# Patient Record
Sex: Male | Born: 1945 | Race: White | Hispanic: No | Marital: Married | Smoking: Never smoker
Health system: Southern US, Community
[De-identification: ages and names within clinical notes are randomized; demographics above are authoritative.]

## PROBLEM LIST (undated history)

## (undated) DIAGNOSIS — H409 Unspecified glaucoma: Secondary | ICD-10-CM

## (undated) DIAGNOSIS — E785 Hyperlipidemia, unspecified: Secondary | ICD-10-CM

## (undated) DIAGNOSIS — C801 Malignant (primary) neoplasm, unspecified: Secondary | ICD-10-CM

## (undated) DIAGNOSIS — E119 Type 2 diabetes mellitus without complications: Secondary | ICD-10-CM

## (undated) DIAGNOSIS — E789 Disorder of lipoprotein metabolism, unspecified: Secondary | ICD-10-CM

## (undated) HISTORY — DX: Hyperlipidemia, unspecified: E78.5

## (undated) HISTORY — DX: Disorder of lipoprotein metabolism, unspecified: E78.9

## (undated) HISTORY — DX: Unspecified glaucoma: H40.9

---

## 2008-05-09 ENCOUNTER — Emergency Department (HOSPITAL_COMMUNITY): Admission: EM | Admit: 2008-05-09 | Discharge: 2008-05-09 | Payer: Self-pay | Admitting: Emergency Medicine

## 2009-11-01 ENCOUNTER — Encounter: Admission: RE | Admit: 2009-11-01 | Discharge: 2009-11-01 | Payer: Self-pay | Admitting: Internal Medicine

## 2010-02-17 ENCOUNTER — Encounter: Payer: Self-pay | Admitting: Orthopedic Surgery

## 2010-05-08 LAB — URINALYSIS, ROUTINE W REFLEX MICROSCOPIC
Bilirubin Urine: NEGATIVE
Ketones, ur: NEGATIVE mg/dL
Specific Gravity, Urine: 1.025 (ref 1.005–1.030)
Urobilinogen, UA: 0.2 mg/dL (ref 0.0–1.0)

## 2010-05-08 LAB — POCT I-STAT, CHEM 8
BUN: 22 mg/dL (ref 6–23)
Hemoglobin: 15 g/dL (ref 13.0–17.0)
Potassium: 4.3 mEq/L (ref 3.5–5.1)
Sodium: 135 mEq/L (ref 135–145)
TCO2: 26 mmol/L (ref 0–100)

## 2010-05-08 LAB — URINE MICROSCOPIC-ADD ON

## 2011-08-08 ENCOUNTER — Telehealth: Payer: Self-pay

## 2011-08-08 NOTE — Telephone Encounter (Signed)
[facility] STATES THEY HAVE BEEN TRYING TO GET A REFILL ON PT'S CIALIS, PT WAS TRANSFERRING FROM [facility] TO THEM AND THEY WILL TAKE A VERBAL PLEASE CALL [phone]    [facility] AT [phone]

## 2011-08-08 NOTE — Telephone Encounter (Signed)
Ok to fill Cialis 10 mg #12. Needs office visit - due for physical

## 2011-08-08 NOTE — Telephone Encounter (Signed)
Chart is at nurses station for review 

## 2011-08-09 MED ORDER — TADALAFIL 10 MG PO TABS: 10.0000 mg | ORAL_TABLET | Freq: Every day | ORAL | Status: DC | PRN

## 2011-08-09 NOTE — Telephone Encounter (Signed)
RX sent in

## 2011-11-09 ENCOUNTER — Other Ambulatory Visit: Payer: Self-pay | Admitting: Internal Medicine

## 2011-11-10 NOTE — Telephone Encounter (Signed)
Patients chart is at the nurses station in the pa pool pile.  [facility] [id number]

## 2011-12-29 ENCOUNTER — Encounter: Payer: Self-pay | Admitting: Internal Medicine

## 2012-01-26 ENCOUNTER — Encounter: Payer: Self-pay | Admitting: Internal Medicine

## 2012-02-27 ENCOUNTER — Telehealth: Payer: Self-pay

## 2012-02-27 NOTE — Telephone Encounter (Signed)
Pts wife Jeanella Flattery is calling to request pt's liptitor 20 mg. [facility] [phone]

## 2012-02-28 NOTE — Telephone Encounter (Signed)
The patient hasn't been seen here in more than 1 year, so he needs an office visit.

## 2012-02-28 NOTE — Telephone Encounter (Signed)
Left message to return call 

## 2012-03-01 MED ORDER — ATORVASTATIN CALCIUM 40 MG PO TABS: 40.0000 mg | ORAL_TABLET | Freq: Every day | ORAL | Status: DC

## 2012-03-01 NOTE — Telephone Encounter (Signed)
Patients appt is scheduled for March. Was RS from Dec per Dr Perrin Maltese this is sent in for him. Wife advised.

## 2012-03-29 ENCOUNTER — Encounter: Payer: Self-pay | Admitting: Internal Medicine

## 2012-03-29 ENCOUNTER — Ambulatory Visit (INDEPENDENT_AMBULATORY_CARE_PROVIDER_SITE_OTHER): Payer: Medicare Other | Admitting: Internal Medicine

## 2012-03-29 ENCOUNTER — Ambulatory Visit: Payer: Medicare Other

## 2012-03-29 VITALS — BP 122/81 | HR 70 | Temp 97.6°F | Resp 16 | Ht 67.0 in | Wt 197.0 lb

## 2012-03-29 DIAGNOSIS — R748 Abnormal levels of other serum enzymes: Secondary | ICD-10-CM

## 2012-03-29 DIAGNOSIS — E785 Hyperlipidemia, unspecified: Secondary | ICD-10-CM

## 2012-03-29 DIAGNOSIS — E789 Disorder of lipoprotein metabolism, unspecified: Secondary | ICD-10-CM | POA: Insufficient documentation

## 2012-03-29 DIAGNOSIS — H409 Unspecified glaucoma: Secondary | ICD-10-CM

## 2012-03-29 DIAGNOSIS — R079 Chest pain, unspecified: Secondary | ICD-10-CM

## 2012-03-29 DIAGNOSIS — Z0000 Encounter for general adult medical examination without abnormal findings: Secondary | ICD-10-CM

## 2012-03-29 HISTORY — DX: Disorder of lipoprotein metabolism, unspecified: E78.9

## 2012-03-29 LAB — CBC
MCV: 87.8 fL (ref 78.0–100.0)
Platelets: 254 10*3/uL (ref 150–400)
RDW: 13.3 % (ref 11.5–15.5)
WBC: 6.2 10*3/uL (ref 4.0–10.5)

## 2012-03-29 LAB — COMPREHENSIVE METABOLIC PANEL
ALT: 67 U/L — ABNORMAL HIGH (ref 0–53)
CO2: 26 mEq/L (ref 19–32)
Calcium: 9.8 mg/dL (ref 8.4–10.5)
Chloride: 100 mEq/L (ref 96–112)
Potassium: 4.6 mEq/L (ref 3.5–5.3)
Sodium: 136 mEq/L (ref 135–145)
Total Protein: 6.9 g/dL (ref 6.0–8.3)

## 2012-03-29 LAB — POCT URINALYSIS DIPSTICK
Leukocytes, UA: NEGATIVE
Protein, UA: NEGATIVE
Urobilinogen, UA: 0.2

## 2012-03-29 LAB — LIPID PANEL
LDL Cholesterol: 120 mg/dL — ABNORMAL HIGH (ref 0–99)
Triglycerides: 180 mg/dL — ABNORMAL HIGH (ref <150)

## 2012-03-29 LAB — POCT UA - MICROSCOPIC ONLY
WBC, Ur, HPF, POC: NEGATIVE
Yeast, UA: NEGATIVE

## 2012-03-29 MED ORDER — TADALAFIL 10 MG PO TABS: 10.0000 mg | ORAL_TABLET | Freq: Every day | ORAL | Status: DC | PRN

## 2012-03-29 MED ORDER — ATORVASTATIN CALCIUM 40 MG PO TABS: 40.0000 mg | ORAL_TABLET | Freq: Every day | ORAL | Status: DC

## 2012-03-29 NOTE — Progress Notes (Signed)
  Subjective:    Patient ID: Nathan Mora, male    DOB: [birth year], 67 y.o.   MRN: [id number]  HPI CPE Fhx of MI and migraines Has hyperlipidemia on lipitor   Review of Systems  Constitutional: Negative.   HENT: Negative.   Eyes: Positive for visual disturbance.  Respiratory: Positive for chest tightness.   Cardiovascular: Negative.   Gastrointestinal: Negative.   Genitourinary: Negative.   Musculoskeletal: Negative.   Neurological: Negative.   Hematological: Negative.   Psychiatric/Behavioral: Negative.        Objective:   Physical Exam  Constitutional: He is oriented to person, place, and time. He appears well-developed and well-nourished.  HENT:  Right Ear: External ear normal.  Left Ear: External ear normal.  Nose: Nose normal.  Mouth/Throat: Oropharynx is clear and moist.  Eyes: Conjunctivae and EOM are normal. Pupils are equal, round, and reactive to light. No scleral icterus.  Neck: Normal range of motion. Neck supple. No tracheal deviation present. No thyromegaly present.  Cardiovascular: Normal rate, regular rhythm and normal heart sounds.   No murmur heard. Pulmonary/Chest: Effort normal and breath sounds normal.  Abdominal: Bowel sounds are normal. He exhibits no mass. There is no tenderness.  Genitourinary: Rectum normal, prostate normal and penis normal.  Musculoskeletal: Normal range of motion.  Lymphadenopathy:    He has no cervical adenopathy.  Neurological: He is alert and oriented to person, place, and time. No cranial nerve deficit. He exhibits normal muscle tone. Coordination normal.  Skin: Skin is warm and dry.  Psychiatric: He has a normal mood and affect. His behavior is normal. Judgment and thought content normal.   ekg nl CXR [facility] reading (PRIMARY) by  Dr Perrin Maltese.  Results for orders placed in visit on 03/29/12  POCT URINALYSIS DIPSTICK      Result Value Range   Color, UA yellow     Clarity, UA clear     Glucose, UA neg     Bilirubin, UA  neg     Ketones, UA neg     Spec Grav, UA 1.010     Blood, UA neg     pH, UA 6.0     Protein, UA neg     Urobilinogen, UA 0.2     Nitrite, UA neg     Leukocytes, UA Negative    POCT UA - MICROSCOPIC ONLY      Result Value Range   WBC, Ur, HPF, POC neg     RBC, urine, microscopic neg     Bacteria, U Microscopic neg     Mucus, UA neg     Epithelial cells, urine per micros 0-2     Crystals, Ur, HPF, POC neg     Casts, Ur, LPF, POC neg     Yeast, UA neg            Assessment & Plan:  Chest pressure at night/RF to [facility] cardiology for CAD eval

## 2012-03-29 NOTE — Patient Instructions (Addendum)
DASH Diet The DASH diet stands for "Dietary Approaches to Stop Hypertension." It is a healthy eating plan that has been shown to reduce high blood pressure (hypertension) in as little as 14 days, while also possibly providing other significant health benefits. These other health benefits include reducing the risk of breast cancer after menopause and reducing the risk of type 2 diabetes, heart disease, colon cancer, and stroke. Health benefits also include weight loss and slowing kidney failure in patients with chronic kidney disease.  DIET GUIDELINES  Limit salt (sodium). Your diet should contain less than 1500 mg of sodium daily.  Limit refined or processed carbohydrates. Your diet should include mostly whole grains. Desserts and added sugars should be used sparingly.  Include small amounts of heart-healthy fats. These types of fats include nuts, oils, and tub margarine. Limit saturated and trans fats. These fats have been shown to be harmful in the body. CHOOSING FOODS  The following food groups are based on a 2000 calorie diet. See your Registered Dietitian for individual calorie needs. Grains and Grain Products (6 to 8 servings daily)  Eat More Often: Whole-wheat bread, brown rice, whole-grain or wheat pasta, quinoa, popcorn without added fat or salt (air popped).  Eat Less Often: White bread, white pasta, white rice, cornbread. Vegetables (4 to 5 servings daily)  Eat More Often: Fresh, frozen, and canned vegetables. Vegetables may be raw, steamed, roasted, or grilled with a minimal amount of fat.  Eat Less Often/Avoid: Creamed or fried vegetables. Vegetables in a cheese sauce. Fruit (4 to 5 servings daily)  Eat More Often: All fresh, canned (in natural juice), or frozen fruits. Dried fruits without added sugar. One hundred percent fruit juice ( cup [237 mL] daily).  Eat Less Often: Dried fruits with added sugar. Canned fruit in light or heavy syrup. [employer], Fish, and Poultry (2  servings or less daily. One serving is 3 to 4 oz [85-114 g]).  Eat More Often: Ninety percent or leaner ground beef, tenderloin, sirloin. Round cuts of beef, chicken breast, [place] breast. All fish. Grill, bake, or broil your meat. Nothing should be fried.  Eat Less Often/Avoid: Fatty cuts of meat, [place], or chicken leg, thigh, or wing. Fried cuts of meat or fish. Dairy (2 to 3 servings)  Eat More Often: Low-fat or fat-free milk, low-fat plain or light yogurt, reduced-fat or part-skim cheese.  Eat Less Often/Avoid: Milk (whole, 2%).Whole milk yogurt. Full-fat cheeses. Nuts, Seeds, and Legumes (4 to 5 servings per week)  Eat More Often: All without added salt.  Eat Less Often/Avoid: Salted nuts and seeds, canned beans with added salt. Fats and Sweets (limited)  Eat More Often: Vegetable oils, tub margarines without trans fats, sugar-free gelatin. Mayonnaise and salad dressings.  Eat Less Often/Avoid: Coconut oils, palm oils, butter, stick margarine, cream, half and half, cookies, candy, pie. FOR MORE INFORMATION The Dash Diet Eating Plan: www.dashdiet.org Document Released: 01/02/2011 Document Revised: 04/07/2011 Document Reviewed: 01/02/2011 [facility] Patient Information 2013 [place], [place]. Coronary Artery Disease, Risk Factors Research has shown that the risk of developing coronary artery disease (CAD) and having a heart attack increases with each factor you have. RISK FACTORS YOU CANNOT CHANGE  Your age. Your risk goes up as you get older. Most heart attacks happen to people over the age of 52.  Gender. Men have a greater risk of heart attack than women, and they have attacks earlier in life. However, women are more likely to die from a heart attack.  Heredity. Children  of parents with heart disease are more likely to develop it themselves.  Race. African Americans and other ethnic groups have a higher risk, possibly because of high blood pressure, a tendency toward obesity,  and diabetes.  Your family. Most people with a strong family history of heart disease have one or more other risk factors. RISK FACTORS YOU CAN CHANGE  Exposure to tobacco smoke. Even secondhand smoke greatly increases the risk for heart disease.  High blood cholesterol may be lowered with changes in diet, activity, and medicines.  High blood pressure makes the heart work harder. This causes the heart muscles to become thick and, eventually, weaker. It also increases your risk of stroke, heart attack, and kidney or heart failure.  Physical inactivity is a risk factor for CAD. Regular physical activity helps prevent heart and blood vessel disease. Exercise helps control blood cholesterol, diabetes, obesity, and it may help lower blood pressure in some people.  Excess body fat, especially belly fat, increases the risk of heart disease and stroke even if there are no other risk factors. Excess weight increases the heart's workload and raises blood pressure and blood cholesterol.  Diabetes seriously increases your risk of developing CAD. If you have diabetes, you should work with your caregiver to manage it and control other risk factors. OTHER RISK FACTORS FOR CAD  How you respond to stress.  Drinking too much alcohol may raise blood pressure, cause heart failure, and lead to stroke.  Total cholesterol greater than 200 milligrams.  HDL (good) cholesterol less than 40 milligrams. HDL helps keep cholesterol from building up in the walls of the arteries. PREVENTING CAD  Maintain a healthy weight.  Exercise or do physical activity.  Eat a heart-healthy diet low in fat and salt and high in fiber.  Control your blood pressure to keep it below 120 over 80.  Keep your cholesterol at a level that lowers your risk.  Manage diabetes if you have it.  Stop smoking.  Learn how to manage stress. HEART SMART SUBSTITUTIONS  Instead of whole or 2% milk and cream, use skim milk.  Instead of  fried foods, eat baked, steamed, boiled, broiled, or microwaved foods.  Instead of lard, butter, palm and coconut oils, cook with unsaturated vegetable oils, such as corn, olive, canola, safflower, sesame, soybean, sunflower, or peanut.  Instead of fatty cuts of meat, eat lean cuts of meat or cut off the fatty parts.  Instead of 1 whole egg in recipes, use 2 egg whites.  Instead of sauces, butter, and salt, season vegetables with herbs and spices.  Instead of regular hard and processed cheeses, eat low-fat, low-sodium cheeses.  Instead of salted potato chips, choose low-fat, unsalted tortilla and potato chips and unsalted pretzels and popcorn.  Instead of sour cream and mayonnaise, use plain low-fat yogurt, low-fat cottage cheese, or low-fat or "light" sour cream. FOR MORE INFORMATION  National Heart Lung and Blood Institute: https://nielsen.com/ American Heart Association: PopSteam.is Document Released: 04/05/2003 Document Revised: 04/07/2011 Document Reviewed: 03/31/2007 [facility] Patient Information 2013 [place], [place].

## 2012-03-30 LAB — HEPATITIS C ANTIBODY: HCV Ab: NEGATIVE

## 2012-04-11 ENCOUNTER — Encounter: Payer: Self-pay | Admitting: *Deleted

## 2012-04-22 ENCOUNTER — Encounter: Payer: Self-pay | Admitting: Cardiovascular Disease

## 2012-04-22 ENCOUNTER — Encounter: Payer: Self-pay | Admitting: Internal Medicine

## 2012-04-22 ENCOUNTER — Encounter: Payer: Self-pay | Admitting: *Deleted

## 2012-04-22 DIAGNOSIS — H409 Unspecified glaucoma: Secondary | ICD-10-CM | POA: Insufficient documentation

## 2012-04-22 DIAGNOSIS — E785 Hyperlipidemia, unspecified: Secondary | ICD-10-CM | POA: Insufficient documentation

## 2012-04-23 ENCOUNTER — Encounter: Payer: Self-pay | Admitting: Cardiovascular Disease

## 2012-04-23 ENCOUNTER — Ambulatory Visit (INDEPENDENT_AMBULATORY_CARE_PROVIDER_SITE_OTHER)
Admission: RE | Admit: 2012-04-23 | Discharge: 2012-04-23 | Disposition: A | Discharge status: Home / Self Care | Payer: Self-pay | Source: Ambulatory Visit | Attending: Cardiovascular Disease | Admitting: Cardiovascular Disease

## 2012-04-23 ENCOUNTER — Ambulatory Visit (INDEPENDENT_AMBULATORY_CARE_PROVIDER_SITE_OTHER): Payer: Self-pay | Admitting: Cardiovascular Disease

## 2012-04-23 VITALS — BP 131/72 | HR 66 | Wt 197.0 lb

## 2012-04-23 DIAGNOSIS — Z8249 Family history of ischemic heart disease and other diseases of the circulatory system: Secondary | ICD-10-CM

## 2012-04-23 NOTE — Progress Notes (Signed)
Patient ID: Nathan Mora, male   DOB: 1945/04/05, 67 y.o.   MRN: [id number] 67 yo with elevated lipids and family history of CAD.  Referred by Dr Perrin Maltese. Last 6 months some dyspnea at night when he lays down. No exertional dyspnea with exercise. No chest pain. No history of CAD.  Last LDL 121 3/14 on 40 mg of lipitor.  Has not had a stress test.  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    ROS: Denies fever, malais, weight loss, blurry vision, decreased visual acuity, cough, sputum, SOB, hemoptysis, pleuritic pain, palpitaitons, heartburn, abdominal pain, melena, lower extremity edema, claudication, or rash.  All other systems reviewed and negative   General: Affect appropriate Healthy:  appears stated age HEENT: normal Neck supple with no adenopathy JVP normal no bruits no thyromegaly Lungs clear with no wheezing and good diaphragmatic motion Heart:  S1/S2 no murmur,rub, gallop or click PMI normal Abdomen: benighn, BS positve, no tenderness, no AAA no bruit.  No HSM or HJR Distal pulses intact with no bruits No edema Neuro non-focal Skin warm and dry No muscular weakness  Medications Current Outpatient Prescriptions  Medication Sig Dispense Refill  . atorvastatin (LIPITOR) 40 MG tablet Take 1 tablet (40 mg total) by mouth daily. Needs office visit  90 tablet  3  . brimonidine-timolol (COMBIGAN) 0.2-0.5 % ophthalmic solution Place 1 drop into both eyes every 12 (twelve) hours.      Marland Kitchen latanoprost (XALATAN) 0.005 % ophthalmic solution 1 drop as directed.      . tadalafil (CIALIS) 10 MG tablet Take 1 tablet (10 mg total) by mouth daily as needed for erectile dysfunction.  20 tablet  3   No current facility-administered medications for this visit.    Allergies Review of patient's allergies indicates no known allergies.  Family History: Family History  Problem Relation Age  of Onset  . Cancer Father     lung  . Heart attack Brother   . Hypertension Brother   . Hypertension Brother     Social History: History   Social History  . Marital Status: Married    Spouse Name: N/A    Number of Children: N/A  . Years of Education: N/A   Occupational History  . Not on file.   Social History Main Topics  . Smoking status: Never Smoker   . Smokeless tobacco: Not on file  . Alcohol Use: Yes  . Drug Use: No  . Sexually Active: Yes   Other Topics Concern  . Not on file   Social History Narrative  . No narrative on file    Electrocardiogram:  04/22/12  NSR rate 62 normal   Assessment and Plan

## 2012-04-23 NOTE — Assessment & Plan Note (Signed)
Calcium score today If 0 no change in Rx.  If positive will change cholesterol meds and do ETT

## 2012-04-23 NOTE — Assessment & Plan Note (Signed)
Should change to crestor or add zetia to lipitor if calcium score not zero.  Target LDL less than 100 if CAD present and would not use 80 mg of lipitor due to side effect profile

## 2012-04-23 NOTE — Patient Instructions (Signed)
Your physician recommends that you schedule a follow-up appointment in: PENDING CALCIUM SCORE Your physician recommends that you continue on your current medications as directed. Please refer to the Current Medication list given to you today.  CALCIUM SCORE  TODAY

## 2012-11-29 DIAGNOSIS — H401130 Primary open-angle glaucoma, bilateral, stage unspecified: Secondary | ICD-10-CM | POA: Insufficient documentation

## 2012-11-29 DIAGNOSIS — H02839 Dermatochalasis of unspecified eye, unspecified eyelid: Secondary | ICD-10-CM | POA: Insufficient documentation

## 2013-03-24 ENCOUNTER — Other Ambulatory Visit: Payer: Self-pay | Admitting: Internal Medicine

## 2013-09-28 ENCOUNTER — Telehealth: Payer: Self-pay | Admitting: *Deleted

## 2013-09-28 NOTE — Telephone Encounter (Signed)
Phoned patient to schedule Annual Medicare Wellness Exam [facility]) and since patient was already previously advised that Dr. Elder Cyphers is no longer seeing SCHEDULED patients, he is requesting that Dr. Elder Cyphers provide a provider recommendation-who within [facility], that is taking new patients, would Dr. Elder Cyphers recommend.  Advised patient I would send request to Dr. Elder Cyphers & get back in contact with him to schedule [facility] as soon as recommendation received.  Please advise.

## 2013-10-04 NOTE — Telephone Encounter (Signed)
Have him call me , he is a personal friend.

## 2013-10-04 NOTE — Telephone Encounter (Signed)
Good afternoon.  I hope you had a relaxing Labor day weekend...  I just wanted to touch base and follow up on previous message regarding Nathan Mora request for a PCP recommendation from you.  Thank you & I look forward to hearing from you.

## 2013-10-05 NOTE — Telephone Encounter (Signed)
Gave pt cell phone number. Pt states he will call as soon as possible.

## 2013-10-06 NOTE — Telephone Encounter (Signed)
Dr. Elder Cyphers- please advise if pt was to schedule an appt with another provider or if he will be coming into the clinic during your office hours.  Hinton Dyer- Absolutely!

## 2013-10-06 NOTE — Telephone Encounter (Signed)
Nathan Mora, if you don't mind, please let me know if patient comes in to be seen and has his wellness exam so I can close his PAF (healthcare gaps).  Thanks!

## 2013-10-11 ENCOUNTER — Ambulatory Visit (INDEPENDENT_AMBULATORY_CARE_PROVIDER_SITE_OTHER): Payer: Medicare HMO | Admitting: Internal Medicine

## 2013-10-11 VITALS — BP 110/68 | HR 56 | Temp 97.9°F | Resp 16 | Ht 67.0 in | Wt 183.2 lb

## 2013-10-11 DIAGNOSIS — Z23 Encounter for immunization: Secondary | ICD-10-CM

## 2013-10-11 DIAGNOSIS — Z7189 Other specified counseling: Secondary | ICD-10-CM

## 2013-10-11 DIAGNOSIS — H409 Unspecified glaucoma: Secondary | ICD-10-CM

## 2013-10-11 DIAGNOSIS — Z0000 Encounter for general adult medical examination without abnormal findings: Secondary | ICD-10-CM

## 2013-10-11 DIAGNOSIS — E785 Hyperlipidemia, unspecified: Secondary | ICD-10-CM

## 2013-10-11 DIAGNOSIS — N529 Male erectile dysfunction, unspecified: Secondary | ICD-10-CM

## 2013-10-11 LAB — COMPLETE METABOLIC PANEL WITH GFR
ALBUMIN: 4.6 g/dL (ref 3.5–5.2)
ALT: 34 U/L (ref 0–53)
AST: 24 U/L (ref 0–37)
Alkaline Phosphatase: 54 U/L (ref 39–117)
BUN: 18 mg/dL (ref 6–23)
CALCIUM: 9.2 mg/dL (ref 8.4–10.5)
CHLORIDE: 104 meq/L (ref 96–112)
CO2: 29 meq/L (ref 19–32)
CREATININE: 0.81 mg/dL (ref 0.50–1.35)
GFR, Est African American: >89 mL/min
GFR, Est Non African American: >89 mL/min
Glucose, Bld: 100 mg/dL — ABNORMAL HIGH (ref 70–99)
POTASSIUM: 4.6 meq/L (ref 3.5–5.3)
SODIUM: 139 meq/L (ref 135–145)
TOTAL PROTEIN: 6.5 g/dL (ref 6.0–8.3)
Total Bilirubin: 1.2 mg/dL (ref 0.2–1.2)

## 2013-10-11 LAB — LIPID PANEL
CHOL/HDL RATIO: 3.1 ratio
CHOLESTEROL: 175 mg/dL (ref 0–200)
HDL: 57 mg/dL (ref >39)
LDL CALC: 106 mg/dL — AB (ref 0–99)
Triglycerides: 61 mg/dL (ref <150)
VLDL: 12 mg/dL (ref 0–40)

## 2013-10-11 LAB — POCT CBC
GRANULOCYTE PERCENT: 59.2 % (ref 37–80)
HCT, POC: 46 % (ref 43.5–53.7)
Hemoglobin: 15.2 g/dL (ref 14.1–18.1)
LYMPH, POC: 2.4 (ref 0.6–3.4)
MCH, POC: 31.2 pg (ref 27–31.2)
MCHC: 33.1 g/dL (ref 31.8–35.4)
MCV: 94.1 fL (ref 80–97)
MID (cbc): 0.2 (ref 0–0.9)
MPV: 8.1 fL (ref 0–99.8)
POC GRANULOCYTE: 3.8 (ref 2–6.9)
POC LYMPH %: 37.6 % (ref 10–50)
POC MID %: 3.2 % (ref 0–12)
Platelet Count, POC: 207 10*3/uL (ref 142–424)
RBC: 4.89 M/uL (ref 4.69–6.13)
RDW, POC: 13.4 %
WBC: 6.4 10*3/uL (ref 4.6–10.2)

## 2013-10-11 LAB — POCT URINALYSIS DIPSTICK
Bilirubin, UA: NEGATIVE
Blood, UA: NEGATIVE
Glucose, UA: NEGATIVE
Ketones, UA: NEGATIVE
LEUKOCYTES UA: NEGATIVE
NITRITE UA: NEGATIVE
PH UA: 6
PROTEIN UA: NEGATIVE
Spec Grav, UA: 1.025
UROBILINOGEN UA: 1

## 2013-10-11 LAB — POCT UA - MICROSCOPIC ONLY
Bacteria, U Microscopic: NEGATIVE
CASTS, UR, LPF, POC: NEGATIVE
Crystals, Ur, HPF, POC: NEGATIVE
RBC, urine, microscopic: NEGATIVE
WBC, UR, HPF, POC: NEGATIVE
YEAST UA: NEGATIVE

## 2013-10-11 LAB — TSH: TSH: 0.997 u[IU]/mL (ref 0.350–4.500)

## 2013-10-11 MED ORDER — ATORVASTATIN CALCIUM 40 MG PO TABS: 40.0000 mg | ORAL_TABLET | Freq: Every day | ORAL | Status: DC

## 2013-10-11 MED ORDER — TADALAFIL 10 MG PO TABS: 10.0000 mg | ORAL_TABLET | Freq: Every day | ORAL | Status: DC | PRN

## 2013-10-11 NOTE — Progress Notes (Signed)
   Subjective:    Patient ID: Nathan Mora, male    DOB: [birth year], 68 y.o.   MRN: [id number]  HPI  68 year old caucasian male presents for complete physical exam today with Dr. Lou Miner. Pt states he does not need surgery for glaucoma/ eyes per specialist he has seen. Last colonoscopy approximately 10 years ago and pt is refusing to have another.  Lipids are controlled with lipitor. Has lost 20lbs with diet and exercise.     Review of Systems  HENT: Negative.   Eyes: Positive for visual disturbance.  Respiratory: Negative.   Cardiovascular: Negative.   Gastrointestinal: Negative.   Endocrine: Negative.   Genitourinary: Negative.   Musculoskeletal: Negative.   Skin: Negative.   Allergic/Immunologic: Negative.   Neurological: Negative.   Hematological: Negative.   Psychiatric/Behavioral: Negative.        Objective:   Physical Exam  Constitutional: He is oriented to person, place, and time. He appears well-developed and well-nourished. No distress.  HENT:  Head: Normocephalic and atraumatic.  Right Ear: External ear normal.  Left Ear: External ear normal.  Nose: Nose normal.  Mouth/Throat: Oropharynx is clear and moist.  Eyes: Conjunctivae and EOM are normal. Pupils are equal, round, and reactive to light. Right eye exhibits no discharge. No scleral icterus.  Neck: Normal range of motion. Neck supple. No tracheal deviation present. No thyromegaly present.  Cardiovascular: Normal rate, regular rhythm, normal heart sounds and intact distal pulses.  Exam reveals no gallop.   No murmur heard. Pulmonary/Chest: Effort normal and breath sounds normal.  Abdominal: Soft. Bowel sounds are normal. He exhibits no mass. There is no tenderness.  Genitourinary: Rectum normal, prostate normal and penis normal. No penile tenderness.  Musculoskeletal: Normal range of motion. He exhibits no edema and no tenderness.  Neurological: He is alert and oriented to person, place, and time. He has  normal reflexes. No cranial nerve deficit. He exhibits normal muscle tone. Coordination normal.  Skin: No rash noted.  Psychiatric: He has a normal mood and affect. His behavior is normal. Judgment and thought content normal.   EKG normal       Assessment & Plan:  Flu vaccine and Prevnar vaccine RF lipitor

## 2013-10-11 NOTE — Patient Instructions (Addendum)
Calorie Counting for Weight Loss Calories are energy you get from the things you eat and drink. Your body uses this energy to keep you going throughout the day. The number of calories you eat affects your weight. When you eat more calories than your body needs, your body stores the extra calories as fat. When you eat fewer calories than your body needs, your body burns fat to get the energy it needs. Calorie counting means keeping track of how many calories you eat and drink each day. If you make sure to eat fewer calories than your body needs, you should lose weight. In order for calorie counting to work, you will need to eat the number of calories that are right for you in a day to lose a healthy amount of weight per week. A healthy amount of weight to lose per week is usually 1-2 lb (0.5-0.9 kg). A dietitian can determine how many calories you need in a day and give you suggestions on how to reach your calorie goal.  WHAT IS MY MY PLAN? My goal is to have __________ calories per day.  If I have this many calories per day, I should lose around __________ pounds per week. WHAT DO I NEED TO KNOW ABOUT CALORIE COUNTING? In order to meet your daily calorie goal, you will need to:  Find out how many calories are in each food you would like to eat. Try to do this before you eat.  Decide how much of the food you can eat.  Write down what you ate and how many calories it had. Doing this is called keeping a food log. WHERE DO I FIND CALORIE INFORMATION? The number of calories in a food can be found on a Nutrition Facts label. Note that all the information on a label is based on a specific serving of the food. If a food does not have a Nutrition Facts label, try to look up the calories online or ask your dietitian for help. HOW DO I DECIDE HOW MUCH TO EAT? To decide how much of the food you can eat, you will need to consider both the number of calories in one serving and the size of one serving. This  information can be found on the Nutrition Facts label. If a food does not have a Nutrition Facts label, look up the information online or ask your dietitian for help. Remember that calories are listed per serving. If you choose to have more than one serving of a food, you will have to multiply the calories per serving by the amount of servings you plan to eat. For example, the label on a package of bread might say that a serving size is 1 slice and that there are 90 calories in a serving. If you eat 1 slice, you will have eaten 90 calories. If you eat 2 slices, you will have eaten 180 calories. HOW DO I KEEP A FOOD LOG? After each meal, record the following information in your food log:  What you ate.  How much of it you ate.  How many calories it had.  Then, add up your calories. Keep your food log near you, such as in a small notebook in your pocket. Another option is to use a mobile app or website. Some programs will calculate calories for you and show you how many calories you have left each time you add an item to the log. WHAT ARE SOME CALORIE COUNTING TIPS?  Use your calories on foods  and drinks that will fill you up and not leave you hungry. Some examples of this include foods like nuts and nut butters, vegetables, lean proteins, and high-fiber foods (more than 5 g fiber per serving).  Eat nutritious foods and avoid empty calories. Empty calories are calories you get from foods or beverages that do not have many nutrients, such as candy and soda. It is better to have a nutritious high-calorie food (such as an avocado) than a food with few nutrients (such as a bag of chips).  Know how many calories are in the foods you eat most often. This way, you do not have to look up how many calories they have each time you eat them.  Look out for foods that may seem like low-calorie foods but are really high-calorie foods, such as baked goods, soda, and fat-free candy.  Pay attention to calories  in drinks. Drinks such as sodas, specialty coffee drinks, alcohol, and juices have a lot of calories yet do not fill you up. Choose low-calorie drinks like water and diet drinks.  Focus your calorie counting efforts on higher calorie items. Logging the calories in a garden salad that contains only vegetables is less important than calculating the calories in a milk shake.  Find a way of tracking calories that works for you. Get creative. Most people who are successful find ways to keep track of how much they eat in a day, even if they do not count every calorie. WHAT ARE SOME PORTION CONTROL TIPS?  Know how many calories are in a serving. This will help you know how many servings of a certain food you can have.  Use a measuring cup to measure serving sizes. This is helpful when you start out. With time, you will be able to estimate serving sizes for some foods.  Take some time to put servings of different foods on your favorite plates, bowls, and cups so you know what a serving looks like.  Try not to eat straight from a bag or box. Doing this can lead to overeating. Put the amount you would like to eat in a cup or on a plate to make sure you are eating the right portion.  Use smaller plates, glasses, and bowls to prevent overeating. This is a quick and easy way to practice portion control. If your plate is smaller, less food can fit on it.  Try not to multitask while eating, such as watching TV or using your computer. If it is time to eat, sit down at a table and enjoy your food. Doing this will help you to start recognizing when you are full. It will also make you more aware of what and how much you are eating. HOW CAN I CALORIE COUNT WHEN EATING OUT?  Ask for smaller portion sizes or child-sized portions.  Consider sharing an entree and sides instead of getting your own entree.  If you get your own entree, eat only half. Ask for a box at the beginning of your meal and put the rest of your  entree in it so you are not tempted to eat it.  Look for the calories on the menu. If calories are listed, choose the lower calorie options.  Choose dishes that include vegetables, fruits, whole grains, low-fat dairy products, and lean protein. Focusing on smart food choices from each of the 5 food groups can help you stay on track at restaurants.  Choose items that are boiled, broiled, grilled, or steamed.  Choose  water, milk, unsweetened iced tea, or other drinks without added sugars. If you want an alcoholic beverage, choose a lower calorie option. For example, a regular margarita can have up to 700 calories and a glass of wine has around 150.  Stay away from items that are buttered, battered, fried, or served with cream sauce. Items labeled "crispy" are usually fried, unless stated otherwise.  Ask for dressings, sauces, and syrups on the side. These are usually very high in calories, so do not eat much of them.  Watch out for salads. Many people think salads are a healthy option, but this is often not the case. Many salads come with bacon, fried chicken, lots of cheese, fried chips, and dressing. All of these items have a lot of calories. If you want a salad, choose a garden salad and ask for grilled meats or steak. Ask for the dressing on the side, or ask for olive oil and vinegar or lemon to use as dressing.  Estimate how many servings of a food you are given. For example, a serving of cooked rice is  cup or about the size of half a tennis ball or one cupcake wrapper. Knowing serving sizes will help you be aware of how much food you are eating at restaurants. The list below tells you how big or small some common portion sizes are based on everyday objects.  1 oz--4 stacked dice.  3 oz--1 deck of cards.  1 tsp--1 dice.  1 Tbsp-- a Ping-Pong ball.  2 Tbsp--1 Ping-Pong ball.   cup--1 tennis ball or 1 cupcake wrapper.  1 cup--1 baseball. Document Released: 01/13/2005 Document  Revised: 05/30/2013 Document Reviewed: 11/18/2012 [facility] Patient Information 2015 [place], [place]. This information is not intended to replace advice given to you by your health care provider. Make sure you discuss any questions you have with your health care provider. Immunization Schedule, Adult  Influenza vaccine.  All adults should be immunized every year.  All adults, including pregnant women and people with hives-only allergy to eggs can receive the inactivated influenza (IIV) vaccine.  Adults aged 18-49 years can receive the recombinant influenza (RIV) vaccine. The RIV vaccine does not contain any egg protein.  Adults aged 8 years or older can receive the standard-dose IIV or the high-dose IIV.  Tetanus, diphtheria, and acellular pertussis (Td, Tdap) vaccine.  Pregnant women should receive 1 dose of Tdap vaccine during each pregnancy. The dose should be obtained regardless of the length of time since the last dose. Immunization is preferred during the 27th to 36th week of gestation.  An adult who has not previously received Tdap or who does not know his or her vaccine status should receive 1 dose of Tdap. This initial dose should be followed by tetanus and diphtheria toxoids (Td) booster doses every 10 years.  Adults with an unknown or incomplete history of completing a 3-dose immunization series with Td-containing vaccines should begin or complete a primary immunization series including a Tdap dose.  Adults should receive a Td booster every 10 years.  Varicella vaccine.  An adult without evidence of immunity to varicella should receive 2 doses or a second dose if he or she has previously received 1 dose.  Pregnant females who do not have evidence of immunity should receive the first dose after pregnancy. This first dose should be obtained before leaving the health care facility. The second dose should be obtained 4-8 weeks after the first dose.  Human papillomavirus (HPV)  vaccine.  Females aged  13-26 years who have not received the vaccine previously should obtain the 3-dose series.  The vaccine is not recommended for use in pregnant females. However, pregnancy testing is not needed before receiving a dose. If a male is found to be pregnant after receiving a dose, no treatment is needed. In that case, the remaining doses should be delayed until after the pregnancy.  Males aged 25-21 years who have not received the vaccine previously should receive the 3-dose series. Males aged 22-26 years may be immunized.  Immunization is recommended through the age of 74 years for any male who has sex with males and did not get any or all doses earlier.  Immunization is recommended for any person with an immunocompromised condition through the age of 43 years if he or she did not get any or all doses earlier.  During the 3-dose series, the second dose should be obtained 4-8 weeks after the first dose. The third dose should be obtained 24 weeks after the first dose and 16 weeks after the second dose.  Zoster vaccine.  One dose is recommended for adults aged 15 years or older unless certain conditions are present.  Measles, mumps, and rubella (MMR) vaccine.  Adults born before [birth year] generally are considered immune to measles and mumps.  Adults born in [birth year] or later should have 1 or more doses of MMR vaccine unless there is a contraindication to the vaccine or there is laboratory evidence of immunity to each of the three diseases.  A routine second dose of MMR vaccine should be obtained at least 28 days after the first dose for students attending postsecondary schools, health care workers, or international travelers.  People who received inactivated measles vaccine or an unknown type of measles vaccine during 1963-1967 should receive 2 doses of MMR vaccine.  People who received inactivated mumps vaccine or an unknown type of mumps vaccine before 1979 and are at high risk  for mumps infection should consider immunization with 2 doses of MMR vaccine.  For females of childbearing age, rubella immunity should be determined. If there is no evidence of immunity, females who are not pregnant should be vaccinated. If there is no evidence of immunity, females who are pregnant should delay immunization until after pregnancy.  Unvaccinated health care workers born before [birth year] who lack laboratory evidence of measles, mumps, or rubella immunity or laboratory confirmation of disease should consider measles and mumps immunization with 2 doses of MMR vaccine or rubella immunization with 1 dose of MMR vaccine.  Pneumococcal 13-valent conjugate (PCV13) vaccine.  When indicated, a person who is uncertain of his or her immunization history and has no record of immunization should receive the PCV13 vaccine.  An adult aged 3 years or older who has certain medical conditions and has not been previously immunized should receive 1 dose of PCV13 vaccine. This PCV13 should be followed with a dose of pneumococcal polysaccharide (PPSV23) vaccine. The PPSV23 vaccine dose should be obtained at least 8 weeks after the dose of PCV13 vaccine.  An adult aged 55 years or older who has certain medical conditions and previously received 1 or more doses of PPSV23 vaccine should receive 1 dose of PCV13. The PCV13 vaccine dose should be obtained 1 or more years after the last PPSV23 vaccine dose.  Pneumococcal polysaccharide (PPSV23) vaccine.  When PCV13 is also indicated, PCV13 should be obtained first.  All adults aged 60 years and older should be immunized.  An adult younger than age 26 years who  has certain medical conditions should be immunized.  Any person who resides in a nursing home or long-term care facility should be immunized.  An adult smoker should be immunized.  People with an immunocompromised condition and certain other conditions should receive both PCV13 and PPSV23  vaccines.  People with human immunodeficiency virus (HIV) infection should be immunized as soon as possible after diagnosis.  Immunization during chemotherapy or radiation therapy should be avoided.  Routine use of PPSV23 vaccine is not recommended for American Indians, [place] Natives, or people younger than 65 years unless there are medical conditions that require PPSV23 vaccine.  When indicated, people who have unknown immunization and have no record of immunization should receive PPSV23 vaccine.  One-time revaccination 5 years after the first dose of PPSV23 is recommended for people aged 19-64 years who have chronic kidney failure, nephrotic syndrome, asplenia, or immunocompromised conditions.  People who received 1-2 doses of PPSV23 before age 87 years should receive another dose of PPSV23 vaccine at age 34 years or later if at least 5 years have passed since the previous dose.  Doses of PPSV23 are not needed for people immunized with PPSV23 at or after age 67 years.  Meningococcal vaccine.  Adults with asplenia or persistent complement component deficiencies should receive 2 doses of quadrivalent meningococcal conjugate (MenACWY-D) vaccine. The doses should be obtained at least 2 months apart.  Microbiologists working with certain meningococcal bacteria, [employer] recruits, people at risk during an outbreak, and people who travel to or live in countries with a high rate of meningitis should be immunized.  A first-year college student up through age 35 years who is living in a residence hall should receive a dose if he or she did not receive a dose on or after his or her 16th birthday.  Adults who have certain high-risk conditions should receive one or more doses of vaccine.  Hepatitis A vaccine.  Adults who wish to be protected from this disease, have certain high-risk conditions, work with hepatitis A-infected animals, work in hepatitis A research labs, or travel to or work in  countries with a high rate of hepatitis A should be immunized.  Adults who were previously unvaccinated and who anticipate close contact with an international adoptee during the first 60 days after arrival in the [place] from a country with a high rate of hepatitis A should be immunized.  Hepatitis B vaccine.  Adults who wish to be protected from this disease, have certain high-risk conditions, may be exposed to blood or other infectious body fluids, are household contacts or sex partners of hepatitis B positive people, are clients or workers in certain care facilities, or travel to or work in countries with a high rate of hepatitis B should be immunized.  Haemophilus influenzae type b (Hib) vaccine.  A previously unvaccinated person with asplenia or sickle cell disease or having a scheduled splenectomy should receive 1 dose of Hib vaccine.  Regardless of previous immunization, a recipient of a hematopoietic stem cell transplant should receive a 3-dose series 6-12 months after his or her successful transplant.  Hib vaccine is not recommended for adults with HIV infection. Document Released: 04/05/2003 Document Revised: 05/10/2012 Document Reviewed: 03/02/2012 [facility] Patient Information 2015 [place], [place]. This information is not intended to replace advice given to you by your health care provider. Make sure you discuss any questions you have with your health care provider.

## 2013-10-12 LAB — PSA, MEDICARE: PSA: 1.99 ng/mL (ref <=4.00)

## 2013-10-12 NOTE — Telephone Encounter (Signed)
I see that Mr. 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 Elder Cyphers to please close encounter (I realize it was just yesterday, by end of week will be great).  Thank you! Hinton Dyer

## 2013-10-17 NOTE — Telephone Encounter (Signed)
Phoned Mr. Fan regarding FOBT cards & he is planning to bring them by the office either tomorrow or by the end of this week.

## 2013-10-19 LAB — IFOBT (OCCULT BLOOD): IMMUNOLOGICAL FECAL OCCULT BLOOD TEST: NEGATIVE

## 2013-10-24 NOTE — Telephone Encounter (Signed)
FOBT cards received at the walk in clinic, processed & results in EMR labs tab.

## 2015-05-31 ENCOUNTER — Ambulatory Visit (INDEPENDENT_AMBULATORY_CARE_PROVIDER_SITE_OTHER): Payer: Medicare HMO

## 2015-05-31 ENCOUNTER — Encounter: Payer: Self-pay | Admitting: Podiatry

## 2015-05-31 ENCOUNTER — Ambulatory Visit (INDEPENDENT_AMBULATORY_CARE_PROVIDER_SITE_OTHER): Payer: Medicare HMO | Admitting: Podiatry

## 2015-05-31 VITALS — BP 135/80 | HR 52 | Resp 16

## 2015-05-31 DIAGNOSIS — B351 Tinea unguium: Secondary | ICD-10-CM | POA: Diagnosis not present

## 2015-05-31 DIAGNOSIS — M21619 Bunion of unspecified foot: Secondary | ICD-10-CM | POA: Diagnosis not present

## 2015-05-31 MED ORDER — TERBINAFINE HCL 250 MG PO TABS: ORAL_TABLET | ORAL | Status: DC

## 2015-05-31 NOTE — Progress Notes (Signed)
   Subjective:    Patient ID: Nathan Mora, male    DOB: September 09, 1945, 70 y.o.   MRN: [id number]  HPI    Review of Systems  All other systems reviewed and are negative.      Objective:   Physical Exam        Assessment & Plan:

## 2015-05-31 NOTE — Progress Notes (Signed)
Subjective:     Patient ID: Nathan Mora, male   DOB: Apr 24, 1945, 70 y.o.   MRN: [id number]  HPI patient states I have a bunion deformity right that's not painful and a lot of nails that are thick and have yellow discoloration on both feet. I was wondering about fixing the bunion   Review of Systems  All other systems reviewed and are negative.      Objective:   Physical Exam  Constitutional: He is oriented to person, place, and time.  Cardiovascular: Intact distal pulses.   Musculoskeletal: Normal range of motion.  Neurological: He is oriented to person, place, and time.  Skin: Skin is warm and dry.  Nursing note and vitals reviewed.  neurovascular status was found to be intact muscle strength adequate range of motion within normal limits with patient noted to have a hyperostosis medial aspect first metatarsal head right that is red when pressed but is not painful with keratotic tissue formation and nail disease bilateral with thickness of the underlying nailbeds 1 through 5 of both feet. 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 I do not recommend correction currently as the pain is not that severe but eventually we'll need to be taken care of. I then went ahead and discussed treatment for the nails and he would like treatment and I'm just going to place him on a pulse Lamisil therapy of 7 pills per months for 4 months and formula 3. He will reappoint as needed for further treatment  X-ray report indicates structural bunion deformity right with elevation of the intermetatarsal angle

## 2015-06-27 DIAGNOSIS — H401123 Primary open-angle glaucoma, left eye, severe stage: Secondary | ICD-10-CM | POA: Diagnosis not present

## 2015-06-27 DIAGNOSIS — H2513 Age-related nuclear cataract, bilateral: Secondary | ICD-10-CM | POA: Diagnosis not present

## 2015-06-27 DIAGNOSIS — H401111 Primary open-angle glaucoma, right eye, mild stage: Secondary | ICD-10-CM | POA: Diagnosis not present

## 2015-07-23 DIAGNOSIS — H401123 Primary open-angle glaucoma, left eye, severe stage: Secondary | ICD-10-CM | POA: Diagnosis not present

## 2015-07-23 DIAGNOSIS — H2513 Age-related nuclear cataract, bilateral: Secondary | ICD-10-CM | POA: Diagnosis not present

## 2015-07-23 DIAGNOSIS — H401111 Primary open-angle glaucoma, right eye, mild stage: Secondary | ICD-10-CM | POA: Diagnosis not present

## 2015-09-27 ENCOUNTER — Ambulatory Visit (INDEPENDENT_AMBULATORY_CARE_PROVIDER_SITE_OTHER): Payer: Medicare HMO | Admitting: Family Medicine

## 2015-09-27 ENCOUNTER — Encounter: Payer: Self-pay | Admitting: Family Medicine

## 2015-09-27 VITALS — BP 116/74 | HR 57 | Temp 98.2°F | Resp 18 | Ht 67.0 in | Wt 190.6 lb

## 2015-09-27 DIAGNOSIS — Z1211 Encounter for screening for malignant neoplasm of colon: Secondary | ICD-10-CM | POA: Diagnosis not present

## 2015-09-27 DIAGNOSIS — Z23 Encounter for immunization: Secondary | ICD-10-CM

## 2015-09-27 DIAGNOSIS — R69 Illness, unspecified: Secondary | ICD-10-CM | POA: Diagnosis not present

## 2015-09-27 DIAGNOSIS — E785 Hyperlipidemia, unspecified: Secondary | ICD-10-CM

## 2015-09-27 DIAGNOSIS — Z114 Encounter for screening for human immunodeficiency virus [HIV]: Secondary | ICD-10-CM

## 2015-09-27 DIAGNOSIS — Z7189 Other specified counseling: Secondary | ICD-10-CM

## 2015-09-27 DIAGNOSIS — H409 Unspecified glaucoma: Secondary | ICD-10-CM | POA: Diagnosis not present

## 2015-09-27 DIAGNOSIS — N529 Male erectile dysfunction, unspecified: Secondary | ICD-10-CM

## 2015-09-27 DIAGNOSIS — Z0000 Encounter for general adult medical examination without abnormal findings: Secondary | ICD-10-CM

## 2015-09-27 LAB — COMPLETE METABOLIC PANEL WITH GFR
ALT: 49 U/L — ABNORMAL HIGH (ref 9–46)
AST: 27 U/L (ref 10–35)
Albumin: 4.9 g/dL (ref 3.6–5.1)
Alkaline Phosphatase: 56 U/L (ref 40–115)
BUN: 18 mg/dL (ref 7–25)
CHLORIDE: 100 mmol/L (ref 98–110)
CO2: 26 mmol/L (ref 20–31)
Calcium: 9.5 mg/dL (ref 8.6–10.3)
Creat: 0.8 mg/dL (ref 0.70–1.25)
Glucose, Bld: 94 mg/dL (ref 65–99)
Potassium: 4.4 mmol/L (ref 3.5–5.3)
Sodium: 137 mmol/L (ref 135–146)
Total Bilirubin: 0.9 mg/dL (ref 0.2–1.2)
Total Protein: 7.4 g/dL (ref 6.1–8.1)

## 2015-09-27 LAB — LIPID PANEL
Cholesterol: 227 mg/dL — ABNORMAL HIGH (ref 125–200)
HDL: 57 mg/dL (ref >=40)
LDL Cholesterol: 111 mg/dL (ref <130)
TRIGLYCERIDES: 294 mg/dL — AB (ref <150)
Total CHOL/HDL Ratio: 4 Ratio (ref <=5.0)
VLDL: 59 mg/dL — AB (ref <30)

## 2015-09-27 LAB — TESTOSTERONE: Testosterone: 418 ng/dL (ref 250–827)

## 2015-09-27 MED ORDER — ATORVASTATIN CALCIUM 40 MG PO TABS: 20.0000 mg | ORAL_TABLET | Freq: Every day | ORAL | 1 refills | Status: DC

## 2015-09-27 NOTE — Progress Notes (Signed)
By signing my name below, I, Mesha Guinyard, attest that this documentation has been prepared under the direction and in the presence of [name].  Electronically Signed: Verlee Monte, Medical Scribe. 09/27/15. 10:29 AM.  Subjective:    Patient ID: Nathan Mora, male    DOB: 1945-08-17, 69 y.o.   MRN: [id number]  HPI Chief Complaint  Patient presents with  . Annual Exam    CPE , declines flu shot     HPI Comments: Drilon Ussery is a 70 y.o. male with a PMHx of HLD  who presents to the [facility] for his complete annual physical. Previous pt of Dr. Elder Cyphers and his last physical was in 2015. Pt had a cup of coffee this morning. Otherwise fasting.   HLD: Takes Lipitor 20 mg QD. Pt last took it last night. Lab Results  Component Value Date   CHOL 175 10/11/2013   HDL 57 10/11/2013   LDLCALC 106 (H) 10/11/2013   TRIG 61 10/11/2013   CHOLHDL 3.1 10/11/2013   Lab Results  Component Value Date   ALT 34 10/11/2013   AST 24 10/11/2013   ALKPHOS 54 10/11/2013   BILITOT 1.2 10/11/2013   ED: Has been prescribed Cialis 10 mg in the past, but no longer takes it. Pt's wife is concerned about his testosterone level due to the pt having painful intercourse on the shaft of his penis. Pt would have sores and abrasions on his penis after intercourse due to scarring from his wife's previous surgery, but that has been revised and less sore. 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 Pt denies difficulty obtaining an erection.   Finger Joint Pain: Onset this year. Pt takes tylenol arthritis; 2 in the morning, and 2 at night for relief to his symptoms.  Cancer Screening: Prostate CA: Pt deferred a digital prostate exam. Lab Results  Component Value Date   PSA 1.99 10/11/2013   PSA 2.21 03/29/2012  Colon CA: Pt last had one at 70 y/o or 70y/o and had nl results. Pt would ike to go to  Dr. Edwina Barth for gastroenterology in [place]. Skin CA: Pt had some skin cancer taken off of his arms, and face. Pt is followed by Dr. [place]ique.  Immunizations: Pt is due for pnemo vac as previous dose was prior to age 59. Pt has deferred the flu vaccine. Immunization History  Administered Date(s) Administered  . Influenza,inj,Quad PF,36+ Mos 10/11/2013  . Pneumococcal Conjugate-13 10/11/2013  . Pneumococcal Polysaccharide-23 12/12/2006  . Td 01/11/2008  . Zoster 02/11/2008   Vision: Pt takes [place] and latanoprost for glaucoma. Pt is followed by an ophthalmologist.  Visual Acuity Screening   Right eye Left eye Both eyes  Without correction: 20/70 20/70 20/50   With correction:     Comments: Patient has a contact in his left eye   Dentist: Pt is followed by his brother, who is a Pharmacist, community.    Exercise: Pt plays a lot of golf, and walks for excercise. Pt plans on lifting weights for exercise.  Depression: Depression screen [facility] 2/9 09/27/2015  Decreased Interest 0  Down, Depressed, Hopeless 0  PHQ - 2 Score 0   HIV/Hep C Screening: Pt hasn't has his HIV screening, but would like to get screened today. Pt's Hep C antibody was neg March 14th  Fall Screening: No falls in the past year  Advance Directives: Pt has a  living will, and plans on bringing it in to the office.  Patient Active Problem List   Diagnosis Date Noted  . Family history of early CAD 04/23/2012  . Hyperlipidemia   . Glaucoma   . Lipid disorder 03/29/2012   Past Medical History:  Diagnosis Date  . Glaucoma    left eye  . Hyperlipidemia   . Lipid disorder 03/29/2012   No past surgical history on file. No Known Allergies Prior to Admission medications   Medication Sig Start Date End Date Taking? Authorizing Provider  atorvastatin (LIPITOR) 40 MG tablet Take 1 tablet (40 mg total) by mouth daily. PATIENT NEEDS OFFICE VISIT FOR ADDITIONAL REFILLS 10/11/13  Yes Orma Flaming, MD  brimonidine-timolol (COMBIGAN)  0.2-0.5 % ophthalmic solution Place 1 drop into both eyes every 12 (twelve) hours.   Yes Historical Provider, MD  latanoprost (XALATAN) 0.005 % ophthalmic solution 1 drop as directed.   Yes Historical Provider, MD  tadalafil (CIALIS) 10 MG tablet Take 1 tablet (10 mg total) by mouth daily as needed for erectile dysfunction. 10/11/13 06/01/14  Orma Flaming, MD  terbinafine (LAMISIL) 250 MG tablet Take one tablet once daily x 7 days, then repeat every month x 4 months Patient not taking: Reported on 09/27/2015 05/31/15   Wallene Huh, DPM   Social History   Social History  . Marital status: Married    Spouse name: N/A  . Number of children: N/A  . Years of education: N/A   Occupational History  . Not on file.   Social History Main Topics  . Smoking status: Never Smoker  . Smokeless tobacco: Not on file  . Alcohol use Yes  . Drug use: No  . Sexual activity: Yes   Other Topics Concern  . Not on file   Social History Narrative  . No narrative on file   Review of Systems  13 point ROS positive for eye redness, and joint pain. Objective:  Physical Exam  Constitutional: He is oriented to person, place, and time. He appears well-developed and well-nourished.  HENT:  Head: Normocephalic and atraumatic.  Right Ear: External ear normal.  Left Ear: External ear normal.  Mouth/Throat: Oropharynx is clear and moist.  Eyes: Conjunctivae and EOM are normal. Pupils are equal, round, and reactive to light.  Neck: Normal range of motion. Neck supple. No thyromegaly present.  Cardiovascular: Normal rate, regular rhythm, normal heart sounds and intact distal pulses.   Pulmonary/Chest: Effort normal and breath sounds normal. No respiratory distress. He has no wheezes.  Abdominal: Soft. He exhibits no distension. There is no tenderness. Hernia confirmed negative in the right inguinal area and confirmed negative in the left inguinal area.  Genitourinary:  Genitourinary Comments: dre deferred.     Musculoskeletal: Normal range of motion. He exhibits no edema or tenderness.  Prominent DIPs primary on the 1st and 5th fingers bilaterally, FROM, Full strength  Lymphadenopathy:    He has no cervical adenopathy.  Neurological: He is alert and oriented to person, place, and time. He has normal reflexes.  Skin: Skin is warm and dry.  Psychiatric: He has a normal mood and affect. His behavior is normal.  Vitals reviewed.  BP 116/74   Pulse (!) 57   Temp 98.2 F (36.8 C) (Oral)   Resp 18   Ht 5\' 7"  (1.702 m)   Wt 190 lb 9.6 oz (86.5 kg)   SpO2 97%   BMI 29.85 kg/m   Assessment & Plan:   Marcello Moores  Selfe is a 70 y.o. male Special screening for malignant neoplasms, colon - Plan: Ambulatory referral to Gastroenterology  - colon cancer screening. Referral placed to GI.  Annual physical exam -  - -anticipatory guidance as below in AVS, screening labs above. Health maintenance items as above in HPI discussed/recommended as applicable.   Hyperlipidemia - Plan: COMPLETE METABOLIC PANEL WITH GFR, Lipid panel, atorvastatin (LIPITOR) 40 MG tablet  - Continue Lipitor same dose as he is tolerating current dose of 20 mg, and labs pending  Glaucoma -   - Continue eyedrops and routine follow-up with ophthalmology  Erectile dysfunction, unspecified erectile dysfunction type - Plan: Testosterone  - Doubt testosterone issue, but agreed to check level with plan on repeat testing between 18 10 in the morning if this initial test is low.  -I suspect some psychogenic component with previous irritation from scar tissue. Discussed trying a condom or anesthetic gel/cream to lessen discomfort. If symptoms persist, return to discuss other treatments  Screening for HIV (human immunodeficiency virus) - Plan: HIV antibody  Needs flu shot - Plan: Flu Vaccine QUAD 36+ mos IM given.   Need for prophylactic vaccination against Streptococcus pneumoniae (pneumococcus) - Plan: Pneumococcal polysaccharide vaccine  23-valent greater than or equal to 2yo subcutaneous/IM   -pneumovax given  Meds ordered this encounter  Medications  . atorvastatin (LIPITOR) 40 MG tablet    Sig: Take 0.5 tablets (20 mg total) by mouth daily at 6 PM.    Dispense:  90 tablet    Refill:  1   Patient Instructions       IF you received an x-ray today, you will receive an invoice from [facility]. Please contact [facility] at [phone] with questions or concerns regarding your invoice.   IF you received labwork today, you will receive an invoice from [facility]. Please contact [facility] at [phone] with questions or concerns regarding your invoice.   Our billing staff will not be able to assist you with questions regarding bills from these companies.  You will be contacted with the lab results as soon as they are available. The fastest way to get your results is to activate your My Chart account. Instructions are located on the last page of this paperwork. If you have not heard from [place] regarding the results in 2 weeks, please contact this office.     Continue Tylenol as needed for finger pains. If this gets worse, or new areas of swelling, return to discuss this further as well as possible blood tests for other types of arthritis.  I will check a testosterone level, but if that is low, would need to be repeated at least once in the morning. Try using a condom with intercourse to see if this lessens discomfort, but let me know if this does not help. Let me know if you would like to try Viagra, Levitra, or Cialis.  No change in dose of cholesterol medicine for now.  We will refer you for colonoscopy.  Flu vaccine and last pneumonia vaccine were given today.  Keeping you healthy  Get these tests  Blood pressure- Have your blood pressure checked once a year by your healthcare provider.  Normal blood pressure is 120/80  Weight- Have your body mass index (BMI)  calculated to screen for obesity.  BMI is a measure of body fat based on height and weight. You can also calculate your own BMI at [email or web address].  Cholesterol- Have your cholesterol checked every year.  Diabetes- Have  your blood sugar checked regularly if you have high blood pressure, high cholesterol, have a family history of diabetes or if you are overweight.  Screening for Colon Cancer- Colonoscopy starting at age 38.  Screening may begin sooner depending on your family history and other health conditions. Follow up colonoscopy as directed by your Gastroenterologist.  Screening for Prostate Cancer- Both blood work (PSA) and a rectal exam help screen for Prostate Cancer.  Screening begins at age 2 with African-American men and at age 48 with Caucasian men.  Screening may begin sooner depending on your family history.  Take these medicines  Aspirin- One aspirin daily can help prevent Heart disease and Stroke.  Flu shot- Every fall.  Tetanus- Every 10 years.  Zostavax- Once after the age of 74 to prevent Shingles.  Pneumonia shot- Once after the age of 24; if you are younger than 49, ask your healthcare provider if you need a Pneumonia shot.  Take these steps  Don't smoke- If you do smoke, talk to your doctor about quitting.  For tips on how to quit, go to www.smokefree.gov or call 1-800-QUIT-NOW.  Be physically active- Exercise 5 days a week for at least 30 minutes.  If you are not already physically active start slow and gradually work up to 30 minutes of moderate physical activity.  Examples of moderate activity include walking briskly, mowing the yard, dancing, swimming, bicycling, etc.  Eat a healthy diet- Eat a variety of healthy food such as fruits, vegetables, low fat milk, low fat cheese, yogurt, lean meant, poultry, fish, beans, tofu, etc. For more information go to www.thenutritionsource.org  Drink alcohol in moderation- Limit alcohol intake to less than two  drinks a day. Never drink and drive.  Dentist- Brush and floss twice daily; visit your dentist twice a year.  Depression- Your emotional health is as important as your physical health. If you're feeling down, or losing interest in things you would normally enjoy please talk to your healthcare provider.  Eye exam- Visit your eye doctor every year.  Safe sex- If you may be exposed to a sexually transmitted infection, use a condom.  Seat belts- Seat belts can save your life; always wear one.  Smoke/Carbon Monoxide detectors- These detectors need to be installed on the appropriate level of your home.  Replace batteries at least once a year.  Skin cancer- When out in the sun, cover up and use sunscreen 15 SPF or higher.  Violence- If anyone is threatening you, please tell your healthcare provider.  Living Will/ Health care power of attorney- Speak with your healthcare provider and family.   I personally performed the services described in this documentation, which was scribed in my presence. The recorded information has been reviewed and considered, and addended by me as needed.   Signed,   [name], MD [facility].  09/28/15 9:52 AM

## 2015-09-27 NOTE — Patient Instructions (Addendum)
IF you received an x-ray today, you will receive an invoice from [facility]. Please contact [facility] at [phone] with questions or concerns regarding your invoice.   IF you received labwork today, you will receive an invoice from [employer]. Please contact [facility] at [phone] with questions or concerns regarding your invoice.   Our billing staff will not be able to assist you with questions regarding bills from these companies.  You will be contacted with the lab results as soon as they are available. The fastest way to get your results is to activate your My Chart account. Instructions are located on the last page of this paperwork. If you have not heard from [place] regarding the results in 2 weeks, please contact this office.     Continue Tylenol as needed for finger pains. If this gets worse, or new areas of swelling, return to discuss this further as well as possible blood tests for other types of arthritis.  I will check a testosterone level, but if that is low, would need to be repeated at least once in the morning. Try using a condom with intercourse to see if this lessens discomfort, but let me know if this does not help. Let me know if you would like to try Viagra, Levitra, or Cialis.  No change in dose of cholesterol medicine for now.  We will refer you for colonoscopy.  Flu vaccine and last pneumonia vaccine were given today.  Keeping you healthy  Get these tests  Blood pressure- Have your blood pressure checked once a year by your healthcare provider.  Normal blood pressure is 120/80  Weight- Have your body mass index (BMI) calculated to screen for obesity.  BMI is a measure of body fat based on height and weight. You can also calculate your own BMI at [employer]Banking[email or web address].  Cholesterol- Have your cholesterol checked every year.  Diabetes- Have your blood sugar checked regularly if you have high blood  pressure, high cholesterol, have a family history of diabetes or if you are overweight.  Screening for Colon Cancer- Colonoscopy starting at age 26.  Screening may begin sooner depending on your family history and other health conditions. Follow up colonoscopy as directed by your Gastroenterologist.  Screening for Prostate Cancer- Both blood work (PSA) and a rectal exam help screen for Prostate Cancer.  Screening begins at age 31 with African-American men and at age 42 with Caucasian men.  Screening may begin sooner depending on your family history.  Take these medicines  Aspirin- One aspirin daily can help prevent Heart disease and Stroke.  Flu shot- Every fall.  Tetanus- Every 10 years.  Zostavax- Once after the age of 44 to prevent Shingles.  Pneumonia shot- Once after the age of 46; if you are younger than 49, ask your healthcare provider if you need a Pneumonia shot.  Take these steps  Don't smoke- If you do smoke, talk to your doctor about quitting.  For tips on how to quit, go to www.smokefree.gov or call 1-800-QUIT-NOW.  Be physically active- Exercise 5 days a week for at least 30 minutes.  If you are not already physically active start slow and gradually work up to 30 minutes of moderate physical activity.  Examples of moderate activity include walking briskly, mowing the yard, dancing, swimming, bicycling, etc.  Eat a healthy diet- Eat a variety of healthy food such as fruits, vegetables, low fat milk, low fat cheese, yogurt, lean meant, poultry, fish, beans, tofu,  etc. For more information go to www.thenutritionsource.org  Drink alcohol in moderation- Limit alcohol intake to less than two drinks a day. Never drink and drive.  Dentist- Brush and floss twice daily; visit your dentist twice a year.  Depression- Your emotional health is as important as your physical health. If you're feeling down, or losing interest in things you would normally enjoy please talk to your  healthcare provider.  Eye exam- Visit your eye doctor every year.  Safe sex- If you may be exposed to a sexually transmitted infection, use a condom.  Seat belts- Seat belts can save your life; always wear one.  Smoke/Carbon Monoxide detectors- These detectors need to be installed on the appropriate level of your home.  Replace batteries at least once a year.  Skin cancer- When out in the sun, cover up and use sunscreen 15 SPF or higher.  Violence- If anyone is threatening you, please tell your healthcare provider.  Living Will/ Health care power of attorney- Speak with your healthcare provider and family.

## 2015-09-28 ENCOUNTER — Encounter: Payer: Self-pay | Admitting: Family Medicine

## 2015-09-28 LAB — HIV ANTIBODY (ROUTINE TESTING W REFLEX): HIV: NONREACTIVE

## 2015-12-05 DIAGNOSIS — Z85828 Personal history of other malignant neoplasm of skin: Secondary | ICD-10-CM | POA: Diagnosis not present

## 2015-12-05 DIAGNOSIS — D1801 Hemangioma of skin and subcutaneous tissue: Secondary | ICD-10-CM | POA: Diagnosis not present

## 2015-12-05 DIAGNOSIS — D225 Melanocytic nevi of trunk: Secondary | ICD-10-CM | POA: Diagnosis not present

## 2015-12-05 DIAGNOSIS — L821 Other seborrheic keratosis: Secondary | ICD-10-CM | POA: Diagnosis not present

## 2016-01-01 DIAGNOSIS — K5730 Diverticulosis of large intestine without perforation or abscess without bleeding: Secondary | ICD-10-CM | POA: Diagnosis not present

## 2016-01-01 DIAGNOSIS — Z1211 Encounter for screening for malignant neoplasm of colon: Secondary | ICD-10-CM | POA: Diagnosis not present

## 2016-01-02 ENCOUNTER — Encounter: Payer: Self-pay | Admitting: Family Medicine

## 2016-03-31 DIAGNOSIS — H401123 Primary open-angle glaucoma, left eye, severe stage: Secondary | ICD-10-CM | POA: Diagnosis not present

## 2016-03-31 DIAGNOSIS — H2513 Age-related nuclear cataract, bilateral: Secondary | ICD-10-CM | POA: Diagnosis not present

## 2016-03-31 DIAGNOSIS — H401111 Primary open-angle glaucoma, right eye, mild stage: Secondary | ICD-10-CM | POA: Diagnosis not present

## 2016-04-01 ENCOUNTER — Encounter: Payer: Self-pay | Admitting: Family Medicine

## 2016-04-01 DIAGNOSIS — N529 Male erectile dysfunction, unspecified: Secondary | ICD-10-CM

## 2016-04-01 DIAGNOSIS — Z7189 Other specified counseling: Secondary | ICD-10-CM

## 2016-04-01 DIAGNOSIS — Z0000 Encounter for general adult medical examination without abnormal findings: Secondary | ICD-10-CM

## 2016-04-01 DIAGNOSIS — H409 Unspecified glaucoma: Secondary | ICD-10-CM

## 2016-04-02 MED ORDER — TADALAFIL 10 MG PO TABS: 10.0000 mg | ORAL_TABLET | Freq: Every day | ORAL | 0 refills | Status: DC | PRN

## 2016-04-02 NOTE — Telephone Encounter (Signed)
Patient notified via My Chart. Needs follow-up visit 05/2016 with Dr. [name].  Meds ordered this encounter  Medications  . tadalafil (CIALIS) 10 MG tablet    Sig: Take 1 tablet (10 mg total) by mouth daily as needed for erectile dysfunction.    Dispense:  20 tablet    Refill:  0    Order Specific Question:   Supervising Provider    Answer:   Brigitte Pulse, EVA N [[id number]]

## 2016-04-03 ENCOUNTER — Telehealth: Payer: Self-pay

## 2016-04-03 NOTE — Telephone Encounter (Signed)
CIALIS NOT COVERED  NEED PA  [id number]  [id number] ID#

## 2016-04-04 ENCOUNTER — Encounter: Payer: Self-pay | Admitting: Family Medicine

## 2016-04-04 MED ORDER — SILDENAFIL CITRATE 20 MG PO TABS: ORAL_TABLET | ORAL | 0 refills | Status: DC

## 2016-04-04 NOTE — Telephone Encounter (Signed)
Sildenafil Rx provided. Follow up as planned, let me know if there are questions.

## 2016-04-04 NOTE — Telephone Encounter (Signed)
This medication is not approved for ED.  It is available at [facility] for $2 per pill.

## 2016-04-04 NOTE — Telephone Encounter (Signed)
I called patient and made him aware of the denial and the cash price at [facility].  He states that he has a coupon for generic viagra for 90 cents.  He would like this sent into [facility] today.

## 2016-04-07 ENCOUNTER — Other Ambulatory Visit: Payer: Self-pay | Admitting: Family Medicine

## 2016-05-25 ENCOUNTER — Encounter: Payer: Self-pay | Admitting: Family Medicine

## 2016-06-05 DIAGNOSIS — M170 Bilateral primary osteoarthritis of knee: Secondary | ICD-10-CM | POA: Diagnosis not present

## 2016-06-05 DIAGNOSIS — M1712 Unilateral primary osteoarthritis, left knee: Secondary | ICD-10-CM | POA: Diagnosis not present

## 2016-06-05 DIAGNOSIS — Z789 Other specified health status: Secondary | ICD-10-CM | POA: Diagnosis not present

## 2016-06-05 DIAGNOSIS — M25562 Pain in left knee: Secondary | ICD-10-CM | POA: Diagnosis not present

## 2016-06-26 ENCOUNTER — Ambulatory Visit: Payer: Self-pay | Admitting: Family Medicine

## 2016-06-30 DIAGNOSIS — H524 Presbyopia: Secondary | ICD-10-CM | POA: Diagnosis not present

## 2016-06-30 DIAGNOSIS — H5213 Myopia, bilateral: Secondary | ICD-10-CM | POA: Diagnosis not present

## 2016-06-30 DIAGNOSIS — H52223 Regular astigmatism, bilateral: Secondary | ICD-10-CM | POA: Diagnosis not present

## 2016-07-29 DIAGNOSIS — S0502XA Injury of conjunctiva and corneal abrasion without foreign body, left eye, initial encounter: Secondary | ICD-10-CM | POA: Diagnosis not present

## 2016-07-29 DIAGNOSIS — H16142 Punctate keratitis, left eye: Secondary | ICD-10-CM | POA: Diagnosis not present

## 2016-07-31 DIAGNOSIS — H16142 Punctate keratitis, left eye: Secondary | ICD-10-CM | POA: Diagnosis not present

## 2016-07-31 DIAGNOSIS — S0502XS Injury of conjunctiva and corneal abrasion without foreign body, left eye, sequela: Secondary | ICD-10-CM | POA: Diagnosis not present

## 2016-07-31 DIAGNOSIS — H401123 Primary open-angle glaucoma, left eye, severe stage: Secondary | ICD-10-CM | POA: Diagnosis not present

## 2016-09-11 ENCOUNTER — Telehealth: Payer: Self-pay

## 2016-09-11 NOTE — Telephone Encounter (Signed)
Called pt to schedule Medicare Annual Wellness Visit with nurse health advisor. -nr  

## 2016-09-26 ENCOUNTER — Other Ambulatory Visit: Payer: Self-pay | Admitting: Family Medicine

## 2016-09-26 DIAGNOSIS — H409 Unspecified glaucoma: Secondary | ICD-10-CM

## 2016-09-26 DIAGNOSIS — E785 Hyperlipidemia, unspecified: Secondary | ICD-10-CM

## 2016-09-26 DIAGNOSIS — Z0000 Encounter for general adult medical examination without abnormal findings: Secondary | ICD-10-CM

## 2016-09-26 DIAGNOSIS — Z7189 Other specified counseling: Secondary | ICD-10-CM

## 2016-11-13 ENCOUNTER — Ambulatory Visit (INDEPENDENT_AMBULATORY_CARE_PROVIDER_SITE_OTHER): Payer: Medicare HMO

## 2016-11-13 VITALS — BP 122/80 | HR 67 | Ht 68.0 in | Wt 198.4 lb

## 2016-11-13 DIAGNOSIS — E785 Hyperlipidemia, unspecified: Secondary | ICD-10-CM | POA: Diagnosis not present

## 2016-11-13 DIAGNOSIS — Z0000 Encounter for general adult medical examination without abnormal findings: Secondary | ICD-10-CM

## 2016-11-13 NOTE — Patient Instructions (Addendum)
[name] , Thank you for taking time to come for your Medicare Wellness Visit. I appreciate your ongoing commitment to your health goals. Please review the following plan we discussed and let me know if I can assist you in the future.   Screening recommendations/referrals: Colonoscopy: up to date, next due 12/31/2025 Recommended yearly ophthalmology/optometry visit for glaucoma screening and checkup Recommended yearly dental visit for hygiene and checkup  Vaccinations: Influenza vaccine: declined, You will have this done at your pharmacy  Pneumococcal vaccine: up to date Tdap vaccine: up to date, next due 01/10/2018 Shingles vaccine: up to date    Advanced directives: Please bring a copy of your POA (Power of [place]) and/or Living Will to your next appointment.   Conditions/risks identified: Try to lose 30 lbs in the near future.   Next appointment: schedule follow up visit with PCP, 1 year for AWV  Preventive Care 15 Years and Older, Male Preventive care refers to lifestyle choices and visits with your health care provider that can promote health and wellness. What does preventive care include?  A yearly physical exam. This is also called an annual well check.  Dental exams once or twice a year.  Routine eye exams. Ask your health care provider how often you should have your eyes checked.  Personal lifestyle choices, including:  Daily care of your teeth and gums.  Regular physical activity.  Eating a healthy diet.  Avoiding tobacco and drug use.  Limiting alcohol use.  Practicing safe sex.  Taking low doses of aspirin every day.  Taking vitamin and mineral supplements as recommended by your health care provider. What happens during an annual well check? The services and screenings done by your health care provider during your annual well check will depend on your age, overall health, lifestyle risk factors, and family history of disease. Counseling  Your health  care provider may ask you questions about your:  Alcohol use.  Tobacco use.  Drug use.  Emotional well-being.  Home and relationship well-being.  Sexual activity.  Eating habits.  History of falls.  Memory and ability to understand (cognition).  Work and work Statistician. Screening  You may have the following tests or measurements:  Height, weight, and BMI.  Blood pressure.  Lipid and cholesterol levels. These may be checked every 5 years, or more frequently if you are over 26 years old.  Skin check.  Lung cancer screening. You may have this screening every year starting at age 36 if you have a 30-pack-year history of smoking and currently smoke or have quit within the past 15 years.  Fecal occult blood test (FOBT) of the stool. You may have this test every year starting at age 56.  Flexible sigmoidoscopy or colonoscopy. You may have a sigmoidoscopy every 5 years or a colonoscopy every 10 years starting at age 21.  Prostate cancer screening. Recommendations will vary depending on your family history and other risks.  Hepatitis C blood test.  Hepatitis B blood test.  Sexually transmitted disease (STD) testing.  Diabetes screening. This is done by checking your blood sugar (glucose) after you have not eaten for a while (fasting). You may have this done every 1-3 years.  Abdominal aortic aneurysm (AAA) screening. You may need this if you are a current or former smoker.  Osteoporosis. You may be screened starting at age 5 if you are at high risk. Talk with your health care provider about your test results, treatment options, and if necessary, the need for  more tests. Vaccines  Your health care provider may recommend certain vaccines, such as:  Influenza vaccine. This is recommended every year.  Tetanus, diphtheria, and acellular pertussis (Tdap, Td) vaccine. You may need a Td booster every 10 years.  Zoster vaccine. You may need this after age  77.  Pneumococcal 13-valent conjugate (PCV13) vaccine. One dose is recommended after age 60.  Pneumococcal polysaccharide (PPSV23) vaccine. One dose is recommended after age 45. Talk to your health care provider about which screenings and vaccines you need and how often you need them. This information is not intended to replace advice given to you by your health care provider. Make sure you discuss any questions you have with your health care provider. Document Released: 02/09/2015 Document Revised: 10/03/2015 Document Reviewed: 11/14/2014 Elsevier Interactive Patient Education  2017 [employer] Prevention in the Home Falls can cause injuries. They can happen to people of all ages. There are many things you can do to make your home safe and to help prevent falls. What can I do on the outside of my home?  Regularly fix the edges of walkways and driveways and fix any cracks.  Remove anything that might make you trip as you walk through a door, such as a raised step or threshold.  Trim any bushes or trees on the path to your home.  Use bright outdoor lighting.  Clear any walking paths of anything that might make someone trip, such as rocks or tools.  Regularly check to see if handrails are loose or broken. Make sure that both sides of any steps have handrails.  Any raised decks and porches should have guardrails on the edges.  Have any leaves, snow, or ice cleared regularly.  Use sand or salt on walking paths during winter.  Clean up any spills in your garage right away. This includes oil or grease spills. What can I do in the bathroom?  Use night lights.  Install grab bars by the toilet and in the tub and shower. Do not use towel bars as grab bars.  Use non-skid mats or decals in the tub or shower.  If you need to sit down in the shower, use a plastic, non-slip stool.  Keep the floor dry. Clean up any water that spills on the floor as soon as it happens.  Remove  soap buildup in the tub or shower regularly.  Attach bath mats securely with double-sided non-slip rug tape.  Do not have throw rugs and other things on the floor that can make you trip. What can I do in the bedroom?  Use night lights.  Make sure that you have a light by your bed that is easy to reach.  Do not use any sheets or blankets that are too big for your bed. They should not hang down onto the floor.  Have a firm chair that has side arms. You can use this for support while you get dressed.  Do not have throw rugs and other things on the floor that can make you trip. What can I do in the kitchen?  Clean up any spills right away.  Avoid walking on wet floors.  Keep items that you use a lot in easy-to-reach places.  If you need to reach something above you, use a strong step stool that has a grab bar.  Keep electrical cords out of the way.  Do not use floor polish or wax that makes floors slippery. If you must use wax, use non-skid  floor wax.  Do not have throw rugs and other things on the floor that can make you trip. What can I do with my stairs?  Do not leave any items on the stairs.  Make sure that there are handrails on both sides of the stairs and use them. Fix handrails that are broken or loose. Make sure that handrails are as long as the stairways.  Check any carpeting to make sure that it is firmly attached to the stairs. Fix any carpet that is loose or worn.  Avoid having throw rugs at the top or bottom of the stairs. If you do have throw rugs, attach them to the floor with carpet tape.  Make sure that you have a light switch at the top of the stairs and the bottom of the stairs. If you do not have them, ask someone to add them for you. What else can I do to help prevent falls?  Wear shoes that:  Do not have high heels.  Have rubber bottoms.  Are comfortable and fit you well.  Are closed at the toe. Do not wear sandals.  If you use a  stepladder:  Make sure that it is fully opened. Do not climb a closed stepladder.  Make sure that both sides of the stepladder are locked into place.  Ask someone to hold it for you, if possible.  Clearly mark and make sure that you can see:  Any grab bars or handrails.  First and last steps.  Where the edge of each step is.  Use tools that help you move around (mobility aids) if they are needed. These include:  Canes.  Walkers.  Scooters.  Crutches.  Turn on the lights when you go into a dark area. Replace any light bulbs as soon as they burn out.  Set up your furniture so you have a clear path. Avoid moving your furniture around.  If any of your floors are uneven, fix them.  If there are any pets around you, be aware of where they are.  Review your medicines with your doctor. Some medicines can make you feel dizzy. This can increase your chance of falling. Ask your doctor what other things that you can do to help prevent falls. This information is not intended to replace advice given to you by your health care provider. Make sure you discuss any questions you have with your health care provider. Document Released: 11/09/2008 Document Revised: 06/21/2015 Document Reviewed: 02/17/2014 Elsevier Interactive Patient Education  2017 [employer].

## 2016-11-13 NOTE — Progress Notes (Signed)
Subjective:   Nathan Mora is a 71 y.o. male who presents for Medicare Annual/Subsequent preventive examination.  Review of Systems:  N/A Cardiac Risk Factors include: advanced age (>25men, >83 women);dyslipidemia;male gender;obesity (BMI >30kg/m2)     Objective:    Vitals: BP 122/80   Pulse 67   Ht 5\' 8"  (1.727 m)   Wt 198 lb 6 oz (90 kg)   SpO2 97%   BMI 30.16 kg/m   Body mass index is 30.16 kg/m.  Tobacco History  Smoking Status  . Never Smoker  Smokeless Tobacco  . Never Used     Counseling given: Not Answered   Past Medical History:  Diagnosis Date  . Glaucoma    left eye  . Hyperlipidemia   . Lipid disorder 03/29/2012   History reviewed. No pertinent surgical history. Family History  Problem Relation Age of Onset  . Cancer Father        lung  . Heart attack Brother   . Hypertension Brother   . Hypertension Brother    History  Sexual Activity  . Sexual activity: Yes    Outpatient Encounter Prescriptions as of 11/13/2016  Medication Sig  . atorvastatin (LIPITOR) 40 MG tablet Take 0.5 tablets (20 mg total) by mouth daily at 6 PM.  . brimonidine-timolol (COMBIGAN) 0.2-0.5 % ophthalmic solution Place 1 drop into both eyes every 12 (twelve) hours.  Marland Kitchen latanoprost (XALATAN) 0.005 % ophthalmic solution 1 drop as directed.  . sildenafil (REVATIO) 20 MG tablet 1 to 3 tabs up to QD prior to onset of sexual activity.   No facility-administered encounter medications on file as of 11/13/2016.     Activities of Daily Living In your present state of health, do you have any difficulty performing the following activities: 11/13/2016  Hearing? N  Vision? Y  Comment Patient has glaucoma in left eye, worse at night   Difficulty concentrating or making decisions? N  Walking or climbing stairs? N  Dressing or bathing? N  Doing errands, shopping? N  Preparing Food and eating ? N  Using the Toilet? N  In the past six months, have you accidently leaked urine? N    Do you have problems with loss of bowel control? N  Managing your Medications? N  Managing your Finances? N  Housekeeping or managing your Housekeeping? N  Some recent data might be hidden    Patient Care Team: [name], MD as PCP - General (Family Medicine)   Assessment:     Exercise Activities and Dietary recommendations Current Exercise Habits: The patient does not participate in regular exercise at present (Plays golf at times, walks on his job ), Exercise limited by: None identified  Goals    . Weight (lb) < 168 lb (76.2 kg)          Patient states he would like to try to lose 30 lbs in the near future.       Fall Risk Fall Risk  11/13/2016 09/27/2015  Falls in the past year? No No   Depression Screen PHQ 2/9 Scores 11/13/2016 09/27/2015  PHQ - 2 Score 0 0    Cognitive Function     6CIT Screen 11/13/2016  What Year? 0 points  What month? 0 points  What time? 0 points  Count back from 20 0 points  Months in reverse 0 points  Repeat phrase 0 points  Total Score 0    Immunization History  Administered Date(s) Administered  . Influenza,inj,Quad PF,6+ Mos 10/11/2013,  09/27/2015  . Pneumococcal Conjugate-13 10/11/2013  . Pneumococcal Polysaccharide-23 12/12/2006, 09/27/2015  . Td 01/11/2008  . Zoster 02/11/2008   Screening Tests Health Maintenance  Topic Date Due  . INFLUENZA VACCINE  01/13/2017 (Originally 08/27/2016)  . TETANUS/TDAP  01/10/2018  . COLONOSCOPY  12/31/2025  . Hepatitis C Screening  Completed  . PNA vac Low Risk Adult  Completed      Plan:   I have personally reviewed and noted the following in the patient's chart:   . Medical and social history . Use of alcohol, tobacco or illicit drugs  . Current medications and supplements . Functional ability and status . Nutritional status . Physical activity . Advanced directives . List of other physicians . Hospitalizations, surgeries, and ER visits in previous 12  months . Vitals . Screenings to include cognitive, depression, and falls . Referrals and appointments  In addition, I have reviewed and discussed with patient certain preventive protocols, quality metrics, and best practice recommendations. A written personalized care plan for preventive services as well as general preventive health recommendations were provided to patient.  Blood work ordered. Patient declined flu vaccine here. He will receive this at his pharmacy.    Andrez Grime, LPN  [id number]

## 2016-11-14 LAB — CBC WITH DIFFERENTIAL/PLATELET
BASOS ABS: 0 10*3/uL (ref 0.0–0.2)
Basos: 0 %
EOS (ABSOLUTE): 0 10*3/uL (ref 0.0–0.4)
Eos: 0 %
HEMATOCRIT: 44.1 % (ref 37.5–51.0)
HEMOGLOBIN: 14.7 g/dL (ref 13.0–17.7)
IMMATURE GRANULOCYTES: 1 %
Immature Grans (Abs): 0.1 10*3/uL (ref 0.0–0.1)
LYMPHS: 49 %
Lymphocytes Absolute: 3.8 10*3/uL — ABNORMAL HIGH (ref 0.7–3.1)
MCH: 31 pg (ref 26.6–33.0)
MCHC: 33.3 g/dL (ref 31.5–35.7)
MCV: 93 fL (ref 79–97)
MONOCYTES: 10 %
Monocytes Absolute: 0.8 10*3/uL (ref 0.1–0.9)
NEUTROS PCT: 40 %
Neutrophils Absolute: 3.2 10*3/uL (ref 1.4–7.0)
Platelets: 158 10*3/uL (ref 150–379)
RBC: 4.74 x10E6/uL (ref 4.14–5.80)
RDW: 13.4 % (ref 12.3–15.4)
WBC: 7.9 10*3/uL (ref 3.4–10.8)

## 2016-11-14 LAB — COMPREHENSIVE METABOLIC PANEL
ALK PHOS: 60 IU/L (ref 39–117)
ALT: 49 IU/L — ABNORMAL HIGH (ref 0–44)
AST: 22 IU/L (ref 0–40)
Albumin/Globulin Ratio: 2.2 (ref 1.2–2.2)
Albumin: 4.7 g/dL (ref 3.5–4.8)
BILIRUBIN TOTAL: 0.5 mg/dL (ref 0.0–1.2)
BUN / CREAT RATIO: 19 (ref 10–24)
BUN: 18 mg/dL (ref 8–27)
CHLORIDE: 101 mmol/L (ref 96–106)
CO2: 25 mmol/L (ref 20–29)
Calcium: 9.4 mg/dL (ref 8.6–10.2)
Creatinine, Ser: 0.95 mg/dL (ref 0.76–1.27)
GFR calc Af Amer: 93 mL/min/{1.73_m2} (ref >59)
GFR calc non Af Amer: 81 mL/min/{1.73_m2} (ref >59)
GLOBULIN, TOTAL: 2.1 g/dL (ref 1.5–4.5)
GLUCOSE: 95 mg/dL (ref 65–99)
Potassium: 4.6 mmol/L (ref 3.5–5.2)
Sodium: 138 mmol/L (ref 134–144)
Total Protein: 6.8 g/dL (ref 6.0–8.5)

## 2016-11-14 LAB — LIPID PANEL
Chol/HDL Ratio: 4.1 ratio (ref 0.0–5.0)
Cholesterol, Total: 195 mg/dL (ref 100–199)
HDL: 48 mg/dL (ref >39)
LDL CALC: 107 mg/dL — AB (ref 0–99)
Triglycerides: 199 mg/dL — ABNORMAL HIGH (ref 0–149)
VLDL CHOLESTEROL CAL: 40 mg/dL (ref 5–40)

## 2016-11-19 DIAGNOSIS — R69 Illness, unspecified: Secondary | ICD-10-CM | POA: Diagnosis not present

## 2016-11-24 DIAGNOSIS — R69 Illness, unspecified: Secondary | ICD-10-CM | POA: Diagnosis not present

## 2016-12-01 ENCOUNTER — Other Ambulatory Visit: Payer: Self-pay | Admitting: Family Medicine

## 2016-12-01 DIAGNOSIS — Z7189 Other specified counseling: Secondary | ICD-10-CM

## 2016-12-01 DIAGNOSIS — E785 Hyperlipidemia, unspecified: Secondary | ICD-10-CM

## 2016-12-01 DIAGNOSIS — Z0000 Encounter for general adult medical examination without abnormal findings: Secondary | ICD-10-CM

## 2016-12-01 DIAGNOSIS — H409 Unspecified glaucoma: Secondary | ICD-10-CM

## 2016-12-02 ENCOUNTER — Other Ambulatory Visit: Payer: Self-pay

## 2016-12-02 ENCOUNTER — Telehealth: Payer: Self-pay | Admitting: Family Medicine

## 2016-12-02 DIAGNOSIS — Z0000 Encounter for general adult medical examination without abnormal findings: Secondary | ICD-10-CM

## 2016-12-02 DIAGNOSIS — Z7189 Other specified counseling: Secondary | ICD-10-CM

## 2016-12-02 DIAGNOSIS — E785 Hyperlipidemia, unspecified: Secondary | ICD-10-CM

## 2016-12-02 DIAGNOSIS — H409 Unspecified glaucoma: Secondary | ICD-10-CM

## 2016-12-02 MED ORDER — ATORVASTATIN CALCIUM 40 MG PO TABS: 20.0000 mg | ORAL_TABLET | Freq: Every day | ORAL | 1 refills | Status: DC

## 2016-12-02 NOTE — Telephone Encounter (Signed)
Copied from [place] [id number]. Topic: Quick Communication - See Telephone Encounter >> Dec 02, 2016  1:10 PM Corie Chiquito, [place] wrote: CRM for notification. See Telephone encounter for: Patient needs a refill on his Lipitor   12/02/16.

## 2017-01-18 ENCOUNTER — Encounter: Payer: Self-pay | Admitting: Family Medicine

## 2017-01-19 DIAGNOSIS — R69 Illness, unspecified: Secondary | ICD-10-CM | POA: Diagnosis not present

## 2017-02-18 DIAGNOSIS — L570 Actinic keratosis: Secondary | ICD-10-CM | POA: Diagnosis not present

## 2017-02-18 DIAGNOSIS — D225 Melanocytic nevi of trunk: Secondary | ICD-10-CM | POA: Diagnosis not present

## 2017-02-18 DIAGNOSIS — D1801 Hemangioma of skin and subcutaneous tissue: Secondary | ICD-10-CM | POA: Diagnosis not present

## 2017-02-18 DIAGNOSIS — L853 Xerosis cutis: Secondary | ICD-10-CM | POA: Diagnosis not present

## 2017-02-18 DIAGNOSIS — L821 Other seborrheic keratosis: Secondary | ICD-10-CM | POA: Diagnosis not present

## 2017-02-18 DIAGNOSIS — Z85828 Personal history of other malignant neoplasm of skin: Secondary | ICD-10-CM | POA: Diagnosis not present

## 2017-02-18 DIAGNOSIS — L300 Nummular dermatitis: Secondary | ICD-10-CM | POA: Diagnosis not present

## 2017-02-18 DIAGNOSIS — L918 Other hypertrophic disorders of the skin: Secondary | ICD-10-CM | POA: Diagnosis not present

## 2017-04-28 ENCOUNTER — Encounter: Payer: Self-pay | Admitting: Family Medicine

## 2017-04-28 ENCOUNTER — Ambulatory Visit (INDEPENDENT_AMBULATORY_CARE_PROVIDER_SITE_OTHER): Payer: Medicare HMO | Admitting: Family Medicine

## 2017-04-28 VITALS — BP 142/88 | HR 62 | Temp 98.4°F | Resp 17 | Ht 68.0 in | Wt 188.0 lb

## 2017-04-28 DIAGNOSIS — R21 Rash and other nonspecific skin eruption: Secondary | ICD-10-CM | POA: Diagnosis not present

## 2017-04-28 DIAGNOSIS — R0789 Other chest pain: Secondary | ICD-10-CM | POA: Diagnosis not present

## 2017-04-28 MED ORDER — TRAMADOL HCL 50 MG PO TABS: 50.0000 mg | ORAL_TABLET | Freq: Three times a day (TID) | ORAL | 0 refills | Status: DC | PRN

## 2017-04-28 NOTE — Patient Instructions (Addendum)
Your rash and pain appear to be due to shingles. Unfortunately Valtrex may not provide any benefit at this time. Keep area clean with soap and water, cover if needed. Tramadol if needed for pain. Would delay repeat shingles vaccine for at least 6 months as you likely will have some protection form recent flair. If rash spreads, or worsening as we discussed, please return for recheck. Follow up with me in next 1 month for cholesterol (fasting labs that day). Thanks for coming in today.   Return to the clinic or go to the nearest emergency room if any of your symptoms worsen or new symptoms occur.   Shingles Shingles, which is also known as herpes zoster, is an infection that causes a painful skin rash and fluid-filled blisters. Shingles is not related to genital herpes, which is a sexually transmitted infection. Shingles only develops in people who:  Have had chickenpox.  Have received the chickenpox vaccine. (This is rare.)  What are the causes? Shingles is caused by varicella-zoster virus (VZV). This is the same virus that causes chickenpox. After exposure to VZV, the virus stays in the body in an inactive (dormant) state. Shingles develops if the virus reactivates. This can happen many years after the initial exposure to VZV. It is not known what causes this virus to reactivate. What increases the risk? People who have had chickenpox or received the chickenpox vaccine are at risk for shingles. Infection is more common in people who:  Are older than age 23.  Have a weakened defense (immune) system, such as those with HIV, AIDS, or cancer.  Are taking medicines that weaken the immune system, such as transplant medicines.  Are under great stress.  What are the signs or symptoms? Early symptoms of this condition include itching, tingling, and pain in an area on your skin. Pain may be described as burning, stabbing, or throbbing. A few days or weeks after symptoms start, a painful red rash  appears, usually on one side of the body in a bandlike or beltlike pattern. The rash eventually turns into fluid-filled blisters that break open, scab over, and dry up in about 2-3 weeks. At any time during the infection, you may also develop:  A fever.  Chills.  A headache.  An upset stomach.  How is this diagnosed? This condition is diagnosed with a skin exam. Sometimes, skin or fluid samples are taken from the blisters before a diagnosis is made. These samples are examined under a microscope or sent to a lab for testing. How is this treated? There is no specific cure for this condition. Your health care provider will probably prescribe medicines to help you manage pain, recover more quickly, and avoid long-term problems. Medicines may include:  Antiviral drugs.  Anti-inflammatory drugs.  Pain medicines.  If the area involved is on your face, you may be referred to a specialist, such as an eye doctor (ophthalmologist) or an ear, nose, and throat (ENT) doctor to help you avoid eye problems, chronic pain, or disability. Follow these instructions at home: Medicines  Take medicines only as directed by your health care provider.  Apply an anti-itch or numbing cream to the affected area as directed by your health care provider. Blister and Rash Care  Take a cool bath or apply cool compresses to the area of the rash or blisters as directed by your health care provider. This may help with pain and itching.  Keep your rash covered with a loose bandage (dressing). Wear loose-fitting clothing  to help ease the pain of material rubbing against the rash.  Keep your rash and blisters clean with mild soap and cool water or as directed by your health care provider.  Check your rash every day for signs of infection. These include redness, swelling, and pain that lasts or increases.  Do not pick your blisters.  Do not scratch your rash. General instructions  Rest as directed by your health  care provider.  Keep all follow-up visits as directed by your health care provider. This is important.  Until your blisters scab over, your infection can cause chickenpox in people who have never had it or been vaccinated against it. To prevent this from happening, avoid contact with other people, especially: ? Babies. ? Pregnant women. ? Children who have eczema. ? Elderly people who have transplants. ? People who have chronic illnesses, such as leukemia or AIDS. Contact a health care provider if:  Your pain is not relieved with prescribed medicines.  Your pain does not get better after the rash heals.  Your rash looks infected. Signs of infection include redness, swelling, and pain that lasts or increases. Get help right away if:  The rash is on your face or nose.  You have facial pain, pain around your eye area, or loss of feeling on one side of your face.  You have ear pain or you have ringing in your ear.  You have loss of taste.  Your condition gets worse. This information is not intended to replace advice given to you by your health care provider. Make sure you discuss any questions you have with your health care provider. Document Released: 01/13/2005 Document Revised: 09/09/2015 Document Reviewed: 11/24/2013 Elsevier Interactive Patient Education  2018 [employer].    IF you received an x-ray today, you will receive an invoice from [facility]. Please contact [facility] at [phone] with questions or concerns regarding your invoice.   IF you received labwork today, you will receive an invoice from [facility]. Please contact [facility] at [phone] with questions or concerns regarding your invoice.   Our billing staff will not be able to assist you with questions regarding bills from these companies.  You will be contacted with the lab results as soon as they are available. The fastest way to get your results is to activate your My Chart account.  Instructions are located on the last page of this paperwork. If you have not heard from [place] regarding the results in 2 weeks, please contact this office.

## 2017-04-28 NOTE — Progress Notes (Signed)
Subjective:  By signing my name below, I, Moises Blood, attest that this documentation has been prepared under the direction and in the presence of [name], MD. Electronically Signed: Moises Blood, [place]. 04/28/2017 , 2:28 PM .  Patient was seen in Room 2 .   Patient ID: Nathan Mora, male    DOB: 1945-05-03, 72 y.o.   MRN: [id number] Chief Complaint  Patient presents with  . possible bug bites around chest area   HPI Nathan Mora is a 72 y.o. male Here with a rash. I last saw patient in 2017. His last meal was oatmeal at around 11:00AM.   Patient states it started with a discomfort in his right armpit roughly 8-10 days ago. When he was getting dressed today, he noticed a few bumps over his right breast. He denies having any raw feeling. He doesn't recall getting bit by anything. He notes having soreness feeling worse and has caused him to wake up at night. He did some research online and thought it was due to seasonal allergies towards pollen, so he took a benadryl, which improved the soreness for about an hour. He denies any itchiness over the area. He denies any other rash. He received shingles vaccine, Zostervax, in 2010. He mentions having shingles twice, many years ago.   He had an annual wellness visit with Dewitt Hoes in Oct 2018 with blood work done. He is still taking his Lipitor for hyperlipidemia.   Patient Active Problem List   Diagnosis Date Noted  . Family history of early CAD 04/23/2012  . Hyperlipidemia   . Glaucoma   . Lipid disorder 03/29/2012   Past Medical History:  Diagnosis Date  . Glaucoma    left eye  . Hyperlipidemia   . Lipid disorder 03/29/2012   No past surgical history on file. No Known Allergies Prior to Admission medications   Medication Sig Start Date End Date Taking? Authorizing Provider  atorvastatin (LIPITOR) 40 MG tablet Take 0.5 tablets (20 mg total) daily at 6 PM by mouth. 12/02/16   [name], MD  brimonidine-timolol  (COMBIGAN) 0.2-0.5 % ophthalmic solution Place 1 drop into both eyes every 12 (twelve) hours.    [provider]  latanoprost (XALATAN) 0.005 % ophthalmic solution 1 drop as directed.    [provider]  sildenafil (REVATIO) 20 MG tablet 1 to 3 tabs up to QD prior to onset of sexual activity. 04/04/16   [name], MD   Social History   Socioeconomic History  . Marital status: Married    Spouse name: Not on file  . Number of children: Not on file  . Years of education: Not on file  . Highest education level: Not on file  Occupational History  . Not on file  Social Needs  . Financial resource strain: Not on file  . Food insecurity:    Worry: Not on file    Inability: Not on file  . Transportation needs:    Medical: Not on file    Non-medical: Not on file  Tobacco Use  . Smoking status: Never Smoker  . Smokeless tobacco: Never Used  Substance and Sexual Activity  . Alcohol use: Yes    Alcohol/week: 2.4 oz    Types: 4 Cans of beer per week  . Drug use: No  . Sexual activity: Yes  Lifestyle  . Physical activity:    Days per week: Not on file    Minutes per session: Not on file  . Stress:  Not on file  Relationships  . Social connections:    Talks on phone: Not on file    Gets together: Not on file    Attends religious service: Not on file    Active member of club or organization: Not on file    Attends meetings of clubs or organizations: Not on file    Relationship status: Not on file  . Intimate partner violence:    Fear of current or ex partner: Not on file    Emotionally abused: Not on file    Physically abused: Not on file    Forced sexual activity: Not on file  Other Topics Concern  . Not on file  Social History Narrative  . Not on file   Review of Systems  Constitutional: Negative for fatigue, fever and unexpected weight change.  Eyes: Negative for visual disturbance.  Respiratory: Negative for cough, chest tightness and shortness of  breath.   Cardiovascular: Negative for chest pain, palpitations and leg swelling.  Gastrointestinal: Negative for abdominal pain and blood in stool.  Musculoskeletal: Positive for myalgias (right axilla).  Skin: Positive for rash.  Neurological: Negative for dizziness, light-headedness and headaches.       Objective:   Physical Exam  Constitutional: He is oriented to person, place, and time. He appears well-developed and well-nourished. No distress.  HENT:  Head: Normocephalic and atraumatic.  Eyes: Pupils are equal, round, and reactive to light. EOM are normal.  Neck: Neck supple.  Cardiovascular: Normal rate.  Pulmonary/Chest: Effort normal. No respiratory distress.  Musculoskeletal: Normal range of motion.  Neurological: He is alert and oriented to person, place, and time.  Skin: Skin is warm and dry.  Tenderness along right anterior chest wall just below the axilla, seems to be a linear erythematous patch with possible faint vesicles, there are two patches with overlying erythema, no rash over back or middle of chest of same dermatone  Psychiatric: He has a normal mood and affect. His behavior is normal.  Nursing note and vitals reviewed.   Vitals:   04/28/17 1413  BP: (!) 142/88  Pulse: 62  Resp: 17  Temp: 98.4 F (36.9 C)  TempSrc: Oral  SpO2: 98%  Weight: 188 lb (85.3 kg)  Height: 5\' 8"  (1.727 m)       Assessment & Plan:    Nathan Mora is a 72 y.o. male Rash and nonspecific skin eruption - Plan: traMADol (ULTRAM) 50 MG tablet  Chest wall pain - Plan: traMADol (ULTRAM) 50 MG tablet  Rash and symptoms appear consistent with herpes zoster.  Unfortunately due to timing of symptoms, unlikely benefit of Valtrex at this time.   - Symptomatic care discussed with tramadol as needed for pain, RTC precautions if any spread or worsening of rash as well as if persistent pain in that area.  -Would delay repeat shingles vaccination for at least 6 months, but likely has some  protection from this flare for next 2 to 3 years.  Meds ordered this encounter  Medications  . traMADol (ULTRAM) 50 MG tablet    Sig: Take 1 tablet (50 mg total) by mouth every 8 (eight) hours as needed.    Dispense:  20 tablet    Refill:  0   Patient Instructions    Your rash and pain appear to be due to shingles. Unfortunately Valtrex may not provide any benefit at this time. Keep area clean with soap and water, cover if needed. Tramadol if needed for pain. Would delay repeat  shingles vaccine for at least 6 months as you likely will have some protection form recent flair. If rash spreads, or worsening as we discussed, please return for recheck. Follow up with me in next 1 month for cholesterol (fasting labs that day). Thanks for coming in today.   Return to the clinic or go to the nearest emergency room if any of your symptoms worsen or new symptoms occur.   Shingles Shingles, which is also known as herpes zoster, is an infection that causes a painful skin rash and fluid-filled blisters. Shingles is not related to genital herpes, which is a sexually transmitted infection. Shingles only develops in people who:  Have had chickenpox.  Have received the chickenpox vaccine. (This is rare.)  What are the causes? Shingles is caused by varicella-zoster virus (VZV). This is the same virus that causes chickenpox. After exposure to VZV, the virus stays in the body in an inactive (dormant) state. Shingles develops if the virus reactivates. This can happen many years after the initial exposure to VZV. It is not known what causes this virus to reactivate. What increases the risk? People who have had chickenpox or received the chickenpox vaccine are at risk for shingles. Infection is more common in people who:  Are older than age 37.  Have a weakened defense (immune) system, such as those with HIV, AIDS, or cancer.  Are taking medicines that weaken the immune system, such as transplant  medicines.  Are under great stress.  What are the signs or symptoms? Early symptoms of this condition include itching, tingling, and pain in an area on your skin. Pain may be described as burning, stabbing, or throbbing. A few days or weeks after symptoms start, a painful red rash appears, usually on one side of the body in a bandlike or beltlike pattern. The rash eventually turns into fluid-filled blisters that break open, scab over, and dry up in about 2-3 weeks. At any time during the infection, you may also develop:  A fever.  Chills.  A headache.  An upset stomach.  How is this diagnosed? This condition is diagnosed with a skin exam. Sometimes, skin or fluid samples are taken from the blisters before a diagnosis is made. These samples are examined under a microscope or sent to a lab for testing. How is this treated? There is no specific cure for this condition. Your health care provider will probably prescribe medicines to help you manage pain, recover more quickly, and avoid long-term problems. Medicines may include:  Antiviral drugs.  Anti-inflammatory drugs.  Pain medicines.  If the area involved is on your face, you may be referred to a specialist, such as an eye doctor (ophthalmologist) or an ear, nose, and throat (ENT) doctor to help you avoid eye problems, chronic pain, or disability. Follow these instructions at home: Medicines  Take medicines only as directed by your health care provider.  Apply an anti-itch or numbing cream to the affected area as directed by your health care provider. Blister and Rash Care  Take a cool bath or apply cool compresses to the area of the rash or blisters as directed by your health care provider. This may help with pain and itching.  Keep your rash covered with a loose bandage (dressing). Wear loose-fitting clothing to help ease the pain of material rubbing against the rash.  Keep your rash and blisters clean with mild soap and  cool water or as directed by your health care provider.  Check your rash every  day for signs of infection. These include redness, swelling, and pain that lasts or increases.  Do not pick your blisters.  Do not scratch your rash. General instructions  Rest as directed by your health care provider.  Keep all follow-up visits as directed by your health care provider. This is important.  Until your blisters scab over, your infection can cause chickenpox in people who have never had it or been vaccinated against it. To prevent this from happening, avoid contact with other people, especially: ? Babies. ? Pregnant women. ? Children who have eczema. ? Elderly people who have transplants. ? People who have chronic illnesses, such as leukemia or AIDS. Contact a health care provider if:  Your pain is not relieved with prescribed medicines.  Your pain does not get better after the rash heals.  Your rash looks infected. Signs of infection include redness, swelling, and pain that lasts or increases. Get help right away if:  The rash is on your face or nose.  You have facial pain, pain around your eye area, or loss of feeling on one side of your face.  You have ear pain or you have ringing in your ear.  You have loss of taste.  Your condition gets worse. This information is not intended to replace advice given to you by your health care provider. Make sure you discuss any questions you have with your health care provider. Document Released: 01/13/2005 Document Revised: 09/09/2015 Document Reviewed: 11/24/2013 Elsevier Interactive Patient Education  2018 [employer].    IF you received an x-ray today, you will receive an invoice from [facility]. Please contact [facility] at [phone] with questions or concerns regarding your invoice.   IF you received labwork today, you will receive an invoice from [place]. Please contact [facility] at [phone] with questions  or concerns regarding your invoice.   Our billing staff will not be able to assist you with questions regarding bills from these companies.  You will be contacted with the lab results as soon as they are available. The fastest way to get your results is to activate your My Chart account. Instructions are located on the last page of this paperwork. If you have not heard from [place] regarding the results in 2 weeks, please contact this office.       I personally performed the services described in this documentation, which was scribed in my presence. The recorded information has been reviewed and considered for accuracy and completeness, addended by me as needed, and agree with information above.  Signed,   [name], MD [facility] [place].  05/01/17 1:16 PM

## 2017-05-01 ENCOUNTER — Encounter: Payer: Self-pay | Admitting: Family Medicine

## 2017-05-28 DIAGNOSIS — H401111 Primary open-angle glaucoma, right eye, mild stage: Secondary | ICD-10-CM | POA: Diagnosis not present

## 2017-05-28 DIAGNOSIS — H2513 Age-related nuclear cataract, bilateral: Secondary | ICD-10-CM | POA: Diagnosis not present

## 2017-05-28 DIAGNOSIS — H401123 Primary open-angle glaucoma, left eye, severe stage: Secondary | ICD-10-CM | POA: Diagnosis not present

## 2017-06-01 ENCOUNTER — Encounter: Payer: Self-pay | Admitting: Family Medicine

## 2017-06-01 ENCOUNTER — Ambulatory Visit (INDEPENDENT_AMBULATORY_CARE_PROVIDER_SITE_OTHER): Payer: Medicare HMO | Admitting: Family Medicine

## 2017-06-01 ENCOUNTER — Other Ambulatory Visit: Payer: Self-pay

## 2017-06-01 VITALS — BP 122/70 | HR 60 | Temp 98.2°F | Ht 67.52 in | Wt 189.6 lb

## 2017-06-01 DIAGNOSIS — N529 Male erectile dysfunction, unspecified: Secondary | ICD-10-CM | POA: Diagnosis not present

## 2017-06-01 DIAGNOSIS — E785 Hyperlipidemia, unspecified: Secondary | ICD-10-CM | POA: Diagnosis not present

## 2017-06-01 DIAGNOSIS — H409 Unspecified glaucoma: Secondary | ICD-10-CM | POA: Diagnosis not present

## 2017-06-01 LAB — COMPREHENSIVE METABOLIC PANEL
ALBUMIN: 4.6 g/dL (ref 3.5–4.8)
ALK PHOS: 50 IU/L (ref 39–117)
ALT: 30 IU/L (ref 0–44)
AST: 26 IU/L (ref 0–40)
Albumin/Globulin Ratio: 2.7 — ABNORMAL HIGH (ref 1.2–2.2)
BUN/Creatinine Ratio: 19 (ref 10–24)
BUN: 19 mg/dL (ref 8–27)
Bilirubin Total: 0.4 mg/dL (ref 0.0–1.2)
CO2: 22 mmol/L (ref 20–29)
CREATININE: 0.98 mg/dL (ref 0.76–1.27)
Calcium: 9.2 mg/dL (ref 8.6–10.2)
Chloride: 104 mmol/L (ref 96–106)
GFR calc Af Amer: 90 mL/min/{1.73_m2} (ref >59)
GFR calc non Af Amer: 78 mL/min/{1.73_m2} (ref >59)
GLUCOSE: 102 mg/dL — AB (ref 65–99)
Globulin, Total: 1.7 g/dL (ref 1.5–4.5)
Potassium: 5 mmol/L (ref 3.5–5.2)
Sodium: 141 mmol/L (ref 134–144)
Total Protein: 6.3 g/dL (ref 6.0–8.5)

## 2017-06-01 LAB — LIPID PANEL
CHOLESTEROL TOTAL: 168 mg/dL (ref 100–199)
Chol/HDL Ratio: 3.3 ratio (ref 0.0–5.0)
HDL: 51 mg/dL (ref >39)
LDL CALC: 95 mg/dL (ref 0–99)
TRIGLYCERIDES: 109 mg/dL (ref 0–149)
VLDL CHOLESTEROL CAL: 22 mg/dL (ref 5–40)

## 2017-06-01 MED ORDER — SILDENAFIL CITRATE 20 MG PO TABS: ORAL_TABLET | ORAL | 0 refills | Status: DC

## 2017-06-01 MED ORDER — ATORVASTATIN CALCIUM 40 MG PO TABS: 20.0000 mg | ORAL_TABLET | Freq: Every day | ORAL | 1 refills | Status: DC

## 2017-06-01 NOTE — Progress Notes (Signed)
Subjective:  By signing my name below, I, Nathan Mora, attest that this documentation has been prepared under the direction and in the presence of Nathan Ray, MD. Electronically Signed: [name], [place]. 06/01/2017 , 9:17 AM .  Patient was seen in Room 10 .   Patient ID: Nathan Mora, male    DOB: 05-25-1945, 72 y.o.   MRN: [id number] Chief Complaint  Patient presents with  . Chonic condition    1 month follow up    HPI Nathan Mora is a 72 y.o. male Here for follow up for hyperlipidemia. He is fasting this morning.   Hyperlipidemia He takes Lipitor 20mg  QD. He denies any new muscle aches or joint pain. He denies chest pain, headaches or dizziness. He has family history of early CAD, but no personal history of heart disease.   Lab Results  Component Value Date   CHOL 195 11/13/2016   HDL 48 11/13/2016   LDLCALC 107 (H) 11/13/2016   TRIG 199 (H) 11/13/2016   CHOLHDL 4.1 11/13/2016    ED Patient takes sildenafil, though not very often. He states, initially taking 2 tablets at a time with some flushing. He switched down to 1 tablet, and doesn't have any further complications. He denies blue tint vision, hearing problems or chest pain during activity.   Shingles He was seen a month ago (04/28/17) for rash. He states his shingles cleared up and pain resolved after 10 days from last visit.   Patient Active Problem List   Diagnosis Date Noted  . Family history of early CAD 04/23/2012  . Hyperlipidemia   . Glaucoma   . Lipid disorder 03/29/2012   Past Medical History:  Diagnosis Date  . Glaucoma    left eye  . Hyperlipidemia   . Lipid disorder 03/29/2012   No past surgical history on file. No Known Allergies Prior to Admission medications   Medication Sig Start Date End Date Taking? Authorizing Provider  atorvastatin (LIPITOR) 40 MG tablet Take 0.5 tablets (20 mg total) daily at 6 PM by mouth. 12/02/16   [name], MD  brimonidine-timolol (COMBIGAN) 0.2-0.5  % ophthalmic solution Place 1 drop into both eyes every 12 (twelve) hours.    [provider]  latanoprost (XALATAN) 0.005 % ophthalmic solution 1 drop as directed.    [provider]  sildenafil (REVATIO) 20 MG tablet 1 to 3 tabs up to QD prior to onset of sexual activity. 04/04/16   [name], MD  traMADol (ULTRAM) 50 MG tablet Take 1 tablet (50 mg total) by mouth every 8 (eight) hours as needed. 04/28/17   [name], MD   Social History   Socioeconomic History  . Marital status: Married    Spouse name: Not on file  . Number of children: Not on file  . Years of education: Not on file  . Highest education level: Not on file  Occupational History  . Not on file  Social Needs  . Financial resource strain: Not on file  . Food insecurity:    Worry: Not on file    Inability: Not on file  . Transportation needs:    Medical: Not on file    Non-medical: Not on file  Tobacco Use  . Smoking status: Never Smoker  . Smokeless tobacco: Never Used  Substance and Sexual Activity  . Alcohol use: Yes    Alcohol/week: 2.4 oz    Types: 4 Cans of beer per week  . Drug use: No  .  Sexual activity: Yes  Lifestyle  . Physical activity:    Days per week: Not on file    Minutes per session: Not on file  . Stress: Not on file  Relationships  . Social connections:    Talks on phone: Not on file    Gets together: Not on file    Attends religious service: Not on file    Active member of club or organization: Not on file    Attends meetings of clubs or organizations: Not on file    Relationship status: Not on file  . Intimate partner violence:    Fear of current or ex partner: Not on file    Emotionally abused: Not on file    Physically abused: Not on file    Forced sexual activity: Not on file  Other Topics Concern  . Not on file  Social History Narrative  . Not on file   Review of Systems  Constitutional: Negative for fatigue and unexpected weight change.    Eyes: Negative for visual disturbance.  Respiratory: Negative for cough, chest tightness and shortness of breath.   Cardiovascular: Negative for chest pain, palpitations and leg swelling.  Gastrointestinal: Negative for abdominal pain and Mora in stool.  Musculoskeletal: Negative for arthralgias and myalgias.  Skin: Negative for rash.  Neurological: Negative for dizziness, light-headedness and headaches.       Objective:   Physical Exam  Constitutional: He is oriented to person, place, and time. He appears well-developed and well-nourished.  HENT:  Head: Normocephalic and atraumatic.  Eyes: Pupils are equal, round, and reactive to light. EOM are normal.  Neck: No JVD present. Carotid bruit is not present.  Cardiovascular: Normal rate, regular rhythm and normal heart sounds.  No murmur heard. Pulmonary/Chest: Effort normal and breath sounds normal. He has no rales.  Musculoskeletal: He exhibits no edema.  Neurological: He is alert and oriented to person, place, and time.  Skin: Skin is warm and dry.  Psychiatric: He has a normal mood and affect.  Vitals reviewed.   Vitals:   06/01/17 0843  BP: 122/70  Pulse: 60  Temp: 98.2 F (36.8 C)  TempSrc: Oral  SpO2: 96%  Weight: 189 lb 9.6 oz (86 kg)  Height: 5' 7.52" (1.715 m)       Assessment & Plan:   Nathan Mora is a 72 y.o. male Hyperlipidemia, unspecified hyperlipidemia type - Plan: Comprehensive metabolic panel, Lipid panel  - tolerating lipitor at 20mg  qd. Labs pending.   Glaucoma -  - on gtts, continue follow up with gastroenterology.   Erectile dysfunction, unspecified erectile dysfunction type  - sildenafil Rx given - use lowest effective dose. Side effects discussed (including but not limited to headache/flushing, blue discoloration of vision, possible vascular steal and risk of cardiac effects if underlying unknown coronary artery disease, and permanent sensorineural hearing loss). Understanding  expressed.   Meds ordered this encounter  Medications  . sildenafil (REVATIO) 20 MG tablet    Sig: 1 to 3 tabs up to QD prior to onset of sexual activity.    Dispense:  30 tablet    Refill:  0  . atorvastatin (LIPITOR) 40 MG tablet    Sig: Take 0.5 tablets (20 mg total) by mouth daily at 6 PM.    Dispense:  90 tablet    Refill:  1   Patient Instructions   Thank you for coming in today. meds refilled, please follow up in 6 months for a physical.  IF you received an x-Mora today, you will receive an invoice from [facility]. Please contact [facility] at [phone] with questions or concerns regarding your invoice.   IF you received labwork today, you will receive an invoice from [place]. Please contact [facility] at [phone] with questions or concerns regarding your invoice.   Our billing staff will not be able to assist you with questions regarding bills from these companies.  You will be contacted with the lab results as soon as they are available. The fastest way to get your results is to activate your My Chart account. Instructions are located on the last page of this paperwork. If you have not heard from [place] regarding the results in 2 weeks, please contact this office.       I personally performed the services described in this documentation, which was scribed in my presence. The recorded information has been reviewed and considered for accuracy and completeness, addended by me as needed, and agree with information above.  Signed,   Nathan Ray, MD [facility] [place].  06/01/17 9:20 AM

## 2017-06-01 NOTE — Patient Instructions (Addendum)
Thank you for coming in today. meds refilled, please follow up in 6 months for a physical.     IF you received an x-ray today, you will receive an invoice from [facility]. Please contact [facility] at [phone] with questions or concerns regarding your invoice.   IF you received labwork today, you will receive an invoice from [place]. Please contact [facility] at [phone] with questions or concerns regarding your invoice.   Our billing staff will not be able to assist you with questions regarding bills from these companies.  You will be contacted with the lab results as soon as they are available. The fastest way to get your results is to activate your My Chart account. Instructions are located on the last page of this paperwork. If you have not heard from [place] regarding the results in 2 weeks, please contact this office.

## 2017-08-20 DIAGNOSIS — H524 Presbyopia: Secondary | ICD-10-CM | POA: Diagnosis not present

## 2017-08-20 DIAGNOSIS — H52223 Regular astigmatism, bilateral: Secondary | ICD-10-CM | POA: Diagnosis not present

## 2017-08-20 DIAGNOSIS — H5213 Myopia, bilateral: Secondary | ICD-10-CM | POA: Diagnosis not present

## 2017-11-06 DIAGNOSIS — R69 Illness, unspecified: Secondary | ICD-10-CM | POA: Diagnosis not present

## 2017-12-03 ENCOUNTER — Encounter: Payer: Medicare HMO | Admitting: Family Medicine

## 2017-12-04 ENCOUNTER — Ambulatory Visit (INDEPENDENT_AMBULATORY_CARE_PROVIDER_SITE_OTHER): Payer: Medicare HMO | Admitting: Family Medicine

## 2017-12-04 ENCOUNTER — Encounter: Payer: Self-pay | Admitting: Family Medicine

## 2017-12-04 VITALS — BP 132/84 | HR 60 | Temp 98.5°F | Ht 68.0 in | Wt 191.8 lb

## 2017-12-04 DIAGNOSIS — R350 Frequency of micturition: Secondary | ICD-10-CM

## 2017-12-04 DIAGNOSIS — H409 Unspecified glaucoma: Secondary | ICD-10-CM | POA: Diagnosis not present

## 2017-12-04 DIAGNOSIS — Z125 Encounter for screening for malignant neoplasm of prostate: Secondary | ICD-10-CM

## 2017-12-04 DIAGNOSIS — R739 Hyperglycemia, unspecified: Secondary | ICD-10-CM | POA: Diagnosis not present

## 2017-12-04 DIAGNOSIS — R351 Nocturia: Secondary | ICD-10-CM | POA: Diagnosis not present

## 2017-12-04 DIAGNOSIS — E785 Hyperlipidemia, unspecified: Secondary | ICD-10-CM

## 2017-12-04 DIAGNOSIS — Z0000 Encounter for general adult medical examination without abnormal findings: Secondary | ICD-10-CM

## 2017-12-04 DIAGNOSIS — N529 Male erectile dysfunction, unspecified: Secondary | ICD-10-CM | POA: Diagnosis not present

## 2017-12-04 LAB — POCT URINALYSIS DIP (MANUAL ENTRY)
Bilirubin, UA: NEGATIVE
Blood, UA: NEGATIVE
GLUCOSE UA: NEGATIVE mg/dL
Ketones, POC UA: NEGATIVE mg/dL
Leukocytes, UA: NEGATIVE
NITRITE UA: NEGATIVE
PROTEIN UA: NEGATIVE mg/dL
Spec Grav, UA: 1.015 (ref 1.010–1.025)
UROBILINOGEN UA: 0.2 U/dL
pH, UA: 7 (ref 5.0–8.0)

## 2017-12-04 MED ORDER — ATORVASTATIN CALCIUM 40 MG PO TABS: 20.0000 mg | ORAL_TABLET | Freq: Every day | ORAL | 1 refills | Status: DC

## 2017-12-04 MED ORDER — DOXAZOSIN MESYLATE 1 MG PO TABS: 1.0000 mg | ORAL_TABLET | Freq: Every day | ORAL | 2 refills | Status: DC

## 2017-12-04 MED ORDER — SILDENAFIL CITRATE 20 MG PO TABS: ORAL_TABLET | ORAL | 0 refills | Status: DC

## 2017-12-04 NOTE — Progress Notes (Signed)
Subjective:  By signing my name below, I, Nathan Mora, attest that this documentation has been prepared under the direction and in the presence of Nathan Agreste, MD Electronically Signed: Ladene Artist, ED Scribe 12/04/2017 at 9:31 AM.   Patient ID: Nathan Mora, male    DOB: April 07, 1945, 72 y.o.   MRN: [id number]  Chief Complaint  Patient presents with  . Annual Exam    CPE   HPI [name] "Nathan Mora" is a 72 y.o. male who presents to Primary Care at [facility] for an annual exam. H/o hyperlipidemia, glaucoma, ED, insomnia.  Hyperlipidemia Lab Results  Component Value Date   CHOL 168 06/01/2017   HDL 51 06/01/2017   LDLCALC 95 06/01/2017   TRIG 109 06/01/2017   CHOLHDL 3.3 06/01/2017   Lab Results  Component Value Date   ALT 30 06/01/2017   AST 26 06/01/2017   ALKPHOS 50 06/01/2017   BILITOT 0.4 06/01/2017  Lipitor 40 mg qd. - Denies new side-effects.  Hyperglycemia Borderline glucose of 102 in May.  ED Has used sildenafil 1-2 tabs prn. Did have some facial flushing with higher doses. - Pt typically takes 1 tab at a time. Denies cyanopsia, hearing loss, cp, chest tightness or sob on exertion.  Glaucoma Xalatan and combigan.  Insomnia Pt reports difficulty staying asleep over the past 9-12 months. Wakes at least twice nightly to urinate but some nights he doesn't wake at all. Also reports 1 day last wk where he urinated 3 times within 1 hour, but he doesn't remember what he drunk or the amount of liquids he had prior. Denies difficulty falling asleep, freq urinary since that day. He has tried 1/2 tab of his wife's gabapentin 5 out of 7 days/wk.  CA Screening Colonoscopy: 01/01/16, rpt 10 yrs Prostate CA Screening: PSA 1.99 in 2015 - Pt reports nocturia and 1 day or urinary freq last wk which has resolved. He would like PSA and DRE today. Lab Results  Component Value Date   PSA 1.99 10/11/2013   PSA 2.21 03/29/2012  Skin CA Screening: h/o melanoma, followed by Dr.  Martinique annually. He does wear a hat while golfing but noticed a spot on the R side of his head which isn't covered by his hat. Plans to have this eval within the new month at his next appointment with Dr. Martinique. Reports he last had an area removed on his skin ~3 yrs ago.  Immunizations Immunization History  Administered Date(s) Administered  . Influenza,inj,Quad PF,6+ Mos 10/11/2013, 09/27/2015  . Influenza-Unspecified 11/06/2017  . Pneumococcal Conjugate-13 10/11/2013  . Pneumococcal Polysaccharide-23 12/12/2006, 09/27/2015  . Td 01/11/2008  . Zoster 02/11/2008  Shingles: pt had shingles in 04/2017; had zostavax prev. - Plans to get shingles vaccine today.  Fall Screening No falls within the past yr.  Depression Screening Depression screen [facility] 2/9 12/04/2017 06/01/2017 04/28/2017 11/13/2016 09/27/2015  Decreased Interest 0 0 0 0 0  Down, Depressed, Hopeless 0 0 0 0 0  PHQ - 2 Score 0 0 0 0 0   Functional Status Survey: Is the patient deaf or have difficulty hearing?: No Does the patient have difficulty seeing, even when wearing glasses/contacts?: Yes Does the patient have difficulty concentrating, remembering, or making decisions?: No Does the patient have difficulty walking or climbing stairs?: No Does the patient have difficulty dressing or bathing?: No Does the patient have difficulty doing errands alone such as visiting a doctor's office or shopping?: No Vision: h/o glaucoma; followed by Optho Dr. Sharen Counter  Mental Status Screening 6CIT Screen 12/04/2017 11/13/2016  What Year? 0 points 0 points  What month? 0 points 0 points  What time? 0 points 0 points  Count back from 20 2 points 0 points  Months in reverse 0 points 0 points  Repeat phrase 0 points 0 points  Total Score 2 0     Visual Acuity Screening   Right eye Left eye Both eyes  Without correction: 20/100  20/50  With correction:  20/100    Vision: H/o glaucoma. Followed by Dr. Sharen Counter Dentist: every 6 months Exercise:  golfs and walks 3-4 miles/day  Advanced Directives  Has a HCPOA. Copy requested.  Patient Active Problem List   Diagnosis Date Noted  . Family history of early CAD 04/23/2012  . Hyperlipidemia   . Glaucoma   . Lipid disorder 03/29/2012   Past Medical History:  Diagnosis Date  . Glaucoma    left eye  . Hyperlipidemia   . Lipid disorder 03/29/2012   No past surgical history on file. No Known Allergies Prior to Admission medications   Medication Sig Start Date End Date Taking? Authorizing Provider  atorvastatin (LIPITOR) 40 MG tablet Take 0.5 tablets (20 mg total) by mouth daily at 6 PM. 06/01/17   Nathan Agreste, MD  brimonidine-timolol (COMBIGAN) 0.2-0.5 % ophthalmic solution Place 1 drop into both eyes every 12 (twelve) hours.    [provider]  latanoprost (XALATAN) 0.005 % ophthalmic solution 1 drop as directed.    [provider]  sildenafil (REVATIO) 20 MG tablet 1 to 3 tabs up to QD prior to onset of sexual activity. 06/01/17   Nathan Agreste, MD   Social History   Socioeconomic History  . Marital status: Married    Spouse name: Not on file  . Number of children: Not on file  . Years of education: Not on file  . Highest education level: Not on file  Occupational History  . Not on file  Social Needs  . Financial resource strain: Not on file  . Food insecurity:    Worry: Not on file    Inability: Not on file  . Transportation needs:    Medical: Not on file    Non-medical: Not on file  Tobacco Use  . Smoking status: Never Smoker  . Smokeless tobacco: Never Used  Substance and Sexual Activity  . Alcohol use: Yes    Alcohol/week: 4.0 standard drinks    Types: 4 Cans of beer per week  . Drug use: No  . Sexual activity: Yes  Lifestyle  . Physical activity:    Days per week: Not on file    Minutes per session: Not on file  . Stress: Not on file  Relationships  . Social connections:    Talks on phone: Not on file    Gets together: Not on  file    Attends religious service: Not on file    Active member of club or organization: Not on file    Attends meetings of clubs or organizations: Not on file    Relationship status: Not on file  . Intimate partner violence:    Fear of current or ex partner: Not on file    Emotionally abused: Not on file    Physically abused: Not on file    Forced sexual activity: Not on file  Other Topics Concern  . Not on file  Social History Narrative  . Not on file   Review of Systems  HENT:  Negative for hearing loss.   Eyes: Negative for visual disturbance.  Respiratory: Negative for chest tightness and shortness of breath.   Cardiovascular: Negative for chest pain.  Genitourinary: Positive for frequency.  Psychiatric/Behavioral: Positive for sleep disturbance.      Objective:   Physical Exam  Constitutional: He is oriented to person, place, and time. He appears well-developed and well-nourished.  HENT:  Head: Normocephalic and atraumatic.  Right Ear: External ear normal.  Left Ear: External ear normal.  Mouth/Throat: Oropharynx is clear and moist.  Eyes: Pupils are equal, round, and reactive to light. Conjunctivae and EOM are normal.  Neck: Normal range of motion. Neck supple. No thyromegaly present.  Cardiovascular: Normal rate, regular rhythm, normal heart sounds and intact distal pulses.  Pulmonary/Chest: Effort normal and breath sounds normal. No respiratory distress. He has no wheezes.  Abdominal: Soft. He exhibits no distension. There is no tenderness. Hernia confirmed negative in the right inguinal area and confirmed negative in the left inguinal area.  Genitourinary: Prostate normal.  Musculoskeletal: Normal range of motion. He exhibits no edema or tenderness.  Lymphadenopathy:    He has no cervical adenopathy.  Neurological: He is alert and oriented to person, place, and time. He has normal reflexes.  Skin: Skin is warm and dry.  Psychiatric: He has a normal mood and affect.  His behavior is normal.  Vitals reviewed.   Vitals:   12/04/17 0906  BP: 132/84  Pulse: 60  SpO2: 98%  Weight: 191 lb 12.8 oz (87 kg)  Height: _0  (1.727 m)      Assessment & Plan:   Felicia Both is a 72 y.o. male Annual physical exam  - -anticipatory guidance as below in AVS, screening labs above. Health maintenance items as above in HPI discussed/recommended as applicable.   Hyperlipidemia, unspecified hyperlipidemia type - Plan: Lipid panel  -Plan: atorvastatin (LIPITOR) 40 MG tablet  - Stable, tolerating current regimen. Medications refilled. Labs pending as above.   Hyperglycemia - Plan: Comprehensive metabolic panel, Hemoglobin A1c  -Borderline previously, check A1c for diabetes/prediabetes screening  Glaucoma, unspecified glaucoma type, unspecified laterality  -Continue drops and follow-up with ophthalmology  Urinary frequency - Plan: PSA, POCT urinalysis dipstick, doxazosin (CARDURA) 1 MG tablet Nocturia - Plan: PSA, POCT urinalysis dipstick, doxazosin (CARDURA) 1 MG tablet  -Trial of Cardura for possible BPH with LUTS.  Potential side effects discussed.  RTC precautions if persistent symptoms.  Recheck in the next 2 to 3 months.  If persistent insomnia can look at other causes.  Special screening, prostate cancer - Plan: PSA, POCT urinalysis dipstick  -We discussed pros and cons of prostate cancer screening, and after this discussion, he chose to have screening done. PSA obtained, and no concerning findings on DRE.   Erectile dysfunction, unspecified erectile dysfunction type - Plan: sildenafil (REVATIO) 20 MG tablet  -Sildenafil Rx given - use lowest effective dose.  Off label use discussed.  Side effects discussed (including but not limited to headache/flushing, blue discoloration of vision, possible vascular steal and risk of cardiac effects if underlying unknown coronary artery disease, and permanent sensorineural hearing loss). Understanding expressed.   Meds  ordered this encounter  Medications  . atorvastatin (LIPITOR) 40 MG tablet    Sig: Take 0.5 tablets (20 mg total) by mouth daily at 6 PM.    Dispense:  90 tablet    Refill:  1  . sildenafil (REVATIO) 20 MG tablet    Sig: 1 to 3 tabs up to QD prior  to onset of sexual activity.    Dispense:  30 tablet    Refill:  0  . doxazosin (CARDURA) 1 MG tablet    Sig: Take 1 tablet (1 mg total) by mouth daily.    Dispense:  30 tablet    Refill:  2   Patient Instructions    No change in lipitor or revatio for now.   I will check prostate test today. You can try doxazosin at bedtime to see if that helps.  If persistent sleep issues, let me know. We can look into to other causes treatments. Recheck in next 2-3 months for this issue.   Keep follow up with dermatology as planned, as well as ophthalmology.   Thanks for coming in today.    Preventive Care 84 Years and Older, Male Preventive care refers to lifestyle choices and visits with your health care provider that can promote health and wellness. What does preventive care include?  A yearly physical exam. This is also called an annual well check.  Dental exams once or twice a year.  Routine eye exams. Ask your health care provider how often you should have your eyes checked.  Personal lifestyle choices, including: ? Daily care of your teeth and gums. ? Regular physical activity. ? Eating a healthy diet. ? Avoiding tobacco and drug use. ? Limiting alcohol use. ? Practicing safe sex. ? Taking low doses of aspirin every day. ? Taking vitamin and mineral supplements as recommended by your health care provider. What happens during an annual well check? The services and screenings done by your health care provider during your annual well check will depend on your age, overall health, lifestyle risk factors, and family history of disease. Counseling Your health care provider may ask you questions about your:  Alcohol use.  Tobacco  use.  Drug use.  Emotional well-being.  Home and relationship well-being.  Sexual activity.  Eating habits.  History of falls.  Memory and ability to understand (cognition).  Work and work Statistician.  Screening You may have the following tests or measurements:  Height, weight, and BMI.  Blood pressure.  Lipid and cholesterol levels. These may be checked every 5 years, or more frequently if you are over 59 years old.  Skin check.  Lung cancer screening. You may have this screening every year starting at age 30 if you have a 30-pack-year history of smoking and currently smoke or have quit within the past 15 years.  Fecal occult blood test (FOBT) of the stool. You may have this test every year starting at age 71.  Flexible sigmoidoscopy or colonoscopy. You may have a sigmoidoscopy every 5 years or a colonoscopy every 10 years starting at age 53.  Prostate cancer screening. Recommendations will vary depending on your family history and other risks.  Hepatitis C blood test.  Hepatitis B blood test.  Sexually transmitted disease (STD) testing.  Diabetes screening. This is done by checking your blood sugar (glucose) after you have not eaten for a while (fasting). You may have this done every 1-3 years.  Abdominal aortic aneurysm (AAA) screening. You may need this if you are a current or former smoker.  Osteoporosis. You may be screened starting at age 85 if you are at high risk.  Talk with your health care provider about your test results, treatment options, and if necessary, the need for more tests. Vaccines Your health care provider may recommend certain vaccines, such as:  Influenza vaccine. This is recommended every year.  Tetanus, diphtheria, and acellular pertussis (Tdap, Td) vaccine. You may need a Td booster every 10 years.  Varicella vaccine. You may need this if you have not been vaccinated.  Zoster vaccine. You may need this after age 44.  Measles,  mumps, and rubella (MMR) vaccine. You may need at least one dose of MMR if you were born in 1957 or later. You may also need a second dose.  Pneumococcal 13-valent conjugate (PCV13) vaccine. One dose is recommended after age 81.  Pneumococcal polysaccharide (PPSV23) vaccine. One dose is recommended after age 67.  Meningococcal vaccine. You may need this if you have certain conditions.  Hepatitis A vaccine. You may need this if you have certain conditions or if you travel or work in places where you may be exposed to hepatitis A.  Hepatitis B vaccine. You may need this if you have certain conditions or if you travel or work in places where you may be exposed to hepatitis B.  Haemophilus influenzae type b (Hib) vaccine. You may need this if you have certain risk factors.  Talk to your health care provider about which screenings and vaccines you need and how often you need them. This information is not intended to replace advice given to you by your health care provider. Make sure you discuss any questions you have with your health care provider. Document Released: 02/09/2015 Document Revised: 10/03/2015 Document Reviewed: 11/14/2014 Elsevier Interactive Patient Education  [employer].    If you have lab work done today you will be contacted with your lab results within the next 2 weeks.  If you have not heard from [place] then please contact us. The fastest way to get your results is to register for My Chart.   IF you received an x-ray today, you will receive an invoice from [facility]. Please contact [facility] at [phone] with questions or concerns regarding your invoice.   IF you received labwork today, you will receive an invoice from [place]. Please contact [facility] at [phone] with questions or concerns regarding your invoice.   Our billing staff will not be able to assist you with questions regarding bills from these companies.  You will be contacted  with the lab results as soon as they are available. The fastest way to get your results is to activate your My Chart account. Instructions are located on the last page of this paperwork. If you have not heard from [place] regarding the results in 2 weeks, please contact this office.    '   I personally performed the services described in this documentation, which was scribed in my presence. The recorded information has been reviewed and considered for accuracy and completeness, addended by me as needed, and agree with information above.  Signed,   [name], MD [facility] [place].  12/06/17 8:46 AM

## 2017-12-04 NOTE — Patient Instructions (Addendum)
No change in lipitor or revatio for now.   I will check prostate test today. You can try doxazosin at bedtime to see if that helps.  If persistent sleep issues, let me know. We can look into to other causes treatments. Recheck in next 2-3 months for this issue.   Keep follow up with dermatology as planned, as well as ophthalmology.   Thanks for coming in today.    Preventive Care 71 Years and Older, Male Preventive care refers to lifestyle choices and visits with your health care provider that can promote health and wellness. What does preventive care include?  A yearly physical exam. This is also called an annual well check.  Dental exams once or twice a year.  Routine eye exams. Ask your health care provider how often you should have your eyes checked.  Personal lifestyle choices, including: ? Daily care of your teeth and gums. ? Regular physical activity. ? Eating a healthy diet. ? Avoiding tobacco and drug use. ? Limiting alcohol use. ? Practicing safe sex. ? Taking low doses of aspirin every day. ? Taking vitamin and mineral supplements as recommended by your health care provider. What happens during an annual well check? The services and screenings done by your health care provider during your annual well check will depend on your age, overall health, lifestyle risk factors, and family history of disease. Counseling Your health care provider may ask you questions about your:  Alcohol use.  Tobacco use.  Drug use.  Emotional well-being.  Home and relationship well-being.  Sexual activity.  Eating habits.  History of falls.  Memory and ability to understand (cognition).  Work and work Statistician.  Screening You may have the following tests or measurements:  Height, weight, and BMI.  Blood pressure.  Lipid and cholesterol levels. These may be checked every 5 years, or more frequently if you are over 22 years old.  Skin check.  Lung cancer  screening. You may have this screening every year starting at age 23 if you have a 30-pack-year history of smoking and currently smoke or have quit within the past 15 years.  Fecal occult blood test (FOBT) of the stool. You may have this test every year starting at age 36.  Flexible sigmoidoscopy or colonoscopy. You may have a sigmoidoscopy every 5 years or a colonoscopy every 10 years starting at age 54.  Prostate cancer screening. Recommendations will vary depending on your family history and other risks.  Hepatitis C blood test.  Hepatitis B blood test.  Sexually transmitted disease (STD) testing.  Diabetes screening. This is done by checking your blood sugar (glucose) after you have not eaten for a while (fasting). You may have this done every 1-3 years.  Abdominal aortic aneurysm (AAA) screening. You may need this if you are a current or former smoker.  Osteoporosis. You may be screened starting at age 67 if you are at high risk.  Talk with your health care provider about your test results, treatment options, and if necessary, the need for more tests. Vaccines Your health care provider may recommend certain vaccines, such as:  Influenza vaccine. This is recommended every year.  Tetanus, diphtheria, and acellular pertussis (Tdap, Td) vaccine. You may need a Td booster every 10 years.  Varicella vaccine. You may need this if you have not been vaccinated.  Zoster vaccine. You may need this after age 71.  Measles, mumps, and rubella (MMR) vaccine. You may need at least one dose of MMR if  you were born in [birth year] or later. You may also need a second dose.  Pneumococcal 13-valent conjugate (PCV13) vaccine. One dose is recommended after age 29.  Pneumococcal polysaccharide (PPSV23) vaccine. One dose is recommended after age 77.  Meningococcal vaccine. You may need this if you have certain conditions.  Hepatitis A vaccine. You may need this if you have certain conditions or if you  travel or work in places where you may be exposed to hepatitis A.  Hepatitis B vaccine. You may need this if you have certain conditions or if you travel or work in places where you may be exposed to hepatitis B.  Haemophilus influenzae type b (Hib) vaccine. You may need this if you have certain risk factors.  Talk to your health care provider about which screenings and vaccines you need and how often you need them. This information is not intended to replace advice given to you by your health care provider. Make sure you discuss any questions you have with your health care provider. Document Released: 02/09/2015 Document Revised: 10/03/2015 Document Reviewed: 11/14/2014 Elsevier Interactive Patient Education  [employer].    If you have lab work done today you will be contacted with your lab results within the next 2 weeks.  If you have not heard from [place] then please contact us. The fastest way to get your results is to register for My Chart.   IF you received an x-ray today, you will receive an invoice from [facility]. Please contact [facility] at [phone] with questions or concerns regarding your invoice.   IF you received labwork today, you will receive an invoice from [place]. Please contact [facility] at [phone] with questions or concerns regarding your invoice.   Our billing staff will not be able to assist you with questions regarding bills from these companies.  You will be contacted with the lab results as soon as they are available. The fastest way to get your results is to activate your My Chart account. Instructions are located on the last page of this paperwork. If you have not heard from [place] regarding the results in 2 weeks, please contact this office.    '

## 2017-12-05 LAB — COMPREHENSIVE METABOLIC PANEL
ALK PHOS: 59 IU/L (ref 39–117)
ALT: 36 IU/L (ref 0–44)
AST: 24 IU/L (ref 0–40)
Albumin/Globulin Ratio: 2.8 — ABNORMAL HIGH (ref 1.2–2.2)
Albumin: 4.8 g/dL (ref 3.5–4.8)
BILIRUBIN TOTAL: 0.7 mg/dL (ref 0.0–1.2)
BUN / CREAT RATIO: 17 (ref 10–24)
BUN: 16 mg/dL (ref 8–27)
CHLORIDE: 101 mmol/L (ref 96–106)
CO2: 25 mmol/L (ref 20–29)
Calcium: 9.6 mg/dL (ref 8.6–10.2)
Creatinine, Ser: 0.94 mg/dL (ref 0.76–1.27)
GFR calc Af Amer: 94 mL/min/{1.73_m2} (ref >59)
GFR calc non Af Amer: 81 mL/min/{1.73_m2} (ref >59)
GLOBULIN, TOTAL: 1.7 g/dL (ref 1.5–4.5)
GLUCOSE: 97 mg/dL (ref 65–99)
Potassium: 4.9 mmol/L (ref 3.5–5.2)
SODIUM: 140 mmol/L (ref 134–144)
Total Protein: 6.5 g/dL (ref 6.0–8.5)

## 2017-12-05 LAB — HEMOGLOBIN A1C
ESTIMATED AVERAGE GLUCOSE: 114 mg/dL
Hgb A1c MFr Bld: 5.6 % (ref 4.8–5.6)

## 2017-12-05 LAB — LIPID PANEL
CHOLESTEROL TOTAL: 174 mg/dL (ref 100–199)
Chol/HDL Ratio: 3 ratio (ref 0.0–5.0)
HDL: 58 mg/dL (ref >39)
LDL Calculated: 95 mg/dL (ref 0–99)
TRIGLYCERIDES: 103 mg/dL (ref 0–149)
VLDL Cholesterol Cal: 21 mg/dL (ref 5–40)

## 2017-12-05 LAB — PSA: Prostate Specific Ag, Serum: 2.5 ng/mL (ref 0.0–4.0)

## 2018-02-16 DIAGNOSIS — H401111 Primary open-angle glaucoma, right eye, mild stage: Secondary | ICD-10-CM | POA: Diagnosis not present

## 2018-02-16 DIAGNOSIS — H2513 Age-related nuclear cataract, bilateral: Secondary | ICD-10-CM | POA: Diagnosis not present

## 2018-02-16 DIAGNOSIS — H401123 Primary open-angle glaucoma, left eye, severe stage: Secondary | ICD-10-CM | POA: Diagnosis not present

## 2018-02-19 ENCOUNTER — Other Ambulatory Visit: Payer: Self-pay | Admitting: Family Medicine

## 2018-02-19 DIAGNOSIS — R351 Nocturia: Secondary | ICD-10-CM

## 2018-02-19 DIAGNOSIS — R350 Frequency of micturition: Secondary | ICD-10-CM

## 2018-04-10 ENCOUNTER — Other Ambulatory Visit: Payer: Self-pay | Admitting: Family Medicine

## 2018-04-10 DIAGNOSIS — R351 Nocturia: Secondary | ICD-10-CM

## 2018-04-10 DIAGNOSIS — R350 Frequency of micturition: Secondary | ICD-10-CM

## 2018-04-12 NOTE — Telephone Encounter (Signed)
Requested Prescriptions  Refused Prescriptions Disp Refills  . doxazosin (CARDURA) 1 MG tablet [Pharmacy Med Name: DOXAZOSIN MESYLATE 1 MG TAB] 30 tablet 1    Sig: TAKE ONE TABLET BY MOUTH DAILY     Cardiovascular:  Alpha Blockers Passed - 04/10/2018 11:21 AM      Passed - Last BP in normal range    BP Readings from Last 1 Encounters:  12/04/17 132/84         Passed - Valid encounter within last 6 months    Recent Outpatient Visits          4 months ago Annual physical exam   [facility] [place], MD   10 months ago Hyperlipidemia, unspecified hyperlipidemia type   [facility] [place], [name], MD   11 months ago Rash and nonspecific skin eruption   [facility] [place], [name], MD   2 years ago Special screening for malignant neoplasms, colon   [facility] [place], [name], MD   4 years ago Annual physical exam   [facility], Benn Moulder, MD

## 2018-04-15 ENCOUNTER — Ambulatory Visit: Payer: Medicare HMO | Admitting: Family Medicine

## 2018-05-11 DIAGNOSIS — Z85828 Personal history of other malignant neoplasm of skin: Secondary | ICD-10-CM | POA: Diagnosis not present

## 2018-05-11 DIAGNOSIS — L821 Other seborrheic keratosis: Secondary | ICD-10-CM | POA: Diagnosis not present

## 2018-05-11 DIAGNOSIS — D1801 Hemangioma of skin and subcutaneous tissue: Secondary | ICD-10-CM | POA: Diagnosis not present

## 2018-05-11 DIAGNOSIS — L300 Nummular dermatitis: Secondary | ICD-10-CM | POA: Diagnosis not present

## 2018-05-11 DIAGNOSIS — L570 Actinic keratosis: Secondary | ICD-10-CM | POA: Diagnosis not present

## 2018-05-18 ENCOUNTER — Other Ambulatory Visit: Payer: Self-pay | Admitting: Family Medicine

## 2018-05-18 DIAGNOSIS — R351 Nocturia: Secondary | ICD-10-CM

## 2018-05-18 DIAGNOSIS — R350 Frequency of micturition: Secondary | ICD-10-CM

## 2018-05-20 DIAGNOSIS — R69 Illness, unspecified: Secondary | ICD-10-CM | POA: Diagnosis not present

## 2018-05-24 ENCOUNTER — Encounter: Payer: Self-pay | Admitting: Family Medicine

## 2018-05-28 ENCOUNTER — Ambulatory Visit: Payer: Medicare HMO | Admitting: Family Medicine

## 2018-08-14 ENCOUNTER — Other Ambulatory Visit: Payer: Self-pay | Admitting: Family Medicine

## 2018-08-14 DIAGNOSIS — R350 Frequency of micturition: Secondary | ICD-10-CM

## 2018-08-14 DIAGNOSIS — R351 Nocturia: Secondary | ICD-10-CM

## 2018-08-14 NOTE — Telephone Encounter (Signed)
Forwarding medication refill to PCP for review. 

## 2018-08-14 NOTE — Telephone Encounter (Signed)
Doxazosin refilled, but please schedule appointment.

## 2018-08-18 NOTE — Telephone Encounter (Signed)
Scheduled for September.

## 2018-09-09 DIAGNOSIS — H2513 Age-related nuclear cataract, bilateral: Secondary | ICD-10-CM | POA: Diagnosis not present

## 2018-09-09 DIAGNOSIS — H401123 Primary open-angle glaucoma, left eye, severe stage: Secondary | ICD-10-CM | POA: Diagnosis not present

## 2018-09-09 DIAGNOSIS — H401111 Primary open-angle glaucoma, right eye, mild stage: Secondary | ICD-10-CM | POA: Diagnosis not present

## 2018-09-13 DIAGNOSIS — R69 Illness, unspecified: Secondary | ICD-10-CM | POA: Diagnosis not present

## 2018-10-14 ENCOUNTER — Other Ambulatory Visit: Payer: Self-pay

## 2018-10-14 ENCOUNTER — Ambulatory Visit (INDEPENDENT_AMBULATORY_CARE_PROVIDER_SITE_OTHER): Payer: Medicare HMO | Admitting: Family Medicine

## 2018-10-14 ENCOUNTER — Encounter: Payer: Self-pay | Admitting: Family Medicine

## 2018-10-14 VITALS — BP 123/78 | HR 83 | Temp 98.5°F | Resp 14 | Wt 190.2 lb

## 2018-10-14 DIAGNOSIS — E785 Hyperlipidemia, unspecified: Secondary | ICD-10-CM | POA: Diagnosis not present

## 2018-10-14 DIAGNOSIS — H409 Unspecified glaucoma: Secondary | ICD-10-CM | POA: Diagnosis not present

## 2018-10-14 DIAGNOSIS — N529 Male erectile dysfunction, unspecified: Secondary | ICD-10-CM | POA: Diagnosis not present

## 2018-10-14 DIAGNOSIS — R350 Frequency of micturition: Secondary | ICD-10-CM

## 2018-10-14 DIAGNOSIS — B354 Tinea corporis: Secondary | ICD-10-CM | POA: Diagnosis not present

## 2018-10-14 DIAGNOSIS — R351 Nocturia: Secondary | ICD-10-CM

## 2018-10-14 DIAGNOSIS — R21 Rash and other nonspecific skin eruption: Secondary | ICD-10-CM | POA: Diagnosis not present

## 2018-10-14 DIAGNOSIS — R739 Hyperglycemia, unspecified: Secondary | ICD-10-CM | POA: Diagnosis not present

## 2018-10-14 MED ORDER — ATORVASTATIN CALCIUM 40 MG PO TABS: 20.0000 mg | ORAL_TABLET | Freq: Every day | ORAL | 0 refills | Status: DC

## 2018-10-14 MED ORDER — DOXAZOSIN MESYLATE 2 MG PO TABS: 1.0000 mg | ORAL_TABLET | Freq: Every day | ORAL | 1 refills | Status: DC

## 2018-10-14 NOTE — Patient Instructions (Addendum)
I would recommend discussing medication and further treatment of rash. Ok to try clotrimazole over the counter twice per day for now.   Try higher dose of doxazosin. If new side effects, let me know. recheck in next 3 months. Return to the clinic or go to the nearest emergency room if any of your symptoms worsen or new symptoms occur.  No other med changes for now.     If you have lab work done today you will be contacted with your lab results within the next 2 weeks.  If you have not heard from [place] then please contact us. The fastest way to get your results is to register for My Chart.   IF you received an x-ray today, you will receive an invoice from [facility]. Please contact [facility] at [phone] with questions or concerns regarding your invoice.   IF you received labwork today, you will receive an invoice from [place]. Please contact [facility] at [phone] with questions or concerns regarding your invoice.   Our billing staff will not be able to assist you with questions regarding bills from these companies.  You will be contacted with the lab results as soon as they are available. The fastest way to get your results is to activate your My Chart account. Instructions are located on the last page of this paperwork. If you have not heard from [place] regarding the results in 2 weeks, please contact this office.

## 2018-10-14 NOTE — Progress Notes (Signed)
Subjective:    Patient ID: Nathan Mora, male    DOB: 30-Apr-1945, 73 y.o.   MRN: [id number]  HPI . Nathan Mora is a 73 y.o. male Presents today for: Chief Complaint  Patient presents with  . Chronic Medical condition    need a refill on gabapentin  . Rash    rash on right inner lower leg- Would like to see what DR [name] thinks about it. Been going on for 1 yr. Have seen Derm but they do not seeems to know what this is   Hyperlipidemia:  Lab Results  Component Value Date   CHOL 174 12/04/2017   HDL 58 12/04/2017   LDLCALC 95 12/04/2017   TRIG 103 12/04/2017   CHOLHDL 3.0 12/04/2017   Lab Results  Component Value Date   ALT 36 12/04/2017   AST 24 12/04/2017   ALKPHOS 59 12/04/2017   BILITOT 0.7 12/04/2017  Lipitor 20 mg total dose per day, half of 40 mg tablet. No new side effects. No myalgias.   Erectile dysfunction: Sildenafil 20 mg 1-2 used at a time when discussed at November visit.  Still using 2-3 pills. Per time. No hearing or vision changes. No CP with exertion.   Rash: Right inner leg, evaluated by dermatology.  Dermatologist Dr. Martinique. Similar rash on upper leg for years, then resolved.  Current rash past 1.5 yrs, worse in summer.  Multiple treatments tried per derm. Thought to be fungal infection.   Nocturia: Discussed at November 2019 physical, possible BPH with LUTS.  Started on Cardura 1 mg with planned follow-up of 2 to 3 months.  Did report taking his wife's gabapentin in the past to help him sleep.  Previously reported difficulty with sleep with waking up to urinate, not with sleep onset.  No side effects with cardura 1mg . Still taking doxazosin nightly. Thinks may help some - less frequent nocturia.  Still taking wife's gabapentin sleep all night. If not taking  wakes up once to urinate or not at all.  Possibly decreased balance if going up stairs with multiple objects in hands.   Previous hyperglycemia, borderline A1c last year. Lab Results   Component Value Date   HGBA1C 5.6 12/04/2017   Wt Readings from Last 3 Encounters:  10/14/18 190 lb 3.2 oz (86.3 kg)  12/04/17 191 lb 12.8 oz (87 kg)  06/01/17 189 lb 9.6 oz (86 kg)     Patient Active Problem List   Diagnosis Date Noted  . Family history of early CAD 04/23/2012  . Hyperlipidemia   . Glaucoma   . Lipid disorder 03/29/2012   Past Medical History:  Diagnosis Date  . Glaucoma    left eye  . Hyperlipidemia   . Lipid disorder 03/29/2012   No past surgical history on file. No Known Allergies Prior to Admission medications   Medication Sig Start Date End Date Taking? Authorizing Provider  atorvastatin (LIPITOR) 40 MG tablet Take 0.5 tablets (20 mg total) by mouth daily at 6 PM. 12/04/17  Yes [name], MD  brimonidine-timolol (COMBIGAN) 0.2-0.5 % ophthalmic solution Place 1 drop into both eyes every 12 (twelve) hours.   Yes [provider]  doxazosin (CARDURA) 1 MG tablet TAKE ONE TABLET BY MOUTH DAILY 08/14/18  Yes [name], MD  gabapentin (NEURONTIN) 800 MG tablet Take 400 mg by mouth at bedtime. Not his persciption. Its his wife's and he takes half a tab to help him sleep going on 1 year   Yes [provider]  latanoprost (XALATAN) 0.005 % ophthalmic solution 1 drop as directed.   Yes [provider]  sildenafil (REVATIO) 20 MG tablet 1 to 3 tabs up to QD prior to onset of sexual activity. 12/04/17  Yes [name], MD   Social History   Socioeconomic History  . Marital status: Married    Spouse name: Not on file  . Number of children: Not on file  . Years of education: Not on file  . Highest education level: Not on file  Occupational History  . Not on file  Social Needs  . Financial resource strain: Not on file  . Food insecurity    Worry: Not on file    Inability: Not on file  . Transportation needs    Medical: Not on file    Non-medical: Not on file  Tobacco Use  . Smoking status: Never Smoker  .  Smokeless tobacco: Never Used  Substance and Sexual Activity  . Alcohol use: Yes    Alcohol/week: 4.0 standard drinks    Types: 4 Cans of beer per week  . Drug use: No  . Sexual activity: Yes  Lifestyle  . Physical activity    Days per week: Not on file    Minutes per session: Not on file  . Stress: Not on file  Relationships  . Social Herbalist on phone: Not on file    Gets together: Not on file    Attends religious service: Not on file    Active member of club or organization: Not on file    Attends meetings of clubs or organizations: Not on file    Relationship status: Not on file  . Intimate partner violence    Fear of current or ex partner: Not on file    Emotionally abused: Not on file    Physically abused: Not on file    Forced sexual activity: Not on file  Other Topics Concern  . Not on file  Social History Narrative  . Not on file    Review of Systems     Objective:   Physical Exam Vitals signs reviewed.  Constitutional:      Appearance: He is well-developed.  HENT:     Head: Normocephalic and atraumatic.  Eyes:     Pupils: Pupils are equal, round, and reactive to light.  Neck:     Vascular: No carotid bruit or JVD.  Cardiovascular:     Rate and Rhythm: Normal rate and regular rhythm.     Heart sounds: Normal heart sounds. No murmur.  Pulmonary:     Effort: Pulmonary effort is normal.     Breath sounds: Normal breath sounds. No rales.  Skin:    General: Skin is warm and dry.       Neurological:     Mental Status: He is alert and oriented to person, place, and time.    Vitals:   10/14/18 0923  BP: 123/78  Pulse: 83  Resp: 14  Temp: 98.5 F (36.9 C)  TempSrc: Oral  SpO2: 98%  Weight: 190 lb 3.2 oz (86.3 kg)       Assessment & Plan:   Quantrell Silber is a 73 y.o. male Tinea corporis Rash and nonspecific skin eruption  -Suspected tinea corporis on leg, has been treated with dermatology.  Unsure of what regimen has been used.   Over-the-counter clotrimazole okay for now until he has followed up with dermatology regarding next steps  Hyperglycemia -  Plan: Hemoglobin A1c  Hyperlipidemia, unspecified hyperlipidemia type - Plan: Comprehensive metabolic panel, Lipid panel atorvastatin (LIPITOR) 40 MG tablet  - Stable, tolerating current regimen. Medications refilled. Labs pending as above.   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  Tolerating 1 mg doxazosin, still some intermittent symptoms.  Increase to 2 mg with potential side effects discussed.  Discussed reasons for not using gabapentin at this time as likely urinary issue.  RTC precautions  Erectile dysfunction, unspecified erectile dysfunction type  -Tolerating sildenafil, no changes. lowest effective dose. Side effects discussed (including but not limited to headache/flushing, blue discoloration of vision, possible vascular steal and risk of cardiac effects if underlying unknown coronary artery disease, and permanent sensorineural hearing loss). Understanding expressed.    Meds ordered this encounter  Medications  . doxazosin (CARDURA) 2 MG tablet    Sig: Take 0.5 tablets (1 mg total) by mouth daily.    Dispense:  90 tablet    Refill:  1  . atorvastatin (LIPITOR) 40 MG tablet    Sig: Take 0.5 tablets (20 mg total) by mouth daily at 6 PM.    Dispense:  90 tablet    Refill:  0   Patient Instructions   I would recommend discussing medication and further treatment of rash. Ok to try clotrimazole over the counter twice per day for now.   Try higher dose of doxazosin. If new side effects, let me know. recheck in next 3 months. Return to the clinic or go to the nearest emergency room if any of your symptoms worsen or new symptoms occur.  No other med changes for now.     If you have lab work done today you will be contacted with your lab  results within the next 2 weeks.  If you have not heard from [place] then please contact us. The fastest way to get your results is to register for My Chart.   IF you received an x-ray today, you will receive an invoice from [facility]. Please contact [facility] at [phone] with questions or concerns regarding your invoice.   IF you received labwork today, you will receive an invoice from [facility]. Please contact [facility] at [phone] with questions or concerns regarding your invoice.   Our billing staff will not be able to assist you with questions regarding bills from these companies.  You will be contacted with the lab results as soon as they are available. The fastest way to get your results is to activate your My Chart account. Instructions are located on the last page of this paperwork. If you have not heard from [place] regarding the results in 2 weeks, please contact this office.       Signed,   [name], MD [facility].  10/15/18 10:28 AM

## 2018-10-15 ENCOUNTER — Encounter: Payer: Self-pay | Admitting: Family Medicine

## 2018-10-15 LAB — PSA: Prostate Specific Ag, Serum: 3.4 ng/mL (ref 0.0–4.0)

## 2018-10-15 LAB — COMPREHENSIVE METABOLIC PANEL
ALT: 45 IU/L — ABNORMAL HIGH (ref 0–44)
AST: 29 IU/L (ref 0–40)
Albumin/Globulin Ratio: 2.6 — ABNORMAL HIGH (ref 1.2–2.2)
Albumin: 4.5 g/dL (ref 3.7–4.7)
Alkaline Phosphatase: 64 IU/L (ref 39–117)
BUN/Creatinine Ratio: 21 (ref 10–24)
BUN: 16 mg/dL (ref 8–27)
Bilirubin Total: 0.9 mg/dL (ref 0.0–1.2)
CO2: 24 mmol/L (ref 20–29)
Calcium: 9.1 mg/dL (ref 8.6–10.2)
Chloride: 101 mmol/L (ref 96–106)
Creatinine, Ser: 0.78 mg/dL (ref 0.76–1.27)
GFR calc Af Amer: 104 mL/min/{1.73_m2} (ref >59)
GFR calc non Af Amer: 90 mL/min/{1.73_m2} (ref >59)
Globulin, Total: 1.7 g/dL (ref 1.5–4.5)
Glucose: 119 mg/dL — ABNORMAL HIGH (ref 65–99)
Potassium: 4.8 mmol/L (ref 3.5–5.2)
Sodium: 138 mmol/L (ref 134–144)
Total Protein: 6.2 g/dL (ref 6.0–8.5)

## 2018-10-15 LAB — LIPID PANEL
Chol/HDL Ratio: 3.4 ratio (ref 0.0–5.0)
Cholesterol, Total: 145 mg/dL (ref 100–199)
HDL: 43 mg/dL (ref >39)
LDL Chol Calc (NIH): 80 mg/dL (ref 0–99)
Triglycerides: 122 mg/dL (ref 0–149)
VLDL Cholesterol Cal: 22 mg/dL (ref 5–40)

## 2018-10-15 LAB — HEMOGLOBIN A1C
Est. average glucose Bld gHb Est-mCnc: 126 mg/dL
Hgb A1c MFr Bld: 6 % — ABNORMAL HIGH (ref 4.8–5.6)

## 2018-10-19 ENCOUNTER — Other Ambulatory Visit: Payer: Self-pay

## 2018-10-19 DIAGNOSIS — R350 Frequency of micturition: Secondary | ICD-10-CM

## 2018-10-19 DIAGNOSIS — H409 Unspecified glaucoma: Secondary | ICD-10-CM

## 2018-10-19 DIAGNOSIS — R351 Nocturia: Secondary | ICD-10-CM

## 2018-10-19 MED ORDER — DOXAZOSIN MESYLATE 2 MG PO TABS: 2.0000 mg | ORAL_TABLET | Freq: Every day | ORAL | 1 refills | Status: DC

## 2018-10-19 MED ORDER — ATORVASTATIN CALCIUM 40 MG PO TABS: 20.0000 mg | ORAL_TABLET | Freq: Every day | ORAL | 0 refills | Status: DC

## 2018-11-13 ENCOUNTER — Other Ambulatory Visit: Payer: Self-pay | Admitting: Family Medicine

## 2018-11-13 DIAGNOSIS — R351 Nocturia: Secondary | ICD-10-CM

## 2018-11-13 DIAGNOSIS — R350 Frequency of micturition: Secondary | ICD-10-CM

## 2018-11-18 ENCOUNTER — Other Ambulatory Visit: Payer: Self-pay

## 2018-11-18 DIAGNOSIS — Z20822 Contact with and (suspected) exposure to covid-19: Secondary | ICD-10-CM

## 2018-11-20 LAB — NOVEL CORONAVIRUS, NAA: SARS-CoV-2, NAA: NOT DETECTED

## 2018-12-01 DIAGNOSIS — D1801 Hemangioma of skin and subcutaneous tissue: Secondary | ICD-10-CM | POA: Diagnosis not present

## 2018-12-01 DIAGNOSIS — L821 Other seborrheic keratosis: Secondary | ICD-10-CM | POA: Diagnosis not present

## 2018-12-01 DIAGNOSIS — B354 Tinea corporis: Secondary | ICD-10-CM | POA: Diagnosis not present

## 2018-12-01 DIAGNOSIS — Z85828 Personal history of other malignant neoplasm of skin: Secondary | ICD-10-CM | POA: Diagnosis not present

## 2019-01-10 ENCOUNTER — Encounter: Payer: Self-pay | Admitting: Family Medicine

## 2019-01-10 ENCOUNTER — Ambulatory Visit (INDEPENDENT_AMBULATORY_CARE_PROVIDER_SITE_OTHER): Payer: Medicare HMO | Admitting: Family Medicine

## 2019-01-10 ENCOUNTER — Other Ambulatory Visit: Payer: Self-pay

## 2019-01-10 VITALS — BP 130/71 | HR 74 | Temp 98.0°F | Wt 193.2 lb

## 2019-01-10 DIAGNOSIS — Z0000 Encounter for general adult medical examination without abnormal findings: Secondary | ICD-10-CM

## 2019-01-10 NOTE — Progress Notes (Signed)
Subjective:  Patient ID: Nathan Mora, male    DOB: 08-Jul-1945  Age: 73 y.o. MRN: [id number]  CC:  Chief Complaint  Patient presents with  . wellness exam    HPI Nathan Mora presents for  Annual wellness visit.   Care team: XTG:GYIRSW, [name], MD Dermatology, Dr. Martinique Ophthalmology, Dr. Sharen Counter   Hyperlipidemia: Lipitor 40 mg daily. No new side effects.   Lab Results  Component Value Date   CHOL 145 10/14/2018   HDL 43 10/14/2018   LDLCALC 80 10/14/2018   TRIG 122 10/14/2018   CHOLHDL 3.4 10/14/2018   Lab Results  Component Value Date   ALT 45 (H) 10/14/2018   AST 29 10/14/2018   ALKPHOS 64 10/14/2018   BILITOT 0.9 10/14/2018   Glaucoma Takes Combigan, regular follow-up with ophthalmology  Nocturia with suspected BPH Improvement in symptoms in September on 1 mg Cardura. option of 2 mg of Cardura nightly.  Is also taking sildenafil for erectile dysfunction with discussion of medication in September.  23m cardura is doing ok. Last night made 10 nights no nocturia. Takes gabpentin most times as well - 1/2 pill about 5 nights per week. No lightheadedness.  A1c at prediabetes at that time.  Recheck level 6 months planned.  Cancer screening Colonoscopy 01/01/2016 PSA 3.4 in September.  2.01 December 2017. Plan on recheck in another 3 months.  History of melanoma, followed by dermatology, Dr. JMartiniqueannually. Rash from last visit resolved.    Immunization History  Administered Date(s) Administered  . Influenza,inj,Quad PF,6+ Mos 10/11/2013, 09/27/2015  . Influenza-Unspecified 11/06/2017  . Pneumococcal Conjugate-13 10/11/2013  . Pneumococcal Polysaccharide-23 12/12/2006, 09/27/2015  . Td 01/11/2008  . Zoster 02/11/2008  shingles: Had disease in April 2019, s/p shingrix x 2.    Fall Risk  01/10/2019 10/14/2018 10/14/2018 12/04/2017 06/01/2017  Falls in the past year? 0 0 0 0 No  Number falls in past yr: 0 0 0 - -  Injury with Fall? 0 0 - - -  Follow up Falls  evaluation completed Falls evaluation completed Falls evaluation completed - -   [facility] 01/10/2019 10/14/2018 10/14/2018 12/04/2017 06/01/2017  Decreased Interest 0 0 0 0 0  Down, Depressed, Hopeless 0 0 0 0 0  PHQ - 2 Score 0 0 0 0 0   Functional Status Survey: Is the patient deaf or have difficulty hearing?: No Does the patient have difficulty seeing, even when wearing glasses/contacts?: No Does the patient have difficulty concentrating, remembering, or making decisions?: No Does the patient have difficulty walking or climbing stairs?: No Does the patient have difficulty dressing or bathing?: No Does the patient have difficulty doing errands alone such as visiting a doctor's office or shopping?: No   Memory screen: 6CIT Screen 01/10/2019 12/04/2017 11/13/2016  What Year? 0 points 0 points 0 points  What month? 0 points 0 points 0 points  What time? 0 points 0 points 0 points  Count back from 20 0 points 2 points 0 points  Months in reverse 0 points 0 points 0 points  Repeat phrase 0 points 0 points 0 points  Total Score 0 2 0    Hearing Screening   '125Hz'  '250Hz'  '500Hz'  '1000Hz'  '2000Hz'  '3000Hz'  '4000Hz'  '6000Hz'  '8000Hz'   Right ear:           Left ear:             Visual Acuity Screening   Right eye Left eye Both eyes  Without correction:  With correction: '20/25 20/70 20/25 '     Office Visit from 01/10/2019 in [facility] at [facility]  AUDIT-C Score  8    2 drinks max. No DUI/problem drinking.   Dental: Every 6 months typically- Dr. Meyer Russel.   Exercise: Still working - walks few miles per day..  Advanced directives.  Has living will and healthcare power of attorney.  History Patient Active Problem List   Diagnosis Date Noted  . Family history of early CAD 04/23/2012  . Hyperlipidemia   . Glaucoma   . Lipid disorder 03/29/2012   Past Medical History:  Diagnosis Date  . Glaucoma    left eye  . Hyperlipidemia   . Lipid disorder 03/29/2012   No past  surgical history on file. No Known Allergies Prior to Admission medications   Medication Sig Start Date End Date Taking? Authorizing Provider  atorvastatin (LIPITOR) 40 MG tablet Take 0.5 tablets (20 mg total) by mouth daily at 6 PM. 10/19/18  Yes [name], MD  brimonidine-timolol (COMBIGAN) 0.2-0.5 % ophthalmic solution Place 1 drop into both eyes every 12 (twelve) hours.   Yes [provider]  doxazosin (CARDURA) 2 MG tablet Take 1 tablet (2 mg total) by mouth daily. 10/19/18  Yes [name], MD  latanoprost (XALATAN) 0.005 % ophthalmic solution 1 drop as directed.   Yes [provider]  sildenafil (REVATIO) 20 MG tablet 1 to 3 tabs up to QD prior to onset of sexual activity. 12/04/17  Yes [name], MD   Social History   Socioeconomic History  . Marital status: Married    Spouse name: Not on file  . Number of children: Not on file  . Years of education: Not on file  . Highest education level: Not on file  Occupational History  . Not on file  Tobacco Use  . Smoking status: Never Smoker  . Smokeless tobacco: Never Used  Substance and Sexual Activity  . Alcohol use: Yes    Alcohol/week: 4.0 standard drinks    Types: 4 Cans of beer per week  . Drug use: No  . Sexual activity: Yes  Other Topics Concern  . Not on file  Social History Narrative  . Not on file   Social Determinants of Health   Financial Resource Strain:   . Difficulty of Paying Living Expenses: Not on file  Food Insecurity:   . Worried About Charity fundraiser in the Last Year: Not on file  . Ran Out of Food in the Last Year: Not on file  Transportation Needs:   . Lack of Transportation (Medical): Not on file  . Lack of Transportation (Non-Medical): Not on file  Physical Activity:   . Days of Exercise per Week: Not on file  . Minutes of Exercise per Session: Not on file  Stress:   . Feeling of Stress : Not on file  Social Connections:   . Frequency of Communication  with Friends and Family: Not on file  . Frequency of Social Gatherings with Friends and Family: Not on file  . Attends Religious Services: Not on file  . Active Member of Clubs or Organizations: Not on file  . Attends Archivist Meetings: Not on file  . Marital Status: Not on file  Intimate Partner Violence:   . Fear of Current or Ex-Partner: Not on file  . Emotionally Abused: Not on file  . Physically Abused: Not on file  . Sexually Abused: Not on file  Review of Systems   Objective:   Vitals:   01/10/19 0921  BP: 130/71  Pulse: 74  Temp: 98 F (36.7 C)  TempSrc: Temporal  SpO2: 97%  Weight: 193 lb 3.2 oz (87.6 kg)     Physical Exam Constitutional:      General: He is not in acute distress.    Appearance: He is well-developed.  HENT:     Head: Normocephalic and atraumatic.  Cardiovascular:     Rate and Rhythm: Normal rate.  Pulmonary:     Effort: Pulmonary effort is normal.  Neurological:     Mental Status: He is alert and oriented to person, place, and time.        Assessment & Plan:  Nathan Mora is a 73 y.o. male . Medicare annual wellness visit, subsequent - anticipatory guidance as below in AVS, screening labs if needed. Health maintenance items as above in HPI discussed/recommended as applicable.  - no concerning responses on depression, fall, or functional status screening. Any positive responses noted as above. Advanced directives discussed as in CHL.  -3 mont follow up for repeat PSA, A1C and other med review.    No orders of the defined types were placed in this encounter.  Patient Instructions       If you have lab work done today you will be contacted with your lab results within the next 2 weeks.  If you have not heard from [place] then please contact us. The fastest way to get your results is to register for My Chart.   IF you received an x-ray today, you will receive an invoice from [facility]. Please contact  [facility] at [phone] with questions or concerns regarding your invoice.   IF you received labwork today, you will receive an invoice from [place]. Please contact [facility] at [phone] with questions or concerns regarding your invoice.   Our billing staff will not be able to assist you with questions regarding bills from these companies.  You will be contacted with the lab results as soon as they are available. The fastest way to get your results is to activate your My Chart account. Instructions are located on the last page of this paperwork. If you have not heard from [place] regarding the results in 2 weeks, please contact this office.     Preventive Care 49 Years and Older, Male Preventive care refers to lifestyle choices and visits with your health care provider that can promote health and wellness. This includes:  A yearly physical exam. This is also called an annual well check.  Regular dental and eye exams.  Immunizations.  Screening for certain conditions.  Healthy lifestyle choices, such as diet and exercise. What can I expect for my preventive care visit? Physical exam Your health care provider will check:  Height and weight. These may be used to calculate body mass index (BMI), which is a measurement that tells if you are at a healthy weight.  Heart rate and blood pressure.  Your skin for abnormal spots. Counseling Your health care provider may ask you questions about:  Alcohol, tobacco, and drug use.  Emotional well-being.  Home and relationship well-being.  Sexual activity.  Eating habits.  History of falls.  Memory and ability to understand (cognition).  Work and work Statistician. What immunizations do I need?  Influenza (flu) vaccine  This is recommended every year. Tetanus, diphtheria, and pertussis (Tdap) vaccine  You may need a Td booster every 10 years. Varicella (chickenpox) vaccine  You may need this vaccine if you have  not already been vaccinated. Zoster (shingles) vaccine  You may need this after age 13. Pneumococcal conjugate (PCV13) vaccine  One dose is recommended after age 38. Pneumococcal polysaccharide (PPSV23) vaccine  One dose is recommended after age 36. Measles, mumps, and rubella (MMR) vaccine  You may need at least one dose of MMR if you were born in 1957 or later. You may also need a second dose. Meningococcal conjugate (MenACWY) vaccine  You may need this if you have certain conditions. Hepatitis A vaccine  You may need this if you have certain conditions or if you travel or work in places where you may be exposed to hepatitis A. Hepatitis B vaccine  You may need this if you have certain conditions or if you travel or work in places where you may be exposed to hepatitis B. Haemophilus influenzae type b (Hib) vaccine  You may need this if you have certain conditions. You may receive vaccines as individual doses or as more than one vaccine together in one shot (combination vaccines). Talk with your health care provider about the risks and benefits of combination vaccines. What tests do I need? Blood tests  Lipid and cholesterol levels. These may be checked every 5 years, or more frequently depending on your overall health.  Hepatitis C test.  Hepatitis B test. Screening  Lung cancer screening. You may have this screening every year starting at age 29 if you have a 30-pack-year history of smoking and currently smoke or have quit within the past 15 years.  Colorectal cancer screening. All adults should have this screening starting at age 26 and continuing until age 53. Your health care provider may recommend screening at age 22 if you are at increased risk. You will have tests every 1-10 years, depending on your results and the type of screening test.  Prostate cancer screening. Recommendations will vary depending on your family history and other risks.  Diabetes screening. This  is done by checking your blood sugar (glucose) after you have not eaten for a while (fasting). You may have this done every 1-3 years.  Abdominal aortic aneurysm (AAA) screening. You may need this if you are a current or former smoker.  Sexually transmitted disease (STD) testing. Follow these instructions at home: Eating and drinking  Eat a diet that includes fresh fruits and vegetables, whole grains, lean protein, and low-fat dairy products. Limit your intake of foods with high amounts of sugar, saturated fats, and salt.  Take vitamin and mineral supplements as recommended by your health care provider.  Do not drink alcohol if your health care provider tells you not to drink.  If you drink alcohol: ? Limit how much you have to 0-2 drinks a day. ? Be aware of how much alcohol is in your drink. In the [place] one drink equals one 12 oz bottle of beer (355 mL), one 5 oz glass of wine (148 mL), or one 1 oz glass of hard liquor (44 mL). Lifestyle  Take daily care of your teeth and gums.  Stay active. Exercise for at least 30 minutes on 5 or more days each week.  Do not use any products that contain nicotine or tobacco, such as cigarettes, e-cigarettes, and chewing tobacco. If you need help quitting, ask your health care provider.  If you are sexually active, practice safe sex. Use a condom or other form of protection to prevent STIs (sexually transmitted infections).  Talk with your health  care provider about taking a low-dose aspirin or statin. What's next?  Visit your health care provider once a year for a well check visit.  Ask your health care provider how often you should have your eyes and teeth checked.  Stay up to date on all vaccines. This information is not intended to replace advice given to you by your health care provider. Make sure you discuss any questions you have with your health care provider. Document Released: 02/09/2015 Document Revised: 01/07/2018 Document  Reviewed: 01/07/2018 Elsevier Patient Education  2020 [employer].      Signed, [name], MD [facility]

## 2019-01-10 NOTE — Patient Instructions (Addendum)
   If you have lab work done today you will be contacted with your lab results within the next 2 weeks.  If you have not heard from us then please contact us. The fastest way to get your results is to register for My Chart.   IF you received an x-ray today, you will receive an invoice from [facility]. Please contact [facility] at [phone] with questions or concerns regarding your invoice.   IF you received labwork today, you will receive an invoice from [facility]. Please contact [facility] at [phone] with questions or concerns regarding your invoice.   Our billing staff will not be able to assist you with questions regarding bills from these companies.  You will be contacted with the lab results as soon as they are available. The fastest way to get your results is to activate your My Chart account. Instructions are located on the last page of this paperwork. If you have not heard from us regarding the results in 2 weeks, please contact this office.     Preventive Care 72 Years and Older, Male Preventive care refers to lifestyle choices and visits with your health care provider that can promote health and wellness. This includes:  A yearly physical exam. This is also called an annual well check.  Regular dental and eye exams.  Immunizations.  Screening for certain conditions.  Healthy lifestyle choices, such as diet and exercise. What can I expect for my preventive care visit? Physical exam Your health care provider will check:  Height and weight. These may be used to calculate body mass index (BMI), which is a measurement that tells if you are at a healthy weight.  Heart rate and blood pressure.  Your skin for abnormal spots. Counseling Your health care provider may ask you questions about:  Alcohol, tobacco, and drug use.  Emotional well-being.  Home and relationship well-being.  Sexual activity.  Eating habits.  History of  falls.  Memory and ability to understand (cognition).  Work and work environment. What immunizations do I need?  Influenza (flu) vaccine  This is recommended every year. Tetanus, diphtheria, and pertussis (Tdap) vaccine  You may need a Td booster every 10 years. Varicella (chickenpox) vaccine  You may need this vaccine if you have not already been vaccinated. Zoster (shingles) vaccine  You may need this after age 60. Pneumococcal conjugate (PCV13) vaccine  One dose is recommended after age 65. Pneumococcal polysaccharide (PPSV23) vaccine  One dose is recommended after age 65. Measles, mumps, and rubella (MMR) vaccine  You may need at least one dose of MMR if you were born in 1957 or later. You may also need a second dose. Meningococcal conjugate (MenACWY) vaccine  You may need this if you have certain conditions. Hepatitis A vaccine  You may need this if you have certain conditions or if you travel or work in places where you may be exposed to hepatitis A. Hepatitis B vaccine  You may need this if you have certain conditions or if you travel or work in places where you may be exposed to hepatitis B. Haemophilus influenzae type b (Hib) vaccine  You may need this if you have certain conditions. You may receive vaccines as individual doses or as more than one vaccine together in one shot (combination vaccines). Talk with your health care provider about the risks and benefits of combination vaccines. What tests do I need? Blood tests  Lipid and cholesterol levels. These may be checked every 5   years, or more frequently depending on your overall health.  Hepatitis C test.  Hepatitis B test. Screening  Lung cancer screening. You may have this screening every year starting at age 55 if you have a 30-pack-year history of smoking and currently smoke or have quit within the past 15 years.  Colorectal cancer screening. All adults should have this screening starting at age 50  and continuing until age 75. Your health care provider may recommend screening at age 45 if you are at increased risk. You will have tests every 1-10 years, depending on your results and the type of screening test.  Prostate cancer screening. Recommendations will vary depending on your family history and other risks.  Diabetes screening. This is done by checking your blood sugar (glucose) after you have not eaten for a while (fasting). You may have this done every 1-3 years.  Abdominal aortic aneurysm (AAA) screening. You may need this if you are a current or former smoker.  Sexually transmitted disease (STD) testing. Follow these instructions at home: Eating and drinking  Eat a diet that includes fresh fruits and vegetables, whole grains, lean protein, and low-fat dairy products. Limit your intake of foods with high amounts of sugar, saturated fats, and salt.  Take vitamin and mineral supplements as recommended by your health care provider.  Do not drink alcohol if your health care provider tells you not to drink.  If you drink alcohol: ? Limit how much you have to 0-2 drinks a day. ? Be aware of how much alcohol is in your drink. In the [place] one drink equals one 12 oz bottle of beer (355 mL), one 5 oz glass of wine (148 mL), or one 1 oz glass of hard liquor (44 mL). Lifestyle  Take daily care of your teeth and gums.  Stay active. Exercise for at least 30 minutes on 5 or more days each week.  Do not use any products that contain nicotine or tobacco, such as cigarettes, e-cigarettes, and chewing tobacco. If you need help quitting, ask your health care provider.  If you are sexually active, practice safe sex. Use a condom or other form of protection to prevent STIs (sexually transmitted infections).  Talk with your health care provider about taking a low-dose aspirin or statin. What's next?  Visit your health care provider once a year for a well check visit.  Ask your health care  provider how often you should have your eyes and teeth checked.  Stay up to date on all vaccines. This information is not intended to replace advice given to you by your health care provider. Make sure you discuss any questions you have with your health care provider. Document Released: 02/09/2015 Document Revised: 01/07/2018 Document Reviewed: 01/07/2018 Elsevier Patient Education  2020 [employer]vier Inc.  

## 2019-03-31 DIAGNOSIS — K529 Noninfective gastroenteritis and colitis, unspecified: Secondary | ICD-10-CM | POA: Insufficient documentation

## 2019-04-08 ENCOUNTER — Encounter: Payer: Self-pay | Admitting: Family Medicine

## 2019-04-08 ENCOUNTER — Other Ambulatory Visit: Payer: Self-pay

## 2019-04-08 ENCOUNTER — Ambulatory Visit (INDEPENDENT_AMBULATORY_CARE_PROVIDER_SITE_OTHER): Payer: Medicare HMO | Admitting: Family Medicine

## 2019-04-08 VITALS — BP 120/70 | HR 53 | Temp 97.6°F | Ht 68.0 in | Wt 186.8 lb

## 2019-04-08 DIAGNOSIS — N529 Male erectile dysfunction, unspecified: Secondary | ICD-10-CM

## 2019-04-08 DIAGNOSIS — E785 Hyperlipidemia, unspecified: Secondary | ICD-10-CM

## 2019-04-08 DIAGNOSIS — R7303 Prediabetes: Secondary | ICD-10-CM | POA: Diagnosis not present

## 2019-04-08 DIAGNOSIS — R350 Frequency of micturition: Secondary | ICD-10-CM

## 2019-04-08 DIAGNOSIS — R739 Hyperglycemia, unspecified: Secondary | ICD-10-CM | POA: Diagnosis not present

## 2019-04-08 DIAGNOSIS — R351 Nocturia: Secondary | ICD-10-CM

## 2019-04-08 MED ORDER — SILDENAFIL CITRATE 20 MG PO TABS: ORAL_TABLET | ORAL | 3 refills | Status: DC

## 2019-04-08 MED ORDER — ATORVASTATIN CALCIUM 40 MG PO TABS: 20.0000 mg | ORAL_TABLET | Freq: Every day | ORAL | 2 refills | Status: DC

## 2019-04-08 MED ORDER — DOXAZOSIN MESYLATE 2 MG PO TABS: 2.0000 mg | ORAL_TABLET | Freq: Every day | ORAL | 2 refills | Status: DC

## 2019-04-08 NOTE — Progress Notes (Signed)
Subjective:  Patient ID: Nathan Mora, male    DOB: May 02, 1945  Age: 74 y.o. MRN: [id number]  CC:  Chief Complaint  Patient presents with  . Medical Management of Chronic Issues    HPI Nathan Mora presents for   Hyperlipidemia: Lipitor 40 mg daily. No new myalgias/side effects.  Coffee with sugar this am only.   Lab Results  Component Value Date   CHOL 128 04/08/2019   HDL 43 04/08/2019   LDLCALC 68 04/08/2019   TRIG 88 04/08/2019   CHOLHDL 3.0 04/08/2019   Lab Results  Component Value Date   ALT 54 (H) 04/08/2019   AST 30 04/08/2019   ALKPHOS 83 04/08/2019   BILITOT 0.7 04/08/2019   Nocturia, BPH. Takes Cardura 2 mg daily, sildenafil for erectile dysfunction. Helps nocturia. Can go some nights without nocturia, never more than once. Not having daytime urinary issues.   Has used sildenafil on occasion, 40mg   Denies headache/flushing, blue discoloration of vision, hearing changes, or any CP with exertion. Working well - satisfied with results.  Prediabetes: Has lost some weight since December. 7 pounds.  Some exercise, adjusted diet - portion control. Walking 7 days per week. Up to 4 miles.  Had some diarrhea with overuse of stool softener - resolved with stopping med.   Lab Results  Component Value Date   HGBA1C 6.9 (H) 04/08/2019    Wt Readings from Last 3 Encounters:  04/08/19 186 lb 12.8 oz (84.7 kg)  01/10/19 193 lb 3.2 oz (87.6 kg)  10/14/18 190 lb 3.2 oz (86.3 kg)     History Patient Active Problem List   Diagnosis Date Noted  . Family history of early CAD 04/23/2012  . Hyperlipidemia   . Glaucoma   . Lipid disorder 03/29/2012   Past Medical History:  Diagnosis Date  . Glaucoma    left eye  . Hyperlipidemia   . Lipid disorder 03/29/2012   No past surgical history on file. No Known Allergies Prior to Admission medications   Medication Sig Start Date End Date Taking? Authorizing Provider  atorvastatin (LIPITOR) 40 MG tablet Take 0.5 tablets  (20 mg total) by mouth daily at 6 PM. 10/19/18  Yes [name], MD  brimonidine-timolol (COMBIGAN) 0.2-0.5 % ophthalmic solution Place 1 drop into both eyes every 12 (twelve) hours.   Yes [provider]  doxazosin (CARDURA) 2 MG tablet Take 1 tablet (2 mg total) by mouth daily. 10/19/18  Yes [name], MD  latanoprost (XALATAN) 0.005 % ophthalmic solution 1 drop as directed.   Yes [provider]  sildenafil (REVATIO) 20 MG tablet 1 to 3 tabs up to QD prior to onset of sexual activity. 12/04/17  Yes [name], MD   Social History   Socioeconomic History  . Marital status: Married    Spouse name: Not on file  . Number of children: Not on file  . Years of education: Not on file  . Highest education level: Not on file  Occupational History  . Not on file  Tobacco Use  . Smoking status: Never Smoker  . Smokeless tobacco: Never Used  Substance and Sexual Activity  . Alcohol use: Yes    Alcohol/week: 4.0 standard drinks    Types: 4 Cans of beer per week  . Drug use: No  . Sexual activity: Yes  Other Topics Concern  . Not on file  Social History Narrative  . Not on file   Social Determinants of Health  Financial Resource Strain:   . Difficulty of Paying Living Expenses:   Food Insecurity:   . Worried About Charity fundraiser in the Last Year:   . Arboriculturist in the Last Year:   Transportation Needs:   . Film/video editor (Medical):   Marland Kitchen Lack of Transportation (Non-Medical):   Physical Activity:   . Days of Exercise per Week:   . Minutes of Exercise per Session:   Stress:   . Feeling of Stress :   Social Connections:   . Frequency of Communication with Friends and Family:   . Frequency of Social Gatherings with Friends and Family:   . Attends Religious Services:   . Active Member of Clubs or Organizations:   . Attends Archivist Meetings:   Marland Kitchen Marital Status:   Intimate Partner Violence:   . Fear of Current or  Ex-Partner:   . Emotionally Abused:   Marland Kitchen Physically Abused:   . Sexually Abused:     Review of Systems  Constitutional: Negative for fatigue and unexpected weight change.  Eyes: Negative for visual disturbance.  Respiratory: Negative for cough, chest tightness and shortness of breath.   Cardiovascular: Negative for chest pain, palpitations and leg swelling.  Gastrointestinal: Negative for abdominal pain and blood in stool.  Neurological: Negative for dizziness, light-headedness and headaches.     Objective:   Vitals:   04/08/19 0949 04/08/19 0956  BP: (!) 143/73 120/70  Pulse: (!) 53   Temp: 97.6 F (36.4 C)   TempSrc: Temporal   SpO2: 97%   Weight: 186 lb 12.8 oz (84.7 kg)   Height: 5\' 8"  (1.727 m)      Physical Exam Vitals reviewed.  Constitutional:      Appearance: He is well-developed.  HENT:     Head: Normocephalic and atraumatic.  Eyes:     Pupils: Pupils are equal, round, and reactive to light.  Neck:     Vascular: No carotid bruit or JVD.  Cardiovascular:     Rate and Rhythm: Normal rate and regular rhythm.     Heart sounds: Normal heart sounds. No murmur.  Pulmonary:     Effort: Pulmonary effort is normal.     Breath sounds: Normal breath sounds. No rales.  Skin:    General: Skin is warm and dry.  Neurological:     Mental Status: He is alert and oriented to person, place, and time.     Assessment & Plan:  Nathan Mora is a 74 y.o. male . Hyperlipidemia, unspecified hyperlipidemia type - Plan: atorvastatin (LIPITOR) 40 MG tablet, Comprehensive metabolic panel, Lipid panel  -  Stable, tolerating current regimen. Medications refilled. Labs pending as above.   Urinary frequency - Plan: doxazosin (CARDURA) 2 MG tablet Nocturia - Plan: doxazosin (CARDURA) 2 MG tablet  -Improved/controlled with Cardura  Erectile dysfunction, unspecified erectile dysfunction type - Plan: sildenafil (REVATIO) 20 MG tablet  - stable with intermittent use -sildenafil rx  given  - use lowest effective dose. Side effects discussed (including but not limited to headache/flushing, blue discoloration of vision, possible vascular steal and risk of cardiac effects if underlying unknown coronary artery disease, and permanent sensorineural hearing loss). Understanding expressed.  Prediabetes - Plan: Hemoglobin A1c Hyperglycemia - Plan: Hemoglobin A1c  -Commended on weight loss.  Recheck A1c   diarrhea likely due to stool softeners.  Improved with decrease use.  RTC precautions.   Meds ordered this encounter  Medications  . atorvastatin (LIPITOR) 40 MG tablet  Sig: Take 0.5 tablets (20 mg total) by mouth daily at 6 PM.    Dispense:  45 tablet    Refill:  2  . doxazosin (CARDURA) 2 MG tablet    Sig: Take 1 tablet (2 mg total) by mouth daily.    Dispense:  90 tablet    Refill:  2  . sildenafil (REVATIO) 20 MG tablet    Sig: 1 to 3 tabs up to QD prior to onset of sexual activity.    Dispense:  30 tablet    Refill:  3   Patient Instructions    No change in  medications at this time.  If diarrhea returns, especially off the stool softeners, please follow up. therwise recheck in 45months.   Thanks for coming in today and stay safe.   If you have lab work done today you will be contacted with your lab results within the next 2 weeks.  If you have not heard from [place] then please contact us. The fastest way to get your results is to register for My Chart.   IF you received an x-ray today, you will receive an invoice from [facility]. Please contact [facility] at [phone] with questions or concerns regarding your invoice.   IF you received labwork today, you will receive an invoice from [facility]. Please contact [facility] at [phone] with questions or concerns regarding your invoice.   Our billing staff will not be able to assist you with questions regarding bills from these companies.  You will be contacted with the lab results as soon as  they are available. The fastest way to get your results is to activate your My Chart account. Instructions are located on the last page of this paperwork. If you have not heard from [place] regarding the results in 2 weeks, please contact this office.          Signed, [name], MD [facility]

## 2019-04-08 NOTE — Patient Instructions (Addendum)
  No change in  medications at this time.  If diarrhea returns, especially off the stool softeners, please follow up. therwise recheck in 84months.   Thanks for coming in today and stay safe.   If you have lab work done today you will be contacted with your lab results within the next 2 weeks.  If you have not heard from [place] then please contact us. The fastest way to get your results is to register for My Chart.   IF you received an x-ray today, you will receive an invoice from [facility]. Please contact [facility] at [phone] with questions or concerns regarding your invoice.   IF you received labwork today, you will receive an invoice from [place]. Please contact [facility] at [phone] with questions or concerns regarding your invoice.   Our billing staff will not be able to assist you with questions regarding bills from these companies.  You will be contacted with the lab results as soon as they are available. The fastest way to get your results is to activate your My Chart account. Instructions are located on the last page of this paperwork. If you have not heard from [place] regarding the results in 2 weeks, please contact this office.

## 2019-04-09 ENCOUNTER — Encounter: Payer: Self-pay | Admitting: Family Medicine

## 2019-04-09 LAB — COMPREHENSIVE METABOLIC PANEL
ALT: 54 IU/L — ABNORMAL HIGH (ref 0–44)
AST: 30 IU/L (ref 0–40)
Albumin/Globulin Ratio: 2.6 — ABNORMAL HIGH (ref 1.2–2.2)
Albumin: 4.7 g/dL (ref 3.7–4.7)
Alkaline Phosphatase: 83 IU/L (ref 39–117)
BUN/Creatinine Ratio: 14 (ref 10–24)
BUN: 11 mg/dL (ref 8–27)
Bilirubin Total: 0.7 mg/dL (ref 0.0–1.2)
CO2: 23 mmol/L (ref 20–29)
Calcium: 9.5 mg/dL (ref 8.6–10.2)
Chloride: 101 mmol/L (ref 96–106)
Creatinine, Ser: 0.79 mg/dL (ref 0.76–1.27)
GFR calc Af Amer: 104 mL/min/{1.73_m2} (ref >59)
GFR calc non Af Amer: 90 mL/min/{1.73_m2} (ref >59)
Globulin, Total: 1.8 g/dL (ref 1.5–4.5)
Glucose: 136 mg/dL — ABNORMAL HIGH (ref 65–99)
Potassium: 4.8 mmol/L (ref 3.5–5.2)
Sodium: 139 mmol/L (ref 134–144)
Total Protein: 6.5 g/dL (ref 6.0–8.5)

## 2019-04-09 LAB — LIPID PANEL
Chol/HDL Ratio: 3 ratio (ref 0.0–5.0)
Cholesterol, Total: 128 mg/dL (ref 100–199)
HDL: 43 mg/dL (ref >39)
LDL Chol Calc (NIH): 68 mg/dL (ref 0–99)
Triglycerides: 88 mg/dL (ref 0–149)
VLDL Cholesterol Cal: 17 mg/dL (ref 5–40)

## 2019-04-09 LAB — HEMOGLOBIN A1C
Est. average glucose Bld gHb Est-mCnc: 151 mg/dL
Hgb A1c MFr Bld: 6.9 % — ABNORMAL HIGH (ref 4.8–5.6)

## 2019-04-11 ENCOUNTER — Encounter: Payer: Self-pay | Admitting: Family Medicine

## 2019-04-11 NOTE — Telephone Encounter (Signed)
Pt would like to know your thoughts on his recent blood work.

## 2019-04-12 NOTE — Telephone Encounter (Signed)
Notes sent to patient from lab work.

## 2019-04-15 ENCOUNTER — Encounter: Payer: Self-pay | Admitting: Family Medicine

## 2019-06-16 DIAGNOSIS — H524 Presbyopia: Secondary | ICD-10-CM | POA: Diagnosis not present

## 2019-06-16 DIAGNOSIS — H52223 Regular astigmatism, bilateral: Secondary | ICD-10-CM | POA: Diagnosis not present

## 2019-06-16 DIAGNOSIS — H5213 Myopia, bilateral: Secondary | ICD-10-CM | POA: Diagnosis not present

## 2019-09-02 ENCOUNTER — Other Ambulatory Visit: Payer: Self-pay

## 2019-09-02 ENCOUNTER — Ambulatory Visit (INDEPENDENT_AMBULATORY_CARE_PROVIDER_SITE_OTHER): Payer: Medicare HMO | Admitting: Family Medicine

## 2019-09-02 ENCOUNTER — Encounter: Payer: Self-pay | Admitting: Family Medicine

## 2019-09-02 VITALS — BP 118/73 | HR 65 | Temp 98.1°F | Ht 68.0 in | Wt 180.0 lb

## 2019-09-02 DIAGNOSIS — R197 Diarrhea, unspecified: Secondary | ICD-10-CM | POA: Diagnosis not present

## 2019-09-02 DIAGNOSIS — R5383 Other fatigue: Secondary | ICD-10-CM | POA: Diagnosis not present

## 2019-09-02 DIAGNOSIS — R634 Abnormal weight loss: Secondary | ICD-10-CM | POA: Diagnosis not present

## 2019-09-02 DIAGNOSIS — R42 Dizziness and giddiness: Secondary | ICD-10-CM

## 2019-09-02 DIAGNOSIS — R739 Hyperglycemia, unspecified: Secondary | ICD-10-CM | POA: Diagnosis not present

## 2019-09-02 LAB — POCT CBC
Granulocyte percent: 38.5 %G (ref 37–80)
HCT, POC: 43.3 % — AB (ref 29–41)
Hemoglobin: 14.4 g/dL (ref 11–14.6)
Lymph, poc: 3.3 (ref 0.6–3.4)
MCH, POC: 30.7 pg (ref 27–31.2)
MCHC: 33.3 g/dL (ref 31.8–35.4)
MCV: 92.2 fL (ref 76–111)
MID (cbc): 0.4 (ref 0–0.9)
MPV: 9.1 fL (ref 0–99.8)
POC Granulocyte: 2.3 (ref 2–6.9)
POC LYMPH PERCENT: 54.6 %L — AB (ref 10–50)
POC MID %: 6.9 %M (ref 0–12)
Platelet Count, POC: 111 10*3/uL — AB (ref 142–424)
RBC: 4.7 M/uL (ref 4.69–6.13)
RDW, POC: 13.1 %
WBC: 6.1 10*3/uL (ref 4.6–10.2)

## 2019-09-02 LAB — GLUCOSE, POCT (MANUAL RESULT ENTRY): POC Glucose: 163 mg/dl — AB (ref 70–99)

## 2019-09-02 NOTE — Patient Instructions (Addendum)
I will check some labs including a 65-month blood sugar test as blood sugar was elevated in the office.  If that number has increased we may need to consider medication.  That can also contribute to weight loss. Avoid diet drinks for now.  Water is best, and make sure to drink 64 ounces per day.  If dizziness more often/worse - return for recheck.  I will refer you to gastroenterology to discuss diarrhea further.  See information below. Return to the clinic or go to the nearest emergency room if any of your symptoms worsen or new symptoms occur.  Dizziness Dizziness is a common problem. It is a feeling of unsteadiness or light-headedness. You may feel like you are about to faint. Dizziness can lead to injury if you stumble or fall. Anyone can become dizzy, but dizziness is more common in older adults. This condition can be caused by a number of things, including medicines, dehydration, or illness. Follow these instructions at home: Eating and drinking  Drink enough fluid to keep your urine clear or pale yellow. This helps to keep you from becoming dehydrated. Try to drink more clear fluids, such as water.  Do not drink alcohol.  Limit your caffeine intake if told to do so by your health care provider. Check ingredients and nutrition facts to see if a food or beverage contains caffeine.  Limit your salt (sodium) intake if told to do so by your health care provider. Check ingredients and nutrition facts to see if a food or beverage contains sodium. Activity  Avoid making quick movements. ? Rise slowly from chairs and steady yourself until you feel okay. ? In the morning, first sit up on the side of the bed. When you feel okay, stand slowly while you hold onto something until you know that your balance is fine.  If you need to stand in one place for a long time, move your legs often. Tighten and relax the muscles in your legs while you are standing.  Do not drive or use heavy machinery if  you feel dizzy.  Avoid bending down if you feel dizzy. Place items in your home so that they are easy for you to reach without leaning over. Lifestyle  Do not use any products that contain nicotine or tobacco, such as cigarettes and e-cigarettes. If you need help quitting, ask your health care provider.  Try to reduce your stress level by using methods such as yoga or meditation. Talk with your health care provider if you need help to manage your stress. General instructions  Watch your dizziness for any changes.  Take over-the-counter and prescription medicines only as told by your health care provider. Talk with your health care provider if you think that your dizziness is caused by a medicine that you are taking.  Tell a friend or a family member that you are feeling dizzy. If he or she notices any changes in your behavior, have this person call your health care provider.  Keep all follow-up visits as told by your health care provider. This is important. Contact a health care provider if:  Your dizziness does not go away.  Your dizziness or light-headedness gets worse.  You feel nauseous.  You have reduced hearing.  You have new symptoms.  You are unsteady on your feet or you feel like the room is spinning. Get help right away if:  You vomit or have diarrhea and are unable to eat or drink anything.  You have problems  talking, walking, swallowing, or using your arms, hands, or legs.  You feel generally weak.  You are not thinking clearly or you have trouble forming sentences. It may take a friend or family member to notice this.  You have chest pain, abdominal pain, shortness of breath, or sweating.  Your vision changes.  You have any bleeding.  You have a severe headache.  You have neck pain or a stiff neck.  You have a fever. These symptoms may represent a serious problem that is an emergency. Do not wait to see if the symptoms will go away. Get medical help  right away. Call your local emergency services (911 in the [place] Do not drive yourself to the hospital. Summary  Dizziness is a feeling of unsteadiness or light-headedness. This condition can be caused by a number of things, including medicines, dehydration, or illness.  Anyone can become dizzy, but dizziness is more common in older adults.  Drink enough fluid to keep your urine clear or pale yellow. Do not drink alcohol.  Avoid making quick movements if you feel dizzy. Monitor your dizziness for any changes. This information is not intended to replace advice given to you by your health care provider. Make sure you discuss any questions you have with your health care provider. Document Revised: 01/16/2017 Document Reviewed: 02/16/2016 Elsevier Patient Education  [place].  Diarrhea, Adult Diarrhea is frequent loose and watery bowel movements. Diarrhea can make you feel weak and cause you to become dehydrated. Dehydration can make you tired and thirsty, cause you to have a dry mouth, and decrease how often you urinate. Diarrhea typically lasts 2-3 days. However, it can last longer if it is a sign of something more serious. It is important to treat your diarrhea as told by your health care provider. Follow these instructions at home: Eating and drinking     Follow these recommendations as told by your health care provider:  Take an oral rehydration solution (ORS). This is an over-the-counter medicine that helps return your body to its normal balance of nutrients and water. It is found at pharmacies and retail stores.  Drink plenty of fluids, such as water, ice chips, diluted fruit juice, and low-calorie sports drinks. You can drink milk also, if desired.  Avoid drinking fluids that contain a lot of sugar or caffeine, such as energy drinks, sports drinks, and soda.  Eat bland, easy-to-digest foods in small amounts as you are able. These foods include bananas, applesauce, rice,  lean meats, toast, and crackers.  Avoid alcohol.  Avoid spicy or fatty foods.  Medicines  Take over-the-counter and prescription medicines only as told by your health care provider.  If you were prescribed an antibiotic medicine, take it as told by your health care provider. Do not stop using the antibiotic even if you start to feel better. General instructions   Wash your hands often using soap and water. If soap and water are not available, use a hand sanitizer. Others in the household should wash their hands as well. Hands should be washed: ? After using the toilet or changing a diaper. ? Before preparing, cooking, or serving food. ? While caring for a sick person or while visiting someone in a hospital.  Drink enough fluid to keep your urine pale yellow.  Rest at home while you recover.  Watch your condition for any changes.  Take a warm bath to relieve any burning or pain from frequent diarrhea episodes.  Keep all follow-up visits as  told by your health care provider. This is important. Contact a health care provider if:  You have a fever.  Your diarrhea gets worse.  You have new symptoms.  You cannot keep fluids down.  You feel light-headed or dizzy.  You have a headache.  You have muscle cramps. Get help right away if:  You have chest pain.  You feel extremely weak or you faint.  You have bloody or black stools or stools that look like tar.  You have severe pain, cramping, or bloating in your abdomen.  You have trouble breathing or you are breathing very quickly.  Your heart is beating very quickly.  Your skin feels cold and clammy.  You feel confused.  You have signs of dehydration, such as: ? Dark urine, very little urine, or no urine. ? Cracked lips. ? Dry mouth. ? Sunken eyes. ? Sleepiness. ? Weakness. Summary  Diarrhea is frequent loose and watery bowel movements. Diarrhea can make you feel weak and cause you to become  dehydrated.  Drink enough fluids to keep your urine pale yellow.  Make sure that you wash your hands after using the toilet. If soap and water are not available, use hand sanitizer.  Contact a health care provider if your diarrhea gets worse or you have new symptoms.  Get help right away if you have signs of dehydration. This information is not intended to replace advice given to you by your health care provider. Make sure you discuss any questions you have with your health care provider. Document Revised: 06/01/2018 Document Reviewed: 06/19/2017 Elsevier Patient Education  [employer].   If you have lab work done today you will be contacted with your lab results within the next 2 weeks.  If you have not heard from [place] then please contact us. The fastest way to get your results is to register for My Chart.   IF you received an x-ray today, you will receive an invoice from [facility]. Please contact [facility] at [phone] with questions or concerns regarding your invoice.   IF you received labwork today, you will receive an invoice from [place]. Please contact [facility] at [phone] with questions or concerns regarding your invoice.   Our billing staff will not be able to assist you with questions regarding bills from these companies.  You will be contacted with the lab results as soon as they are available. The fastest way to get your results is to activate your My Chart account. Instructions are located on the last page of this paperwork. If you have not heard from [place] regarding the results in 2 weeks, please contact this office.

## 2019-09-02 NOTE — Progress Notes (Signed)
Subjective:  Patient ID: Nathan Mora, male    DOB: 09/25/1945  Age: 74 y.o. MRN: [id number]  CC:  Chief Complaint  Patient presents with  . Diarrhea    Pt reports having diarrhea off and on since last visit in march. Pt was told during his last OV that it might be due to his use of stool softner pills. Pt reports since that visit he hasn't taken any stool softners. Pt reports he isn't in any pain nor has the pt noticed any blood in his stool. Pt reports waight loss and fatigue and possibly some dehydration. pt states he tries to drink alot of fluids. pt states he drinks about 48oz's or more perday.     HPI Nathan Mora presents for   Diarrhea: Discussed in March.  At that time he had been using stool softeners and some improvement with discontinuation. Has continued to have diarrhea on and off since that time.  Has avoided use of stool softeners since March visit. Past 6-8 weeks has daily, 1-2 times per day. Watery stool. Has had few nighttime wakenings every 2 weeks to have BM.  No melena/hematochezia. Has had some weight loss, down 6 pounds from March.  Has lost 7 pounds from previous visit at that time but was adjusting his exercise and diet with prediabetes. Decreased appetite past few months. No other diet changes.  Does drink diet cranberry juice.  Colonoscopy in December 2017. Dr. Cristina Gong.  Fatigue, affecting golf game and decreased focus in golf. Normal activity/routines otherwise.  Dizzy at times in the morning. No syncope. No CP/palpitations.  Rare abd pain - few times in past few months only.  No n/v.  Some days has normal BM for a day or two.  No night sweats/fevers.  Alcohol: 1-2 drinks per day, 5 days per week. No changes.    Depression screen [facility] 2/9 09/02/2019 04/08/2019 01/10/2019 10/14/2018 10/14/2018  Decreased Interest 0 0 0 0 0  Down, Depressed, Hopeless 0 0 0 0 0  PHQ - 2 Score 0 0 0 0 0    Wt Readings from Last 3 Encounters:  09/02/19 180 lb (81.6 kg)   04/08/19 186 lb 12.8 oz (84.7 kg)  01/10/19 193 lb 3.2 oz (87.6 kg)   Hyperglycemia/prediabetes: Lab Results  Component Value Date   HGBA1C 6.9 (H) 04/08/2019  A1c higher at March 12 visit, at diabetic level at that time but no new medications were started. Some increased urination, no blurry vision or increased thirst.    History Patient Active Problem List   Diagnosis Date Noted  . Family history of early CAD 04/23/2012  . Hyperlipidemia   . Glaucoma   . Lipid disorder 03/29/2012   Past Medical History:  Diagnosis Date  . Glaucoma    left eye  . Hyperlipidemia   . Lipid disorder 03/29/2012   No past surgical history on file. No Known Allergies Prior to Admission medications   Medication Sig Start Date End Date Taking? Authorizing Provider  atorvastatin (LIPITOR) 40 MG tablet Take 0.5 tablets (20 mg total) by mouth daily at 6 PM. 04/08/19  Yes [name], MD  brimonidine-timolol (COMBIGAN) 0.2-0.5 % ophthalmic solution Place 1 drop into both eyes every 12 (twelve) hours.   Yes [provider]  doxazosin (CARDURA) 2 MG tablet Take 1 tablet (2 mg total) by mouth daily. 04/08/19  Yes [name], MD  latanoprost (XALATAN) 0.005 % ophthalmic solution 1 drop as directed.   Yes [provider]  sildenafil (REVATIO) 20 MG tablet 1 to 3 tabs up to QD prior to onset of sexual activity. 04/08/19  Yes [name], MD   Social History   Socioeconomic History  . Marital status: Married    Spouse name: Not on file  . Number of children: Not on file  . Years of education: Not on file  . Highest education level: Not on file  Occupational History  . Not on file  Tobacco Use  . Smoking status: Never Smoker  . Smokeless tobacco: Never Used  Substance and Sexual Activity  . Alcohol use: Yes    Alcohol/week: 4.0 standard drinks    Types: 4 Cans of beer per week  . Drug use: No  . Sexual activity: Yes  Other Topics Concern  . Not on file  Social  History Narrative  . Not on file   Social Determinants of Health   Financial Resource Strain:   . Difficulty of Paying Living Expenses:   Food Insecurity:   . Worried About Charity fundraiser in the Last Year:   . Arboriculturist in the Last Year:   Transportation Needs:   . Film/video editor (Medical):   Marland Kitchen Lack of Transportation (Non-Medical):   Physical Activity:   . Days of Exercise per Week:   . Minutes of Exercise per Session:   Stress:   . Feeling of Stress :   Social Connections:   . Frequency of Communication with Friends and Family:   . Frequency of Social Gatherings with Friends and Family:   . Attends Religious Services:   . Active Member of Clubs or Organizations:   . Attends Archivist Meetings:   Marland Kitchen Marital Status:   Intimate Partner Violence:   . Fear of Current or Ex-Partner:   . Emotionally Abused:   Marland Kitchen Physically Abused:   . Sexually Abused:     Review of Systems  Per HPI  Objective:   Vitals:   09/02/19 0846  BP: 118/73  Pulse: 65  Temp: 98.1 F (36.7 C)  TempSrc: Temporal  SpO2: 99%  Weight: 180 lb (81.6 kg)  Height: 5\' 8"  (1.727 m)     Physical Exam Vitals reviewed.  Constitutional:      General: He is not in acute distress.    Appearance: Normal appearance. He is well-developed. He is not ill-appearing, toxic-appearing or diaphoretic.  HENT:     Head: Normocephalic and atraumatic.  Eyes:     Pupils: Pupils are equal, round, and reactive to light.  Neck:     Vascular: No carotid bruit or JVD.  Cardiovascular:     Rate and Rhythm: Normal rate and regular rhythm.     Heart sounds: Normal heart sounds. No murmur heard.   Pulmonary:     Effort: Pulmonary effort is normal.     Breath sounds: Normal breath sounds. No rales.  Abdominal:     General: Abdomen is flat. There is no distension.     Palpations: There is no mass.     Tenderness: There is no abdominal tenderness. There is no guarding.  Skin:    General: Skin  is warm and dry.  Neurological:     General: No focal deficit present.     Mental Status: He is alert and oriented to person, place, and time.    Orthostatic VS for the past 24 hrs (Last 3 readings):  BP- Lying Pulse- Lying BP- Standing at 0 minutes Pulse-  Standing at 0 minutes BP- Standing at 3 minutes Pulse- Standing at 3 minutes  09/02/19 0929 120/79 61 106/67 80 130/85 72   Results for orders placed or performed in visit on 09/02/19  POCT CBC  Result Value Ref Range   WBC 6.1 4.6 - 10.2 K/uL   Lymph, poc 3.3 0.6 - 3.4   POC LYMPH PERCENT 54.6 (A) 10 - 50 %L   MID (cbc) 0.4 0 - 0.9   POC MID % 6.9 0 - 12 %M   POC Granulocyte 2.3 2 - 6.9   Granulocyte percent 38.5 37 - 80 %G   RBC 4.70 4.69 - 6.13 M/uL   Hemoglobin 14.4 11 - 14.6 g/dL   HCT, POC 43.3 (A) 29 - 41 %   MCV 92.2 76 - 111 fL   MCH, POC 30.7 27 - 31.2 pg   MCHC 33.3 31.8 - 35.4 g/dL   RDW, POC 13.1 %   Platelet Count, POC 111 (A) 142 - 424 K/uL   MPV 9.1 0 - 99.8 fL  POCT glucose (manual entry)  Result Value Ref Range   POC Glucose 163 (A) 70 - 99 mg/dl      Assessment & Plan:  Nathan Mora is a 74 y.o. male . Diarrhea, unspecified type - Plan: Ambulatory referral to Gastroenterology Episode of dizziness - Plan: Comprehensive metabolic panel, TSH, POCT CBC, Orthostatic vital signs Other fatigue - Plan: Comprehensive metabolic panel, TSH, POCT CBC, POCT glucose (manual entry), Ambulatory referral to Gastroenterology Loss of weight - Plan: TSH, Ambulatory referral to Gastroenterology  -Recurrent diarrhea with weight loss as above.  Check TSH, refer to gastroenterology.  -Episodic dizziness, may have volume depletion component.  Increased hydration discussed, check TSH, reassuring CBC.  Elevated glucose, will check A1c with ER/RTC precautions.  Hyperglycemia - Plan: Hemoglobin A1c   No orders of the defined types were placed in this encounter.  Patient Instructions     I will check some labs including a  31-month blood sugar test as blood sugar was elevated in the office.  If that number has increased we may need to consider medication.  That can also contribute to weight loss. Avoid diet drinks for now.  Water is best, and make sure to drink 64 ounces per day.  If dizziness more often/worse - return for recheck.  I will refer you to gastroenterology to discuss diarrhea further.  See information below. Return to the clinic or go to the nearest emergency room if any of your symptoms worsen or new symptoms occur.  Dizziness Dizziness is a common problem. It is a feeling of unsteadiness or light-headedness. You may feel like you are about to faint. Dizziness can lead to injury if you stumble or fall. Anyone can become dizzy, but dizziness is more common in older adults. This condition can be caused by a number of things, including medicines, dehydration, or illness. Follow these instructions at home: Eating and drinking  Drink enough fluid to keep your urine clear or pale yellow. This helps to keep you from becoming dehydrated. Try to drink more clear fluids, such as water.  Do not drink alcohol.  Limit your caffeine intake if told to do so by your health care provider. Check ingredients and nutrition facts to see if a food or beverage contains caffeine.  Limit your salt (sodium) intake if told to do so by your health care provider. Check ingredients and nutrition facts to see if a food or beverage contains sodium. Activity  Avoid making quick movements. ? Rise slowly from chairs and steady yourself until you feel okay. ? In the morning, first sit up on the side of the bed. When you feel okay, stand slowly while you hold onto something until you know that your balance is fine.  If you need to stand in one place for a long time, move your legs often. Tighten and relax the muscles in your legs while you are standing.  Do not drive or use heavy machinery if you feel dizzy.  Avoid bending down if  you feel dizzy. Place items in your home so that they are easy for you to reach without leaning over. Lifestyle  Do not use any products that contain nicotine or tobacco, such as cigarettes and e-cigarettes. If you need help quitting, ask your health care provider.  Try to reduce your stress level by using methods such as yoga or meditation. Talk with your health care provider if you need help to manage your stress. General instructions  Watch your dizziness for any changes.  Take over-the-counter and prescription medicines only as told by your health care provider. Talk with your health care provider if you think that your dizziness is caused by a medicine that you are taking.  Tell a friend or a family member that you are feeling dizzy. If he or she notices any changes in your behavior, have this person call your health care provider.  Keep all follow-up visits as told by your health care provider. This is important. Contact a health care provider if:  Your dizziness does not go away.  Your dizziness or light-headedness gets worse.  You feel nauseous.  You have reduced hearing.  You have new symptoms.  You are unsteady on your feet or you feel like the room is spinning. Get help right away if:  You vomit or have diarrhea and are unable to eat or drink anything.  You have problems talking, walking, swallowing, or using your arms, hands, or legs.  You feel generally weak.  You are not thinking clearly or you have trouble forming sentences. It may take a friend or family member to notice this.  You have chest pain, abdominal pain, shortness of breath, or sweating.  Your vision changes.  You have any bleeding.  You have a severe headache.  You have neck pain or a stiff neck.  You have a fever. These symptoms may represent a serious problem that is an emergency. Do not wait to see if the symptoms will go away. Get medical help right away. Call your local emergency  services (911 in the [place] Do not drive yourself to the hospital. Summary  Dizziness is a feeling of unsteadiness or light-headedness. This condition can be caused by a number of things, including medicines, dehydration, or illness.  Anyone can become dizzy, but dizziness is more common in older adults.  Drink enough fluid to keep your urine clear or pale yellow. Do not drink alcohol.  Avoid making quick movements if you feel dizzy. Monitor your dizziness for any changes. This information is not intended to replace advice given to you by your health care provider. Make sure you discuss any questions you have with your health care provider. Document Revised: 01/16/2017 Document Reviewed: 02/16/2016 Elsevier Patient Education  [place].  Diarrhea, Adult Diarrhea is frequent loose and watery bowel movements. Diarrhea can make you feel weak and cause you to become dehydrated. Dehydration can make you tired and thirsty, cause you to  have a dry mouth, and decrease how often you urinate. Diarrhea typically lasts 2-3 days. However, it can last longer if it is a sign of something more serious. It is important to treat your diarrhea as told by your health care provider. Follow these instructions at home: Eating and drinking     Follow these recommendations as told by your health care provider:  Take an oral rehydration solution (ORS). This is an over-the-counter medicine that helps return your body to its normal balance of nutrients and water. It is found at pharmacies and retail stores.  Drink plenty of fluids, such as water, ice chips, diluted fruit juice, and low-calorie sports drinks. You can drink milk also, if desired.  Avoid drinking fluids that contain a lot of sugar or caffeine, such as energy drinks, sports drinks, and soda.  Eat bland, easy-to-digest foods in small amounts as you are able. These foods include bananas, applesauce, rice, lean meats, toast, and  crackers.  Avoid alcohol.  Avoid spicy or fatty foods.  Medicines  Take over-the-counter and prescription medicines only as told by your health care provider.  If you were prescribed an antibiotic medicine, take it as told by your health care provider. Do not stop using the antibiotic even if you start to feel better. General instructions   Wash your hands often using soap and water. If soap and water are not available, use a hand sanitizer. Others in the household should wash their hands as well. Hands should be washed: ? After using the toilet or changing a diaper. ? Before preparing, cooking, or serving food. ? While caring for a sick person or while visiting someone in a hospital.  Drink enough fluid to keep your urine pale yellow.  Rest at home while you recover.  Watch your condition for any changes.  Take a warm bath to relieve any burning or pain from frequent diarrhea episodes.  Keep all follow-up visits as told by your health care provider. This is important. Contact a health care provider if:  You have a fever.  Your diarrhea gets worse.  You have new symptoms.  You cannot keep fluids down.  You feel light-headed or dizzy.  You have a headache.  You have muscle cramps. Get help right away if:  You have chest pain.  You feel extremely weak or you faint.  You have bloody or black stools or stools that look like tar.  You have severe pain, cramping, or bloating in your abdomen.  You have trouble breathing or you are breathing very quickly.  Your heart is beating very quickly.  Your skin feels cold and clammy.  You feel confused.  You have signs of dehydration, such as: ? Dark urine, very little urine, or no urine. ? Cracked lips. ? Dry mouth. ? Sunken eyes. ? Sleepiness. ? Weakness. Summary  Diarrhea is frequent loose and watery bowel movements. Diarrhea can make you feel weak and cause you to become dehydrated.  Drink enough fluids to  keep your urine pale yellow.  Make sure that you wash your hands after using the toilet. If soap and water are not available, use hand sanitizer.  Contact a health care provider if your diarrhea gets worse or you have new symptoms.  Get help right away if you have signs of dehydration. This information is not intended to replace advice given to you by your health care provider. Make sure you discuss any questions you have with your health care provider. Document Revised: 06/01/2018  Document Reviewed: 06/19/2017 Elsevier Patient Education  [employer].   If you have lab work done today you will be contacted with your lab results within the next 2 weeks.  If you have not heard from [place] then please contact us. The fastest way to get your results is to register for My Chart.   IF you received an x-ray today, you will receive an invoice from [facility]. Please contact [facility] at [phone] with questions or concerns regarding your invoice.   IF you received labwork today, you will receive an invoice from [facility]. Please contact [facility] at [phone] with questions or concerns regarding your invoice.   Our billing staff will not be able to assist you with questions regarding bills from these companies.  You will be contacted with the lab results as soon as they are available. The fastest way to get your results is to activate your My Chart account. Instructions are located on the last page of this paperwork. If you have not heard from [place] regarding the results in 2 weeks, please contact this office.         Signed, [name], MD [facility]

## 2019-09-03 LAB — COMPREHENSIVE METABOLIC PANEL
ALT: 60 IU/L — ABNORMAL HIGH (ref 0–44)
AST: 28 IU/L (ref 0–40)
Albumin/Globulin Ratio: 2.8 — ABNORMAL HIGH (ref 1.2–2.2)
Albumin: 4.5 g/dL (ref 3.7–4.7)
Alkaline Phosphatase: 80 IU/L (ref 48–121)
BUN/Creatinine Ratio: 14 (ref 10–24)
BUN: 12 mg/dL (ref 8–27)
Bilirubin Total: 0.7 mg/dL (ref 0.0–1.2)
CO2: 23 mmol/L (ref 20–29)
Calcium: 8.9 mg/dL (ref 8.6–10.2)
Chloride: 103 mmol/L (ref 96–106)
Creatinine, Ser: 0.84 mg/dL (ref 0.76–1.27)
GFR calc Af Amer: 100 mL/min/{1.73_m2} (ref >59)
GFR calc non Af Amer: 87 mL/min/{1.73_m2} (ref >59)
Globulin, Total: 1.6 g/dL (ref 1.5–4.5)
Glucose: 159 mg/dL — ABNORMAL HIGH (ref 65–99)
Potassium: 4.3 mmol/L (ref 3.5–5.2)
Sodium: 139 mmol/L (ref 134–144)
Total Protein: 6.1 g/dL (ref 6.0–8.5)

## 2019-09-03 LAB — HEMOGLOBIN A1C
Est. average glucose Bld gHb Est-mCnc: 163 mg/dL
Hgb A1c MFr Bld: 7.3 % — ABNORMAL HIGH (ref 4.8–5.6)

## 2019-09-03 LAB — TSH: TSH: 0.999 u[IU]/mL (ref 0.450–4.500)

## 2019-09-05 ENCOUNTER — Encounter: Payer: Self-pay | Admitting: Family Medicine

## 2019-09-14 DIAGNOSIS — K591 Functional diarrhea: Secondary | ICD-10-CM | POA: Diagnosis not present

## 2019-09-14 DIAGNOSIS — R198 Other specified symptoms and signs involving the digestive system and abdomen: Secondary | ICD-10-CM | POA: Diagnosis not present

## 2019-09-15 ENCOUNTER — Ambulatory Visit (INDEPENDENT_AMBULATORY_CARE_PROVIDER_SITE_OTHER): Payer: Medicare HMO | Admitting: Family Medicine

## 2019-09-15 ENCOUNTER — Encounter: Payer: Self-pay | Admitting: Family Medicine

## 2019-09-15 ENCOUNTER — Other Ambulatory Visit: Payer: Self-pay

## 2019-09-15 VITALS — BP 119/76 | HR 57 | Temp 98.0°F | Ht 68.0 in | Wt 181.0 lb

## 2019-09-15 DIAGNOSIS — R42 Dizziness and giddiness: Secondary | ICD-10-CM

## 2019-09-15 DIAGNOSIS — R739 Hyperglycemia, unspecified: Secondary | ICD-10-CM

## 2019-09-15 DIAGNOSIS — R197 Diarrhea, unspecified: Secondary | ICD-10-CM | POA: Diagnosis not present

## 2019-09-15 DIAGNOSIS — E119 Type 2 diabetes mellitus without complications: Secondary | ICD-10-CM | POA: Diagnosis not present

## 2019-09-15 MED ORDER — BLOOD GLUCOSE METER KIT: PACK | 0 refills | Status: DC

## 2019-09-15 NOTE — Patient Instructions (Addendum)
I'm glad to hear you are feeling better. If diarrhea returns, follow up again with Dr. Cristina Gong sooner.  Continue to drink fluids, bland diet.   No new meds for blood sugar at this time. Update me on home readings (once per day - fasting or 2 hr after meal) in next 2 weeks. Recheck in 6 weeks for possible medication discussion.   Return to the clinic or go to the nearest emergency room if any of your symptoms worsen or new symptoms occur.    If you have lab work done today you will be contacted with your lab results within the next 2 weeks.  If you have not heard from [place] then please contact us. The fastest way to get your results is to register for My Chart.   IF you received an x-ray today, you will receive an invoice from [facility]. Please contact [facility] at [phone] with questions or concerns regarding your invoice.   IF you received labwork today, you will receive an invoice from [facility]. Please contact [facility] at [phone] with questions or concerns regarding your invoice.   Our billing staff will not be able to assist you with questions regarding bills from these companies.  You will be contacted with the lab results as soon as they are available. The fastest way to get your results is to activate your My Chart account. Instructions are located on the last page of this paperwork. If you have not heard from [place] regarding the results in 2 weeks, please contact this office.

## 2019-09-15 NOTE — Progress Notes (Signed)
Subjective:  Patient ID: Nathan Mora, male    DOB: Oct 16, 1945  Age: 74 y.o. MRN: [id number]  CC:  Chief Complaint  Patient presents with  . Follow-up    on dizzieness. Pt reports for the past week he hasn't felt dizzy. Pt reports theis 2nd week since his last OV he feels all around much better. pt reports his apitite is back, less fatigue, and no more diarrhea.     HPI Nathan Mora presents for   Diarrhea/dizziness, weight loss.  Last office visit August 6.  TSH obtained, referred to gastroenterology.  Increase hydration discussed as possible volume depletion.  Did have hyperglycemia with A1c of 7.3.  Initially held on new medicines.  Gastroenterology evaluation yesterday.  Feeling better since last visit. No further dizziness. Better appetite.  No diarrhea in last week. Fatigue has improved.  Saw GI yesterday. Thinks that may have had some initial food poisoning. Did not think other concerning underlying process. No further workup at this time unless diarrhea returns, probiotics recommended - but not yet. Eating a banana or two per day.   Lab Results  Component Value Date   HGBA1C 7.3 (H) 09/02/2019   Wt Readings from Last 3 Encounters:  09/15/19 181 lb (82.1 kg)  09/02/19 180 lb (81.6 kg)  04/08/19 186 lb 12.8 oz (84.7 kg)    History Patient Active Problem List   Diagnosis Date Noted  . Family history of early CAD 04/23/2012  . Hyperlipidemia   . Glaucoma   . Lipid disorder 03/29/2012   Past Medical History:  Diagnosis Date  . Glaucoma    left eye  . Hyperlipidemia   . Lipid disorder 03/29/2012   No past surgical history on file. No Known Allergies Prior to Admission medications   Medication Sig Start Date End Date Taking? Authorizing Provider  atorvastatin (LIPITOR) 40 MG tablet Take 0.5 tablets (20 mg total) by mouth daily at 6 PM. 04/08/19  Yes [name], MD  brimonidine-timolol (COMBIGAN) 0.2-0.5 % ophthalmic solution Place 1 drop into both eyes every 12  (twelve) hours.   Yes [provider]  doxazosin (CARDURA) 2 MG tablet Take 1 tablet (2 mg total) by mouth daily. 04/08/19  Yes [name], MD  latanoprost (XALATAN) 0.005 % ophthalmic solution 1 drop as directed.   Yes [provider]  sildenafil (REVATIO) 20 MG tablet 1 to 3 tabs up to QD prior to onset of sexual activity. 04/08/19  Yes [name], MD   Social History   Socioeconomic History  . Marital status: Married    Spouse name: Not on file  . Number of children: Not on file  . Years of education: Not on file  . Highest education level: Not on file  Occupational History  . Not on file  Tobacco Use  . Smoking status: Never Smoker  . Smokeless tobacco: Never Used  Substance and Sexual Activity  . Alcohol use: Yes    Alcohol/week: 4.0 standard drinks    Types: 4 Cans of beer per week  . Drug use: No  . Sexual activity: Yes  Other Topics Concern  . Not on file  Social History Narrative  . Not on file   Social Determinants of Health   Financial Resource Strain:   . Difficulty of Paying Living Expenses: Not on file  Food Insecurity:   . Worried About Charity fundraiser in the Last Year: Not on file  . Ran Out of Food in the  Last Year: Not on file  Transportation Needs:   . Lack of Transportation (Medical): Not on file  . Lack of Transportation (Non-Medical): Not on file  Physical Activity:   . Days of Exercise per Week: Not on file  . Minutes of Exercise per Session: Not on file  Stress:   . Feeling of Stress : Not on file  Social Connections:   . Frequency of Communication with Friends and Family: Not on file  . Frequency of Social Gatherings with Friends and Family: Not on file  . Attends Religious Services: Not on file  . Active Member of Clubs or Organizations: Not on file  . Attends Archivist Meetings: Not on file  . Marital Status: Not on file  Intimate Partner Violence:   . Fear of Current or Ex-Partner: Not on file   . Emotionally Abused: Not on file  . Physically Abused: Not on file  . Sexually Abused: Not on file    Review of Systems Per HPI.   Objective:   Vitals:   09/15/19 0855  BP: 119/76  Pulse: (!) 57  Temp: 98 F (36.7 C)  TempSrc: Temporal  SpO2: 98%  Weight: 181 lb (82.1 kg)  Height: _0  (1.727 m)     Physical Exam Vitals reviewed.  Constitutional:      Appearance: He is well-developed.  HENT:     Head: Normocephalic and atraumatic.  Eyes:     Pupils: Pupils are equal, round, and reactive to light.  Neck:     Vascular: No carotid bruit or JVD.  Cardiovascular:     Rate and Rhythm: Normal rate and regular rhythm.     Heart sounds: Normal heart sounds. No murmur heard.   Pulmonary:     Effort: Pulmonary effort is normal.     Breath sounds: Normal breath sounds. No rales.  Skin:    General: Skin is warm and dry.  Neurological:     Mental Status: He is alert and oriented to person, place, and time.     Assessment & Plan:  Nathan Mora is a 74 y.o. male . Diarrhea, unspecified type  -Currently asymptomatic, status post gastroenterology eval without concerning symptoms, continue to monitor with GI follow-up if needed.  Episode of dizziness  -Improved, fatigue improved, may have had component of volume depletion.  Continue regular diet, fluids, RTC precautions.  Hyperglycemia - Plan: blood glucose meter kit and supplies Type 2 diabetes mellitus without complication, without long-term current use of insulin ([place]) - Plan: blood glucose meter kit and supplies  -A1c at level of diabetes, decided against initial medications especially with diarrhea as above.  We will have him check home blood sugar readings fasting or 2 hours postprandial over the next few weeks with update, then 6 weeks in office appointment   Meds ordered this encounter  Medications  . blood glucose meter kit and supplies    Sig: Test once per day - fasting or 2 hours after meal. Dispense based  on patient and insurance preference.    Dispense:  1 each    Refill:  0    Order Specific Question:   Number of strips    Answer:   100    Order Specific Question:   Number of lancets    Answer:   100   Patient Instructions   I'm glad to hear you are feeling better. If diarrhea returns, follow up again with Dr. Cristina Gong sooner.  Continue to drink fluids, bland diet.  No new meds for blood sugar at this time. Update me on home readings (once per day - fasting or 2 hr after meal) in next 2 weeks. Recheck in 6 weeks for possible medication discussion.   Return to the clinic or go to the nearest emergency room if any of your symptoms worsen or new symptoms occur.    If you have lab work done today you will be contacted with your lab results within the next 2 weeks.  If you have not heard from [place] then please contact us. The fastest way to get your results is to register for My Chart.   IF you received an x-ray today, you will receive an invoice from [facility]. Please contact [facility] at [phone] with questions or concerns regarding your invoice.   IF you received labwork today, you will receive an invoice from [facility]. Please contact [facility] at [phone] with questions or concerns regarding your invoice.   Our billing staff will not be able to assist you with questions regarding bills from these companies.  You will be contacted with the lab results as soon as they are available. The fastest way to get your results is to activate your My Chart account. Instructions are located on the last page of this paperwork. If you have not heard from [place] regarding the results in 2 weeks, please contact this office.         Signed, [name], MD [facility]

## 2019-09-17 DIAGNOSIS — R69 Illness, unspecified: Secondary | ICD-10-CM | POA: Diagnosis not present

## 2019-10-03 ENCOUNTER — Encounter: Payer: Self-pay | Admitting: Family Medicine

## 2019-10-04 NOTE — Telephone Encounter (Signed)
Pt sent you message with glucose readings from Am fasting.  Anything that I should advise?

## 2019-10-06 ENCOUNTER — Ambulatory Visit: Payer: Medicare HMO | Admitting: Family Medicine

## 2019-10-20 ENCOUNTER — Ambulatory Visit (INDEPENDENT_AMBULATORY_CARE_PROVIDER_SITE_OTHER): Payer: Medicare HMO | Admitting: Family Medicine

## 2019-10-20 ENCOUNTER — Other Ambulatory Visit: Payer: Self-pay

## 2019-10-20 ENCOUNTER — Encounter: Payer: Self-pay | Admitting: Family Medicine

## 2019-10-20 VITALS — BP 125/81 | HR 60 | Temp 98.2°F | Ht 68.0 in | Wt 181.0 lb

## 2019-10-20 DIAGNOSIS — E785 Hyperlipidemia, unspecified: Secondary | ICD-10-CM | POA: Diagnosis not present

## 2019-10-20 DIAGNOSIS — E1165 Type 2 diabetes mellitus with hyperglycemia: Secondary | ICD-10-CM

## 2019-10-20 DIAGNOSIS — R350 Frequency of micturition: Secondary | ICD-10-CM | POA: Diagnosis not present

## 2019-10-20 DIAGNOSIS — R351 Nocturia: Secondary | ICD-10-CM | POA: Diagnosis not present

## 2019-10-20 MED ORDER — ATORVASTATIN CALCIUM 40 MG PO TABS: 20.0000 mg | ORAL_TABLET | Freq: Every day | ORAL | 2 refills | Status: DC

## 2019-10-20 MED ORDER — DOXAZOSIN MESYLATE 2 MG PO TABS: 2.0000 mg | ORAL_TABLET | Freq: Every day | ORAL | 2 refills | Status: DC

## 2019-10-20 MED ORDER — METFORMIN HCL 500 MG PO TABS: 500.0000 mg | ORAL_TABLET | Freq: Every day | ORAL | 1 refills | Status: DC

## 2019-10-20 NOTE — Progress Notes (Signed)
Subjective:  Patient ID: Nathan Mora, male    DOB: Sep 02, 1945  Age: 74 y.o. MRN: [id number]  CC:  Chief Complaint  Patient presents with  . Follow-up    6 week check on diabetes. PT reports no change to this condition that he is aware of. Pt checks his BS daily and gets anying between 129m/dl-175mg/dl.     HPI Nathan Balsampresents for   Diabetes: Mild hyperglycemia, A1c at diabetic level initially in March, increased to 7.3 August 6. With previous diarrhea, held on medications. Home readings sent September 6, ranging from 111-177. All fasting readings. Range still 120-175 depending on diet at night. No postprandials. Does like sweets, ice cream- has cut back on both.  Microalbumin: Will check today.  No further regular diarrhea. Normal BM every 3 days. Soft stool.  Optho: Dr. PSharen Counter has appt in November.   Not fasting today.   Had covid booster.   Lab Results  Component Value Date   HGBA1C 7.3 (H) 09/02/2019   HGBA1C 6.9 (H) 04/08/2019   HGBA1C 6.0 (H) 10/14/2018   Lab Results  Component Value Date   LDLCALC 68 04/08/2019   CREATININE 0.84 09/02/2019      History Patient Active Problem List   Diagnosis Date Noted  . Family history of early CAD 04/23/2012  . Hyperlipidemia   . Glaucoma   . Lipid disorder 03/29/2012   Past Medical History:  Diagnosis Date  . Glaucoma    left eye  . Hyperlipidemia   . Lipid disorder 03/29/2012   No past surgical history on file. No Known Allergies Prior to Admission medications   Medication Sig Start Date End Date Taking? Authorizing Provider  atorvastatin (LIPITOR) 40 MG tablet Take 0.5 tablets (20 mg total) by mouth daily at 6 PM. 04/08/19  Yes GWendie Agreste MD  blood glucose meter kit and supplies Test once per day - fasting or 2 hours after meal. Dispense based on patient and insurance preference. 09/15/19  Yes GWendie Agreste MD  brimonidine-timolol (COMBIGAN) 0.2-0.5 % ophthalmic solution Place 1 drop into both eyes  every 12 (twelve) hours.   Yes [provider]  doxazosin (CARDURA) 2 MG tablet Take 1 tablet (2 mg total) by mouth daily. 04/08/19  Yes GWendie Agreste MD  latanoprost (XALATAN) 0.005 % ophthalmic solution 1 drop as directed.   Yes [provider]  sildenafil (REVATIO) 20 MG tablet 1 to 3 tabs up to QD prior to onset of sexual activity. 04/08/19  Yes GWendie Agreste MD   Social History   Socioeconomic History  . Marital status: Married    Spouse name: Not on file  . Number of children: Not on file  . Years of education: Not on file  . Highest education level: Not on file  Occupational History  . Not on file  Tobacco Use  . Smoking status: Never Smoker  . Smokeless tobacco: Never Used  Substance and Sexual Activity  . Alcohol use: Yes    Alcohol/week: 4.0 standard drinks    Types: 4 Cans of beer per week  . Drug use: No  . Sexual activity: Yes  Other Topics Concern  . Not on file  Social History Narrative  . Not on file   Social Determinants of Health   Financial Resource Strain:   . Difficulty of Paying Living Expenses: Not on file  Food Insecurity:   . Worried About RCharity fundraiserin the Last Year: Not on file  .  Ran Out of Food in the Last Year: Not on file  Transportation Needs:   . Lack of Transportation (Medical): Not on file  . Lack of Transportation (Non-Medical): Not on file  Physical Activity:   . Days of Exercise per Week: Not on file  . Minutes of Exercise per Session: Not on file  Stress:   . Feeling of Stress : Not on file  Social Connections:   . Frequency of Communication with Friends and Family: Not on file  . Frequency of Social Gatherings with Friends and Family: Not on file  . Attends Religious Services: Not on file  . Active Member of Clubs or Organizations: Not on file  . Attends Archivist Meetings: Not on file  . Marital Status: Not on file  Intimate Partner Violence:   . Fear of Current or Ex-Partner:  Not on file  . Emotionally Abused: Not on file  . Physically Abused: Not on file  . Sexually Abused: Not on file    Review of Systems Per hpi.  No nausea/vomiting, no abdominal pain, no chest pain.   Objective:   Vitals:   10/20/19 0906  BP: 125/81  Pulse: 60  Temp: 98.2 F (36.8 C)  TempSrc: Temporal  SpO2: 97%  Weight: 181 lb (82.1 kg)  Height: '5\' 8"'  (1.727 m)     Physical Exam Vitals reviewed.  Constitutional:      Appearance: He is well-developed.  HENT:     Head: Normocephalic and atraumatic.  Eyes:     Pupils: Pupils are equal, round, and reactive to light.  Neck:     Vascular: No carotid bruit or JVD.  Cardiovascular:     Rate and Rhythm: Normal rate and regular rhythm.     Heart sounds: Normal heart sounds. No murmur heard.   Pulmonary:     Effort: Pulmonary effort is normal.     Breath sounds: Normal breath sounds. No rales.  Skin:    General: Skin is warm and dry.  Neurological:     Mental Status: He is alert and oriented to person, place, and time.      34 minutes spent during visit, greater than 50% counseling and assimilation of information, chart review, and discussion of plan.    Assessment & Plan:  Nathan Mora is a 74 y.o. male . Type 2 diabetes mellitus with hyperglycemia, without long-term current use of insulin (HCC) - Plan: Microalbumin / creatinine urine ratio, metFORMIN (GLUCOPHAGE) 500 MG tablet  -Option of further diet changes versus trial of Metformin.  He would like to try low-dose Metformin, cautioned on possible diarrhea, if that occurs stop that medication.  Handout given on diabetes and self-care.  Commended on some decreasing sweets/ice cream.  Home readings discussed with either fasting or 2 hours postprandial.  45-monthrecheck with fasting labs at that time.  Has ophthalmology appointment pending, advised on retinopathy screening.  Hyperlipidemia, unspecified hyperlipidemia type - Plan: atorvastatin (LIPITOR) 40 MG  tablet  -Tolerating Lipitor, continue same, plan fasting labs next visit  Urinary frequency - Plan: doxazosin (CARDURA) 2 MG tablet Nocturia - Plan: doxazosin (CARDURA) 2 MG tablet  -Stable with Cardura, sometimes able to sleep the entire night without nocturia.  Refill the same dose.   Meds ordered this encounter  Medications  . atorvastatin (LIPITOR) 40 MG tablet    Sig: Take 0.5 tablets (20 mg total) by mouth daily at 6 PM.    Dispense:  45 tablet    Refill:  2  .  doxazosin (CARDURA) 2 MG tablet    Sig: Take 1 tablet (2 mg total) by mouth daily.    Dispense:  90 tablet    Refill:  2  . metFORMIN (GLUCOPHAGE) 500 MG tablet    Sig: Take 1 tablet (500 mg total) by mouth daily with breakfast.    Dispense:  90 tablet    Refill:  1   Patient Instructions   Advise your eye specialist you have diabetes - he should send me a report for retinopathy screening at your next appointment if possible.  If any diarrhea with metformin, stop that med and let me know.   2 month recheck - fasting labs at that time. Let me know if there are questions.    Type 2 Diabetes Mellitus, Self Care, Adult Caring for yourself after you have been diagnosed with type 2 diabetes (type 2 diabetes mellitus) means keeping your blood sugar (glucose) under control with a balance of:  Nutrition.  Exercise.  Lifestyle changes.  Medicines or insulin, if necessary.  Support from your team of health care providers and others. The following information explains what you need to know to manage your diabetes at home. What are the risks? Having diabetes can put you at risk for other long-term (chronic) conditions, such as heart disease and kidney disease. Your health care provider may prescribe medicines to help prevent complications from diabetes. These medicines may include:  Aspirin.  Medicine to lower cholesterol.  Medicine to control blood pressure. How to monitor blood glucose   Check your blood  glucose every day, as often as told by your health care provider.  Have your A1c (hemoglobin A1c) level checked two or more times a year, or as often as told by your health care provider. Your health care provider will set individualized treatment goals for you. Generally, the goal of treatment is to maintain the following blood glucose levels:  Before meals (preprandial): 80-130 mg/dL (4.4-7.2 mmol/L).  After meals (postprandial): below 180 mg/dL (10 mmol/L).  A1c level: less than 7%. How to manage hyperglycemia and hypoglycemia Hyperglycemia symptoms Hyperglycemia, also called high blood glucose, occurs when blood glucose is too high. Make sure you know the early signs of hyperglycemia, such as:  Increased thirst.  Hunger.  Feeling very tired.  Needing to urinate more often than usual.  Blurry vision. Hypoglycemia symptoms Hypoglycemia, also called low blood glucose, occurswith a blood glucose level at or below 70 mg/dL (3.9 mmol/L). The risk for hypoglycemia increases during or after exercise, during sleep, during illness, and when skipping meals or not eating for a long time (fasting). It is important to know the symptoms of hypoglycemia and treat it right away. Always have a 15-gram rapid-acting carbohydrate snack with you to treat low blood glucose. Family members and close friends should also know the symptoms and should understand how to treat hypoglycemia, in case you are not able to treat yourself. Symptoms may include:  Hunger.  Anxiety.  Sweating and feeling clammy.  Confusion.  Dizziness or feeling light-headed.  Sleepiness.  Nausea.  Increased heart rate.  Headache.  Blurry vision.  Irritability.  A change in coordination.  Tingling or numbness around the mouth, lips, or tongue.  Restless sleep.  Fainting.  Seizure. Treating hypoglycemia If you are alert and able to swallow safely, follow the 15:15 rule:  Take 15 grams of a rapid-acting  carbohydrate. Talk with your health care provider about how much you should take.  Rapid-acting options include: ? Glucose pills (  take 15 grams). ? 6-8 pieces of hard candy. ? 4-6 oz (120-150 mL) of fruit juice. ? 4-6 oz (120-150 mL) of regular (not diet) soda. ? 1 Tbsp (15 mL) honey or sugar.  Check your blood glucose 15 minutes after you take the carbohydrate.  If the repeat blood glucose level is still at or below 70 mg/dL (3.9 mmol/L), take 15 grams of a carbohydrate again.  If your blood glucose level does not increase above 70 mg/dL (3.9 mmol/L) after 3 tries, seek emergency medical care.  After your blood glucose level returns to normal, eat a meal or a snack within 1 hour. Treating severe hypoglycemia Severe hypoglycemia is when your blood glucose level is at or below 54 mg/dL (3 mmol/L). Severe hypoglycemia is an emergency. Do not wait to see if the symptoms will go away. Get medical help right away. Call your local emergency services (911 in the [place] If you have severe hypoglycemia and you cannot eat or drink, you may need an injection of glucagon. A family member or close friend should learn how to check your blood glucose and how to give you a glucagon injection. Ask your health care provider if you need to have an emergency glucagon injection kit available. Severe hypoglycemia may need to be treated in a hospital. The treatment may include getting glucose through an IV. You may also need treatment for the cause of your hypoglycemia. Follow these instructions at home: Take diabetes medicines as told  If your health care provider prescribed insulin or diabetes medicines, take them every day.  Do not run out of insulin or other diabetes medicines that you take. Plan ahead so you always have these available.  If you use insulin, adjust your dosage based on how physically active you are and what foods you eat. Your health care provider will tell you how to adjust your  dosage. Make healthy food choices  The things that you eat and drink affect your blood glucose and your insulin dosage. Making good choices helps to control your diabetes and prevent other health problems. A healthy meal plan includes eating lean proteins, complex carbohydrates, fresh fruits and vegetables, low-fat dairy products, and healthy fats. Make an appointment to see a diet and nutrition specialist (registered dietitian) to help you create an eating plan that is right for you. Make sure that you:  Follow instructions from your health care provider about eating or drinking restrictions.  Drink enough fluid to keep your urine pale yellow.  Keep a record of the carbohydrates that you eat. Do this by reading food labels and learning the standard serving sizes of foods.  Follow your sick day plan whenever you cannot eat or drink as usual. Make this plan in advance with your health care provider.  Stay active Exercise regularly, as told by your health care provider. This may include:  Stretching and doing strength exercises, such as yoga or weightlifting, 2 or more times a week.  Doing 150 minutes or more of moderate-intensity or vigorous-intensity exercise each week. This could be brisk walking, biking, or water aerobics. ? Spread out your activity over 3 or more days of the week. ? Do not go more than 2 days in a row without doing some kind of physical activity. When you start a new exercise or activity, work with your health care provider to adjust your insulin, medicines, or food intake as needed. Make healthy lifestyle choices  Do not use any tobacco products, such as cigarettes, chewing  tobacco, and e-cigarettes. If you need help quitting, ask your health care provider.  If your health care provider says that alcohol is safe for you, limit alcohol intake to no more than 1 drink per day for nonpregnant women and 2 drinks per day for men. One drink equals 12 oz of beer (355 mL), 5 oz  of wine (148 mL), or 1 oz of hard liquor (44 mL).  Learn to manage stress. If you need help with this, ask your health care provider. Care for your body   Keep your immunizations up to date. In addition to getting vaccinations as told by your health care provider, it is recommended that you get vaccinated against the following illnesses: ? The flu (influenza). Get a flu shot every year. ? Pneumonia. ? Hepatitis B.  Schedule an eye exam soon after your diagnosis, and then one time every year after that.  Check your skin and feet every day for cuts, bruises, redness, blisters, or sores. Schedule a foot exam with your health care provider once every year.  Brush your teeth and gums two times a day, and floss one or more times a day. Visit your dentist one or more times every 6 months.  Maintain a healthy weight. General instructions  Take over-the-counter and prescription medicines only as told by your health care provider.  Share your diabetes management plan with people in your workplace, school, and household.  Carry a medical alert card or wear medical alert jewelry.  Keep all follow-up visits as told by your health care provider. This is important. Questions to ask your health care provider  Do I need to meet with a diabetes educator?  Where can I find a support group for people with diabetes? Where to find more information For more information about diabetes, visit:  American Diabetes Association (ADA): www.diabetes.org  American Association of Diabetes Educators (AADE): www.diabeteseducator.org Summary  Caring for yourself after you have been diagnosed with (type 2 diabetes mellitus) means keeping your blood sugar (glucose) under control with a balance of nutrition, exercise, lifestyle changes, and medicine.  Check your blood glucose every day, as often as told by your health care provider.  Having diabetes can put you at risk for other long-term (chronic) conditions,  such as heart disease and kidney disease. Your health care provider may prescribe medicines to help prevent complications from diabetes.  Keep all follow-up visits as told by your health care provider. This is important. This information is not intended to replace advice given to you by your health care provider. Make sure you discuss any questions you have with your health care provider. Document Revised: 07/06/2017 Document Reviewed: 02/16/2015 Elsevier Patient Education  [employer].   If you have lab work done today you will be contacted with your lab results within the next 2 weeks.  If you have not heard from [place] then please contact us. The fastest way to get your results is to register for My Chart.   IF you received an x-ray today, you will receive an invoice from [facility]. Please contact [facility] at [phone] with questions or concerns regarding your invoice.   IF you received labwork today, you will receive an invoice from [place]. Please contact [facility] at [phone] with questions or concerns regarding your invoice.   Our billing staff will not be able to assist you with questions regarding bills from these companies.  You will be contacted with the lab results as soon as they are available.  The fastest way to get your results is to activate your My Chart account. Instructions are located on the last page of this paperwork. If you have not heard from [place] regarding the results in 2 weeks, please contact this office.         Signed, [name], MD [facility]

## 2019-10-20 NOTE — Patient Instructions (Addendum)
Advise your eye specialist you have diabetes - he should send me a report for retinopathy screening at your next appointment if possible.  If any diarrhea with metformin, stop that med and let me know.   2 month recheck - fasting labs at that time. Let me know if there are questions.    Type 2 Diabetes Mellitus, Self Care, Adult Caring for yourself after you have been diagnosed with type 2 diabetes (type 2 diabetes mellitus) means keeping your blood sugar (glucose) under control with a balance of:  Nutrition.  Exercise.  Lifestyle changes.  Medicines or insulin, if necessary.  Support from your team of health care providers and others. The following information explains what you need to know to manage your diabetes at home. What are the risks? Having diabetes can put you at risk for other long-term (chronic) conditions, such as heart disease and kidney disease. Your health care provider may prescribe medicines to help prevent complications from diabetes. These medicines may include:  Aspirin.  Medicine to lower cholesterol.  Medicine to control blood pressure. How to monitor blood glucose   Check your blood glucose every day, as often as told by your health care provider.  Have your A1c (hemoglobin A1c) level checked two or more times a year, or as often as told by your health care provider. Your health care provider will set individualized treatment goals for you. Generally, the goal of treatment is to maintain the following blood glucose levels:  Before meals (preprandial): 80-130 mg/dL (4.4-7.2 mmol/L).  After meals (postprandial): below 180 mg/dL (10 mmol/L).  A1c level: less than 7%. How to manage hyperglycemia and hypoglycemia Hyperglycemia symptoms Hyperglycemia, also called high blood glucose, occurs when blood glucose is too high. Make sure you know the early signs of hyperglycemia, such as:  Increased thirst.  Hunger.  Feeling very tired.  Needing to  urinate more often than usual.  Blurry vision. Hypoglycemia symptoms Hypoglycemia, also called low blood glucose, occurswith a blood glucose level at or below 70 mg/dL (3.9 mmol/L). The risk for hypoglycemia increases during or after exercise, during sleep, during illness, and when skipping meals or not eating for a long time (fasting). It is important to know the symptoms of hypoglycemia and treat it right away. Always have a 15-gram rapid-acting carbohydrate snack with you to treat low blood glucose. Family members and close friends should also know the symptoms and should understand how to treat hypoglycemia, in case you are not able to treat yourself. Symptoms may include:  Hunger.  Anxiety.  Sweating and feeling clammy.  Confusion.  Dizziness or feeling light-headed.  Sleepiness.  Nausea.  Increased heart rate.  Headache.  Blurry vision.  Irritability.  A change in coordination.  Tingling or numbness around the mouth, lips, or tongue.  Restless sleep.  Fainting.  Seizure. Treating hypoglycemia If you are alert and able to swallow safely, follow the 15:15 rule:  Take 15 grams of a rapid-acting carbohydrate. Talk with your health care provider about how much you should take.  Rapid-acting options include: ? Glucose pills (take 15 grams). ? 6-8 pieces of hard candy. ? 4-6 oz (120-150 mL) of fruit juice. ? 4-6 oz (120-150 mL) of regular (not diet) soda. ? 1 Tbsp (15 mL) honey or sugar.  Check your blood glucose 15 minutes after you take the carbohydrate.  If the repeat blood glucose level is still at or below 70 mg/dL (3.9 mmol/L), take 15 grams of a carbohydrate again.  If your  blood glucose level does not increase above 70 mg/dL (3.9 mmol/L) after 3 tries, seek emergency medical care.  After your blood glucose level returns to normal, eat a meal or a snack within 1 hour. Treating severe hypoglycemia Severe hypoglycemia is when your blood glucose level is  at or below 54 mg/dL (3 mmol/L). Severe hypoglycemia is an emergency. Do not wait to see if the symptoms will go away. Get medical help right away. Call your local emergency services (911 in the [place] If you have severe hypoglycemia and you cannot eat or drink, you may need an injection of glucagon. A family member or close friend should learn how to check your blood glucose and how to give you a glucagon injection. Ask your health care provider if you need to have an emergency glucagon injection kit available. Severe hypoglycemia may need to be treated in a hospital. The treatment may include getting glucose through an IV. You may also need treatment for the cause of your hypoglycemia. Follow these instructions at home: Take diabetes medicines as told  If your health care provider prescribed insulin or diabetes medicines, take them every day.  Do not run out of insulin or other diabetes medicines that you take. Plan ahead so you always have these available.  If you use insulin, adjust your dosage based on how physically active you are and what foods you eat. Your health care provider will tell you how to adjust your dosage. Make healthy food choices  The things that you eat and drink affect your blood glucose and your insulin dosage. Making good choices helps to control your diabetes and prevent other health problems. A healthy meal plan includes eating lean proteins, complex carbohydrates, fresh fruits and vegetables, low-fat dairy products, and healthy fats. Make an appointment to see a diet and nutrition specialist (registered dietitian) to help you create an eating plan that is right for you. Make sure that you:  Follow instructions from your health care provider about eating or drinking restrictions.  Drink enough fluid to keep your urine pale yellow.  Keep a record of the carbohydrates that you eat. Do this by reading food labels and learning the standard serving sizes of foods.  Follow  your sick day plan whenever you cannot eat or drink as usual. Make this plan in advance with your health care provider.  Stay active Exercise regularly, as told by your health care provider. This may include:  Stretching and doing strength exercises, such as yoga or weightlifting, 2 or more times a week.  Doing 150 minutes or more of moderate-intensity or vigorous-intensity exercise each week. This could be brisk walking, biking, or water aerobics. ? Spread out your activity over 3 or more days of the week. ? Do not go more than 2 days in a row without doing some kind of physical activity. When you start a new exercise or activity, work with your health care provider to adjust your insulin, medicines, or food intake as needed. Make healthy lifestyle choices  Do not use any tobacco products, such as cigarettes, chewing tobacco, and e-cigarettes. If you need help quitting, ask your health care provider.  If your health care provider says that alcohol is safe for you, limit alcohol intake to no more than 1 drink per day for nonpregnant women and 2 drinks per day for men. One drink equals 12 oz of beer (355 mL), 5 oz of wine (148 mL), or 1 oz of hard liquor (44 mL).  Learn  to manage stress. If you need help with this, ask your health care provider. Care for your body   Keep your immunizations up to date. In addition to getting vaccinations as told by your health care provider, it is recommended that you get vaccinated against the following illnesses: ? The flu (influenza). Get a flu shot every year. ? Pneumonia. ? Hepatitis B.  Schedule an eye exam soon after your diagnosis, and then one time every year after that.  Check your skin and feet every day for cuts, bruises, redness, blisters, or sores. Schedule a foot exam with your health care provider once every year.  Brush your teeth and gums two times a day, and floss one or more times a day. Visit your dentist one or more times every 6  months.  Maintain a healthy weight. General instructions  Take over-the-counter and prescription medicines only as told by your health care provider.  Share your diabetes management plan with people in your workplace, school, and household.  Carry a medical alert card or wear medical alert jewelry.  Keep all follow-up visits as told by your health care provider. This is important. Questions to ask your health care provider  Do I need to meet with a diabetes educator?  Where can I find a support group for people with diabetes? Where to find more information For more information about diabetes, visit:  American Diabetes Association (ADA): www.diabetes.org  American Association of Diabetes Educators (AADE): www.diabeteseducator.org Summary  Caring for yourself after you have been diagnosed with (type 2 diabetes mellitus) means keeping your blood sugar (glucose) under control with a balance of nutrition, exercise, lifestyle changes, and medicine.  Check your blood glucose every day, as often as told by your health care provider.  Having diabetes can put you at risk for other long-term (chronic) conditions, such as heart disease and kidney disease. Your health care provider may prescribe medicines to help prevent complications from diabetes.  Keep all follow-up visits as told by your health care provider. This is important. This information is not intended to replace advice given to you by your health care provider. Make sure you discuss any questions you have with your health care provider. Document Revised: 07/06/2017 Document Reviewed: 02/16/2015 Elsevier Patient Education  [employer].   If you have lab work done today you will be contacted with your lab results within the next 2 weeks.  If you have not heard from [place] then please contact us. The fastest way to get your results is to register for My Chart.   IF you received an x-ray today, you will receive an invoice from  [facility]. Please contact [facility] at [phone] with questions or concerns regarding your invoice.   IF you received labwork today, you will receive an invoice from [place]. Please contact [facility] at [phone] with questions or concerns regarding your invoice.   Our billing staff will not be able to assist you with questions regarding bills from these companies.  You will be contacted with the lab results as soon as they are available. The fastest way to get your results is to activate your My Chart account. Instructions are located on the last page of this paperwork. If you have not heard from [place] regarding the results in 2 weeks, please contact this office.

## 2019-10-21 ENCOUNTER — Encounter: Payer: Self-pay | Admitting: Family Medicine

## 2019-10-21 LAB — MICROALBUMIN / CREATININE URINE RATIO
Creatinine, Urine: 86.9 mg/dL
Microalb/Creat Ratio: <3 mg/g creat (ref 0–29)
Microalbumin, Urine: <3 ug/mL

## 2019-10-25 DIAGNOSIS — R69 Illness, unspecified: Secondary | ICD-10-CM | POA: Diagnosis not present

## 2019-12-13 ENCOUNTER — Other Ambulatory Visit: Payer: Self-pay | Admitting: Family Medicine

## 2019-12-13 DIAGNOSIS — R69 Illness, unspecified: Secondary | ICD-10-CM | POA: Diagnosis not present

## 2019-12-21 ENCOUNTER — Encounter: Payer: Self-pay | Admitting: Family Medicine

## 2019-12-21 ENCOUNTER — Other Ambulatory Visit: Payer: Self-pay

## 2019-12-21 ENCOUNTER — Ambulatory Visit (INDEPENDENT_AMBULATORY_CARE_PROVIDER_SITE_OTHER): Payer: Medicare HMO | Admitting: Family Medicine

## 2019-12-21 VITALS — BP 119/78 | HR 60 | Temp 99.1°F | Ht 68.0 in | Wt 182.0 lb

## 2019-12-21 DIAGNOSIS — E1165 Type 2 diabetes mellitus with hyperglycemia: Secondary | ICD-10-CM | POA: Diagnosis not present

## 2019-12-21 DIAGNOSIS — E785 Hyperlipidemia, unspecified: Secondary | ICD-10-CM

## 2019-12-21 DIAGNOSIS — R197 Diarrhea, unspecified: Secondary | ICD-10-CM

## 2019-12-21 DIAGNOSIS — R351 Nocturia: Secondary | ICD-10-CM | POA: Insufficient documentation

## 2019-12-21 LAB — COMPREHENSIVE METABOLIC PANEL
ALT: 53 IU/L — ABNORMAL HIGH (ref 0–44)
AST: 29 IU/L (ref 0–40)
Albumin/Globulin Ratio: 2.7 — ABNORMAL HIGH (ref 1.2–2.2)
Albumin: 4.6 g/dL (ref 3.7–4.7)
Alkaline Phosphatase: 69 IU/L (ref 44–121)
BUN/Creatinine Ratio: 14 (ref 10–24)
BUN: 12 mg/dL (ref 8–27)
Bilirubin Total: 0.7 mg/dL (ref 0.0–1.2)
CO2: 25 mmol/L (ref 20–29)
Calcium: 9.1 mg/dL (ref 8.6–10.2)
Chloride: 105 mmol/L (ref 96–106)
Creatinine, Ser: 0.86 mg/dL (ref 0.76–1.27)
GFR calc Af Amer: 99 mL/min/{1.73_m2} (ref >59)
GFR calc non Af Amer: 86 mL/min/{1.73_m2} (ref >59)
Globulin, Total: 1.7 g/dL (ref 1.5–4.5)
Glucose: 137 mg/dL — ABNORMAL HIGH (ref 65–99)
Potassium: 4.7 mmol/L (ref 3.5–5.2)
Sodium: 142 mmol/L (ref 134–144)
Total Protein: 6.3 g/dL (ref 6.0–8.5)

## 2019-12-21 LAB — LIPID PANEL
Chol/HDL Ratio: 2.8 ratio (ref 0.0–5.0)
Cholesterol, Total: 126 mg/dL (ref 100–199)
HDL: 45 mg/dL (ref >39)
LDL Chol Calc (NIH): 64 mg/dL (ref 0–99)
Triglycerides: 88 mg/dL (ref 0–149)
VLDL Cholesterol Cal: 17 mg/dL (ref 5–40)

## 2019-12-21 LAB — HEMOGLOBIN A1C
Est. average glucose Bld gHb Est-mCnc: 154 mg/dL
Hgb A1c MFr Bld: 7 % — ABNORMAL HIGH (ref 4.8–5.6)

## 2019-12-21 MED ORDER — METFORMIN HCL 500 MG PO TABS: 500.0000 mg | ORAL_TABLET | Freq: Every day | ORAL | 1 refills | Status: DC

## 2019-12-21 NOTE — Progress Notes (Signed)
Subjective:  Patient ID: Nathan Mora, male    DOB: July 14, 1945  Age: 74 y.o. MRN: [id number]  CC:  Chief Complaint  Patient presents with   Follow-up    on diabetes. Pt reports no issues with BS since last OV. pt states his home BS readings have been running around 120-150 mg/dL fasting. Pt states he does his best to watch his sugar intake. pt states he could do better with this, but he loves ice cream.    HPI [place] presents for   Diabetes: With hyperglycemia, A1c at diabetes level in March.  Increase A1c in August, started on Metformin 549m qd in September with cautions on diarrhea/potential side effects.  Previously had diarrhea, held on Metformin initially.  Did see GI in August.  Home readings, fasting 120-150.  Has been watching his diet, still some ice cream. Tolerating metformin, but did have some return of diarrhea.had been doing well for months.  Morning diarrhea started about 2 weeks ago. Not when starting med. Diarrhea most days, but not daily. In am - not at night. Not as bad as in past. Metallic smell. Does use artificial sweetener, diarrhea/GI intolerance with dark roast. . Plans follow up with GI (had cancelled f/u appt as improved prior). Microalbumin: Normal ratio 10/20/2019 Optho, foot exam, pneumovax: Plan to discuss diabetes with his next eye appointment.  Status post Prevnar and Pneumovax. He is on statin.lipitor 1/2 pill per day.  Coffee this am, otherwise fasting.  Green banana per day helped diarrhea prior.   Lab Results  Component Value Date   HGBA1C 7.3 (H) 09/02/2019   HGBA1C 6.9 (H) 04/08/2019   HGBA1C 6.0 (H) 10/14/2018   Lab Results  Component Value Date   LDLCALC 68 04/08/2019   CREATININE 0.84 09/02/2019      History Patient Active Problem List   Diagnosis Date Noted   Excessive urination at night 12/21/2019   Chronic diarrhea 03/31/2019   Dermatochalasis 11/29/2012   Primary open angle glaucoma of both eyes 11/29/2012   Family  history of early CAD 04/23/2012   Hyperlipidemia    Glaucoma    Lipid disorder 03/29/2012   Past Medical History:  Diagnosis Date   Glaucoma    left eye   Hyperlipidemia    Lipid disorder 03/29/2012   No past surgical history on file. No Known Allergies Prior to Admission medications   Medication Sig Start Date End Date Taking? Authorizing Provider  atorvastatin (LIPITOR) 40 MG tablet Take 0.5 tablets (20 mg total) by mouth daily at 6 PM. 10/20/19   GWendie Agreste MD  blood glucose meter kit and supplies Test once per day - fasting or 2 hours after meal. Dispense based on patient and insurance preference. 09/15/19   GWendie Agreste MD  brimonidine-timolol (COMBIGAN) 0.2-0.5 % ophthalmic solution Place 1 drop into both eyes every 12 (twelve) hours.    [provider]  doxazosin (CARDURA) 2 MG tablet Take 1 tablet (2 mg total) by mouth daily. 10/20/19   GWendie Agreste MD  Lancets (ONETOUCH DELICA PLUS [id number] MByronApply topically as directed. 09/17/19   [provider]  latanoprost (XALATAN) 0.005 % ophthalmic solution 1 drop as directed.    [provider]  metFORMIN (GLUCOPHAGE) 500 MG tablet Take 1 tablet (500 mg total) by mouth daily with breakfast. 10/20/19   GWendie Agreste MD  ON[facility] Medical GroupULTRA test strip TEST EVERY DAY FASTING OR 2 HOURS AFTER A MEAL 12/13/19   GWendie Agreste  MD  sildenafil (REVATIO) 20 MG tablet 1 to 3 tabs up to QD prior to onset of sexual activity. 04/08/19   [name], MD   Social History   Socioeconomic History   Marital status: Married    Spouse name: Not on file   Number of children: Not on file   Years of education: Not on file   Highest education level: Not on file  Occupational History   Not on file  Tobacco Use   Smoking status: Never Smoker   Smokeless tobacco: Never Used  Substance and Sexual Activity   Alcohol use: Yes    Alcohol/week: 4.0 standard drinks    Types: 4 Cans of beer  per week   Drug use: No   Sexual activity: Yes  Other Topics Concern   Not on file  Social History Narrative   Not on file   Social Determinants of Health   Financial Resource Strain:    Difficulty of Paying Living Expenses: Not on file  Food Insecurity:    Worried About [place] in the Last Year: Not on file   Ran Out of Food in the Last Year: Not on file  Transportation Needs:    Lack of Transportation (Medical): Not on file   Lack of Transportation (Non-Medical): Not on file  Physical Activity:    Days of Exercise per Week: Not on file   Minutes of Exercise per Session: Not on file  Stress:    Feeling of Stress : Not on file  Social Connections:    Frequency of Communication with Friends and Family: Not on file   Frequency of Social Gatherings with Friends and Family: Not on file   Attends Religious Services: Not on file   Active Member of Clubs or Organizations: Not on file   Attends Archivist Meetings: Not on file   Marital Status: Not on file  Intimate Partner Violence:    Fear of Current or Ex-Partner: Not on file   Emotionally Abused: Not on file   Physically Abused: Not on file   Sexually Abused: Not on file    Review of Systems  Constitutional: Negative for fatigue, fever and unexpected weight change.  Eyes: Negative for visual disturbance.  Respiratory: Negative for cough, chest tightness and shortness of breath.   Cardiovascular: Negative for chest pain, palpitations and leg swelling.  Gastrointestinal: Positive for diarrhea. Negative for abdominal pain, blood in stool, nausea and vomiting.  Neurological: Negative for dizziness, light-headedness and headaches.     Objective:   Vitals:   12/21/19 0944  BP: 119/78  Pulse: 60  Temp: 99.1 F (37.3 C)  TempSrc: Temporal  SpO2: 99%  Weight: 182 lb (82.6 kg)  Height: '5\' 8"'  (1.727 m)     Physical Exam Vitals reviewed.  Constitutional:      Appearance: He  is well-developed.  HENT:     Head: Normocephalic and atraumatic.  Eyes:     Pupils: Pupils are equal, round, and reactive to light.  Neck:     Vascular: No carotid bruit or JVD.  Cardiovascular:     Rate and Rhythm: Normal rate and regular rhythm.     Heart sounds: Normal heart sounds. No murmur heard.   Pulmonary:     Effort: Pulmonary effort is normal.     Breath sounds: Normal breath sounds. No rales.  Abdominal:     General: Abdomen is flat.  Skin:    General: Skin is warm and dry.  Neurological:     Mental Status: He is alert and oriented to person, place, and time.     Assessment & Plan:  Jayland Null is a 74 y.o. male . Type 2 diabetes mellitus with hyperglycemia, without long-term current use of insulin (HCC) - Plan: Hemoglobin A1c, metFORMIN (GLUCOPHAGE) 500 MG tablet  -Check A1c, suspect his diarrhea is for other causes but has option to stop Metformin temporarily. If unrelated, then continue Metformin 500 mg daily.  Diarrhea, unspecified type - Plan: Comprehensive metabolic panel  -Recurrence, but not as significant as previous episodes. Less likely Metformin due to timing. Could be related to artificial sweeteners. Recommend trial of probiotic as that apparently had been discussion with gastroenterology, but if that is not helping can temporarily stop Metformin, change from artificial sweeteners, and follow-up with gastroenterology if not improving. Sooner if worse.  Hyperlipidemia, unspecified hyperlipidemia type - Plan: Comprehensive metabolic panel, Lipid panel  Tolerating statin, continue same.  Meds ordered this encounter  Medications   metFORMIN (GLUCOPHAGE) 500 MG tablet    Sig: Take 1 tablet (500 mg total) by mouth daily with breakfast.    Dispense:  90 tablet    Refill:  1   Patient Instructions    Can try stopping metformin for a week to see if that is cause of diarrhea, but less likely. Artificial sweetener may also be contributor. I do recommend  follow up with Dr. Cristina Gong, but can also try otc probiotics for now to see if that will help.   No other med changes for now. I will check some labs.     If you have lab work done today you will be contacted with your lab results within the next 2 weeks.  If you have not heard from [place] then please contact us. The fastest way to get your results is to register for My Chart.   IF you received an x-ray today, you will receive an invoice from [facility]. Please contact [facility] Radiology at [phone] with questions or concerns regarding your invoice.   IF you received labwork today, you will receive an invoice from [place]. Please contact [facility] at [phone] with questions or concerns regarding your invoice.   Our billing staff will not be able to assist you with questions regarding bills from these companies.  You will be contacted with the lab results as soon as they are available. The fastest way to get your results is to activate your My Chart account. Instructions are located on the last page of this paperwork. If you have not heard from [place] regarding the results in 2 weeks, please contact this office.         Signed, [name], MD [facility]

## 2019-12-21 NOTE — Patient Instructions (Addendum)
°  Can try stopping metformin for a week to see if that is cause of diarrhea, but less likely. Artificial sweetener may also be contributor. I do recommend follow up with Dr. Cristina Gong, but can also try otc probiotics for now to see if that will help.   No other med changes for now. I will check some labs.     If you have lab work done today you will be contacted with your lab results within the next 2 weeks.  If you have not heard from [place] then please contact us. The fastest way to get your results is to register for My Chart.   IF you received an x-ray today, you will receive an invoice from [facility]. Please contact [facility] at [phone] with questions or concerns regarding your invoice.   IF you received labwork today, you will receive an invoice from [place]. Please contact [facility] at [phone] with questions or concerns regarding your invoice.   Our billing staff will not be able to assist you with questions regarding bills from these companies.  You will be contacted with the lab results as soon as they are available. The fastest way to get your results is to activate your My Chart account. Instructions are located on the last page of this paperwork. If you have not heard from [place] regarding the results in 2 weeks, please contact this office.

## 2019-12-26 DIAGNOSIS — D225 Melanocytic nevi of trunk: Secondary | ICD-10-CM | POA: Diagnosis not present

## 2019-12-26 DIAGNOSIS — L570 Actinic keratosis: Secondary | ICD-10-CM | POA: Diagnosis not present

## 2019-12-26 DIAGNOSIS — D1801 Hemangioma of skin and subcutaneous tissue: Secondary | ICD-10-CM | POA: Diagnosis not present

## 2019-12-26 DIAGNOSIS — L821 Other seborrheic keratosis: Secondary | ICD-10-CM | POA: Diagnosis not present

## 2019-12-26 DIAGNOSIS — Z85828 Personal history of other malignant neoplasm of skin: Secondary | ICD-10-CM | POA: Diagnosis not present

## 2019-12-26 DIAGNOSIS — B354 Tinea corporis: Secondary | ICD-10-CM | POA: Diagnosis not present

## 2020-01-12 DIAGNOSIS — H2513 Age-related nuclear cataract, bilateral: Secondary | ICD-10-CM | POA: Diagnosis not present

## 2020-01-12 DIAGNOSIS — H401123 Primary open-angle glaucoma, left eye, severe stage: Secondary | ICD-10-CM | POA: Diagnosis not present

## 2020-01-12 DIAGNOSIS — H401111 Primary open-angle glaucoma, right eye, mild stage: Secondary | ICD-10-CM | POA: Diagnosis not present

## 2020-01-24 ENCOUNTER — Ambulatory Visit: Payer: Self-pay

## 2020-01-31 DIAGNOSIS — B354 Tinea corporis: Secondary | ICD-10-CM | POA: Diagnosis not present

## 2020-01-31 DIAGNOSIS — Z85828 Personal history of other malignant neoplasm of skin: Secondary | ICD-10-CM | POA: Diagnosis not present

## 2020-03-13 ENCOUNTER — Encounter: Payer: Self-pay | Admitting: Family Medicine

## 2020-03-23 ENCOUNTER — Telehealth (INDEPENDENT_AMBULATORY_CARE_PROVIDER_SITE_OTHER): Payer: Medicare HMO | Admitting: Family Medicine

## 2020-03-23 ENCOUNTER — Encounter: Payer: Self-pay | Admitting: Family Medicine

## 2020-03-23 ENCOUNTER — Other Ambulatory Visit: Payer: Self-pay

## 2020-03-23 VITALS — Ht 68.0 in | Wt 178.0 lb

## 2020-03-23 DIAGNOSIS — R42 Dizziness and giddiness: Secondary | ICD-10-CM

## 2020-03-23 DIAGNOSIS — R197 Diarrhea, unspecified: Secondary | ICD-10-CM

## 2020-03-23 NOTE — Patient Instructions (Addendum)
   Stop artificial sweetener. If diarrhea not improved in few days, stop metformin temporarily. If diarrhea is still not improving off the metformin, then I want you to meet with Dr. Cristina Gong.  Continue to drink plenty of fluids. Check your blood pressure if dizzy. If blood pressure running low - stop doxazosin temporarily.  If dizziness is not improving with less diarrhea - be seen.  If needed, can take immodium once per day to slow down diarrhea.  Return to the clinic or go to the nearest emergency room if any of your symptoms worsen or new symptoms occur.  Let me know how things are going in the next week.    If you have lab work done today you will be contacted with your lab results within the next 2 weeks.  If you have not heard from [place] then please contact us. The fastest way to get your results is to register for My Chart.   IF you received an x-ray today, you will receive an invoice from [facility]. Please contact [facility] Radiology at [phone] with questions or concerns regarding your invoice.   IF you received labwork today, you will receive an invoice from [place]. Please contact [facility] at [phone] with questions or concerns regarding your invoice.   Our billing staff will not be able to assist you with questions regarding bills from these companies.  You will be contacted with the lab results as soon as they are available. The fastest way to get your results is to activate your My Chart account. Instructions are located on the last page of this paperwork. If you have not heard from [place] regarding the results in 2 weeks, please contact this office.

## 2020-03-23 NOTE — Progress Notes (Signed)
Virtual Visit via Telephone Note  I called Nathan Mora on 03/23/20 at 6:47 PM - no answer - left message that I would call back.  7:12 PM repeat all placed. No answer - left message that I will try to reach once more.  7:33 PM repeat call by telephone, connected and verified that I am speaking with the correct person using two identifiers. Patient location: in car  My location: office.   I discussed the limitations, risks, security and privacy concerns of performing an evaluation and management service by telephone and the availability of in person appointments. I also discussed with the patient that there may be a patient responsible charge related to this service. The patient expressed understanding and agreed to proceed, consent obtained  Chief complaint:  Chief Complaint  Patient presents with  . Diarrhea    A year off and on.   . Dizziness    Some morning within the hour of waking up in the AM. Hazzy going on the last couple months.    History of Present Illness: Nathan Mora is a 75 y.o. male  Diarrhea See previous visits.  Recurrent diarrhea.  Has been evaluated by gastroenterology last year.  Has tried to hold Metformin as potential cause, diarrhea has recurred off Metformin.  Last discussed in November, was using artificial sweetener at that time.  Planned to follow-up with gastroenterology.  Option to stop Metformin again, over-the-counter probiotics. mychart message 2/15- return of diarrhea. In [place] since 02/28/20. Coming back to [place] next week.  Diarrhea off and on since first part of January, then more consistent past month - 2-3 times per day. No apparent blood.  No n/v/abd pain. No weight loss, night sweats.  Still taking metformin - has not tried to stop recently.  Probiotic 4 days per week.  Still using artificial sweetener. Wakes up few times in am at times, but just at 6:30am past few days.  Coffee 2 cups per day.  Min spicy foods.  No sildenafil recently.    Dizziness  Noted in am only. Slight weak feeling, but diarrhea in am. Feels better after eating breakfast and drinking water. Not checking  No syncope/near syncope. No chest pains, no focal weakness, no focal weakness.   Lab Results  Component Value Date   WBC 6.1 09/02/2019   HGB 14.4 09/02/2019   HCT 43.3 (A) 09/02/2019   MCV 92.2 09/02/2019   PLT 158 11/13/2016   Lab Results  Component Value Date   HGBA1C 7.0 (H) 12/21/2019   Lab Results  Component Value Date   NA 142 12/21/2019   K 4.7 12/21/2019   CL 105 12/21/2019   CO2 25 12/21/2019      Patient Active Problem List   Diagnosis Date Noted  . Excessive urination at night 12/21/2019  . Chronic diarrhea 03/31/2019  . Dermatochalasis 11/29/2012  . Primary open angle glaucoma of both eyes 11/29/2012  . Family history of early CAD 04/23/2012  . Hyperlipidemia   . Glaucoma   . Lipid disorder 03/29/2012   Past Medical History:  Diagnosis Date  . Glaucoma    left eye  . Hyperlipidemia   . Lipid disorder 03/29/2012   No past surgical history on file. No Known Allergies Prior to Admission medications   Medication Sig Start Date End Date Taking? Authorizing Provider  atorvastatin (LIPITOR) 40 MG tablet Take 0.5 tablets (20 mg total) by mouth daily at 6 PM. 10/20/19  Yes [name], MD  blood glucose  meter kit and supplies Test once per day - fasting or 2 hours after meal. Dispense based on patient and insurance preference. 09/15/19  Yes [name], MD  brimonidine-timolol (COMBIGAN) 0.2-0.5 % ophthalmic solution Place 1 drop into both eyes every 12 (twelve) hours.   Yes [provider]  doxazosin (CARDURA) 2 MG tablet Take 1 tablet (2 mg total) by mouth daily. 10/20/19  Yes [name], MD  Lancets (ONETOUCH DELICA PLUS [id number]) [place] Apply topically as directed. 09/17/19  Yes [provider]  latanoprost (XALATAN) 0.005 % ophthalmic solution 1 drop as directed.   Yes [provider]  metFORMIN (GLUCOPHAGE) 500 MG tablet Take 1 tablet (500 mg total) by mouth daily with breakfast. 12/21/19  Yes [name], MD  [facility] ULTRA test strip TEST EVERY DAY FASTING OR 2 HOURS AFTER A MEAL 12/13/19  Yes [name], MD  sildenafil (REVATIO) 20 MG tablet 1 to 3 tabs up to QD prior to onset of sexual activity. 04/08/19  Yes [name], MD   Social History   Socioeconomic History  . Marital status: Married    Spouse name: Not on file  . Number of children: Not on file  . Years of education: Not on file  . Highest education level: Not on file  Occupational History  . Not on file  Tobacco Use  . Smoking status: Never Smoker  . Smokeless tobacco: Never Used  Substance and Sexual Activity  . Alcohol use: Yes    Alcohol/week: 4.0 standard drinks    Types: 4 Cans of beer per week  . Drug use: No  . Sexual activity: Yes  Other Topics Concern  . Not on file  Social History Narrative  . Not on file   Social Determinants of Health   Financial Resource Strain: Not on file  Food Insecurity: Not on file  Transportation Needs: Not on file  Physical Activity: Not on file  Stress: Not on file  Social Connections: Not on file  Intimate Partner Violence: Not on file     Observations/Objective: Vitals:   03/23/20 1543  Weight: 178 lb (80.7 kg)  Height: '5\' 8"'  (1.727 m)  No distress on phone, appropriate responses, all questions were answered with understanding expressed.  Euthymic mood.  Normal speech.   Assessment and Plan: Episodic lightheadedness  Diarrhea, unspecified type Recurrent diarrhea episodes, some nighttime symptoms but denies other red flag symptoms of fever, nausea, vomiting, abdominal pain, or weight loss.  Stop artificial sweeteners, decrease coffee intake by half, stop Metformin as well if not improving.  Recurrent diarrhea for some time, may still need to have follow-up with gastroenterology, advised to follow-up with GI  if above interventions are not helpful.  Short-term Imodium as option if needed.  Transient dizziness in the morning may be related to diarrhea. Maintain hydration, check blood pressures with episodic dizziness and if low will need to stop doxazosin.  Continue to avoid sildenafil for now.  If dizziness persists after cessation of diarrhea recommended in office evaluation.  RTC/ER precautions given.  Follow Up Instructions:    I discussed the assessment and treatment plan with the patient. The patient was provided an opportunity to ask questions and all were answered. The patient agreed with the plan and demonstrated an understanding of the instructions.   The patient was advised to call back or seek an in-person evaluation if the symptoms worsen or if the condition fails to improve as anticipated.  I provided  20 minutes of non-face-to-face time during this encounter.  Signed,   [name], MD [facility].  03/23/20

## 2020-03-29 ENCOUNTER — Telehealth: Payer: Self-pay | Admitting: Family Medicine

## 2020-03-29 NOTE — Telephone Encounter (Signed)
Patient last colonoscopy was 01/01/2016 she is not due till 12/31/2025 . Patient is requesting for another colonoscopy sooner or will we just need to refer her to [facility] and let them decide

## 2020-03-29 NOTE — Telephone Encounter (Signed)
03/29/2020 - PATIENT'S WIFE CALLED TO LET DR. GREENE KNOW HER HUSBAND IS HAVING PROBLEMS WITH BOWEL MOVEMENTS AND IS LOOSING WEIGHT. SHE WOULD LIKE HIM TO ORDER A COLONOSCOPY. SHE IS A NURSE AND SHE KNOWS THAT IS WHAT HE NEEDS. BEST PHONE [phone] MARY Michon ([place])  [place]

## 2020-03-30 NOTE — Telephone Encounter (Signed)
TeleMed visit February 25.  My understanding at  time was that there was no weight loss or night sweats.  He has had intermittent diarrhea for some time and we discussed some initial approaches but to follow-up with gastroenterology if not quickly improving.  He has been seen by GI previously, Dr. Cristina Gong.  Have there been new symptoms?  Has he been losing weight recently?  If diarrhea has not improved then definitely should call gastroenterology to see if he can be seen soon as I agree, colonoscopy may be next step.  Should definitely discuss this with gastroenterology.  Let me know if I can help further.

## 2020-04-01 ENCOUNTER — Encounter: Payer: Self-pay | Admitting: Family Medicine

## 2020-04-02 DIAGNOSIS — R197 Diarrhea, unspecified: Secondary | ICD-10-CM | POA: Diagnosis not present

## 2020-04-03 ENCOUNTER — Other Ambulatory Visit (HOSPITAL_COMMUNITY): Payer: Self-pay | Admitting: Physician Assistant

## 2020-04-03 ENCOUNTER — Other Ambulatory Visit: Payer: Self-pay | Admitting: Physician Assistant

## 2020-04-03 DIAGNOSIS — R748 Abnormal levels of other serum enzymes: Secondary | ICD-10-CM

## 2020-04-03 NOTE — Telephone Encounter (Signed)
Pt saw Dr Cristina Gong yesterday FYI had lab work done

## 2020-04-03 NOTE — Telephone Encounter (Signed)
Noted. Thanks.

## 2020-04-04 ENCOUNTER — Other Ambulatory Visit: Payer: Self-pay | Admitting: Physician Assistant

## 2020-04-04 ENCOUNTER — Encounter: Payer: Self-pay | Admitting: Family Medicine

## 2020-04-04 ENCOUNTER — Ambulatory Visit (HOSPITAL_BASED_OUTPATIENT_CLINIC_OR_DEPARTMENT_OTHER)
Admission: RE | Admit: 2020-04-04 | Discharge: 2020-04-04 | Disposition: A | Discharge status: Home / Self Care | Payer: Medicare HMO | Source: Ambulatory Visit | Attending: Physician Assistant | Admitting: Physician Assistant

## 2020-04-04 ENCOUNTER — Other Ambulatory Visit: Payer: Self-pay

## 2020-04-04 DIAGNOSIS — K8689 Other specified diseases of pancreas: Secondary | ICD-10-CM

## 2020-04-04 DIAGNOSIS — K828 Other specified diseases of gallbladder: Secondary | ICD-10-CM | POA: Diagnosis not present

## 2020-04-04 DIAGNOSIS — K838 Other specified diseases of biliary tract: Secondary | ICD-10-CM | POA: Insufficient documentation

## 2020-04-04 DIAGNOSIS — K869 Disease of pancreas, unspecified: Secondary | ICD-10-CM | POA: Diagnosis not present

## 2020-04-04 DIAGNOSIS — R748 Abnormal levels of other serum enzymes: Secondary | ICD-10-CM

## 2020-04-05 ENCOUNTER — Ambulatory Visit
Admission: RE | Admit: 2020-04-05 | Discharge: 2020-04-05 | Disposition: A | Discharge status: Home / Self Care | Payer: Medicare HMO | Source: Ambulatory Visit | Attending: Physician Assistant | Admitting: Physician Assistant

## 2020-04-05 DIAGNOSIS — K8689 Other specified diseases of pancreas: Secondary | ICD-10-CM | POA: Diagnosis not present

## 2020-04-05 DIAGNOSIS — R748 Abnormal levels of other serum enzymes: Secondary | ICD-10-CM | POA: Diagnosis not present

## 2020-04-05 DIAGNOSIS — K838 Other specified diseases of biliary tract: Secondary | ICD-10-CM | POA: Diagnosis not present

## 2020-04-05 DIAGNOSIS — K828 Other specified diseases of gallbladder: Secondary | ICD-10-CM | POA: Diagnosis not present

## 2020-04-05 MED ORDER — GADOBENATE DIMEGLUMINE 529 MG/ML IV SOLN
16.0000 mL | Freq: Once | INTRAVENOUS | Status: AC | PRN
  Administered 2020-04-05: 16 mL via INTRAVENOUS

## 2020-04-06 ENCOUNTER — Telehealth: Payer: Self-pay

## 2020-04-06 ENCOUNTER — Other Ambulatory Visit: Payer: Self-pay

## 2020-04-06 DIAGNOSIS — C259 Malignant neoplasm of pancreas, unspecified: Secondary | ICD-10-CM

## 2020-04-06 DIAGNOSIS — K831 Obstruction of bile duct: Secondary | ICD-10-CM

## 2020-04-06 NOTE — Telephone Encounter (Signed)
Per V.O. from Dr Rush Landmark the pt needs to have EUS ERCP on Wed 3/16 in the am at either hospital. Patient has pancreatic cancer.  Dr Paulita Fujita and peds GI have to release the time on Monday afternoon.  Pt also has obstructive jaundice with a bili of 9.  The pt needs to go to the ED if he develops fever or chills. No records from [facility] is needed Dr Rush Landmark has viewed all records.    Order has been entered will await confirmation from scheduling when and where the pt can be added.    I have tried to call pt and line rings busy. I have also placed a call to [facility] at [phone] and that line also rings busy.  Will try later today.

## 2020-04-06 NOTE — Telephone Encounter (Signed)
Pt reports abnormal findings and may be being admitted

## 2020-04-09 ENCOUNTER — Other Ambulatory Visit: Payer: Self-pay

## 2020-04-09 ENCOUNTER — Other Ambulatory Visit (HOSPITAL_COMMUNITY)
Admission: RE | Admit: 2020-04-09 | Discharge: 2020-04-09 | Disposition: A | Discharge status: Home / Self Care | Payer: Medicare HMO | Source: Ambulatory Visit | Attending: Gastroenterology | Admitting: Gastroenterology

## 2020-04-09 ENCOUNTER — Encounter (HOSPITAL_COMMUNITY): Payer: Self-pay | Admitting: Gastroenterology

## 2020-04-09 ENCOUNTER — Telehealth: Payer: Self-pay | Admitting: Gastroenterology

## 2020-04-09 DIAGNOSIS — Z01812 Encounter for preprocedural laboratory examination: Secondary | ICD-10-CM | POA: Insufficient documentation

## 2020-04-09 DIAGNOSIS — Z20822 Contact with and (suspected) exposure to covid-19: Secondary | ICD-10-CM | POA: Insufficient documentation

## 2020-04-09 NOTE — Telephone Encounter (Signed)
Patty, Dr. Rush Landmark said that you were aware of what was going on with this.

## 2020-04-09 NOTE — Telephone Encounter (Signed)
The pt has been scheduled and instructed for EUS ERCP on 04/11/20 at 830 am at [facility].  COVID test today at 12:10 pm.  The pt verbalized understanding of all the  Information.  He has also been sent all the information to My Chart.  He has been advised to call with any questions.

## 2020-04-09 NOTE — Telephone Encounter (Signed)
Inbound call from patient's wife requesting a call back please in regards to an appointment.

## 2020-04-09 NOTE — Telephone Encounter (Signed)
See alternate note 3/11

## 2020-04-10 LAB — SARS CORONAVIRUS 2 (TAT 6-24 HRS): SARS Coronavirus 2: NEGATIVE

## 2020-04-10 NOTE — Anesthesia Preprocedure Evaluation (Addendum)
Anesthesia Evaluation  Patient identified by MRN, date of birth, ID band Patient awake    Reviewed: Allergy & Precautions, H&P , NPO status , Patient's Chart, lab work & pertinent test results  Airway Mallampati: III  TM Distance: <3 FB Neck ROM: Full    Dental no notable dental hx. (+) Teeth Intact, Dental Advisory Given   Pulmonary neg pulmonary ROS,    Pulmonary exam normal breath sounds clear to auscultation       Cardiovascular Exercise Tolerance: Good negative cardio ROS Normal cardiovascular exam Rhythm:Regular Rate:Normal     Neuro/Psych negative neurological ROS  negative psych ROS   GI/Hepatic negative GI ROS, Neg liver ROS,   Endo/Other  diabetes, Oral Hypoglycemic Agents  Renal/GU negative Renal ROS  negative genitourinary   Musculoskeletal negative musculoskeletal ROS (+)   Abdominal   Peds negative pediatric ROS (+)  Hematology negative hematology ROS (+)   Anesthesia Other Findings   Reproductive/Obstetrics negative OB ROS                            Anesthesia Physical Anesthesia Plan  ASA: III  Anesthesia Plan: General   Post-op Pain Management:    Induction: Intravenous  PONV Risk Score and Plan: 2  Airway Management Planned: Oral ETT  Additional Equipment: None  Intra-op Plan:   Post-operative Plan: Extubation in OR  Informed Consent: I have reviewed the patients History and Physical, chart, labs and discussed the procedure including the risks, benefits and alternatives for the proposed anesthesia with the patient or authorized representative who has indicated his/her understanding and acceptance.       Plan Discussed with: Anesthesiologist  Anesthesia Plan Comments: (  )        Anesthesia Quick Evaluation

## 2020-04-11 ENCOUNTER — Ambulatory Visit (HOSPITAL_COMMUNITY): Payer: Medicare HMO

## 2020-04-11 ENCOUNTER — Ambulatory Visit (HOSPITAL_COMMUNITY)
Admission: RE | Admit: 2020-04-11 | Discharge: 2020-04-11 | Disposition: A | Discharge status: Home / Self Care | Payer: Medicare HMO | Attending: Gastroenterology | Admitting: Gastroenterology

## 2020-04-11 ENCOUNTER — Ambulatory Visit (HOSPITAL_COMMUNITY): Payer: Medicare HMO | Admitting: Anesthesiology

## 2020-04-11 ENCOUNTER — Ambulatory Visit (HOSPITAL_COMMUNITY)
Admission: RE | Disposition: A | Discharge status: Home / Self Care | Payer: Self-pay | Source: Home / Self Care | Attending: Gastroenterology

## 2020-04-11 ENCOUNTER — Encounter (HOSPITAL_COMMUNITY): Payer: Self-pay | Admitting: Gastroenterology

## 2020-04-11 ENCOUNTER — Other Ambulatory Visit: Payer: Self-pay

## 2020-04-11 DIAGNOSIS — K2980 Duodenitis without bleeding: Secondary | ICD-10-CM | POA: Diagnosis not present

## 2020-04-11 DIAGNOSIS — D490 Neoplasm of unspecified behavior of digestive system: Secondary | ICD-10-CM | POA: Diagnosis not present

## 2020-04-11 DIAGNOSIS — K2970 Gastritis, unspecified, without bleeding: Secondary | ICD-10-CM | POA: Insufficient documentation

## 2020-04-11 DIAGNOSIS — C259 Malignant neoplasm of pancreas, unspecified: Secondary | ICD-10-CM | POA: Diagnosis not present

## 2020-04-11 DIAGNOSIS — K838 Other specified diseases of biliary tract: Secondary | ICD-10-CM | POA: Diagnosis not present

## 2020-04-11 DIAGNOSIS — K317 Polyp of stomach and duodenum: Secondary | ICD-10-CM | POA: Insufficient documentation

## 2020-04-11 DIAGNOSIS — Z48815 Encounter for surgical aftercare following surgery on the digestive system: Secondary | ICD-10-CM | POA: Diagnosis not present

## 2020-04-11 DIAGNOSIS — K862 Cyst of pancreas: Secondary | ICD-10-CM | POA: Insufficient documentation

## 2020-04-11 DIAGNOSIS — K2289 Other specified disease of esophagus: Secondary | ICD-10-CM | POA: Diagnosis not present

## 2020-04-11 DIAGNOSIS — K831 Obstruction of bile duct: Secondary | ICD-10-CM | POA: Diagnosis not present

## 2020-04-11 HISTORY — PX: BILIARY BRUSHING: SHX6843

## 2020-04-11 HISTORY — PX: PANCREATIC STENT PLACEMENT: SHX5539

## 2020-04-11 HISTORY — PX: SPHINCTEROTOMY: SHX5544

## 2020-04-11 HISTORY — PX: EUS: SHX5427

## 2020-04-11 HISTORY — PX: FINE NEEDLE ASPIRATION: SHX5430

## 2020-04-11 HISTORY — PX: BIOPSY: SHX5522

## 2020-04-11 HISTORY — PX: ESOPHAGOGASTRODUODENOSCOPY (EGD) WITH PROPOFOL: SHX5813

## 2020-04-11 HISTORY — PX: ENDOSCOPIC RETROGRADE CHOLANGIOPANCREATOGRAPHY (ERCP) WITH PROPOFOL: SHX5810

## 2020-04-11 HISTORY — PX: BILIARY STENT PLACEMENT: SHX5538

## 2020-04-11 HISTORY — DX: Type 2 diabetes mellitus without complications: E11.9

## 2020-04-11 LAB — CBC
HCT: 44.7 % (ref 39.0–52.0)
Hemoglobin: 14.3 g/dL (ref 13.0–17.0)
MCH: 30.2 pg (ref 26.0–34.0)
MCHC: 32 g/dL (ref 30.0–36.0)
MCV: 94.3 fL (ref 80.0–100.0)
Platelets: 175 10*3/uL (ref 150–400)
RBC: 4.74 MIL/uL (ref 4.22–5.81)
RDW: 13.9 % (ref 11.5–15.5)
WBC: 6.8 10*3/uL (ref 4.0–10.5)
nRBC: 0 % (ref 0.0–0.2)

## 2020-04-11 LAB — COMPREHENSIVE METABOLIC PANEL
ALT: 705 U/L — ABNORMAL HIGH (ref 0–44)
AST: 526 U/L — ABNORMAL HIGH (ref 15–41)
Albumin: 3.2 g/dL — ABNORMAL LOW (ref 3.5–5.0)
Alkaline Phosphatase: 1940 U/L — ABNORMAL HIGH (ref 38–126)
Anion gap: 12 (ref 5–15)
BUN: 16 mg/dL (ref 8–23)
CO2: 20 mmol/L — ABNORMAL LOW (ref 22–32)
Calcium: 8.6 mg/dL — ABNORMAL LOW (ref 8.9–10.3)
Chloride: 98 mmol/L (ref 98–111)
Creatinine, Ser: 0.46 mg/dL — ABNORMAL LOW (ref 0.61–1.24)
GFR, Estimated: >60 mL/min (ref >60)
Glucose, Bld: 261 mg/dL — ABNORMAL HIGH (ref 70–99)
Potassium: 4.3 mmol/L (ref 3.5–5.1)
Sodium: 130 mmol/L — ABNORMAL LOW (ref 135–145)
Total Bilirubin: 15.9 mg/dL — ABNORMAL HIGH (ref 0.3–1.2)
Total Protein: 6 g/dL — ABNORMAL LOW (ref 6.5–8.1)

## 2020-04-11 LAB — GLUCOSE, CAPILLARY
Glucose-Capillary: 171 mg/dL — ABNORMAL HIGH (ref 70–99)
Glucose-Capillary: 263 mg/dL — ABNORMAL HIGH (ref 70–99)

## 2020-04-11 SURGERY — UPPER ENDOSCOPIC ULTRASOUND (EUS) RADIAL
Anesthesia: General

## 2020-04-11 MED ORDER — CIPROFLOXACIN HCL 500 MG PO TABS: 500.0000 mg | ORAL_TABLET | Freq: Two times a day (BID) | ORAL | 0 refills | Status: AC

## 2020-04-11 MED ORDER — CIPROFLOXACIN IN D5W 400 MG/200ML IV SOLN
INTRAVENOUS | Status: DC | PRN
  Administered 2020-04-11: 400 mg via INTRAVENOUS

## 2020-04-11 MED ORDER — EPHEDRINE SULFATE 50 MG/ML IJ SOLN
INTRAMUSCULAR | Status: DC | PRN
  Administered 2020-04-11: 5 mg via INTRAVENOUS
  Administered 2020-04-11: 10 mg via INTRAVENOUS
  Administered 2020-04-11: 5 mg via INTRAVENOUS
  Administered 2020-04-11: 10 mg via INTRAVENOUS

## 2020-04-11 MED ORDER — INDOMETHACIN 50 MG RE SUPP
RECTAL | Status: DC | PRN
  Administered 2020-04-11: 100 mg via RECTAL

## 2020-04-11 MED ORDER — INDOMETHACIN 50 MG RE SUPP
RECTAL | Status: AC
  Filled 2020-04-11: qty 2

## 2020-04-11 MED ORDER — LIDOCAINE 2% (20 MG/ML) 5 ML SYRINGE
INTRAMUSCULAR | Status: DC | PRN
  Administered 2020-04-11: 80 mg via INTRAVENOUS

## 2020-04-11 MED ORDER — ESMOLOL HCL 100 MG/10ML IV SOLN
INTRAVENOUS | Status: DC | PRN
  Administered 2020-04-11: 20 mg via INTRAVENOUS

## 2020-04-11 MED ORDER — GLUCAGON HCL RDNA (DIAGNOSTIC) 1 MG IJ SOLR
INTRAMUSCULAR | Status: DC | PRN
  Administered 2020-04-11 (×6): .25 mg via INTRAVENOUS

## 2020-04-11 MED ORDER — PROPOFOL 10 MG/ML IV BOLUS
INTRAVENOUS | Status: DC | PRN
  Administered 2020-04-11: 50 mg via INTRAVENOUS
  Administered 2020-04-11: 150 mg via INTRAVENOUS

## 2020-04-11 MED ORDER — SUGAMMADEX SODIUM 200 MG/2ML IV SOLN
INTRAVENOUS | Status: DC | PRN
  Administered 2020-04-11: 152.4 mg via INTRAVENOUS

## 2020-04-11 MED ORDER — CIPROFLOXACIN IN D5W 400 MG/200ML IV SOLN
INTRAVENOUS | Status: AC
  Filled 2020-04-11: qty 200

## 2020-04-11 MED ORDER — PROPOFOL 10 MG/ML IV BOLUS
INTRAVENOUS | Status: AC
  Filled 2020-04-11: qty 20

## 2020-04-11 MED ORDER — PHENYLEPHRINE 40 MCG/ML (10ML) SYRINGE FOR IV PUSH (FOR BLOOD PRESSURE SUPPORT)
PREFILLED_SYRINGE | INTRAVENOUS | Status: DC | PRN
  Administered 2020-04-11 (×2): 80 ug via INTRAVENOUS

## 2020-04-11 MED ORDER — FENTANYL CITRATE (PF) 100 MCG/2ML IJ SOLN
INTRAMUSCULAR | Status: AC
  Filled 2020-04-11: qty 2

## 2020-04-11 MED ORDER — ONDANSETRON HCL 4 MG/2ML IJ SOLN
INTRAMUSCULAR | Status: DC | PRN
  Administered 2020-04-11: 4 mg via INTRAVENOUS

## 2020-04-11 MED ORDER — LACTATED RINGERS IV SOLN: INTRAVENOUS | Status: DC

## 2020-04-11 MED ORDER — SODIUM CHLORIDE 0.9 % IV SOLN: INTRAVENOUS | Status: DC

## 2020-04-11 MED ORDER — ROCURONIUM BROMIDE 10 MG/ML (PF) SYRINGE
PREFILLED_SYRINGE | INTRAVENOUS | Status: DC | PRN
  Administered 2020-04-11: 50 mg via INTRAVENOUS

## 2020-04-11 MED ORDER — OMEPRAZOLE MAGNESIUM 20 MG PO TBEC: 40.0000 mg | DELAYED_RELEASE_TABLET | Freq: Every day | ORAL | 6 refills | Status: DC

## 2020-04-11 MED ORDER — FENTANYL CITRATE (PF) 100 MCG/2ML IJ SOLN
INTRAMUSCULAR | Status: DC | PRN
  Administered 2020-04-11: 100 ug via INTRAVENOUS

## 2020-04-11 MED ORDER — DEXAMETHASONE SODIUM PHOSPHATE 4 MG/ML IJ SOLN
INTRAMUSCULAR | Status: DC | PRN
  Administered 2020-04-11: 8 mg via INTRAVENOUS

## 2020-04-11 MED ORDER — GLUCAGON HCL RDNA (DIAGNOSTIC) 1 MG IJ SOLR
INTRAMUSCULAR | Status: AC
  Filled 2020-04-11: qty 1

## 2020-04-11 NOTE — Discharge Instructions (Signed)
YOU HAD AN ENDOSCOPIC PROCEDURE TODAY: Refer to the procedure report and other information in the discharge instructions given to you for any specific questions about what was found during the examination. If this information does not answer your questions, please call [facility] office at [phone] to clarify.   YOU SHOULD EXPECT: Some feelings of bloating in the abdomen. Passage of more gas than usual. Walking can help get rid of the air that was put into your GI tract during the procedure and reduce the bloating. If you had a lower endoscopy (such as a colonoscopy or flexible sigmoidoscopy) you may notice spotting of blood in your stool or on the toilet paper. Some abdominal soreness may be present for a day or two, also.  DIET: Your first meal following the procedure should be a light meal and then it is ok to progress to your normal diet. A half-sandwich or bowl of soup is an example of a good first meal. Heavy or fried foods are harder to digest and may make you feel nauseous or bloated. Drink plenty of fluids but you should avoid alcoholic beverages for 24 hours. If you had a esophageal dilation, please see attached instructions for diet.    ACTIVITY: Your care partner should take you home directly after the procedure. You should plan to take it easy, moving slowly for the rest of the day. You can resume normal activity the day after the procedure however YOU SHOULD NOT DRIVE, use power tools, machinery or perform tasks that involve climbing or major physical exertion for 24 hours (because of the sedation medicines used during the test).   SYMPTOMS TO REPORT IMMEDIATELY: A gastroenterologist can be reached at any hour. Please call [phone]  for any of the following symptoms:   Following upper endoscopy (EGD, EUS, ERCP, esophageal dilation) Vomiting of blood or coffee ground material  New, significant abdominal pain  New, significant chest pain or pain under the shoulder blades  Painful or  persistently difficult swallowing  New shortness of breath  Black, tarry-looking or red, bloody stools  FOLLOW UP:  If any biopsies were taken you will be contacted by phone or by letter within the next 1-3 weeks. Call [phone]  if you have not heard about the biopsies in 3 weeks.  Please also call with any specific questions about appointments or follow up tests.  

## 2020-04-11 NOTE — Transfer of Care (Signed)
Immediate Anesthesia Transfer of Care Note  Patient: Nathan Mora  Procedure(s) Performed: Procedure(s): UPPER ENDOSCOPIC ULTRASOUND (EUS) RADIAL (N/A) ENDOSCOPIC RETROGRADE CHOLANGIOPANCREATOGRAPHY (ERCP) WITH PROPOFOL (N/A) BIOPSY  Patient Location: PACU  Anesthesia Type:General  Level of Consciousness: Patient easily awoken, sedated, comfortable, cooperative, following commands, responds to stimulation.   Airway & Oxygen Therapy: Patient spontaneously breathing, ventilating well, oxygen via simple oxygen mask.  Post-op Assessment: Report given to PACU RN, vital signs reviewed and stable, moving all extremities.   Post vital signs: Reviewed and stable.  Complications: No apparent anesthesia complications Last Vitals:  Vitals Value Taken Time  BP 137/76 04/11/20 1300  Temp 37.3 C 04/11/20 1253  Pulse 93 04/11/20 1301  Resp 19 04/11/20 1301  SpO2 100 % 04/11/20 1301  Vitals shown include unvalidated device data.  Last Pain:  Vitals:   04/11/20 1253  TempSrc: Axillary  PainSc: Asleep         Complications: No complications documented.

## 2020-04-11 NOTE — Op Note (Signed)
[facility] Patient Name: Nathan Mora Procedure Date: 04/11/2020 MRN: [id number] Attending MD: Justice Britain , MD Date of Birth: [birth year] CSN: [id number] Age: 75 Admit Type: Outpatient Procedure:                ERCP Indications:              Malignant stricture of the common bile duct,                            Abnormal MRCP, Jaundice, Abnormal liver function                            test, Tumor of the head of pancreas Providers:                Justice Britain, MD, Cleda Daub, RN, Tyna Jaksch Technician Referring MD:             [name]. [name], MD Medicines:                General Anesthesia, Ciprofloxacin administered                            during EUS, Indomethacin 100 mg PR, Glucagon 1.5 mg                            IV Complications:            No immediate complications. Estimated Blood Loss:     Estimated blood loss was minimal. Procedure:                Pre-Anesthesia Assessment:                           - Prior to the procedure, a History and Physical                            was performed, and patient medications and                            allergies were reviewed. The patient's tolerance of                            previous anesthesia was also reviewed. The risks                            and benefits of the procedure and the sedation                            options and risks were discussed with the patient.                            All questions were answered, and informed consent  was obtained. Prior Anticoagulants: The patient has                            taken no previous anticoagulant or antiplatelet                            agents. ASA Grade Assessment: III - A patient with                            severe systemic disease. After reviewing the risks                            and benefits, the patient was deemed in                            satisfactory  condition to undergo the procedure.                           After obtaining informed consent, the scope was                            passed under direct vision. Throughout the                            procedure, the patient's blood pressure, pulse, and                            oxygen saturations were monitored continuously. The                            [employer] single use                            duodenoscope was introduced through the mouth, and                            used to inject contrast into and used to inject                            contrast into the bile duct and ventral pancreatic                            duct. The ERCP was extremely difficult due to                            challenging cannulation because of abnormal anatomy                            and challenging cannulation. Successful completion                            of the procedure was aided by performing the  maneuvers documented (below) in this report. The                            patient tolerated the procedure. Scope In: Scope Out: Findings:      The scout film was normal. The esophagus was successfully intubated       under direct vision without detailed examination of the pharynx, larynx,       and associated structures, and upper GI tract since it had been noted on       recent EUS. The major papilla was prominent and floppy.      The bile duct could not be cannulated with the Hydratome sphincterotome       in short or long position. Repeated attempts at biliary cannulation were       not successful while using a wire-guided approach. In the long-position,       I was able after bowing the sphinctertome, to get the wire into the       presumed ventral pancreatitic duct. Decision was made to pursue a       double-wire approach. The wire was left within the pancreatic duct.       Multiple times during procedure, the wire fell out but then  I was able       to replace it back into the pancreatic duct. At one point, with the       double-wire technique, I had what appeared to be adequate positioning       for the wire to go intrahepatic, but a presumed intramural injection was       made with the Revolution Jagtome sphincterotome. The bile duct could not       be cannulated with the Hydratome sphincterotome and Jagtome       sphincterotome. One 4 Fr by 3 cm temporary plastic pancreatic stent with       a single external pigtail was placed into the ventral pancreatic duct.       The stent was in good position. Decision made to puruse a biliary       pre-cut fistulotomy measuring 14 mm in length was made with a       monofilament needle knife using a freehand technique using ERBE       electrocautery. There was self limited oozing from the fistulotomy which       did not require treatment. A short 0.025 inch Antonietta Breach was passed into       the biliary tree over the needle-knife. The Revolution Jagtome       sphincterotome was passed over the guidewire and the bile duct was then       deeply cannulated. Contrast was injected. I personally interpreted the       bile duct images. Ductal flow of contrast was adequate. Image quality       was adequate. Contrast extended to the hepatic ducts. Opacification of       the entire biliary tree except for the cystic duct and gallbladder was       successful. The middle third of the main bile duct and upper third of       the main bile duct were moderately dilated, secondary to a stricture in       the distal bile duct. The largest diameter was 15 mm for the upper       biliary tree. Cells for cytology were obtained by  brushing in the lower       third of the main bile duct at the area of stricture. One 10 mm by 6 cm       uncovered metal biliary stent was placed into the common bile duct. Bile       flowed through the stent. The stent was in good position. The       duodenoscope was withdrawn  from the patient.      To evaluate the esophagus after multiple intubations, a standard       esophagogastroduodenoscopy scope was replaced and the scope was passed       under direct vision through the upper GI tract. Localized moderate       mucosal changes were found in the proximal esophagus with mucosal trauma       associated with the scope passage during both EUS/ERCP.      The EGD was removed from the patient. Impression:               - Mucosal changes in the esophagus noted in the                            proximal esophagus noted at the completion of the                            procedure likely a result of the scope                            passage/trauma.                           - The major papilla appeared to be prominent and                            floppy.                           - The upper third of the main bile duct and middle                            third of the main bile duct were moderately                            dilated, secondary to a stricture.                           - Difficult cannulation requiring double-wire                            attempt and then a biliary fistulotomy was                            performed.                           - Cells for cytology obtained in the lower third of  the main duct from the distal bile duct stricture.                           - One uncovered metal biliary stent was placed into                            the common bile duct.                           - One temporary plastic pancreatic stent was placed                            into the ventral pancreatic duct for PEP                            prophylaxis. Moderate Sedation:      Not Applicable - Patient had care per Anesthesia. Recommendation:           - The patient will be observed post-procedure,                            until all discharge criteria are met.                           - Discharge patient to home.                            - Patient has a contact number available for                            emergencies. The signs and symptoms of potential                            delayed complications were discussed with the                            patient. Return to normal activities tomorrow.                            Written discharge instructions were provided to the                            patient.                           - Low fat diet.                           - Observe patient's clinical course.                           - Await cytology results.                           - Watch for pancreatitis, bleeding, perforation,  and cholangitis.                           - Patient will need a KUB 2-view in 10-14 days to                            ensure pancreatic stent has migrated successfully.                            If still present at that time will need to be                            scheduled for EGD with stent pull.                           - The findings and recommendations were discussed                            with the patient.                           - The findings and recommendations were discussed                            with the patient's family. Procedure Code(s):        --- Professional ---                           [phone], Endoscopic retrograde                            cholangiopancreatography (ERCP); with placement of                            endoscopic stent into biliary or pancreatic duct,                            including pre- and post-dilation and guide wire                            passage, when performed, including sphincterotomy,                            when performed, each stent                           24825, 95, Endoscopic retrograde                            cholangiopancreatography (ERCP); with placement of                            endoscopic stent into biliary or pancreatic duct,                             including pre- and  post-dilation and guide wire                            passage, when performed, including sphincterotomy,                            when performed, each stent Diagnosis Code(s):        --- Professional ---                           K22.8, Other specified diseases of esophagus                           K83.1, Obstruction of bile duct                           R17, Unspecified jaundice                           R94.5, Abnormal results of liver function studies                           D49.0, Neoplasm of unspecified behavior of                            digestive system                           K83.8, Other specified diseases of biliary tract                           R93.2, Abnormal findings on diagnostic imaging of                            liver and biliary tract CPT copyright 2019 [employer]. All rights reserved. The codes documented in this report are preliminary and upon coder review may  be revised to meet current compliance requirements. Justice Britain, MD 04/11/2020 1:23:34 PM Number of Addenda: 0

## 2020-04-11 NOTE — Progress Notes (Signed)
CBG 263 after ERCP, Dr Ambrose Pancoast notified. No treatment needed per MD

## 2020-04-11 NOTE — H&P (Signed)
GASTROENTEROLOGY PROCEDURE H&P NOTE   Primary Care Physician: [name], MD  HPI: Nathan Mora is a 75 y.o. male who presents for EUS/ERCP attempt at obstructive jaundice concern for pancreatic cancer/malignancy.  Past Medical History:  Diagnosis Date  . Diabetes mellitus without complication ([place])   . Glaucoma    left eye  . Hyperlipidemia   . Lipid disorder 03/29/2012   History reviewed. No pertinent surgical history. Current Facility-Administered Medications  Medication Dose Route Frequency Provider Last Rate Last Admin  . 0.9 %  sodium chloride infusion   Intravenous Continuous Mansouraty, Telford Nab., MD      . lactated ringers infusion   Intravenous Continuous Mansouraty, Telford Nab., MD   New Bag at 04/11/20 [phone]   No Known Allergies Family History  Problem Relation Age of Onset  . Cancer Father        lung  . Heart attack Brother   . Hypertension Brother   . Hypertension Brother    Social History   Socioeconomic History  . Marital status: Married    Spouse name: Not on file  . Number of children: Not on file  . Years of education: Not on file  . Highest education level: Not on file  Occupational History  . Not on file  Tobacco Use  . Smoking status: Never Smoker  . Smokeless tobacco: Never Used  Substance and Sexual Activity  . Alcohol use: Yes    Alcohol/week: 4.0 standard drinks    Types: 4 Cans of beer per week  . Drug use: No  . Sexual activity: Yes  Other Topics Concern  . Not on file  Social History Narrative  . Not on file   Social Determinants of Health   Financial Resource Strain: Not on file  Food Insecurity: Not on file  Transportation Needs: Not on file  Physical Activity: Not on file  Stress: Not on file  Social Connections: Not on file  Intimate Partner Violence: Not on file    Physical Exam: Vital signs in last 24 hours: Temp:  [98 F (36.7 C)] 98 F (36.7 C) (03/16 0802) Pulse Rate:  [69] 69 (03/16 0802) Resp:   [18] 18 (03/16 0802) BP: (142)/(80) 142/80 (03/16 0802) SpO2:  [99 %] 99 % (03/16 0802) Weight:  [76.2 kg] 76.2 kg (03/16 0802)   GEN: NAD EYE: Sclerae anicteric ENT: MMM CV: Non-tachycardic GI: Soft, NT/ND NEURO:  Alert & Oriented x 3  Lab Results: No results for input(s): WBC, HGB, HCT, PLT in the last 72 hours. BMET No results for input(s): NA, K, CL, CO2, GLUCOSE, BUN, CREATININE, CALCIUM in the last 72 hours. LFT No results for input(s): PROT, ALBUMIN, AST, ALT, ALKPHOS, BILITOT, BILIDIR, IBILI in the last 72 hours. PT/INR No results for input(s): LABPROT, INR in the last 72 hours.   Impression / Plan: This is a 75 y.o.male who presents for EUS/ERCP attempt at obstructive jaundice concern for pancreatic cancer/malignancy.  The risks of an EUS including intestinal perforation, bleeding, infection, aspiration, and medication effects were discussed as was the possibility it may not give a definitive diagnosis if a biopsy is performed.  When a biopsy of the pancreas is done as part of the EUS, there is an additional risk of pancreatitis at the rate of about 1-2%.  It was explained that procedure related pancreatitis is typically mild, although it can be severe and even life threatening, which is why we do not perform random pancreatic biopsies and only  biopsy a lesion/area we feel is concerning enough to warrant the risk.  The risks of an ERCP were discussed at length, including but not limited to the risk of perforation, bleeding, abdominal pain, post-ERCP pancreatitis (while usually mild can be severe and even life threatening).  The risks and benefits of endoscopic evaluation were discussed with the patient; these include but are not limited to the risk of perforation, infection, bleeding, missed lesions, lack of diagnosis, severe illness requiring hospitalization, as well as anesthesia and sedation related illnesses.  The patient is agreeable to proceed.    Justice Britain,  MD [facility] Advanced Endoscopy Office # [phone] ]

## 2020-04-11 NOTE — Anesthesia Procedure Notes (Signed)
Procedure Name: Intubation Date/Time: 04/11/2020 9:04 AM Performed by: Deliah Boston, CRNA Pre-anesthesia Checklist: Patient identified, Emergency Drugs available, Suction available and Patient being monitored Patient Re-evaluated:Patient Re-evaluated prior to induction Oxygen Delivery Method: Circle system utilized Preoxygenation: Pre-oxygenation with 100% oxygen Induction Type: IV induction Ventilation: Mask ventilation without difficulty Laryngoscope Size: Mac and 4 Grade View: Grade III Tube type: Oral Tube size: 7.5 mm Number of attempts: 1 Airway Equipment and Method: Stylet and Oral airway Placement Confirmation: ETT inserted through vocal cords under direct vision,  positive ETCO2 and breath sounds checked- equal and bilateral Secured at: 21 cm Tube secured with: Tape Dental Injury: Teeth and Oropharynx as per pre-operative assessment  Comments: slightly anterior larynx

## 2020-04-11 NOTE — Op Note (Signed)
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 [name], MD Medicines:                General Anesthesia, Cipro 841 mg IV Complications:            No immediate complications. Estimated Blood Loss:     Estimated blood loss was minimal. Procedure:                Pre-Anesthesia Assessment:                           - Prior to the procedure, a History and Physical                            was performed, and patient medications and                            allergies were reviewed. The patient's tolerance of                            previous anesthesia was also reviewed. The risks                            and benefits of the procedure and the sedation                            options and risks were discussed with the patient.                            All questions were answered, and informed consent                            was obtained. Prior Anticoagulants: The patient has  taken no previous anticoagulant or antiplatelet                            agents. ASA Grade Assessment: III - A patient with                            severe systemic disease. After reviewing the risks                            and benefits, the patient was deemed in                            satisfactory condition to undergo the procedure.                            After obtaining informed consent, the endoscope was                            passed under direct vision. Throughout the                            procedure, the patient's blood pressure, pulse, and                            oxygen saturations were monitored continuously. The                            [id number]-[id number] ([id number] Olympus gastroscope was                            introduced through the mouth, and advanced to the                            second part of duodenum. The [employer] D single use duodenoscope was                            introduced through the mouth, and advanced to the                            second part of duodenum. The ([id number]) [id number]                            Linear EUS was introduced through the mouth, and                            advanced to the duodenum for ultrasound examination                            from the stomach and duodenum. The upper EUS was  somewhat difficult due to a J-shaped stomach which                            made pyloric intubation difficult. Successful                            completion of the procedure was aided by performing                            the maneuvers documented (below) in this report.                            The patient tolerated the procedure. Scope In: Scope Out: Findings:      ENDOSCOPIC FINDING: :      No gross lesions were noted in the entire esophagus.      The Z-line was irregular and was found 41 cm from the incisors.      Patchy moderate inflammation characterized by erosions, erythema,       friability and granularity was found in the gastric body, in the gastric       antrum and in the prepyloric region of the stomach. Biopsies were taken       with a cold forceps for histology and Helicobacter pylori testing.      No gross lesions were noted in the duodenal bulb, in the first portion       of the duodenum  and in the second portion of the duodenum. Biopsies for       histology were taken with a cold forceps for evaluation of celiac       disease.      A floppy, protuberant ampulla was found.      ENDOSONOGRAPHIC FINDING: :      An irregular mass was identified in the pancreatic head. The mass was       hypoechoic, mixed solid and cystic. The mass measured 41 mm by 26 mm in       maximal cross-sectional diameter - cystic region was 13 mm by 21 mm. The       outer margins were irregular. There was sonographic evidence suggesting       invasion into the portal vein (manifested by abutment). An intact       interface was seen between the mass and the superior mesenteric artery       and celiac trunk suggesting a lack of invasion. The remainder of the       pancreas was examined. The endosonographic appearance of parenchyma and       the upstream pancreatic duct indicated duct dilation (PDN - 10.5 mm, PDB       10.6 mm, PDT - 8.1 mm), parenchymal atrophy. Fine needle biopsy was       performed. Color Doppler imaging was utilized prior to needle puncture       to confirm a lack of significant vascular structures within the needle       path. Six passes were made with the 22 gauge ultrasound core biopsy       needle using a transduodenal approach. Visible cores of tissue were       obtained. Preliminary cytologic examination and touch preps were       performed. Final cytology results are pending.  There was dilation in the common bile duct (11.1 mm), in the common       hepatic duct (13.9 mm) and in the gallbladder.      Endosonographic imaging in the visualized portion of the liver showed no       mass-lesion.      No malignant-appearing lymph nodes were visualized in the celiac region       (level 20), peripancreatic region and porta hepatis region.      The celiac region was visualized. Impression:               EGD Impression:                           - No gross lesions in esophagus.                            - Z-line irregular, 41 cm from the incisors.                           - Gastritis. Biopsied.                           - No gross lesions in the duodenal bulb, in the                            first portion of the duodenum and in the second                            portion of the duodenum. Biopsied.                           - Floppy, protuberant ampulla was found in the                            duodenum.                           EUS Impression:                           - A mass was identified in the pancreatic head.                            Tissue was obtained from this exam, and results are                            pending. However, the endosonographic appearance is                            highly suspicious for adenocarcinoma. This was                            staged T3 N0 Mx by endosonographic criteria. The                            staging applies if malignancy is  confirmed. Fine                            needle biopsy performed.                           - There was dilation in the common bile duct, in                            the common hepatic duct and in the gallbladder.                           - No malignant-appearing lymph nodes were                            visualized in the celiac region (level 20),                            peripancreatic region and porta hepatis region. Moderate Sedation:      Not Applicable - Patient had care per Anesthesia. Recommendation:           - Proceed to scheduled ERCP.                           - Observe patient's clinical course.                           - Await cytology results and await path results.                           - Start Omeprazole 40 mg daily.                           - Ciprofloxacin 500 mg twice daily for 5-days to                            decrease risk of post-EUS due to cystic component                            of the mass.                           - Patient will need  CA19-9 to be obtained as well                            as a CMP and CBC including CT-CAP to complete                            staging if malignancy is confirmed on final                            pathology. Referrals will be placed for                            Oncology/Surgery/Radiation Oncology based  on final                            pathology confirmation.                           - The findings and recommendations were discussed                            with the patient.                           - The findings and recommendations were discussed                            with the patient's family. Procedure Code(s):        --- Professional ---                           [phone], Esophagogastroduodenoscopy, flexible,                            transoral; with transendoscopic ultrasound-guided                            intramural or transmural fine needle                            aspiration/biopsy(s), (includes endoscopic                            ultrasound examination limited to the esophagus,                            stomach or duodenum, and adjacent structures) Diagnosis Code(s):        --- Professional ---                           K22.8, Other specified diseases of esophagus                           K29.70, Gastritis, unspecified, without bleeding                           K31.89, Other diseases of stomach and duodenum                           K86.89, Other specified diseases of pancreas                           I89.9, Noninfective disorder of lymphatic vessels                            and lymph nodes, unspecified                           K83.1, Obstruction of bile duct  K86.2, Cyst of pancreas                           R74.8, Abnormal levels of other serum enzymes                           K83.8, Other specified diseases of biliary tract                           K82.8, Other specified diseases of gallbladder                            R93.3, Abnormal findings on diagnostic imaging of                            other parts of digestive tract CPT copyright 2019 [employer]. All rights reserved. The codes documented in this report are preliminary and upon coder review may  be revised to meet current compliance requirements. Justice Britain, MD 04/11/2020 1:05:05 PM Number of Addenda: 0

## 2020-04-12 ENCOUNTER — Encounter (HOSPITAL_COMMUNITY): Payer: Self-pay | Admitting: Gastroenterology

## 2020-04-12 LAB — SURGICAL PATHOLOGY

## 2020-04-12 LAB — CANCER ANTIGEN 19-9: CA 19-9: 2449 U/mL — ABNORMAL HIGH (ref 0–35)

## 2020-04-12 LAB — CYTOLOGY - NON PAP

## 2020-04-12 NOTE — Anesthesia Postprocedure Evaluation (Signed)
Anesthesia Post Note  Patient: Nathan Mora  Procedure(s) Performed: UPPER ENDOSCOPIC ULTRASOUND (EUS) RADIAL (N/A ) ENDOSCOPIC RETROGRADE CHOLANGIOPANCREATOGRAPHY (ERCP) WITH PROPOFOL (N/A ) BIOPSY SPHINCTEROTOMY PANCREATIC STENT PLACEMENT BILIARY STENT PLACEMENT (N/A ) FINE NEEDLE ASPIRATION (FNA) LINEAR BILIARY BRUSHING ESOPHAGOGASTRODUODENOSCOPY (EGD) WITH PROPOFOL (N/A )     Patient location during evaluation: PACU Anesthesia Type: General Level of consciousness: awake and alert Pain management: pain level controlled Vital Signs Assessment: post-procedure vital signs reviewed and stable Respiratory status: spontaneous breathing, nonlabored ventilation, respiratory function stable and patient connected to nasal cannula oxygen Cardiovascular status: blood pressure returned to baseline and stable Postop Assessment: no apparent nausea or vomiting Anesthetic complications: no   No complications documented.  Last Vitals:  Vitals:   04/11/20 1400 04/11/20 1410  BP: 137/71 124/81  Pulse: 79 80  Resp: 20 18  Temp:    SpO2: 94% 93%    Last Pain:  Vitals:   04/11/20 1410  TempSrc:   PainSc: 0-No pain                 Rabon Scholle

## 2020-04-13 ENCOUNTER — Other Ambulatory Visit: Payer: Self-pay

## 2020-04-13 DIAGNOSIS — C259 Malignant neoplasm of pancreas, unspecified: Secondary | ICD-10-CM

## 2020-04-15 ENCOUNTER — Telehealth: Payer: Self-pay | Admitting: Family Medicine

## 2020-04-15 DIAGNOSIS — E119 Type 2 diabetes mellitus without complications: Secondary | ICD-10-CM

## 2020-04-15 DIAGNOSIS — R739 Hyperglycemia, unspecified: Secondary | ICD-10-CM

## 2020-04-15 MED ORDER — BLOOD GLUCOSE METER KIT
PACK | 0 refills | Status: DC
Start: 2020-04-15 — End: 2022-04-03

## 2020-04-15 NOTE — Telephone Encounter (Signed)
Called pt to check on him and review recent findings. Has been feeling great since procedure. regained appetite and eating well. Only slight dicomfort at times noted. intial few days after diagnosis were hard but he is doing better with things now. Has better understanding now and understands what may need to occur and the plan moving forward. Denies current depression, no difficulty sleeping. Denies acute needs at this time.   I do see that CT chest, abdomen and pelvis have been ordered. Referral also placed to oncology. Unsure of general surgery referral - patient under impression that Dr. Rush Landmark will refer to Dr. Barry Dienes or Zenia Resides.  I am happy to refer if not. Patient considering meeting with surgeon at [facility]. Will let me know if another referral needed.   Plans on CMP tomorrow (through GI).  Has restarted metfomin past few days.  No recent home readings. Needs new meter. Sent to [employer]. Home readings discussed and update in next week - call sooner if over 300.   All questions were answered and let him know to advise me if he or his wife have any further questions. Will watch for labs and results above to determine next step.

## 2020-04-16 ENCOUNTER — Other Ambulatory Visit (INDEPENDENT_AMBULATORY_CARE_PROVIDER_SITE_OTHER): Payer: Medicare HMO

## 2020-04-16 DIAGNOSIS — C259 Malignant neoplasm of pancreas, unspecified: Secondary | ICD-10-CM | POA: Diagnosis not present

## 2020-04-16 LAB — COMPREHENSIVE METABOLIC PANEL
ALT: 217 U/L — ABNORMAL HIGH (ref 0–53)
AST: 55 U/L — ABNORMAL HIGH (ref 0–37)
Albumin: 3.8 g/dL (ref 3.5–5.2)
Alkaline Phosphatase: 1084 U/L — ABNORMAL HIGH (ref 39–117)
BUN: 10 mg/dL (ref 6–23)
CO2: 24 mEq/L (ref 19–32)
Calcium: 8.6 mg/dL (ref 8.4–10.5)
Chloride: 98 mEq/L (ref 96–112)
Creatinine, Ser: 0.66 mg/dL (ref 0.40–1.50)
GFR: 92.86 mL/min (ref >60.00)
Glucose, Bld: 368 mg/dL — ABNORMAL HIGH (ref 70–99)
Potassium: 3.6 mEq/L (ref 3.5–5.1)
Sodium: 131 mEq/L — ABNORMAL LOW (ref 135–145)
Total Bilirubin: 5 mg/dL — ABNORMAL HIGH (ref 0.2–1.2)
Total Protein: 5.9 g/dL — ABNORMAL LOW (ref 6.0–8.3)

## 2020-04-17 ENCOUNTER — Other Ambulatory Visit: Payer: Self-pay

## 2020-04-17 DIAGNOSIS — K831 Obstruction of bile duct: Secondary | ICD-10-CM

## 2020-04-17 DIAGNOSIS — C259 Malignant neoplasm of pancreas, unspecified: Secondary | ICD-10-CM

## 2020-04-18 ENCOUNTER — Telehealth: Payer: Self-pay | Admitting: *Deleted

## 2020-04-18 NOTE — Progress Notes (Signed)
Spoke with patient regarding referral we received from Dr. Rush Landmark.  I have scheduled him for Thursday 3/31 at 2 pm with Dr. [name].  I have asked that he arrive at least 15 minutes prior for registration purposes.  He is aware he can bring one person with him to the consult and that valet services are available.  He agreed to this appointment.

## 2020-04-18 NOTE — Telephone Encounter (Signed)
Wife called to report that per Dr. Rush Landmark, he has been diagnosed with pancreas cancer and she is asking if a referral to Dr. Benay Spice has been ordered yet. They want to see Dr. Benay Spice.  Asking also if he would follow him after surgery if it is done at [facility] or [facility]? Wife reported that she is a [employer] scientist. Forwarded message to GI Navigator.

## 2020-04-19 ENCOUNTER — Encounter (HOSPITAL_COMMUNITY): Payer: Self-pay

## 2020-04-19 ENCOUNTER — Other Ambulatory Visit: Payer: Self-pay

## 2020-04-19 ENCOUNTER — Ambulatory Visit (HOSPITAL_COMMUNITY)
Admission: RE | Admit: 2020-04-19 | Discharge: 2020-04-19 | Disposition: A | Discharge status: Home / Self Care | Payer: Medicare HMO | Source: Ambulatory Visit | Attending: Gastroenterology | Admitting: Gastroenterology

## 2020-04-19 DIAGNOSIS — J8410 Pulmonary fibrosis, unspecified: Secondary | ICD-10-CM | POA: Diagnosis not present

## 2020-04-19 DIAGNOSIS — N400 Enlarged prostate without lower urinary tract symptoms: Secondary | ICD-10-CM | POA: Diagnosis not present

## 2020-04-19 DIAGNOSIS — I700 Atherosclerosis of aorta: Secondary | ICD-10-CM | POA: Diagnosis not present

## 2020-04-19 DIAGNOSIS — C259 Malignant neoplasm of pancreas, unspecified: Secondary | ICD-10-CM | POA: Diagnosis not present

## 2020-04-19 DIAGNOSIS — I2510 Atherosclerotic heart disease of native coronary artery without angina pectoris: Secondary | ICD-10-CM | POA: Diagnosis not present

## 2020-04-19 DIAGNOSIS — N200 Calculus of kidney: Secondary | ICD-10-CM | POA: Diagnosis not present

## 2020-04-19 DIAGNOSIS — K3189 Other diseases of stomach and duodenum: Secondary | ICD-10-CM | POA: Diagnosis not present

## 2020-04-19 DIAGNOSIS — K838 Other specified diseases of biliary tract: Secondary | ICD-10-CM | POA: Diagnosis not present

## 2020-04-19 HISTORY — DX: Malignant (primary) neoplasm, unspecified: C80.1

## 2020-04-19 MED ORDER — IOHEXOL 300 MG/ML  SOLN
100.0000 mL | Freq: Once | INTRAMUSCULAR | Status: AC | PRN
  Administered 2020-04-19: 100 mL via INTRAVENOUS

## 2020-04-20 ENCOUNTER — Encounter: Payer: Self-pay | Admitting: Family Medicine

## 2020-04-23 NOTE — Telephone Encounter (Signed)
Pt requesting CT result please advise

## 2020-04-24 DIAGNOSIS — K317 Polyp of stomach and duodenum: Secondary | ICD-10-CM | POA: Diagnosis not present

## 2020-04-24 DIAGNOSIS — K3189 Other diseases of stomach and duodenum: Secondary | ICD-10-CM | POA: Diagnosis not present

## 2020-04-24 DIAGNOSIS — K2980 Duodenitis without bleeding: Secondary | ICD-10-CM | POA: Diagnosis not present

## 2020-04-25 ENCOUNTER — Other Ambulatory Visit: Payer: Self-pay

## 2020-04-25 NOTE — Progress Notes (Signed)
The proposed treatment discussed in conference is for discussion purposes only and is not a binding recommendation.  The patients have not been physically examined, or presented with their treatment options.  Therefore, final treatment plans cannot be decided.   

## 2020-04-26 ENCOUNTER — Other Ambulatory Visit: Payer: Self-pay

## 2020-04-26 ENCOUNTER — Inpatient Hospital Stay: Payer: Medicare HMO | Attending: Oncology | Admitting: Oncology

## 2020-04-26 ENCOUNTER — Inpatient Hospital Stay (HOSPITAL_BASED_OUTPATIENT_CLINIC_OR_DEPARTMENT_OTHER): Payer: Medicare HMO

## 2020-04-26 VITALS — BP 117/63 | HR 76 | Temp 98.0°F | Resp 16 | Ht 68.0 in | Wt 169.5 lb

## 2020-04-26 DIAGNOSIS — Z23 Encounter for immunization: Secondary | ICD-10-CM

## 2020-04-26 DIAGNOSIS — C250 Malignant neoplasm of head of pancreas: Secondary | ICD-10-CM | POA: Diagnosis not present

## 2020-04-26 DIAGNOSIS — E785 Hyperlipidemia, unspecified: Secondary | ICD-10-CM | POA: Diagnosis not present

## 2020-04-26 DIAGNOSIS — E1165 Type 2 diabetes mellitus with hyperglycemia: Secondary | ICD-10-CM | POA: Diagnosis not present

## 2020-04-26 DIAGNOSIS — H409 Unspecified glaucoma: Secondary | ICD-10-CM | POA: Diagnosis not present

## 2020-04-26 DIAGNOSIS — C259 Malignant neoplasm of pancreas, unspecified: Secondary | ICD-10-CM

## 2020-04-26 MED ORDER — PANCRELIPASE (LIP-PROT-AMYL) 36000-114000 UNITS PO CPEP: ORAL_CAPSULE | ORAL | 2 refills | Status: DC

## 2020-04-26 NOTE — Patient Instructions (Signed)
   Covid-19 Vaccination Clinic  Name:  MAURICO PERRELL    MRN: [id number] DOB: [birth year]  04/26/2020  Mr. Nomura was observed post Covid-19 immunization for 15 minutes  without incident. He was provided with Vaccine Information Sheet and instruction to access the V-Safe system.   Mr. Dunlap was instructed to call 911 with any severe reactions post vaccine: Marland Kitchen Difficulty breathing  . Swelling of face and throat  . A fast heartbeat  . A bad rash all over body  . Dizziness and weakness   @[email or web address] @

## 2020-04-26 NOTE — Progress Notes (Signed)
START ON PATHWAY REGIMEN - Pancreatic Adenocarcinoma     A cycle is every 14 days:     Oxaliplatin      Leucovorin      Irinotecan      Fluorouracil   **Always confirm dose/schedule in your pharmacy ordering system**  Patient Characteristics: Preoperative (Clinical Staging), Borderline Resectable, PS = 0,1, BRCA1/2 and PALB2 Mutation Absent/Unknown Therapeutic Status: Preoperative (Clinical Staging) AJCC T Category: cT4 AJCC N Category: cN0 Resectability Status: Borderline Resectable AJCC M Category: cM0 AJCC 8 Stage Grouping: III ECOG Performance Status: 0 BRCA1/2 Mutation Status: Awaiting Test Results PALB2 Mutation Status: Awaiting Test Results Intent of Therapy: Curative Intent, Discussed with Patient

## 2020-04-26 NOTE — Progress Notes (Signed)
[facility] New Patient Consult   Requesting MD: [name], Md [address] [place],  [place] [address]   MOHANNAD Mora 75 y.o.  25-Oct-1945    Reason for Consult: Pancreas cancer   HPI: Mr.Nathan Mora intermittent diarrhea since March 2021.  He was evaluated by gastroenterology and reports there was no specific diagnosis and the diarrhea spontaneously resolved.  He developed jaundice a few weeks ago and was referred to Dr. Lilia Argue.  An MRI/MRCP on 04/05/2020 field moderate intrahepatic biliary dilatation with moderate common bile duct dilatation with an abrupt cut off of the distal common bile duct.  No hepatic lesions.  Pancreatic main duct and sidebranch ductal dilatation with pancreatic parenchymal atrophy.  A complex cystic/solid lesion is noted in the pancreas head measuring 2.6 x 2.2 cm.  No evidence of vascular involvement. He was taken to an ERCP/EUS on 04/11/2020.  There was difficulty accessing the bile duct.  A fistulotomy was performed and the bile duct was cannulated.  There was dilation of the middle and upper third of the main bile duct with a stricture in the distal bile duct.  Brushings were obtained in the lower third of the bile duct.  An uncovered metal biliary stent was placed. Biopsies were taken from the gastric antrum and prepyloric regions of the stomach in areas of erythema.  An irregular mass in the pancreas head measures 41 x 26 mm with a cystic component measuring 13 x 21 mm.  There was evidence of abutment of the portal vein.  There was pancreatic duct dilation with parenchymal atrophy.  A fine-needle biopsy was performed.  No malignant appearing lymph nodes in the celiac, peripancreatic, and porta hepatis regions.  The lesion was staged as a T3 N0 MX tumor by EUS criteria.  He has noted improvement in jaundice following placement of the bile duct stent. The cytology from the bile duct brushing returned as reactive glandular cells.  The  cytology from the pancreas biopsy revealed malignant cells consistent with adenocarcinoma.  Biopsy from the duodenum revealed peptic duodenitis.  The stomach biopsy revealed mild reactive gastropathy with a negative H. pylori stain.  He was referred for CTs of the chest, abdomen, and pelvis on 04/19/2020.  A 3 mm left lower lobe nodule was seen.  No focal liver abnormality.  Mild Intermatic biliary duct prominence.  A 3.3 x 2.9 cm heterogenous pancreas head mass with a pancreatic duct stent in place.  Dilated main pancreatic duct.  The portal vein is patent with mass-effect on the portal vein secondary to the pancreas head mass.  The mass is contiguous with the wall of the portal splenic confluence without encasement of the portal vein or superior mesenteric vein.  Short segment of contact between the SMA, less than 90 degrees.  Hepatoduodenal ligament lymph nodes measure up to 10 mm in short axis.   Past Medical History:  Diagnosis Date  . Cancer (HCC)-pancreas  04/11/2020  . Diabetes mellitus without complication ([facility])   . Glaucoma    left eye  . Hyperlipidemia   . Lipid disorder 03/29/2012    Past Surgical History:  Procedure Laterality Date  . BILIARY BRUSHING  04/11/2020   Procedure: BILIARY BRUSHING;  Surgeon: Rush Landmark Telford Nab., MD;  Location: [facility];  Service: Gastroenterology;;  . BILIARY STENT PLACEMENT N/A 04/11/2020   Procedure: BILIARY STENT PLACEMENT;  Surgeon: Irving Copas., MD;  Location: [facility];  Service: Gastroenterology;  Laterality: N/A;  . BIOPSY  04/11/2020   Procedure: BIOPSY;  Surgeon: Irving Copas., MD;  Location: [facility];  Service: Gastroenterology;;  . ENDOSCOPIC RETROGRADE CHOLANGIOPANCREATOGRAPHY (ERCP) WITH PROPOFOL N/A 04/11/2020   Procedure: ENDOSCOPIC RETROGRADE CHOLANGIOPANCREATOGRAPHY (ERCP) WITH PROPOFOL;  Surgeon: Irving Copas., MD;  Location: [facility];  Service: Gastroenterology;  Laterality: N/A;  .  ESOPHAGOGASTRODUODENOSCOPY (EGD) WITH PROPOFOL N/A 04/11/2020   Procedure: ESOPHAGOGASTRODUODENOSCOPY (EGD) WITH PROPOFOL;  Surgeon: Rush Landmark Telford Nab., MD;  Location: [facility];  Service: Gastroenterology;  Laterality: N/A;  . EUS N/A 04/11/2020   Procedure: UPPER ENDOSCOPIC ULTRASOUND (EUS) RADIAL;  Surgeon: Rush Landmark Telford Nab., MD;  Location: [facility];  Service: Gastroenterology;  Laterality: N/A;  . FINE NEEDLE ASPIRATION  04/11/2020   Procedure: FINE NEEDLE ASPIRATION (FNA) LINEAR;  Surgeon: Irving Copas., MD;  Location: [facility];  Service: Gastroenterology;;  . PANCREATIC STENT PLACEMENT  04/11/2020   Procedure: PANCREATIC STENT PLACEMENT;  Surgeon: Irving Copas., MD;  Location: [facility];  Service: Gastroenterology;;  . [name]  04/11/2020   Procedure: [name];  Surgeon: Mansouraty, Telford Nab., MD;  Location: [facility];  Service: Gastroenterology;;    Medications: Reviewed  Allergies: No Known Allergies  Family history: His father died of lung cancer at age 29 and was a smoker.  No other family history of cancer  Social History:   He lives with his wife in [place].  He owns a [employer].  He does not use cigarettes.  Reports 5 liquor or wine drinks per week.  No transfusion history.  No risk factor for HIV or hepatitis.  He has received the COVID-19 vaccines.  ROS:   Positives include: Right leg rash for 5 years-resolved after a course of antibiotics, diarrhea resolved after placement of the biliary stent, dark urine prior to biliary stent placement  A complete ROS was otherwise negative.  Physical Exam:  Blood pressure 117/63, pulse 76, temperature 98 F (36.7 C), temperature source Tympanic, resp. rate 16, height 5\' 8"  (1.727 m), weight 169 lb 8 oz (76.9 kg), SpO2 99 %.  HEENT: Mild scleral icterus, neck without mass Lungs: Clear bilaterally Cardiac: Regular rate and rhythm Abdomen: No mass, nontender, no  hepatosplenomegaly GU: Testes without mass Vascular: No leg edema Lymph nodes: No cervical, supraclavicular, axillary, or inguinal nodes Neurologic: Alert and oriented, the motor stem appears intact in the upper and lower extremities bilaterally  Skin: No rash Musculoskeletal: No spine tenderness   LAB:  CBC  Lab Results  Component Value Date   WBC 6.8 04/11/2020   HGB 14.3 04/11/2020   HCT 44.7 04/11/2020   MCV 94.3 04/11/2020   PLT 175 04/11/2020   NEUTROABS 3.2 11/13/2016        CMP  Lab Results  Component Value Date   NA 131 (L) 04/16/2020   K 3.6 04/16/2020   CL 98 04/16/2020   CO2 24 04/16/2020   GLUCOSE 368 (H) 04/16/2020   BUN 10 04/16/2020   CREATININE 0.66 04/16/2020   CALCIUM 8.6 04/16/2020   PROT 5.9 (L) 04/16/2020   ALBUMIN 3.8 04/16/2020   AST 55 (H) 04/16/2020   ALT 217 (H) 04/16/2020   ALKPHOS 1,084 (H) 04/16/2020   BILITOT 5.0 (H) 04/16/2020   GFRNONAA >60 04/11/2020   GFRAA 99 12/21/2019      Imaging:  As per HPI, CT images from 04/19/2020 reviewed with Mr. Venson and his wife   Assessment/Plan:   1. 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  Fat planes around the celiac axis preserved.  Less than 90 degree contact of the lateral wall of the SMA, no evidence of metastatic disease, 3 mm left lower lobe pulmonary nodule 2. Obstructive jaundice secondary #1  ERCP  04/11/2020-prominent and floppy major papilla, dilated bile duct secondary to a distal stricture, placement of an uncovered metal stent, bile duct brushings-reactive glandular cells 3.   Diabetes  4.   Hyperlipidemia  5.   Glaucoma   Disposition:   Mr. Haycraft has been diagnosed with pancreas cancer.  He appears to have borderline resectable disease based on the abutment of the portal vein and less than 90 degree contact with the SMA.  There is no evidence of distant metastatic disease.  I discussed the diagnosis of pancreas cancer and treatment options with Mr. Mottola and his wife.  We discussed the rationale for neoadjuvant chemotherapy.  His case was presented at the GI tumor conference on 04/25/2020.  The consensus recommendation from the conference attendees is to recommend neoadjuvant chemotherapy.  He appears to be a candidate for FOLFIRINOX.  We reviewed potential toxicities associated with the FOLFIRINOX regimen including the chance of nausea/vomiting, mucositis, diarrhea, alopecia, and hematologic toxicity.  We discussed the sun sensitivity, rash, hyperpigmentation, and hand/foot syndrome associated with 5-fluorouracil.  We reviewed the allergic reaction and various types of neuropathy associated with oxaliplatin.  We discussed the acute and delayed diarrhea seen with irinotecan.  He agrees to proceed.  Mr Melnyk has diabetes.  He will schedule follow-up with Dr. Nyoka Cowden to discuss a strategy to improve his hyperglycemia.  He understands he will receive Decadron as part of the chemotherapy antiemetic regimen and this will likely result in elevated blood sugars for several days.  He may benefit from an insulin sliding scale.  He will be referred for Port-A-Cath placement and surgical consultation.  He will attend a chemotherapy class and have baseline laboratory studies next week.  Mr Dooling will be scheduled for cycle 1 FOLFIRINOX 05/10/2020.  Irinotecan will be held with cycle 1.  We will add  irinotecan with cycle 2 if he tolerates the chemotherapy well.  He will be referred to the genetics counselor.  [name], MD  04/26/2020, 2:19 PM

## 2020-04-27 ENCOUNTER — Telehealth: Payer: Self-pay | Admitting: Nutrition

## 2020-04-27 ENCOUNTER — Other Ambulatory Visit: Payer: Self-pay

## 2020-04-27 ENCOUNTER — Telehealth: Payer: Self-pay | Admitting: Oncology

## 2020-04-27 DIAGNOSIS — C259 Malignant neoplasm of pancreas, unspecified: Secondary | ICD-10-CM

## 2020-04-27 MED ORDER — PANCRELIPASE (LIP-PROT-AMYL) 36000-114000 UNITS PO CPEP: 36000.0000 [IU] | ORAL_CAPSULE | Freq: Three times a day (TID) | ORAL | 0 refills | Status: DC

## 2020-04-27 NOTE — Telephone Encounter (Signed)
I called and spoke with patient regarding all appointments scheduled.  He was ok with all dates & times.  Chemo ed appointment was moved to prior to port placement per his request per 4/1 sch msg

## 2020-04-27 NOTE — Progress Notes (Signed)
Met with patient and his wife at his initial medical oncology consult with Dr. [name].  I explained my role as nurse navigator and they were given my direct contact information.  He understands he is being referred to Genetics, a order has been put in for port a cath placement.  He knows he will be rescheduling a call from IR regarding the port placement and from [facility] regarding setting up his first treatment.

## 2020-04-27 NOTE — Telephone Encounter (Signed)
Scheduled appt per 4/1 sch msg. Called pt, no answer. Left msg with appt date and time.  

## 2020-04-30 DIAGNOSIS — C250 Malignant neoplasm of head of pancreas: Secondary | ICD-10-CM | POA: Diagnosis not present

## 2020-04-30 DIAGNOSIS — K8681 Exocrine pancreatic insufficiency: Secondary | ICD-10-CM | POA: Diagnosis not present

## 2020-05-01 ENCOUNTER — Encounter: Payer: Self-pay | Admitting: Nurse Practitioner

## 2020-05-01 ENCOUNTER — Telehealth: Payer: Self-pay | Admitting: Nutrition

## 2020-05-01 ENCOUNTER — Inpatient Hospital Stay: Payer: Medicare HMO | Attending: Oncology | Admitting: Nutrition

## 2020-05-01 DIAGNOSIS — C250 Malignant neoplasm of head of pancreas: Secondary | ICD-10-CM | POA: Insufficient documentation

## 2020-05-01 DIAGNOSIS — K831 Obstruction of bile duct: Secondary | ICD-10-CM | POA: Insufficient documentation

## 2020-05-01 DIAGNOSIS — E119 Type 2 diabetes mellitus without complications: Secondary | ICD-10-CM | POA: Insufficient documentation

## 2020-05-01 DIAGNOSIS — Z5111 Encounter for antineoplastic chemotherapy: Secondary | ICD-10-CM | POA: Insufficient documentation

## 2020-05-01 DIAGNOSIS — E785 Hyperlipidemia, unspecified: Secondary | ICD-10-CM | POA: Insufficient documentation

## 2020-05-01 DIAGNOSIS — Z794 Long term (current) use of insulin: Secondary | ICD-10-CM | POA: Insufficient documentation

## 2020-05-01 DIAGNOSIS — H409 Unspecified glaucoma: Secondary | ICD-10-CM | POA: Insufficient documentation

## 2020-05-01 DIAGNOSIS — D696 Thrombocytopenia, unspecified: Secondary | ICD-10-CM | POA: Insufficient documentation

## 2020-05-01 NOTE — Telephone Encounter (Signed)
75 year old male diagnosed with pancreas cancer followed by Dr. Benay Spice to begin FOLFIRINOX.  Past medical history includes hyperlipidemia and diabetes.  Medications include Creon, Imodium A-D, Glucophage, and Prilosec.  Labs include sodium 131 and glucose 368 on March 21. Height: 68 inches. Weight: 169.5 pounds on March 31. Usual body weight: Approximately 180 pounds in 2021. BMI: 25.77.  Patient reports he is eating well.  He is trying to gain back weight that he lost.  He currently denies nutrition impact symptoms and will be starting chemotherapy soon.  Dietary recall reveals patient eats yogurt with fruit or cereal and hot chocolate at breakfast, sandwich or hamburger with fruit and water at lunch and a meat, vegetable and starch with water at dinner.  He has been adding in a cookie or a small milkshake.  Reports so far he is not gaining weight but seems to be continuing to lose weight.  Nutrition diagnosis: Food and nutrition related knowledge deficit related to pancreas cancer and associated treatments as evidenced by no prior need for nutrition related information.  Intervention: Educated patient on the importance of smaller more frequent meals and snacks with higher calorie, high-protein foods. Educated to avoid concentrated sweets. Encouraged combining carbohydrates with protein for improved glycemic control. Reviewed examples of appropriate snacks and shakes. Questions were answered.  Teach back method used.  Mailed nutrition fact sheets with contact information.  Monitoring, evaluation, goals: Patient will tolerate adequate calories and protein to minimize weight loss.  Next visit: Patient to contact RD via phone as needed.  **Disclaimer: This note was dictated with voice recognition software. Similar sounding words can inadvertently be transcribed and this note may contain transcription errors which may not have been corrected upon publication of note.**

## 2020-05-01 NOTE — Progress Notes (Signed)
See telephone note.

## 2020-05-02 ENCOUNTER — Encounter: Payer: Medicare HMO | Admitting: Nutrition

## 2020-05-02 ENCOUNTER — Telehealth: Payer: Self-pay

## 2020-05-02 ENCOUNTER — Other Ambulatory Visit: Payer: Self-pay | Admitting: Student

## 2020-05-02 ENCOUNTER — Other Ambulatory Visit: Payer: Self-pay

## 2020-05-02 ENCOUNTER — Other Ambulatory Visit: Payer: Medicare HMO

## 2020-05-02 DIAGNOSIS — C259 Malignant neoplasm of pancreas, unspecified: Secondary | ICD-10-CM

## 2020-05-02 NOTE — Telephone Encounter (Signed)
Pt's wife called and requested a referral for Endocrinologist at [facility]. Per Dr. Benay Spice referral sent for Pt to see Dr. Cruzita Lederer and faxed as well today.

## 2020-05-02 NOTE — Telephone Encounter (Signed)
-----   Message from [name], MD sent at 05/01/2020  7:43 PM EDT ----- Regarding: RE: Endocrine referral Please refer to Dr Renne Crigler ----- Message ----- From: Kelli Hope, LPN Sent: 4/0/9811   5:05 PM EDT To: [name], MD Subject: Endocrine referral                             Hi Dr. Benay Spice, Pt requesting a referral for  Endocrinologist at [facility].

## 2020-05-04 ENCOUNTER — Ambulatory Visit (HOSPITAL_COMMUNITY)
Admission: RE | Admit: 2020-05-04 | Discharge: 2020-05-04 | Disposition: A | Discharge status: Home / Self Care | Payer: Medicare HMO | Source: Ambulatory Visit | Attending: Oncology | Admitting: Oncology

## 2020-05-04 ENCOUNTER — Other Ambulatory Visit: Payer: Medicare HMO

## 2020-05-04 ENCOUNTER — Other Ambulatory Visit: Payer: Self-pay | Admitting: Oncology

## 2020-05-04 ENCOUNTER — Other Ambulatory Visit: Payer: Self-pay

## 2020-05-04 ENCOUNTER — Encounter (HOSPITAL_COMMUNITY): Payer: Self-pay

## 2020-05-04 ENCOUNTER — Ambulatory Visit (HOSPITAL_COMMUNITY): Payer: Medicare HMO

## 2020-05-04 DIAGNOSIS — E119 Type 2 diabetes mellitus without complications: Secondary | ICD-10-CM | POA: Diagnosis not present

## 2020-05-04 DIAGNOSIS — C61 Malignant neoplasm of prostate: Secondary | ICD-10-CM | POA: Diagnosis not present

## 2020-05-04 DIAGNOSIS — C250 Malignant neoplasm of head of pancreas: Secondary | ICD-10-CM

## 2020-05-04 DIAGNOSIS — Z79899 Other long term (current) drug therapy: Secondary | ICD-10-CM | POA: Diagnosis not present

## 2020-05-04 DIAGNOSIS — E785 Hyperlipidemia, unspecified: Secondary | ICD-10-CM | POA: Insufficient documentation

## 2020-05-04 DIAGNOSIS — C259 Malignant neoplasm of pancreas, unspecified: Secondary | ICD-10-CM | POA: Insufficient documentation

## 2020-05-04 DIAGNOSIS — Z452 Encounter for adjustment and management of vascular access device: Secondary | ICD-10-CM | POA: Diagnosis not present

## 2020-05-04 DIAGNOSIS — Z7984 Long term (current) use of oral hypoglycemic drugs: Secondary | ICD-10-CM | POA: Diagnosis not present

## 2020-05-04 HISTORY — PX: IR IMAGING GUIDED PORT INSERTION: IMG5740

## 2020-05-04 LAB — GLUCOSE, CAPILLARY: Glucose-Capillary: 311 mg/dL — ABNORMAL HIGH (ref 70–99)

## 2020-05-04 MED ORDER — FENTANYL CITRATE (PF) 100 MCG/2ML IJ SOLN
INTRAMUSCULAR | Status: AC | PRN
  Administered 2020-05-04 (×2): 50 ug via INTRAVENOUS

## 2020-05-04 MED ORDER — HEPARIN SOD (PORK) LOCK FLUSH 100 UNIT/ML IV SOLN
INTRAVENOUS | Status: AC
  Filled 2020-05-04: qty 5

## 2020-05-04 MED ORDER — LIDOCAINE-EPINEPHRINE 1 %-1:100000 IJ SOLN
INTRAMUSCULAR | Status: AC
  Filled 2020-05-04: qty 1

## 2020-05-04 MED ORDER — MIDAZOLAM HCL 2 MG/2ML IJ SOLN
INTRAMUSCULAR | Status: AC
  Filled 2020-05-04: qty 4

## 2020-05-04 MED ORDER — LIDOCAINE-EPINEPHRINE 1 %-1:100000 IJ SOLN
INTRAMUSCULAR | Status: AC | PRN
Start: 2020-05-04 — End: 2020-05-04
  Administered 2020-05-04: 10 mL

## 2020-05-04 MED ORDER — SODIUM CHLORIDE 0.9 % IV SOLN: INTRAVENOUS | Status: DC

## 2020-05-04 MED ORDER — HEPARIN SOD (PORK) LOCK FLUSH 100 UNIT/ML IV SOLN
INTRAVENOUS | Status: AC | PRN
  Administered 2020-05-04: 500 [IU] via INTRAVENOUS

## 2020-05-04 MED ORDER — MIDAZOLAM HCL 2 MG/2ML IJ SOLN
INTRAMUSCULAR | Status: AC | PRN
  Administered 2020-05-04 (×3): 1 mg via INTRAVENOUS

## 2020-05-04 MED ORDER — FENTANYL CITRATE (PF) 100 MCG/2ML IJ SOLN
INTRAMUSCULAR | Status: AC
  Filled 2020-05-04: qty 2

## 2020-05-04 NOTE — Procedures (Signed)
Interventional Radiology Procedure Note ° °Procedure: Single Lumen Power Port Placement   ° °Access:  Right internal jugular vein ° °Findings: Catheter tip positioned at cavoatrial junction. Port is ready for immediate use.  ° °Complications: None ° °EBL: < 10 mL ° °Recommendations:  °- Ok to shower in 24 hours °- Do not submerge for 7 days °- Routine line care  ° ° °Laveah Gloster, MD ° ° ° °

## 2020-05-04 NOTE — Consult Note (Signed)
Chief Complaint: Patient was seen in consultation today for Port-A-Cath placement  Referring Physician(s): Sherrill,Gary B  Supervising Physician: Ruthann Cancer  Patient Status: [facility] - Out-pt  History of Present Illness: Nathan Mora is a 75 y.o. male history of diabetes, glaucoma, hyperlipidemia, and newly diagnosed pancreatic cancer who presents today for Port-A-Cath placement for chemotherapy.  Past Medical History:  Diagnosis Date  . Cancer ([place])   . Diabetes mellitus without complication ([place])   . Glaucoma    left eye  . Hyperlipidemia   . Lipid disorder 03/29/2012    Past Surgical History:  Procedure Laterality Date  . BILIARY BRUSHING  04/11/2020   Procedure: BILIARY BRUSHING;  Surgeon: Rush Landmark Telford Nab., MD;  Location: [facility];  Service: Gastroenterology;;  . BILIARY STENT PLACEMENT N/A 04/11/2020   Procedure: BILIARY STENT PLACEMENT;  Surgeon: Irving Copas., MD;  Location: [facility];  Service: Gastroenterology;  Laterality: N/A;  . BIOPSY  04/11/2020   Procedure: BIOPSY;  Surgeon: Rush Landmark Telford Nab., MD;  Location: [facility];  Service: Gastroenterology;;  . ENDOSCOPIC RETROGRADE CHOLANGIOPANCREATOGRAPHY (ERCP) WITH PROPOFOL N/A 04/11/2020   Procedure: ENDOSCOPIC RETROGRADE CHOLANGIOPANCREATOGRAPHY (ERCP) WITH PROPOFOL;  Surgeon: Irving Copas., MD;  Location: [facility];  Service: Gastroenterology;  Laterality: N/A;  . ESOPHAGOGASTRODUODENOSCOPY (EGD) WITH PROPOFOL N/A 04/11/2020   Procedure: ESOPHAGOGASTRODUODENOSCOPY (EGD) WITH PROPOFOL;  Surgeon: Rush Landmark Telford Nab., MD;  Location: [facility];  Service: Gastroenterology;  Laterality: N/A;  . EUS N/A 04/11/2020   Procedure: UPPER ENDOSCOPIC ULTRASOUND (EUS) RADIAL;  Surgeon: Rush Landmark Telford Nab., MD;  Location: [facility];  Service: Gastroenterology;  Laterality: N/A;  . FINE NEEDLE ASPIRATION  04/11/2020   Procedure: FINE NEEDLE ASPIRATION (FNA) LINEAR;  Surgeon:  Irving Copas., MD;  Location: [facility];  Service: Gastroenterology;;  . PANCREATIC STENT PLACEMENT  04/11/2020   Procedure: PANCREATIC STENT PLACEMENT;  Surgeon: Irving Copas., MD;  Location: [facility];  Service: Gastroenterology;;  . [name]  04/11/2020   Procedure: [name];  Surgeon: Irving Copas., MD;  Location: [facility];  Service: Gastroenterology;;    Allergies: Patient has no known allergies.  Medications: Prior to Admission medications   Medication Sig Start Date End Date Taking? Authorizing Provider  acetaminophen (TYLENOL) 500 MG tablet Take 500 mg by mouth every 6 (six) hours as needed.   Yes [provider]  atorvastatin (LIPITOR) 40 MG tablet Take 0.5 tablets (20 mg total) by mouth daily at 6 PM. 10/20/19  Yes [name], MD  blood glucose meter kit and supplies Test once per day - fasting or 2 hours after meal. Dispense based on patient and insurance preference. 04/15/20  Yes [name], MD  brimonidine-timolol (COMBIGAN) 0.2-0.5 % ophthalmic solution Place 1 drop into the left eye every 12 (twelve) hours.   Yes [provider]  doxazosin (CARDURA) 2 MG tablet Take 1 tablet (2 mg total) by mouth daily. 10/20/19  Yes [name], MD  gabapentin (NEURONTIN) 800 MG tablet Take 400 mg by mouth at bedtime as needed (pain).   Yes [provider]  ibuprofen (ADVIL) 200 MG tablet Take 400 mg by mouth every evening.   Yes [provider]  Lancets (ONETOUCH DELICA PLUS [id number]) [place] Apply topically as directed. 09/17/19  Yes [provider]  latanoprost (XALATAN) 0.005 % ophthalmic solution Place 1 drop into the left eye at bedtime.   Yes [provider]  lipase/protease/amylase (CREON) 36000 UNITS CPEP capsule Take 1 capsule (36,000 Units total) by mouth  3 (three) times daily with meals. 04/27/20 07/26/20 Yes [name], MD  metFORMIN (GLUCOPHAGE) 500 MG tablet  Take 1 tablet (500 mg total) by mouth daily with breakfast. 12/21/19  Yes [name], MD  [facility] ULTRA test strip TEST EVERY DAY FASTING OR 2 HOURS AFTER A MEAL 12/13/19  Yes [name], MD  loperamide (IMODIUM A-D) 2 MG tablet Take 2 mg by mouth 4 (four) times daily as needed for diarrhea or loose stools.    [provider]  omeprazole (PRILOSEC OTC) 20 MG tablet Take 2 tablets (40 mg total) by mouth daily. 04/11/20 07/25/20  Mansouraty, Telford Nab., MD  sildenafil (REVATIO) 20 MG tablet 1 to 3 tabs up to QD prior to onset of sexual activity. Patient taking differently: Take 20-60 mg by mouth daily as needed (erectile dysfunction). 04/08/19   [name], MD     Family History  Problem Relation Age of Onset  . Cancer Father        lung  . Heart attack Brother   . Hypertension Brother   . Hypertension Brother     Social History   Socioeconomic History  . Marital status: Married    Spouse name: Not on file  . Number of children: Not on file  . Years of education: Not on file  . Highest education level: Not on file  Occupational History  . Not on file  Tobacco Use  . Smoking status: Never Smoker  . Smokeless tobacco: Never Used  Substance and Sexual Activity  . Alcohol use: Yes    Alcohol/week: 4.0 standard drinks    Types: 4 Cans of beer per week  . Drug use: No  . Sexual activity: Yes  Other Topics Concern  . Not on file  Social History Narrative  . Not on file   Social Determinants of Health   Financial Resource Strain: Not on file  Food Insecurity: Not on file  Transportation Needs: Not on file  Physical Activity: Not on file  Stress: Not on file  Social Connections: Not on file      Review of Systems:  currently denies fever, headache, chest pain, dyspnea, cough, abdominal/back pain, nausea, vomiting or bleeding  Vital Signs: Vitals:   05/04/20 1323  BP: 124/84  Pulse: 69  Resp: 16  Temp: 97.8 F (36.6 C)  SpO2: 100%     Ht _0  (1.727 m)   Wt 169 lb 8 oz (76.9 kg)   BMI 25.77 kg/m   Physical Exam awake, alert.  Chest clear to auscultation bilaterally.  Heart with regular rate and rhythm.  Abdomen soft, positive bowel sounds, nontender.  No lower extremity edema.  Imaging: CT CHEST ABDOMEN PELVIS W CONTRAST  Result Date: 04/19/2020 CLINICAL DATA:  Pancreatic cancer.  Staging. EXAM: CT CHEST, ABDOMEN, AND PELVIS WITH CONTRAST TECHNIQUE: Multidetector CT imaging of the chest, abdomen and pelvis was performed following the standard protocol during bolus administration of intravenous contrast. CONTRAST:  169m OMNIPAQUE IOHEXOL 300 MG/ML  SOLN COMPARISON:  No comparison studies available. FINDINGS: CT CHEST FINDINGS Cardiovascular: The heart size is normal. No substantial pericardial effusion. Coronary artery calcification is evident. No thoracic aortic aneurysm. Mediastinum/Nodes: No mediastinal lymphadenopathy. There is no hilar lymphadenopathy. The esophagus has normal imaging features. There is no axillary lymphadenopathy. Lungs/Pleura: No focal airspace consolidation. No pleural effusion. Calcified granuloma noted medial right middle lobe. 3 mm left lower lobe pulmonary nodule identified on image 103/4. No overtly suspicious pulmonary nodule or  mass. No focal airspace consolidation. No pleural effusion. Musculoskeletal: No worrisome lytic or sclerotic osseous abnormality. CT ABDOMEN PELVIS FINDINGS Hepatobiliary: No focal abnormality within the liver parenchyma. There is mild intrahepatic biliary duct prominence with pneumobilia secondary to common bile duct stent in situ. Gallbladder is nondistended. Pancreas: Heterogeneous pancreatic head mass measures approximately 3.3 x 2.9 cm. Pancreatic duct stent is identified in the head of the pancreas. Main pancreatic duct is markedly dilated through the body and tail region. Spleen: No splenomegaly. No focal mass lesion. Adrenals/Urinary Tract: No adrenal nodule or mass.  3 mm nonobstructing stone interpolar right kidney with 5.6 cm exophytic cyst posterior interpolar right kidney. Tiny nonobstructing stone noted interpolar left kidney. No evidence for hydroureter. The urinary bladder appears normal for the degree of distention. Stomach/Bowel: Stomach is decompressed. Duodenum is normally positioned as is the ligament of Treitz. No small bowel wall thickening. No small bowel dilatation. The terminal ileum is normal. The appendix is normal. No gross colonic mass. No colonic wall thickening. Vascular/Lymphatic: There is abdominal aortic atherosclerosis without aneurysm. Portal vein is patent and there is some mass-effect on the portal vein secondary to the pancreatic head mass with portal vein displaced posteriorly. The mass lesion is contiguous with the wall of the portal splenic confluence but does not encase the portal vein or superior mesenteric vein. Splenic vein is unaffected in patent. Fat plane of the celiac axis is well preserved. SMA shows only a short segment of contact between the lesion and the right lateral wall the SMA over length 7-8 mm. This is visible on axial image 62/2 and coronal image 56/5. A contact between the pancreatic head lesion in the SMA is less than 90 degrees of the SMA circumference. No lymphadenopathy in the abdomen. Hepatoduodenal ligament lymph nodes measure up to maximum 10 mm short axis. No peripancreatic lymphadenopathy. No para-aortic lymphadenopathy. No pelvic sidewall lymphadenopathy. Reproductive: Prostate gland is mildly enlarged. Other: No intraperitoneal free fluid. Musculoskeletal: No worrisome lytic or sclerotic osseous abnormality. IMPRESSION: 1. Heterogeneous pancreatic head mass measures approximately 3.3 x 2.9 cm. The mass lesion is contiguous with the anterior wall of the portal splenic confluence, displacing the venous anatomy posteriorly but does not encase the portal vein or superior mesenteric vein. Fat planes around the celiac  axis or well preserved. Lesion does contact the lateral wall of the SMA over less than 90 degrees of the SMA circumference. 2. No definite metastatic disease in the chest, abdomen, or pelvis. Hepatoduodenal ligament lymph nodes measure up to maximum 10 mm short axis, upper normal size by CT criteria. 3. No evidence for distant metastatic disease in the chest, abdomen, or pelvis. 4. 3 mm left lower lobe pulmonary nodule. Attention on follow-up recommended. 5. Bilateral nonobstructing nephrolithiasis. 6. Prostatomegaly. 7. Aortic Atherosclerosis (ICD10-I70.0). Electronically Signed   By: Misty Stanley M.D.   On: 04/19/2020 16:15   DG ERCP  Result Date: 04/11/2020 CLINICAL DATA:  75 year old male with a history of pancreatic head lesion and ductal dilatation EXAM: ERCP TECHNIQUE: Multiple spot images obtained with the fluoroscopic device and submitted for interpretation post-procedure. FLUOROSCOPY TIME:  Fluoroscopy Time:  15 minutes 33 seconds COMPARISON:  MR 04/05/2020 FINDINGS: Limited intraoperative fluoroscopic spot images during ERCP. Initial image demonstrates the endoscope projecting over the upper abdomen. There is subsequently placement of safety wire into the biliary system and partial opacification with contrast. Final image demonstrates placement of a metallic stent within the common bile duct. IMPRESSION: Limited images during ERCP demonstrates placement of a  metallic stent within the common bile duct. Please refer to the dictated operative report for full details of intraoperative findings and procedure. Electronically Signed   By: Corrie Mckusick D.O.   On: 04/11/2020 14:35   MR ABDOMEN MRCP W WO CONTAST  Result Date: 04/05/2020 CLINICAL DATA:  Elevated liver enzymes.  Abnormal ultrasound. EXAM: MRI ABDOMEN WITHOUT AND WITH CONTRAST (INCLUDING MRCP) TECHNIQUE: Multiplanar multisequence MR imaging of the abdomen was performed both before and after the administration of intravenous contrast. Heavily  T2-weighted images of the biliary and pancreatic ducts were obtained, and three-dimensional MRCP images were rendered by post processing. CONTRAST:  74m MULTIHANCE GADOBENATE DIMEGLUMINE 529 MG/ML IV SOLN COMPARISON:  Ultrasound 04/04/2020 FINDINGS: Lower chest: The lung bases are grossly clear. No pleural or pericardial effusion. No worrisome pulmonary lesions. Hepatobiliary: Moderate intrahepatic biliary dilatation. There is also moderate distention of the gallbladder measuring slightly greater than 5 cm in transverse dimension. No gallstones or findings for acute cholecystitis. Moderate common bile duct dilatation measuring up to 13 mm in the porta hepatis and 10 mm in the head of the pancreas. There appears to be a fairly abrupt cut off of the distal common bile duct. No hepatic lesions are identified. The portal and hepatic veins are patent. Pancreas: Significant chronic pancreatic main duct and side branch ductal dilatation with severe associated pancreatic parenchymal atrophy. Findings appear to be due to a complex cystic and partially solid lesion in the pancreatic head. This measures a maximum approximately 2.6 x 2.2 cm. No findings suspicious for vascular involvement/invasion. The aorta and major branch vessels are patent. The portal and splenic veins are grossly normal. Spleen:  Normal size.  No focal lesions. Adrenals/Urinary Tract:  The adrenal glands are normal. Bilateral renal cysts but no worrisome renal lesions or hydronephrosis. Stomach/Bowel: The stomach, duodenum, small bowel and colon are grossly normal. Vascular/Lymphatic: The aorta and branch vessels are normal. I do not see any obvious vascular invasion/involvement. Other:  No ascites or abdominal wall hernia. Musculoskeletal: No significant bony findings. IMPRESSION: 1. 2.6 x 2.2 cm complex cystic and partially solid lesion in the pancreatic head most consistent with a pancreatic neoplasm. The lesion is causing common bile duct and  pancreatic duct obstruction. No findings for vascular involvement/invasion. Recommend EUS, biopsy and possible stenting. 2. Moderate gallbladder distention but no gallstones. 3. No evidence of abdominal metastatic disease. No hepatic lesions or abdominal lymphadenopathy. These results will be called to the ordering clinician or representative by the Radiologist Assistant, and communication documented in the PACS or [employer] Electronically Signed   By: PMarijo SanesM.D.   On: 04/05/2020 13:21    Labs:  CBC: Recent Labs    09/02/19 0929 04/11/20 1412  WBC 6.1 6.8  HGB 14.4 14.3  HCT 43.3* 44.7  PLT  --  175    COAGS: No results for input(s): INR, APTT in the last 8760 hours.  BMP: Recent Labs    09/02/19 1015 12/21/19 1158 04/11/20 1412 04/16/20 1459  NA 139 142 130* 131*  K 4.3 4.7 4.3 3.6  CL 103 105 98 98  CO2 23 25 20* 24  GLUCOSE 159* 137* 261* 368*  BUN _0 CALCIUM 8.9 9.1 8.6* 8.6  CREATININE 0.84 0.86 0.46* 0.66  GFRNONAA 87 86 >60  --   GFRAA 100 99  --   --     LIVER FUNCTION TESTS: Recent Labs    09/02/19 1015 12/21/19 1158 04/11/20 1412 04/16/20 1459  BILITOT 0.7 0.7 15.9* 5.0*  AST 28 29 526* 55*  ALT 60* 53* 705* 217*  ALKPHOS 80 69 1,940* 1,084*  PROT 6.1 6.3 6.0* 5.9*  ALBUMIN 4.5 4.6 3.2* 3.8    TUMOR MARKERS: No results for input(s): AFPTM, CEA, CA199, CHROMGRNA in the last 8760 hours.  Assessment and Plan: 75 y.o. male history of diabetes, glaucoma, hyperlipidemia, and newly diagnosed pancreatic cancer who presents today for Port-A-Cath placement for chemotherapy.Risks and benefits of image guided port-a-catheter placement was discussed with the patient including, but not limited to bleeding, infection, pneumothorax, or fibrin sheath development and need for additional procedures.  All of the patient's questions were answered, patient is agreeable to proceed. Consent signed and in chart.      Thank you for this  interesting consult.  I greatly enjoyed meeting KYSON KUPPER and look forward to participating in their care.  A copy of this report was sent to the requesting provider on this date.  Electronically Signed: D. Rowe Robert, PA-C 05/04/2020, 1:20 PM   I spent a total of 25 minutes    in face to face in clinical consultation, greater than 50% of which was counseling/coordinating care for Port-A-Cath placement

## 2020-05-04 NOTE — Discharge Instructions (Signed)
Please call Interventional Radiology clinic [phone] with any questions or concerns.  You may remove your dressing and shower tomorrow.  Do not submerge in a tub or pool until site heals.  DO NOT use EMLA cream for 2 weeks after port placement as this cream will remove surgical glue on your incision.   Implanted Port Insertion, Care After This sheet gives you information about how to care for yourself after your procedure. Your health care provider may also give you more specific instructions. If you have problems or questions, contact your health care provider. What can I expect after the procedure? After the procedure, it is common to have:  Discomfort at the port insertion site.  Bruising on the skin over the port. This should improve over 3-4 days. Follow these instructions at home: [facility] care  After your port is placed, you will get a manufacturer's information card. The card has information about your port. Keep this card with you at all times.  Take care of the port as told by your health care provider. Ask your health care provider if you or a family member can get training for taking care of the port at home. A home health care nurse may also take care of the port.  Make sure to remember what type of port you have. Incision care  Follow instructions from your health care provider about how to take care of your port insertion site. Make sure you: ? Wash your hands with soap and water before and after you change your bandage (dressing). If soap and water are not available, use hand sanitizer. ? Change your dressing as told by your health care provider. ? Leave stitches (sutures), skin glue, or adhesive strips in place. These skin closures may need to stay in place for 2 weeks or longer. If adhesive strip edges start to loosen and curl up, you may trim the loose edges. Do not remove adhesive strips completely unless your health care provider tells you to do that.  Check your  port insertion site every day for signs of infection. Check for: ? Redness, swelling, or pain. ? Fluid or blood. ? Warmth. ? Pus or a bad smell.      Activity  Return to your normal activities as told by your health care provider. Ask your health care provider what activities are safe for you.  Do not lift anything that is heavier than 10 lb (4.5 kg), or the limit that you are told, until your health care provider says that it is safe. General instructions  Take over-the-counter and prescription medicines only as told by your health care provider.  Do not take baths, swim, or use a hot tub until your health care provider approves. Ask your health care provider if you may take showers. You may only be allowed to take sponge baths.  Do not drive for 24 hours if you were given a sedative during your procedure.  Wear a medical alert bracelet in case of an emergency. This will tell any health care providers that you have a port.  Keep all follow-up visits as told by your health care provider. This is important. Contact a health care provider if:  You cannot flush your port with saline as directed, or you cannot draw blood from the port.  You have a fever or chills.  You have redness, swelling, or pain around your port insertion site.  You have fluid or blood coming from your port insertion site.  Your port insertion  site feels warm to the touch.  You have pus or a bad smell coming from the port insertion site. Get help right away if:  You have chest pain or shortness of breath.  You have bleeding from your port that you cannot control. Summary  Take care of the port as told by your health care provider. Keep the manufacturer's information card with you at all times.  Change your dressing as told by your health care provider.  Contact a health care provider if you have a fever or chills or if you have redness, swelling, or pain around your port insertion site.  Keep all  follow-up visits as told by your health care provider. This information is not intended to replace advice given to you by your health care provider. Make sure you discuss any questions you have with your health care provider. Document Revised: 08/11/2017 Document Reviewed: 08/11/2017 Elsevier Patient Education  2021 [place].   Moderate Conscious Sedation, Adult, Care After This sheet gives you information about how to care for yourself after your procedure. Your health care provider may also give you more specific instructions. If you have problems or questions, contact your health care provider. What can I expect after the procedure? After the procedure, it is common to have:  Sleepiness for several hours.  Impaired judgment for several hours.  Difficulty with balance.  Vomiting if you eat too soon. Follow these instructions at home: For the time period you were told by your health care provider:  Rest.  Do not participate in activities where you could fall or become injured.  Do not drive or use machinery.  Do not drink alcohol.  Do not take sleeping pills or medicines that cause drowsiness.  Do not make important decisions or sign legal documents.  Do not take care of children on your own.      Eating and drinking  Follow the diet recommended by your health care provider.  Drink enough fluid to keep your urine pale yellow.  If you vomit: ? Drink water, juice, or soup when you can drink without vomiting. ? Make sure you have little or no nausea before eating solid foods.   General instructions  Take over-the-counter and prescription medicines only as told by your health care provider.  Have a responsible adult stay with you for the time you are told. It is important to have someone help care for you until you are awake and alert.  Do not smoke.  Keep all follow-up visits as told by your health care provider. This is important. Contact a health care  provider if:  You are still sleepy or having trouble with balance after 24 hours.  You feel light-headed.  You keep feeling nauseous or you keep vomiting.  You develop a rash.  You have a fever.  You have redness or swelling around the IV site. Get help right away if:  You have trouble breathing.  You have new-onset confusion at home. Summary  After the procedure, it is common to feel sleepy, have impaired judgment, or feel nauseous if you eat too soon.  Rest after you get home. Know the things you should not do after the procedure.  Follow the diet recommended by your health care provider and drink enough fluid to keep your urine pale yellow.  Get help right away if you have trouble breathing or new-onset confusion at home. This information is not intended to replace advice given to you by your health care provider.  Make sure you discuss any questions you have with your health care provider. Document Revised: 05/13/2019 Document Reviewed: 12/09/2018 Elsevier Patient Education  2021 [employer].

## 2020-05-05 ENCOUNTER — Other Ambulatory Visit: Payer: Self-pay | Admitting: Oncology

## 2020-05-07 ENCOUNTER — Other Ambulatory Visit: Payer: Self-pay

## 2020-05-07 ENCOUNTER — Inpatient Hospital Stay: Payer: Medicare HMO

## 2020-05-07 DIAGNOSIS — C259 Malignant neoplasm of pancreas, unspecified: Secondary | ICD-10-CM

## 2020-05-07 MED ORDER — PROCHLORPERAZINE MALEATE 10 MG PO TABS: 10.0000 mg | ORAL_TABLET | Freq: Four times a day (QID) | ORAL | 0 refills | Status: DC | PRN

## 2020-05-07 MED ORDER — LIDOCAINE-PRILOCAINE 2.5-2.5 % EX CREA: 1.0000 "application " | TOPICAL_CREAM | CUTANEOUS | 0 refills | Status: DC | PRN

## 2020-05-08 DIAGNOSIS — C259 Malignant neoplasm of pancreas, unspecified: Secondary | ICD-10-CM | POA: Diagnosis not present

## 2020-05-08 DIAGNOSIS — E1165 Type 2 diabetes mellitus with hyperglycemia: Secondary | ICD-10-CM | POA: Diagnosis not present

## 2020-05-09 ENCOUNTER — Inpatient Hospital Stay: Payer: Medicare HMO

## 2020-05-09 ENCOUNTER — Encounter: Payer: Self-pay | Admitting: Nurse Practitioner

## 2020-05-09 ENCOUNTER — Other Ambulatory Visit: Payer: Self-pay

## 2020-05-09 ENCOUNTER — Inpatient Hospital Stay (HOSPITAL_BASED_OUTPATIENT_CLINIC_OR_DEPARTMENT_OTHER): Payer: Medicare HMO | Admitting: Nurse Practitioner

## 2020-05-09 ENCOUNTER — Telehealth: Payer: Self-pay | Admitting: Nurse Practitioner

## 2020-05-09 VITALS — BP 120/63 | HR 70 | Temp 97.7°F | Resp 18 | Ht 68.0 in | Wt 166.8 lb

## 2020-05-09 DIAGNOSIS — H409 Unspecified glaucoma: Secondary | ICD-10-CM | POA: Diagnosis not present

## 2020-05-09 DIAGNOSIS — K831 Obstruction of bile duct: Secondary | ICD-10-CM | POA: Diagnosis not present

## 2020-05-09 DIAGNOSIS — D696 Thrombocytopenia, unspecified: Secondary | ICD-10-CM | POA: Diagnosis not present

## 2020-05-09 DIAGNOSIS — C250 Malignant neoplasm of head of pancreas: Secondary | ICD-10-CM | POA: Diagnosis not present

## 2020-05-09 DIAGNOSIS — E785 Hyperlipidemia, unspecified: Secondary | ICD-10-CM | POA: Diagnosis not present

## 2020-05-09 DIAGNOSIS — E119 Type 2 diabetes mellitus without complications: Secondary | ICD-10-CM | POA: Diagnosis not present

## 2020-05-09 DIAGNOSIS — Z5111 Encounter for antineoplastic chemotherapy: Secondary | ICD-10-CM | POA: Diagnosis not present

## 2020-05-09 DIAGNOSIS — Z794 Long term (current) use of insulin: Secondary | ICD-10-CM | POA: Diagnosis not present

## 2020-05-09 LAB — CBC WITH DIFFERENTIAL (CANCER CENTER ONLY)
Abs Immature Granulocytes: 0.11 10*3/uL — ABNORMAL HIGH (ref 0.00–0.07)
Basophils Absolute: 0 10*3/uL (ref 0.0–0.1)
Basophils Relative: 0 %
Eosinophils Absolute: 0 10*3/uL (ref 0.0–0.5)
Eosinophils Relative: 0 %
HCT: 35.5 % — ABNORMAL LOW (ref 39.0–52.0)
Hemoglobin: 12.1 g/dL — ABNORMAL LOW (ref 13.0–17.0)
Immature Granulocytes: 1 %
Lymphocytes Relative: 34 %
Lymphs Abs: 2.9 10*3/uL (ref 0.7–4.0)
MCH: 30.8 pg (ref 26.0–34.0)
MCHC: 34.1 g/dL (ref 30.0–36.0)
MCV: 90.3 fL (ref 80.0–100.0)
Monocytes Absolute: 1.1 10*3/uL — ABNORMAL HIGH (ref 0.1–1.0)
Monocytes Relative: 13 %
Neutro Abs: 4.3 10*3/uL (ref 1.7–7.7)
Neutrophils Relative %: 52 %
Platelet Count: 121 10*3/uL — ABNORMAL LOW (ref 150–400)
RBC: 3.93 MIL/uL — ABNORMAL LOW (ref 4.22–5.81)
RDW: 16 % — ABNORMAL HIGH (ref 11.5–15.5)
WBC Count: 8.4 10*3/uL (ref 4.0–10.5)
nRBC: 0 % (ref 0.0–0.2)

## 2020-05-09 LAB — CMP (CANCER CENTER ONLY)
ALT: 19 U/L (ref 0–44)
AST: 15 U/L (ref 15–41)
Albumin: 3.9 g/dL (ref 3.5–5.0)
Alkaline Phosphatase: 204 U/L — ABNORMAL HIGH (ref 38–126)
Anion gap: 8 (ref 5–15)
BUN: 16 mg/dL (ref 8–23)
CO2: 25 mmol/L (ref 22–32)
Calcium: 8.6 mg/dL — ABNORMAL LOW (ref 8.9–10.3)
Chloride: 102 mmol/L (ref 98–111)
Creatinine: 0.56 mg/dL — ABNORMAL LOW (ref 0.61–1.24)
GFR, Estimated: >60 mL/min (ref >60)
Glucose, Bld: 332 mg/dL — ABNORMAL HIGH (ref 70–99)
Potassium: 4.2 mmol/L (ref 3.5–5.1)
Sodium: 135 mmol/L (ref 135–145)
Total Bilirubin: 1.9 mg/dL — ABNORMAL HIGH (ref 0.3–1.2)
Total Protein: 6.1 g/dL — ABNORMAL LOW (ref 6.5–8.1)

## 2020-05-09 MED ORDER — SODIUM CHLORIDE 0.9 % IV SOLN
400.0000 mg/m2 | Freq: Once | INTRAVENOUS | Status: DC
  Filled 2020-05-09: qty 38.4

## 2020-05-09 MED ORDER — SODIUM CHLORIDE 0.9 % IV SOLN
10.0000 mg | Freq: Once | INTRAVENOUS | Status: AC
  Administered 2020-05-09: 10 mg via INTRAVENOUS
  Filled 2020-05-09: qty 1

## 2020-05-09 MED ORDER — DEXTROSE 5 % IV SOLN
Freq: Once | INTRAVENOUS | Status: AC
Start: 2020-05-09 — End: 2020-05-09
  Filled 2020-05-09: qty 250

## 2020-05-09 MED ORDER — PALONOSETRON HCL INJECTION 0.25 MG/5ML
0.2500 mg | Freq: Once | INTRAVENOUS | Status: AC
  Administered 2020-05-09: 0.25 mg via INTRAVENOUS
  Filled 2020-05-09: qty 5

## 2020-05-09 MED ORDER — OXALIPLATIN CHEMO INJECTION 100 MG/20ML
85.0000 mg/m2 | Freq: Once | INTRAVENOUS | Status: AC
  Administered 2020-05-09: 165 mg via INTRAVENOUS
  Filled 2020-05-09: qty 33

## 2020-05-09 MED ORDER — LEUCOVORIN CALCIUM INJECTION 350 MG
400.0000 mg/m2 | Freq: Once | INTRAVENOUS | Status: DC
  Administered 2020-05-09: 768 mg via INTRAVENOUS
  Filled 2020-05-09: qty 38.4

## 2020-05-09 MED ORDER — SODIUM CHLORIDE 0.9% FLUSH
10.0000 mL | INTRAVENOUS | Status: DC | PRN
  Filled 2020-05-09: qty 10

## 2020-05-09 MED ORDER — HEPARIN SOD (PORK) LOCK FLUSH 100 UNIT/ML IV SOLN
500.0000 [IU] | Freq: Once | INTRAVENOUS | Status: DC | PRN
  Filled 2020-05-09: qty 5

## 2020-05-09 MED ORDER — SODIUM CHLORIDE 0.9 % IV SOLN
2000.0000 mg/m2 | INTRAVENOUS | Status: DC
  Administered 2020-05-09: 3850 mg via INTRAVENOUS
  Filled 2020-05-09: qty 77

## 2020-05-09 MED ORDER — ATROPINE SULFATE 1 MG/ML IJ SOLN: 0.5000 mg | Freq: Once | INTRAMUSCULAR | Status: DC | PRN

## 2020-05-09 MED ORDER — SODIUM CHLORIDE 0.9 % IV SOLN
150.0000 mg | Freq: Once | INTRAVENOUS | Status: AC
  Administered 2020-05-09: 150 mg via INTRAVENOUS
  Filled 2020-05-09: qty 5

## 2020-05-09 MED ORDER — SODIUM CHLORIDE 0.9 % IV SOLN
150.0000 mg/m2 | Freq: Once | INTRAVENOUS | Status: DC
  Filled 2020-05-09: qty 14

## 2020-05-09 NOTE — Progress Notes (Addendum)
[place] OFFICE PROGRESS NOTE   Diagnosis: Pancreas cancer  INTERVAL HISTORY:   Nathan Mora returns as scheduled.  He is having regular bowel movements.  No diarrhea.  No nausea or vomiting.  Appetite generally is good.  A little diminished over the past few days.  No pain at present.  Over the past weekend he had a "twinge" of pain at the right back.  The pain lasted 5 to 6 hours.  He applied a heating pad.  The pain resolved and has not recurred.  He denies hematuria.  Blood sugar varies.  He has started insulin.  Objective:  Vital signs in last 24 hours:  Blood pressure 120/63, pulse 70, temperature 97.7 F (36.5 C), temperature source Tympanic, resp. rate 18, height 5\' 8"  (1.727 m), weight 166 lb 12.8 oz (75.7 kg), SpO2 100 %.    HEENT: No thrush or ulcers. Resp: Lungs clear bilaterally. Cardio: Regular rate and rhythm. GI: Abdomen soft and nontender.  No hepatomegaly. Vascular: No leg edema. Neuro: Alert and oriented. Skin: No rash. Port-A-Cath without erythema.   Lab Results:  Lab Results  Component Value Date   WBC 8.4 05/09/2020   HGB 12.1 (L) 05/09/2020   HCT 35.5 (L) 05/09/2020   MCV 90.3 05/09/2020   PLT 121 (L) 05/09/2020   NEUTROABS 4.3 05/09/2020    Imaging:  No results found.  Medications: I have reviewed the patient's current medications.  Assessment/Plan: 1. 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 MRI/MRCP 04/05/2020-2.6 x 2.2 cm complex cystic/partially solid lesion in the pancreas head with common bile duct and pancreatic duct obstruction, no evidence for vascular involvement/invasion, no evidence of abdominal metastatic disease ? EUS 04/11/2020-pancreas head mass, 41 x 26 mm with a 13 x 21 mm cystic component, abutment of the portal vein, fine-needle biopsy-adenocarcinoma ? CTs 04/19/2020-heterogenous pancreas head mass measuring 3.3 x 2.9 cm, mass contiguous with the anterior wall the portal splenic confluence with no encasement of  the portal vein or superior mesenteric vein.  Fat planes around the celiac axis preserved.  Less than 90 degree contact of the lateral wall of the SMA, no evidence of metastatic disease, 3 mm left lower lobe pulmonary nodule ? Cycle 1 FOLFOX 05/09/2020 (plan to add irinotecan with cycle 2 pending tolerance) 2. Obstructive jaundice secondary #1  ERCP 04/11/2020-prominent and floppy major papilla, dilated bile duct secondary to a distal stricture, placement of an uncovered metal stent, bile duct brushings-reactive glandular cells 3.   Diabetes  4.   Hyperlipidemia  5.   Glaucoma  6.   Port-A-Cath placement 05/04/2020 Interventional Radiology  7.   Mild thrombocytopenia (121,000) 05/09/2020   Disposition: Nathan Mora appears stable.  He is scheduled to begin neoadjuvant chemotherapy with FOLFIRINOX today.  Irinotecan will be held with this cycle with the plan to add with cycle 2 if he tolerates the chemotherapy well. We again reviewed potential toxicities.  He agrees to proceed.  We reviewed the CBC from today.  Counts are adequate to proceed with treatment.  He has mild anemia and mild thrombocytopenia.  Chemistry panel also reviewed.  Bilirubin improved to 1.9.  Adequate to proceed with FOLFOX today as planned.  He will return for lab, follow-up, cycle 2 FOLFIRINOX in 2 weeks.  He will contact the office in the interim with any problems.  Patient seen with Dr. Benay Spice.    Nathan Mora ANP/GNP-BC   05/09/2020  9:12 AM This was a shared visit with [name].  Nathan Mora  will begin neoadjuvant FOLFIRINOX today.  We reviewed today's labs.  The liver enzymes and bilirubin are improved.  He is using an insulin sliding scale for management of hyperglycemia.  I was present for greater than 50% of today's visit.  I performed medical decision making.  [name], MD

## 2020-05-09 NOTE — Patient Instructions (Addendum)
[facility] Discharge Instructions for Patients Receiving Chemotherapy  Today you received the following chemotherapy agents Oxaliplatin (ELOXATIN), Leucovorin & Flourouracil (ADRUCIL).  To help prevent nausea and vomiting after your treatment, we encourage you to take your nausea medication as prescribed.   If you develop nausea and vomiting that is not controlled by your nausea medication, call the clinic.   BELOW ARE SYMPTOMS THAT SHOULD BE REPORTED IMMEDIATELY:  *FEVER GREATER THAN 100.5 F  *CHILLS WITH OR WITHOUT FEVER  NAUSEA AND VOMITING THAT IS NOT CONTROLLED WITH YOUR NAUSEA MEDICATION  *UNUSUAL SHORTNESS OF BREATH  *UNUSUAL BRUISING OR BLEEDING  TENDERNESS IN MOUTH AND THROAT WITH OR WITHOUT PRESENCE OF ULCERS  *URINARY PROBLEMS  *BOWEL PROBLEMS  UNUSUAL RASH Items with * indicate a potential emergency and should be followed up as soon as possible.  Feel free to call the clinic should you have any questions or concerns at The clinic phone number is [phone].  Please show the [facility] at check-in to the Emergency Department and triage nurse.  Oxaliplatin Injection What is this medicine? OXALIPLATIN (ox AL i PLA tin) is a chemotherapy drug. It targets fast dividing cells, like cancer cells, and causes these cells to die. This medicine is used to treat cancers of the colon and rectum, and many other cancers. This medicine may be used for other purposes; ask your health care provider or pharmacist if you have questions. COMMON BRAND NAME(S): Eloxatin What should I tell my health care provider before I take this medicine? They need to know if you have any of these conditions:  heart disease  history of irregular heartbeat  liver disease  low blood counts, like white cells, platelets, or red blood cells  lung or breathing disease, like asthma  take medicines that treat or prevent blood clots  tingling of the fingers or  toes, or other nerve disorder  an unusual or allergic reaction to oxaliplatin, other chemotherapy, other medicines, foods, dyes, or preservatives  pregnant or trying to get pregnant  breast-feeding How should I use this medicine? This drug is given as an infusion into a vein. It is administered in a hospital or clinic by a specially trained health care professional. Talk to your pediatrician regarding the use of this medicine in children. Special care may be needed. Overdosage: If you think you have taken too much of this medicine contact a poison control center or emergency room at once. NOTE: This medicine is only for you. Do not share this medicine with others. What if I miss a dose? It is important not to miss a dose. Call your doctor or health care professional if you are unable to keep an appointment. What may interact with this medicine? Do not take this medicine with any of the following medications:  cisapride  dronedarone  pimozide  thioridazine This medicine may also interact with the following medications:  aspirin and aspirin-like medicines  certain medicines that treat or prevent blood clots like warfarin, apixaban, dabigatran, and rivaroxaban  cisplatin  cyclosporine  diuretics  medicines for infection like acyclovir, adefovir, amphotericin B, bacitracin, cidofovir, foscarnet, ganciclovir, gentamicin, pentamidine, vancomycin  NSAIDs, medicines for pain and inflammation, like ibuprofen or naproxen  other medicines that prolong the QT interval (an abnormal heart rhythm)  pamidronate  zoledronic acid This list may not describe all possible interactions. Give your health care provider a list of all the medicines, herbs, non-prescription drugs, or dietary supplements you use. Also tell  them if you smoke, drink alcohol, or use illegal drugs. Some items may interact with your medicine. What should I watch for while using this medicine? Your condition will be  monitored carefully while you are receiving this medicine. You may need blood work done while you are taking this medicine. This medicine may make you feel generally unwell. This is not uncommon as chemotherapy can affect healthy cells as well as cancer cells. Report any side effects. Continue your course of treatment even though you feel ill unless your healthcare professional tells you to stop. This medicine can make you more sensitive to cold. Do not drink cold drinks or use ice. Cover exposed skin before coming in contact with cold temperatures or cold objects. When out in cold weather wear warm clothing and cover your mouth and nose to warm the air that goes into your lungs. Tell your doctor if you get sensitive to the cold. Do not become pregnant while taking this medicine or for 9 months after stopping it. Women should inform their health care professional if they wish to become pregnant or think they might be pregnant. Men should not father a child while taking this medicine and for 6 months after stopping it. There is potential for serious side effects to an unborn child. Talk to your health care professional for more information. Do not breast-feed a child while taking this medicine or for 3 months after stopping it. This medicine has caused ovarian failure in some women. This medicine may make it more difficult to get pregnant. Talk to your health care professional if you are concerned about your fertility. This medicine has caused decreased sperm counts in some men. This may make it more difficult to father a child. Talk to your health care professional if you are concerned about your fertility. This medicine may increase your risk of getting an infection. Call your health care professional for advice if you get a fever, chills, or sore throat, or other symptoms of a cold or flu. Do not treat yourself. Try to avoid being around people who are sick. Avoid taking medicines that contain aspirin,  acetaminophen, ibuprofen, naproxen, or ketoprofen unless instructed by your health care professional. These medicines may hide a fever. Be careful brushing or flossing your teeth or using a toothpick because you may get an infection or bleed more easily. If you have any dental work done, tell your dentist you are receiving this medicine. What side effects may I notice from receiving this medicine? Side effects that you should report to your doctor or health care professional as soon as possible:  allergic reactions like skin rash, itching or hives, swelling of the face, lips, or tongue  breathing problems  cough  low blood counts - this medicine may decrease the number of white blood cells, red blood cells, and platelets. You may be at increased risk for infections and bleeding  nausea, vomiting  pain, redness, or irritation at site where injected  pain, tingling, numbness in the hands or feet  signs and symptoms of bleeding such as bloody or black, tarry stools; red or dark brown urine; spitting up blood or brown material that looks like coffee grounds; red spots on the skin; unusual bruising or bleeding from the eyes, gums, or nose  signs and symptoms of a dangerous change in heartbeat or heart rhythm like chest pain; dizziness; fast, irregular heartbeat; palpitations; feeling faint or lightheaded; falls  signs and symptoms of infection like fever; chills; cough; sore throat;  pain or trouble passing urine  signs and symptoms of liver injury like dark yellow or brown urine; general ill feeling or flu-like symptoms; light-colored stools; loss of appetite; nausea; right upper belly pain; unusually weak or tired; yellowing of the eyes or skin  signs and symptoms of low red blood cells or anemia such as unusually weak or tired; feeling faint or lightheaded; falls  signs and symptoms of muscle injury like dark urine; trouble passing urine or change in the amount of urine; unusually weak or  tired; muscle pain; back pain Side effects that usually do not require medical attention (report to your doctor or health care professional if they continue or are bothersome):  changes in taste  diarrhea  gas  hair loss  loss of appetite  mouth sores This list may not describe all possible side effects. Call your doctor for medical advice about side effects. You may report side effects to FDA at 1-800-FDA-1088. Where should I keep my medicine? This drug is given in a hospital or clinic and will not be stored at home. NOTE: This sheet is a summary. It may not cover all possible information. If you have questions about this medicine, talk to your doctor, pharmacist, or health care provider.  2021 Elsevier/Gold Standard (2018-06-02 12:20:35)  Leucovorin injection What is this medicine? LEUCOVORIN (loo koe VOR in) is used to prevent or treat the harmful effects of some medicines. This medicine is used to treat anemia caused by a low amount of folic acid in the body. It is also used with 5-fluorouracil (5-FU) to treat colon cancer. This medicine may be used for other purposes; ask your health care provider or pharmacist if you have questions. What should I tell my health care provider before I take this medicine? They need to know if you have any of these conditions:  anemia from low levels of vitamin B-12 in the blood  an unusual or allergic reaction to leucovorin, folic acid, other medicines, foods, dyes, or preservatives  pregnant or trying to get pregnant  breast-feeding How should I use this medicine? This medicine is for injection into a muscle or into a vein. It is given by a health care professional in a hospital or clinic setting. Talk to your pediatrician regarding the use of this medicine in children. Special care may be needed. Overdosage: If you think you have taken too much of this medicine contact a poison control center or emergency room at once. NOTE: This medicine  is only for you. Do not share this medicine with others. What if I miss a dose? This does not apply. What may interact with this medicine?  capecitabine  fluorouracil  phenobarbital  phenytoin  primidone  trimethoprim-sulfamethoxazole This list may not describe all possible interactions. Give your health care provider a list of all the medicines, herbs, non-prescription drugs, or dietary supplements you use. Also tell them if you smoke, drink alcohol, or use illegal drugs. Some items may interact with your medicine. What should I watch for while using this medicine? Your condition will be monitored carefully while you are receiving this medicine. This medicine may increase the side effects of 5-fluorouracil, 5-FU. Tell your doctor or health care professional if you have diarrhea or mouth sores that do not get better or that get worse. What side effects may I notice from receiving this medicine? Side effects that you should report to your doctor or health care professional as soon as possible:  allergic reactions like skin rash, itching  or hives, swelling of the face, lips, or tongue  breathing problems  fever, infection  mouth sores  unusual bleeding or bruising  unusually weak or tired Side effects that usually do not require medical attention (report to your doctor or health care professional if they continue or are bothersome):  constipation or diarrhea  loss of appetite  nausea, vomiting This list may not describe all possible side effects. Call your doctor for medical advice about side effects. You may report side effects to FDA at 1-800-FDA-1088. Where should I keep my medicine? This drug is given in a hospital or clinic and will not be stored at home. NOTE: This sheet is a summary. It may not cover all possible information. If you have questions about this medicine, talk to your doctor, pharmacist, or health care provider.  2021 Elsevier/Gold Standard (2007-07-20  16:50:29)  Fluorouracil, 5-FU injection What is this medicine? FLUOROURACIL, 5-FU (flure oh YOOR a sil) is a chemotherapy drug. It slows the growth of cancer cells. This medicine is used to treat many types of cancer like breast cancer, colon or rectal cancer, pancreatic cancer, and stomach cancer. This medicine may be used for other purposes; ask your health care provider or pharmacist if you have questions. COMMON BRAND NAME(S): Adrucil What should I tell my health care provider before I take this medicine? They need to know if you have any of these conditions:  blood disorders  dihydropyrimidine dehydrogenase (DPD) deficiency  infection (especially a virus infection such as chickenpox, cold sores, or herpes)  kidney disease  liver disease  malnourished, poor nutrition  recent or ongoing radiation therapy  an unusual or allergic reaction to fluorouracil, other chemotherapy, other medicines, foods, dyes, or preservatives  pregnant or trying to get pregnant  breast-feeding How should I use this medicine? This drug is given as an infusion or injection into a vein. It is administered in a hospital or clinic by a specially trained health care professional. Talk to your pediatrician regarding the use of this medicine in children. Special care may be needed. Overdosage: If you think you have taken too much of this medicine contact a poison control center or emergency room at once. NOTE: This medicine is only for you. Do not share this medicine with others. What if I miss a dose? It is important not to miss your dose. Call your doctor or health care professional if you are unable to keep an appointment. What may interact with this medicine? Do not take this medicine with any of the following medications:  live virus vaccines This medicine may also interact with the following medications:  medicines that treat or prevent blood clots like warfarin, enoxaparin, and dalteparin This  list may not describe all possible interactions. Give your health care provider a list of all the medicines, herbs, non-prescription drugs, or dietary supplements you use. Also tell them if you smoke, drink alcohol, or use illegal drugs. Some items may interact with your medicine. What should I watch for while using this medicine? Visit your doctor for checks on your progress. This drug may make you feel generally unwell. This is not uncommon, as chemotherapy can affect healthy cells as well as cancer cells. Report any side effects. Continue your course of treatment even though you feel ill unless your doctor tells you to stop. In some cases, you may be given additional medicines to help with side effects. Follow all directions for their use. Call your doctor or health care professional for advice if  you get a fever, chills or sore throat, or other symptoms of a cold or flu. Do not treat yourself. This drug decreases your body's ability to fight infections. Try to avoid being around people who are sick. This medicine may increase your risk to bruise or bleed. Call your doctor or health care professional if you notice any unusual bleeding. Be careful brushing and flossing your teeth or using a toothpick because you may get an infection or bleed more easily. If you have any dental work done, tell your dentist you are receiving this medicine. Avoid taking products that contain aspirin, acetaminophen, ibuprofen, naproxen, or ketoprofen unless instructed by your doctor. These medicines may hide a fever. Do not become pregnant while taking this medicine. Women should inform their doctor if they wish to become pregnant or think they might be pregnant. There is a potential for serious side effects to an unborn child. Talk to your health care professional or pharmacist for more information. Do not breast-feed an infant while taking this medicine. Men should inform their doctor if they wish to father a child. This  medicine may lower sperm counts. Do not treat diarrhea with over the counter products. Contact your doctor if you have diarrhea that lasts more than 2 days or if it is severe and watery. This medicine can make you more sensitive to the sun. Keep out of the sun. If you cannot avoid being in the sun, wear protective clothing and use sunscreen. Do not use sun lamps or tanning beds/booths. What side effects may I notice from receiving this medicine? Side effects that you should report to your doctor or health care professional as soon as possible:  allergic reactions like skin rash, itching or hives, swelling of the face, lips, or tongue  low blood counts - this medicine may decrease the number of white blood cells, red blood cells and platelets. You may be at increased risk for infections and bleeding.  signs of infection - fever or chills, cough, sore throat, pain or difficulty passing urine  signs of decreased platelets or bleeding - bruising, pinpoint red spots on the skin, black, tarry stools, blood in the urine  signs of decreased red blood cells - unusually weak or tired, fainting spells, lightheadedness  breathing problems  changes in vision  chest pain  mouth sores  nausea and vomiting  pain, swelling, redness at site where injected  pain, tingling, numbness in the hands or feet  redness, swelling, or sores on hands or feet  stomach pain  unusual bleeding Side effects that usually do not require medical attention (report to your doctor or health care professional if they continue or are bothersome):  changes in finger or toe nails  diarrhea  dry or itchy skin  hair loss  headache  loss of appetite  sensitivity of eyes to the light  stomach upset  unusually teary eyes This list may not describe all possible side effects. Call your doctor for medical advice about side effects. You may report side effects to FDA at 1-800-FDA-1088. Where should I keep my  medicine? This drug is given in a hospital or clinic and will not be stored at home. NOTE: This sheet is a summary. It may not cover all possible information. If you have questions about this medicine, talk to your doctor, pharmacist, or health care provider.  2021 Elsevier/Gold Standard (2018-12-14 15:00:03)

## 2020-05-09 NOTE — Telephone Encounter (Signed)
Scheduled appt per 4/13 los - pt to get an updated schedule in infusion

## 2020-05-09 NOTE — Progress Notes (Signed)
Hold Irinotecan per RN.  Was released so pharmacy removed from [facility] and altered Leucovorin to be in Dextrose and infuse over 2 hours.  Henreitta Leber, PharmD

## 2020-05-10 ENCOUNTER — Other Ambulatory Visit: Payer: Self-pay | Admitting: Oncology

## 2020-05-10 ENCOUNTER — Ambulatory Visit: Payer: Medicare HMO | Admitting: Endocrinology

## 2020-05-10 ENCOUNTER — Telehealth: Payer: Self-pay | Admitting: *Deleted

## 2020-05-11 ENCOUNTER — Other Ambulatory Visit: Payer: Self-pay

## 2020-05-11 ENCOUNTER — Inpatient Hospital Stay: Payer: Medicare HMO

## 2020-05-11 ENCOUNTER — Other Ambulatory Visit: Payer: Self-pay | Admitting: Nurse Practitioner

## 2020-05-11 VITALS — BP 110/69 | HR 59 | Temp 97.9°F | Resp 18

## 2020-05-11 DIAGNOSIS — D696 Thrombocytopenia, unspecified: Secondary | ICD-10-CM | POA: Diagnosis not present

## 2020-05-11 DIAGNOSIS — R110 Nausea: Secondary | ICD-10-CM

## 2020-05-11 DIAGNOSIS — K831 Obstruction of bile duct: Secondary | ICD-10-CM | POA: Diagnosis not present

## 2020-05-11 DIAGNOSIS — C250 Malignant neoplasm of head of pancreas: Secondary | ICD-10-CM

## 2020-05-11 DIAGNOSIS — H409 Unspecified glaucoma: Secondary | ICD-10-CM | POA: Diagnosis not present

## 2020-05-11 DIAGNOSIS — E785 Hyperlipidemia, unspecified: Secondary | ICD-10-CM | POA: Diagnosis not present

## 2020-05-11 DIAGNOSIS — E119 Type 2 diabetes mellitus without complications: Secondary | ICD-10-CM | POA: Diagnosis not present

## 2020-05-11 DIAGNOSIS — Z5111 Encounter for antineoplastic chemotherapy: Secondary | ICD-10-CM | POA: Diagnosis not present

## 2020-05-11 DIAGNOSIS — Z794 Long term (current) use of insulin: Secondary | ICD-10-CM | POA: Diagnosis not present

## 2020-05-11 MED ORDER — LORAZEPAM 2 MG/ML IJ SOLN
INTRAMUSCULAR | Status: AC
  Filled 2020-05-11: qty 1

## 2020-05-11 MED ORDER — SODIUM CHLORIDE 0.9% FLUSH
10.0000 mL | INTRAVENOUS | Status: DC | PRN
  Administered 2020-05-11: 10 mL
  Filled 2020-05-11: qty 10

## 2020-05-11 MED ORDER — LORAZEPAM 2 MG/ML IJ SOLN
0.5000 mg | Freq: Once | INTRAMUSCULAR | Status: AC
Start: 2020-05-11 — End: 2020-05-11
  Administered 2020-05-11: 0.5 mg via INTRAVENOUS

## 2020-05-11 MED ORDER — LORAZEPAM 1 MG PO TABS: 0.5000 mg | ORAL_TABLET | Freq: Once | ORAL | Status: DC

## 2020-05-11 MED ORDER — SODIUM CHLORIDE 0.9 % IV SOLN
INTRAVENOUS | Status: DC
  Filled 2020-05-11 (×2): qty 250

## 2020-05-11 MED ORDER — ONDANSETRON HCL 8 MG PO TABS: 8.0000 mg | ORAL_TABLET | Freq: Three times a day (TID) | ORAL | 0 refills | Status: DC | PRN

## 2020-05-11 MED ORDER — LORAZEPAM 0.5 MG PO TABS: 0.5000 mg | ORAL_TABLET | Freq: Three times a day (TID) | ORAL | 0 refills | Status: DC | PRN

## 2020-05-11 MED ORDER — HEPARIN SOD (PORK) LOCK FLUSH 100 UNIT/ML IV SOLN
500.0000 [IU] | Freq: Once | INTRAVENOUS | Status: AC | PRN
  Administered 2020-05-11: 500 [IU]
  Filled 2020-05-11: qty 5

## 2020-05-11 NOTE — Progress Notes (Signed)
Mr. Persky presents today for pump discontinuation.  He has been having nausea.  Compazine partially effective.  Blood sugars elevated.  His wife reports she contacted endocrinology this morning and has a new sliding scale.  He is receiving a liter of IV fluids.  He was given Ativan 0.5 mg IV x1 and at present is resting comfortably.  A prescription was sent to his pharmacy for Ativan 0.5 mg every 8 hours as needed for nausea or anxiety.  Prescription also sent to his pharmacy for Zofran 8 mg every 8 hours as needed for nausea/vomiting, do not begin until 72 hours after day 1 chemotherapy.  I discussed the importance of maintaining adequate hydration with his wife, as well as close monitoring of blood sugars.  They will follow-up as scheduled but understand we are available prior to the next visit if needed.  They will contact endocrinology with persistent elevated blood sugar readings.

## 2020-05-11 NOTE — Progress Notes (Signed)
Wife returned with pt glucometer and checked BS - 279.  4 units SQ insulin given in left abdomen by wife. Pt resting comfortably.  1420- IVF done. PT states he feels better. Did eat some grapes and drank juice. Wife driving pt home. PT ambulated to bathroom without difficulty.   Wife instructed on pump and PAC care. verbalized understanding and all questions answered

## 2020-05-17 ENCOUNTER — Telehealth: Payer: Self-pay | Admitting: *Deleted

## 2020-05-18 LAB — CANCER ANTIGEN 19-9: CA 19-9: 1320 U/mL — ABNORMAL HIGH (ref 0–35)

## 2020-05-20 ENCOUNTER — Other Ambulatory Visit: Payer: Self-pay | Admitting: Oncology

## 2020-05-21 ENCOUNTER — Telehealth: Payer: Self-pay | Admitting: *Deleted

## 2020-05-22 DIAGNOSIS — E1165 Type 2 diabetes mellitus with hyperglycemia: Secondary | ICD-10-CM | POA: Diagnosis not present

## 2020-05-22 DIAGNOSIS — Z794 Long term (current) use of insulin: Secondary | ICD-10-CM | POA: Diagnosis not present

## 2020-05-23 ENCOUNTER — Other Ambulatory Visit: Payer: Medicare HMO

## 2020-05-23 ENCOUNTER — Inpatient Hospital Stay (HOSPITAL_BASED_OUTPATIENT_CLINIC_OR_DEPARTMENT_OTHER): Payer: Medicare HMO | Admitting: Oncology

## 2020-05-23 ENCOUNTER — Inpatient Hospital Stay: Payer: Medicare HMO

## 2020-05-23 ENCOUNTER — Other Ambulatory Visit: Payer: Self-pay

## 2020-05-23 ENCOUNTER — Other Ambulatory Visit: Payer: Self-pay | Admitting: Oncology

## 2020-05-23 ENCOUNTER — Ambulatory Visit: Payer: Medicare HMO

## 2020-05-23 ENCOUNTER — Ambulatory Visit: Payer: Medicare HMO | Admitting: Oncology

## 2020-05-23 VITALS — BP 124/80 | HR 73 | Temp 97.8°F | Resp 20 | Ht 68.0 in | Wt 167.4 lb

## 2020-05-23 DIAGNOSIS — C250 Malignant neoplasm of head of pancreas: Secondary | ICD-10-CM

## 2020-05-23 DIAGNOSIS — Z5111 Encounter for antineoplastic chemotherapy: Secondary | ICD-10-CM | POA: Diagnosis not present

## 2020-05-23 DIAGNOSIS — E785 Hyperlipidemia, unspecified: Secondary | ICD-10-CM | POA: Diagnosis not present

## 2020-05-23 DIAGNOSIS — E119 Type 2 diabetes mellitus without complications: Secondary | ICD-10-CM | POA: Diagnosis not present

## 2020-05-23 DIAGNOSIS — Z794 Long term (current) use of insulin: Secondary | ICD-10-CM | POA: Diagnosis not present

## 2020-05-23 DIAGNOSIS — K831 Obstruction of bile duct: Secondary | ICD-10-CM | POA: Diagnosis not present

## 2020-05-23 DIAGNOSIS — D696 Thrombocytopenia, unspecified: Secondary | ICD-10-CM | POA: Diagnosis not present

## 2020-05-23 DIAGNOSIS — H409 Unspecified glaucoma: Secondary | ICD-10-CM | POA: Diagnosis not present

## 2020-05-23 LAB — CBC WITH DIFFERENTIAL (CANCER CENTER ONLY)
Abs Immature Granulocytes: 0.02 10*3/uL (ref 0.00–0.07)
Basophils Absolute: 0 10*3/uL (ref 0.0–0.1)
Basophils Relative: 0 %
Eosinophils Absolute: 0 10*3/uL (ref 0.0–0.5)
Eosinophils Relative: 0 %
HCT: 37.5 % — ABNORMAL LOW (ref 39.0–52.0)
Hemoglobin: 13 g/dL (ref 13.0–17.0)
Immature Granulocytes: 0 %
Lymphocytes Relative: 48 %
Lymphs Abs: 2.9 10*3/uL (ref 0.7–4.0)
MCH: 31.6 pg (ref 26.0–34.0)
MCHC: 34.7 g/dL (ref 30.0–36.0)
MCV: 91.2 fL (ref 80.0–100.0)
Monocytes Absolute: 0.9 10*3/uL (ref 0.1–1.0)
Monocytes Relative: 16 %
Neutro Abs: 2.1 10*3/uL (ref 1.7–7.7)
Neutrophils Relative %: 36 %
Platelet Count: 150 10*3/uL (ref 150–400)
RBC: 4.11 MIL/uL — ABNORMAL LOW (ref 4.22–5.81)
RDW: 14.9 % (ref 11.5–15.5)
WBC Count: 6 10*3/uL (ref 4.0–10.5)
nRBC: 0 % (ref 0.0–0.2)

## 2020-05-23 LAB — CMP (CANCER CENTER ONLY)
ALT: 19 U/L (ref 0–44)
AST: 18 U/L (ref 15–41)
Albumin: 4.1 g/dL (ref 3.5–5.0)
Alkaline Phosphatase: 112 U/L (ref 38–126)
Anion gap: 7 (ref 5–15)
BUN: 15 mg/dL (ref 8–23)
CO2: 27 mmol/L (ref 22–32)
Calcium: 8.9 mg/dL (ref 8.9–10.3)
Chloride: 103 mmol/L (ref 98–111)
Creatinine: 0.63 mg/dL (ref 0.61–1.24)
GFR, Estimated: >60 mL/min (ref >60)
Glucose, Bld: 148 mg/dL — ABNORMAL HIGH (ref 70–99)
Potassium: 4.2 mmol/L (ref 3.5–5.1)
Sodium: 137 mmol/L (ref 135–145)
Total Bilirubin: 1.1 mg/dL (ref 0.3–1.2)
Total Protein: 6.1 g/dL — ABNORMAL LOW (ref 6.5–8.1)

## 2020-05-23 MED ORDER — PALONOSETRON HCL INJECTION 0.25 MG/5ML
0.2500 mg | Freq: Once | INTRAVENOUS | Status: AC
  Administered 2020-05-23: 0.25 mg via INTRAVENOUS
  Filled 2020-05-23: qty 5

## 2020-05-23 MED ORDER — SODIUM CHLORIDE 0.9 % IV SOLN
2000.0000 mg/m2 | INTRAVENOUS | Status: DC
  Administered 2020-05-23: 3850 mg via INTRAVENOUS
  Filled 2020-05-23: qty 77

## 2020-05-23 MED ORDER — SODIUM CHLORIDE 0.9 % IV SOLN
400.0000 mg/m2 | Freq: Once | INTRAVENOUS | Status: AC
  Administered 2020-05-23: 768 mg via INTRAVENOUS
  Filled 2020-05-23: qty 38.4

## 2020-05-23 MED ORDER — OXALIPLATIN CHEMO INJECTION 100 MG/20ML
85.0000 mg/m2 | Freq: Once | INTRAVENOUS | Status: AC
  Administered 2020-05-23: 165 mg via INTRAVENOUS
  Filled 2020-05-23: qty 33

## 2020-05-23 MED ORDER — SODIUM CHLORIDE 0.9 % IV SOLN
150.0000 mg | Freq: Once | INTRAVENOUS | Status: AC
  Administered 2020-05-23: 150 mg via INTRAVENOUS
  Filled 2020-05-23: qty 5

## 2020-05-23 MED ORDER — SODIUM CHLORIDE 0.9 % IV SOLN
10.0000 mg | Freq: Once | INTRAVENOUS | Status: AC
  Administered 2020-05-23: 10 mg via INTRAVENOUS
  Filled 2020-05-23: qty 1

## 2020-05-23 MED ORDER — DEXTROSE 5 % IV SOLN
Freq: Once | INTRAVENOUS | Status: AC
  Filled 2020-05-23: qty 250

## 2020-05-23 MED ORDER — SODIUM CHLORIDE 0.9 % IV SOLN
150.0000 mg/m2 | Freq: Once | INTRAVENOUS | Status: AC
  Administered 2020-05-23: 280 mg via INTRAVENOUS
  Filled 2020-05-23: qty 14

## 2020-05-23 NOTE — Progress Notes (Signed)
[facility] OFFICE PROGRESS NOTE   Diagnosis: Pancreas cancer  INTERVAL HISTORY:   Mr Muto completed cycle 1 FOLFIRINOX on 05/09/2020.  He had malaise beginning on the evening of day 2.  He denies nausea and vomiting, mouth sores, diarrhea, and peripheral numbness.  He received intravenous fluids on 05/11/2020 after presenting with profound weakness.  He reports feeling much better after being placed on insulin for diabetes.  He is now using a diabetic monitor and his blood sugar is under better control.  He feels well at present.  Objective:  Vital signs in last 24 hours:  Blood pressure 124/80, pulse 73, temperature 97.8 F (36.6 C), temperature source Oral, resp. rate 20, height 5\' 8"  (1.727 m), weight 167 lb 6.4 oz (75.9 kg), SpO2 99 %.    HEENT: No thrush or ulcers Resp: Lungs clear bilaterally Cardio: Regular rate and rhythm GI: Nontender, no mass, no hepatosplenomegaly Vascular: No leg edema  Skin: Palms without erythema  Portacath/PICC-without erythema  Lab Results:  Lab Results  Component Value Date   WBC 6.0 05/23/2020   HGB 13.0 05/23/2020   HCT 37.5 (L) 05/23/2020   MCV 91.2 05/23/2020   PLT 150 05/23/2020   NEUTROABS 2.1 05/23/2020    CMP  Lab Results  Component Value Date   NA 137 05/23/2020   K 4.2 05/23/2020   CL 103 05/23/2020   CO2 27 05/23/2020   GLUCOSE 148 (H) 05/23/2020   BUN 15 05/23/2020   CREATININE 0.63 05/23/2020   CALCIUM 8.9 05/23/2020   PROT 6.1 (L) 05/23/2020   ALBUMIN 4.1 05/23/2020   AST 18 05/23/2020   ALT 19 05/23/2020   ALKPHOS 112 05/23/2020   BILITOT 1.1 05/23/2020   GFRNONAA >60 05/23/2020   GFRAA 99 12/21/2019     Medications: I have reviewed the patient's current medications.   Assessment/Plan:  1. 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 MRI/MRCP 04/05/2020-2.6 x 2.2 cm complex cystic/partially solid lesion in the pancreas head with common bile duct and pancreatic duct obstruction, no evidence for  vascular involvement/invasion, no evidence of abdominal metastatic disease ? EUS 04/11/2020-pancreas head mass, 41 x 26 mm with a 13 x 21 mm cystic component, abutment of the portal vein, fine-needle biopsy-adenocarcinoma ? CTs 04/19/2020-heterogenous pancreas head mass measuring 3.3 x 2.9 cm, mass contiguous with the anterior wall the portal splenic confluence with no encasement of the portal vein or superior mesenteric vein.  Fat planes around the celiac axis preserved.  Less than 90 degree contact of the lateral wall of the SMA, no evidence of metastatic disease, 3 mm left lower lobe pulmonary nodule ? Cycle 1 FOLFOX 05/09/2020 (plan to add irinotecan with cycle 2 pending tolerance) ? Cycle 2 FOLFIRINOX 05/23/2020 2. Obstructive jaundice secondary #1  ERCP 04/11/2020-prominent and floppy major papilla, dilated bile duct secondary to a distal stricture, placement of an uncovered metal stent, bile duct brushings-reactive glandular cells 3.   Diabetes  4.   Hyperlipidemia  5.   Glaucoma  6.   Port-A-Cath placement 05/04/2020 Interventional Radiology  7.   Mild thrombocytopenia (121,000) 05/09/2020    Disposition: Mr Tilly completed cycle 1 FOLFOX on 05/09/2020.  He developed failure to thrive and dehydration and received intravenous fluids on 05/11/2020.  He feels well at present.  His blood sugar is now under better control with an insulin regimen.  The plan is to proceed with cycle 2 FOLFIRINOX today.  He will call for nausea or weakness following this cycle of chemotherapy.  Mr. Schlageter will return for an office visit and the next cycle of chemotherapy in 2 weeks.  [name], MD  05/23/2020  11:46 AM

## 2020-05-23 NOTE — Patient Instructions (Signed)
[facility]  Discharge Instructions: Thank you for choosing [facility] to provide your oncology and hematology care.   If you have a lab appointment with the Cancer Center, please go directly to the Cancer Center and check in at the registration area.   Wear comfortable clothing and clothing appropriate for easy access to any Portacath or PICC line.   We strive to give you quality time with your provider. You may need to reschedule your appointment if you arrive late (15 or more minutes).  Arriving late affects you and other patients whose appointments are after yours.  Also, if you miss three or more appointments without notifying the office, you may be dismissed from the clinic at the provider's discretion.      For prescription refill requests, have your pharmacy contact our office and allow 72 hours for refills to be completed.    Today you received the following chemotherapy and/or immunotherapy agents Oxaliplatin, Irinotecan, Leucovorin, and 5FU      To help prevent nausea and vomiting after your treatment, we encourage you to take your nausea medication as directed.  BELOW ARE SYMPTOMS THAT SHOULD BE REPORTED IMMEDIATELY: *FEVER GREATER THAN 100.4 F (38 C) OR HIGHER *CHILLS OR SWEATING *NAUSEA AND VOMITING THAT IS NOT CONTROLLED WITH YOUR NAUSEA MEDICATION *UNUSUAL SHORTNESS OF BREATH *UNUSUAL BRUISING OR BLEEDING *URINARY PROBLEMS (pain or burning when urinating, or frequent urination) *BOWEL PROBLEMS (unusual diarrhea, constipation, pain near the anus) TENDERNESS IN MOUTH AND THROAT WITH OR WITHOUT PRESENCE OF ULCERS (sore throat, sores in mouth, or a toothache) UNUSUAL RASH, SWELLING OR PAIN  UNUSUAL VAGINAL DISCHARGE OR ITCHING   Items with * indicate a potential emergency and should be followed up as soon as possible or go to the Emergency Department if any problems should occur.  Please show the CHEMOTHERAPY ALERT CARD or  IMMUNOTHERAPY ALERT CARD at check-in to the Emergency Department and triage nurse.  Should you have questions after your visit or need to cancel or reschedule your appointment, please contact [facility]  Dept: [phone]  and follow the prompts.  Office hours are 8:00 a.m. to 4:30 p.m. Monday - Friday. Please note that voicemails left after 4:00 p.m. may not be returned until the following business day.  We are closed weekends and major holidays. You have access to a nurse at all times for urgent questions. Please call the main number to the clinic Dept: [phone] and follow the prompts.   For any non-urgent questions, you may also contact your provider using MyChart. We now offer e-Visits for anyone 18 and older to request care online for non-urgent symptoms. For details visit [email or web address].   Also download the MyChart app! Go to the app store, search "MyChart", open the app, select [facility], and log in with your MyChart username and password.  Due to Covid, a mask is required upon entering the hospital/clinic. If you do not have a mask, one will be given to you upon arrival. For doctor visits, patients may have 1 support person aged 18 or older with them. For treatment visits, patients cannot have anyone with them due to current Covid guidelines and our immunocompromised population.   

## 2020-05-23 NOTE — Progress Notes (Signed)
Nathan Mora presents today for D1C2 FOLFIRINOX. Pt denies any new changes or symptoms since last treatment. Lab results and vitals have been reviewed and are stable and within parameters for treatment. Patient has been assessed by Dr. Benay Spice who has approved proceeding with treatment today as planned.  Infusions tolerated without incident or complaint. VSS upon completion of treatment. 5FU infusing via home infusion pump through port without difficulties, see MAR and IV flowsheet for details. Discharged in satisfactory condition with follow up instructions.

## 2020-05-25 ENCOUNTER — Inpatient Hospital Stay: Payer: Medicare HMO

## 2020-06-03 ENCOUNTER — Other Ambulatory Visit: Payer: Self-pay | Admitting: Oncology

## 2020-06-06 ENCOUNTER — Inpatient Hospital Stay: Payer: Medicare HMO | Attending: Oncology

## 2020-06-06 ENCOUNTER — Other Ambulatory Visit: Payer: Self-pay

## 2020-06-06 ENCOUNTER — Inpatient Hospital Stay (HOSPITAL_BASED_OUTPATIENT_CLINIC_OR_DEPARTMENT_OTHER): Payer: Medicare HMO | Admitting: Oncology

## 2020-06-06 ENCOUNTER — Inpatient Hospital Stay: Payer: Medicare HMO

## 2020-06-06 VITALS — BP 115/73 | HR 76 | Temp 97.8°F | Resp 18 | Ht 68.0 in | Wt 169.6 lb

## 2020-06-06 DIAGNOSIS — E785 Hyperlipidemia, unspecified: Secondary | ICD-10-CM | POA: Diagnosis not present

## 2020-06-06 DIAGNOSIS — D696 Thrombocytopenia, unspecified: Secondary | ICD-10-CM | POA: Insufficient documentation

## 2020-06-06 DIAGNOSIS — Z794 Long term (current) use of insulin: Secondary | ICD-10-CM | POA: Insufficient documentation

## 2020-06-06 DIAGNOSIS — C250 Malignant neoplasm of head of pancreas: Secondary | ICD-10-CM

## 2020-06-06 DIAGNOSIS — E119 Type 2 diabetes mellitus without complications: Secondary | ICD-10-CM | POA: Insufficient documentation

## 2020-06-06 DIAGNOSIS — K831 Obstruction of bile duct: Secondary | ICD-10-CM | POA: Diagnosis not present

## 2020-06-06 DIAGNOSIS — H409 Unspecified glaucoma: Secondary | ICD-10-CM | POA: Diagnosis not present

## 2020-06-06 DIAGNOSIS — Z5111 Encounter for antineoplastic chemotherapy: Secondary | ICD-10-CM | POA: Diagnosis not present

## 2020-06-06 LAB — CMP (CANCER CENTER ONLY)
ALT: 19 U/L (ref 0–44)
AST: 15 U/L (ref 15–41)
Albumin: 3.9 g/dL (ref 3.5–5.0)
Alkaline Phosphatase: 81 U/L (ref 38–126)
Anion gap: 7 (ref 5–15)
BUN: 16 mg/dL (ref 8–23)
CO2: 28 mmol/L (ref 22–32)
Calcium: 8.7 mg/dL — ABNORMAL LOW (ref 8.9–10.3)
Chloride: 103 mmol/L (ref 98–111)
Creatinine: 0.75 mg/dL (ref 0.61–1.24)
GFR, Estimated: >60 mL/min (ref >60)
Glucose, Bld: 127 mg/dL — ABNORMAL HIGH (ref 70–99)
Potassium: 4.3 mmol/L (ref 3.5–5.1)
Sodium: 138 mmol/L (ref 135–145)
Total Bilirubin: 0.8 mg/dL (ref 0.3–1.2)
Total Protein: 6.2 g/dL — ABNORMAL LOW (ref 6.5–8.1)

## 2020-06-06 LAB — CBC WITH DIFFERENTIAL (CANCER CENTER ONLY)
Abs Immature Granulocytes: 0.03 10*3/uL (ref 0.00–0.07)
Basophils Absolute: 0 10*3/uL (ref 0.0–0.1)
Basophils Relative: 0 %
Eosinophils Absolute: 0 10*3/uL (ref 0.0–0.5)
Eosinophils Relative: 0 %
HCT: 39 % (ref 39.0–52.0)
Hemoglobin: 13.1 g/dL (ref 13.0–17.0)
Immature Granulocytes: 1 %
Lymphocytes Relative: 44 %
Lymphs Abs: 2.5 10*3/uL (ref 0.7–4.0)
MCH: 30.8 pg (ref 26.0–34.0)
MCHC: 33.6 g/dL (ref 30.0–36.0)
MCV: 91.5 fL (ref 80.0–100.0)
Monocytes Absolute: 1 10*3/uL (ref 0.1–1.0)
Monocytes Relative: 18 %
Neutro Abs: 2.1 10*3/uL (ref 1.7–7.7)
Neutrophils Relative %: 37 %
Platelet Count: 86 10*3/uL — ABNORMAL LOW (ref 150–400)
RBC: 4.26 MIL/uL (ref 4.22–5.81)
RDW: 14.2 % (ref 11.5–15.5)
WBC Count: 5.7 10*3/uL (ref 4.0–10.5)
nRBC: 0 % (ref 0.0–0.2)

## 2020-06-06 MED ORDER — SODIUM CHLORIDE 0.9% FLUSH
10.0000 mL | INTRAVENOUS | Status: DC | PRN
  Filled 2020-06-06: qty 10

## 2020-06-06 MED ORDER — LEUCOVORIN CALCIUM INJECTION 350 MG
400.0000 mg/m2 | Freq: Once | INTRAMUSCULAR | Status: AC
  Administered 2020-06-06: 768 mg via INTRAVENOUS
  Filled 2020-06-06: qty 38.4

## 2020-06-06 MED ORDER — DEXTROSE 5 % IV SOLN
Freq: Once | INTRAVENOUS | Status: AC
  Filled 2020-06-06: qty 250

## 2020-06-06 MED ORDER — SODIUM CHLORIDE 0.9 % IV SOLN
150.0000 mg | Freq: Once | INTRAVENOUS | Status: AC
  Administered 2020-06-06: 150 mg via INTRAVENOUS
  Filled 2020-06-06: qty 150

## 2020-06-06 MED ORDER — SODIUM CHLORIDE 0.9 % IV SOLN
2000.0000 mg/m2 | INTRAVENOUS | Status: DC
  Administered 2020-06-06: 3850 mg via INTRAVENOUS
  Filled 2020-06-06: qty 77

## 2020-06-06 MED ORDER — HEPARIN SOD (PORK) LOCK FLUSH 100 UNIT/ML IV SOLN
500.0000 [IU] | Freq: Once | INTRAVENOUS | Status: DC | PRN
  Filled 2020-06-06: qty 5

## 2020-06-06 MED ORDER — SODIUM CHLORIDE 0.9 % IV SOLN
150.0000 mg/m2 | Freq: Once | INTRAVENOUS | Status: AC
  Administered 2020-06-06: 280 mg via INTRAVENOUS
  Filled 2020-06-06: qty 14

## 2020-06-06 MED ORDER — PALONOSETRON HCL INJECTION 0.25 MG/5ML
0.2500 mg | Freq: Once | INTRAVENOUS | Status: AC
Start: 2020-06-06 — End: 2020-06-06
  Administered 2020-06-06: 0.25 mg via INTRAVENOUS
  Filled 2020-06-06: qty 5

## 2020-06-06 MED ORDER — SODIUM CHLORIDE 0.9 % IV SOLN
10.0000 mg | Freq: Once | INTRAVENOUS | Status: AC
  Administered 2020-06-06: 10 mg via INTRAVENOUS
  Filled 2020-06-06: qty 10

## 2020-06-06 MED ORDER — OXALIPLATIN CHEMO INJECTION 100 MG/20ML
65.0000 mg/m2 | Freq: Once | INTRAVENOUS | Status: AC
  Administered 2020-06-06: 125 mg via INTRAVENOUS
  Filled 2020-06-06: qty 20

## 2020-06-06 NOTE — Progress Notes (Signed)
  [facility] OFFICE PROGRESS NOTE   Diagnosis: Pancreas cancer  INTERVAL HISTORY:   Mr Poblete completed cycle 2 FOLFIRINOX on 05/23/2020.  He reports cold sensitivity for several days following chemotherapy.  No nausea/vomiting or diarrhea.  He feels well.  He played golf earlier this week.  Objective:  Vital signs in last 24 hours:  Blood pressure 115/73, pulse 76, temperature 97.8 F (36.6 C), temperature source Oral, resp. rate 18, height 5\' 8"  (1.727 m), weight 169 lb 9.6 oz (76.9 kg), SpO2 99 %.    HEENT: No thrush or ulcers Resp: Lungs clear bilaterally Cardio: Regular rate and rhythm GI: Nontender, no mass, no hepatosplenomegaly Vascular: No leg edema  Skin: Palms without erythema  Portacath/PICC-without erythema  Lab Results:  Lab Results  Component Value Date   WBC 5.7 06/06/2020   HGB 13.1 06/06/2020   HCT 39.0 06/06/2020   MCV 91.5 06/06/2020   PLT 86 (L) 06/06/2020   NEUTROABS 2.1 06/06/2020    CMP  Lab Results  Component Value Date   NA 137 05/23/2020   K 4.2 05/23/2020   CL 103 05/23/2020   CO2 27 05/23/2020   GLUCOSE 148 (H) 05/23/2020   BUN 15 05/23/2020   CREATININE 0.63 05/23/2020   CALCIUM 8.9 05/23/2020   PROT 6.1 (L) 05/23/2020   ALBUMIN 4.1 05/23/2020   AST 18 05/23/2020   ALT 19 05/23/2020   ALKPHOS 112 05/23/2020   BILITOT 1.1 05/23/2020   GFRNONAA >60 05/23/2020   GFRAA 99 12/21/2019     Medications: I have reviewed the patient's current medications.   Assessment/Plan: 1. 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 MRI/MRCP 04/05/2020-2.6 x 2.2 cm complex cystic/partially solid lesion in the pancreas head with common bile duct and pancreatic duct obstruction, no evidence for vascular involvement/invasion, no evidence of abdominal metastatic disease ? EUS 04/11/2020-pancreas head mass, 41 x 26 mm with a 13 x 21 mm cystic component, abutment of the portal vein, fine-needle biopsy-adenocarcinoma ? CTs  04/19/2020-heterogenous pancreas head mass measuring 3.3 x 2.9 cm, mass contiguous with the anterior wall the portal splenic confluence with no encasement of the portal vein or superior mesenteric vein.  Fat planes around the celiac axis preserved.  Less than 90 degree contact of the lateral wall of the SMA, no evidence of metastatic disease, 3 mm left lower lobe pulmonary nodule ? Cycle 1 FOLFOX 05/09/2020 (plan to add irinotecan with cycle 2 pending tolerance) ? Cycle 2 FOLFIRINOX 05/23/2020 ? Cycle 3 FOLFIRINOX 06/06/2020, oxaliplatin dose reduced secondary to thrombocytopenia 2. Obstructive jaundice secondary #1  ERCP 04/11/2020-prominent and floppy major papilla, dilated bile duct secondary to a distal stricture, placement of an uncovered metal stent, bile duct brushings-reactive glandular cells 3.   Diabetes  4.   Hyperlipidemia  5.   Glaucoma  6.   Port-A-Cath placement 05/04/2020 Interventional Radiology  7.   Mild thrombocytopenia (121,000) 05/09/2020     Disposition: Mr Antonelli has completed 2 cycles of FOLFIRINOX.  He has tolerated the chemotherapy well.  He will complete cycle 3 today.  We will dose reduce the oxaliplatin secondary to mild thrombocytopenia.  He will return for an office visit and chemotherapy in 2 weeks.  We will check the CA 19-9 when he returns in 2 weeks.  [name], MD  06/06/2020  8:37 AM

## 2020-06-06 NOTE — Patient Instructions (Signed)

## 2020-06-06 NOTE — Patient Instructions (Signed)
[facility]    Discharge Instructions:  Thank you for choosing [facility] to provide your oncology and hematology care.   If you have a lab appointment with the [facility], please go directly to the [facility] and check in at the registration area.   Wear comfortable clothing and clothing appropriate for easy access to any Portacath or PICC line.   We strive to give you quality time with your provider. You may need to reschedule your appointment if you arrive late (15 or more minutes).  Arriving late affects you and other patients whose appointments are after yours.  Also, if you miss three or more appointments without notifying the office, you may be dismissed from the clinic at the provider's discretion.      For prescription refill requests, have your pharmacy contact our office and allow 72 hours for refills to be completed.    Today you received the following chemotherapy and/or immunotherapy agents oxaliplatin, leucovorin, irinotecan, fluorouracil    To help prevent nausea and vomiting after your treatment, we encourage you to take your nausea medication as directed.  BELOW ARE SYMPTOMS THAT SHOULD BE REPORTED IMMEDIATELY: . *FEVER GREATER THAN 100.4 F (38 C) OR HIGHER . *CHILLS OR SWEATING . *NAUSEA AND VOMITING THAT IS NOT CONTROLLED WITH YOUR NAUSEA MEDICATION . *UNUSUAL SHORTNESS OF BREATH . *UNUSUAL BRUISING OR BLEEDING . *URINARY PROBLEMS (pain or burning when urinating, or frequent urination) . *BOWEL PROBLEMS (unusual diarrhea, constipation, pain near the anus) . TENDERNESS IN MOUTH AND THROAT WITH OR WITHOUT PRESENCE OF ULCERS (sore throat, sores in mouth, or a toothache) . UNUSUAL RASH, SWELLING OR PAIN  . UNUSUAL VAGINAL DISCHARGE OR ITCHING   Items with * indicate a potential emergency and should be followed up as soon as possible or go to the Emergency Department if any problems should occur.  Please show the  CHEMOTHERAPY ALERT CARD or IMMUNOTHERAPY ALERT CARD at check-in to the Emergency Department and triage nurse.  Should you have questions after your visit or need to cancel or reschedule your appointment, please contact [facility]  Dept: [phone]  and follow the prompts.  Office hours are 8:00 a.m. to 4:30 p.m. Monday - Friday. Please note that voicemails left after 4:00 p.m. may not be returned until the following business day.  We are closed weekends and major holidays. You have access to a nurse at all times for urgent questions. Please call the main number to the clinic Dept: [phone] and follow the prompts.   For any non-urgent questions, you may also contact your provider using MyChart. We now offer e-Visits for anyone 37 and older to request care online for non-urgent symptoms. For details visit [email or web address].   Also download the MyChart app! Go to the app store, search "MyChart", open the app, select [facility], and log in with your MyChart username and password.  Due to Covid, a mask is required upon entering the hospital/clinic. If you do not have a mask, one will be given to you upon arrival. For doctor visits, patients may have 1 support person aged 50 or older with them. For treatment visits, patients cannot have anyone with them due to current Covid guidelines and our immunocompromised population.  Oxaliplatin Injection What is this medicine? OXALIPLATIN (ox AL i PLA tin) is a chemotherapy drug. It targets fast dividing cells, like cancer cells, and causes these cells to die. This medicine is used to treat  cancers of the colon and rectum, and many other cancers. This medicine may be used for other purposes; ask your health care provider or pharmacist if you have questions. COMMON BRAND NAME(S): Eloxatin What should I tell my health care provider before I take this medicine? They need to know if you have any of these conditions:  heart  disease  history of irregular heartbeat  liver disease  low blood counts, like white cells, platelets, or red blood cells  lung or breathing disease, like asthma  take medicines that treat or prevent blood clots  tingling of the fingers or toes, or other nerve disorder  an unusual or allergic reaction to oxaliplatin, other chemotherapy, other medicines, foods, dyes, or preservatives  pregnant or trying to get pregnant  breast-feeding How should I use this medicine? This drug is given as an infusion into a vein. It is administered in a hospital or clinic by a specially trained health care professional. Talk to your pediatrician regarding the use of this medicine in children. Special care may be needed. Overdosage: If you think you have taken too much of this medicine contact a poison control center or emergency room at once. NOTE: This medicine is only for you. Do not share this medicine with others. What if I miss a dose? It is important not to miss a dose. Call your doctor or health care professional if you are unable to keep an appointment. What may interact with this medicine? Do not take this medicine with any of the following medications:  cisapride  dronedarone  pimozide  thioridazine This medicine may also interact with the following medications:  aspirin and aspirin-like medicines  certain medicines that treat or prevent blood clots like warfarin, apixaban, dabigatran, and rivaroxaban  cisplatin  cyclosporine  diuretics  medicines for infection like acyclovir, adefovir, amphotericin B, bacitracin, cidofovir, foscarnet, ganciclovir, gentamicin, pentamidine, vancomycin  NSAIDs, medicines for pain and inflammation, like ibuprofen or naproxen  other medicines that prolong the QT interval (an abnormal heart rhythm)  pamidronate  zoledronic acid This list may not describe all possible interactions. Give your health care provider a list of all the medicines,  herbs, non-prescription drugs, or dietary supplements you use. Also tell them if you smoke, drink alcohol, or use illegal drugs. Some items may interact with your medicine. What should I watch for while using this medicine? Your condition will be monitored carefully while you are receiving this medicine. You may need blood work done while you are taking this medicine. This medicine may make you feel generally unwell. This is not uncommon as chemotherapy can affect healthy cells as well as cancer cells. Report any side effects. Continue your course of treatment even though you feel ill unless your healthcare professional tells you to stop. This medicine can make you more sensitive to cold. Do not drink cold drinks or use ice. Cover exposed skin before coming in contact with cold temperatures or cold objects. When out in cold weather wear warm clothing and cover your mouth and nose to warm the air that goes into your lungs. Tell your doctor if you get sensitive to the cold. Do not become pregnant while taking this medicine or for 9 months after stopping it. Women should inform their health care professional if they wish to become pregnant or think they might be pregnant. Men should not father a child while taking this medicine and for 6 months after stopping it. There is potential for serious side effects to an unborn child.  Talk to your health care professional for more information. Do not breast-feed a child while taking this medicine or for 3 months after stopping it. This medicine has caused ovarian failure in some women. This medicine may make it more difficult to get pregnant. Talk to your health care professional if you are concerned about your fertility. This medicine has caused decreased sperm counts in some men. This may make it more difficult to father a child. Talk to your health care professional if you are concerned about your fertility. This medicine may increase your risk of getting an  infection. Call your health care professional for advice if you get a fever, chills, or sore throat, or other symptoms of a cold or flu. Do not treat yourself. Try to avoid being around people who are sick. Avoid taking medicines that contain aspirin, acetaminophen, ibuprofen, naproxen, or ketoprofen unless instructed by your health care professional. These medicines may hide a fever. Be careful brushing or flossing your teeth or using a toothpick because you may get an infection or bleed more easily. If you have any dental work done, tell your dentist you are receiving this medicine. What side effects may I notice from receiving this medicine? Side effects that you should report to your doctor or health care professional as soon as possible:  allergic reactions like skin rash, itching or hives, swelling of the face, lips, or tongue  breathing problems  cough  low blood counts - this medicine may decrease the number of white blood cells, red blood cells, and platelets. You may be at increased risk for infections and bleeding  nausea, vomiting  pain, redness, or irritation at site where injected  pain, tingling, numbness in the hands or feet  signs and symptoms of bleeding such as bloody or black, tarry stools; red or dark brown urine; spitting up blood or brown material that looks like coffee grounds; red spots on the skin; unusual bruising or bleeding from the eyes, gums, or nose  signs and symptoms of a dangerous change in heartbeat or heart rhythm like chest pain; dizziness; fast, irregular heartbeat; palpitations; feeling faint or lightheaded; falls  signs and symptoms of infection like fever; chills; cough; sore throat; pain or trouble passing urine  signs and symptoms of liver injury like dark yellow or brown urine; general ill feeling or flu-like symptoms; light-colored stools; loss of appetite; nausea; right upper belly pain; unusually weak or tired; yellowing of the eyes or  skin  signs and symptoms of low red blood cells or anemia such as unusually weak or tired; feeling faint or lightheaded; falls  signs and symptoms of muscle injury like dark urine; trouble passing urine or change in the amount of urine; unusually weak or tired; muscle pain; back pain Side effects that usually do not require medical attention (report to your doctor or health care professional if they continue or are bothersome):  changes in taste  diarrhea  gas  hair loss  loss of appetite  mouth sores This list may not describe all possible side effects. Call your doctor for medical advice about side effects. You may report side effects to FDA at 1-800-FDA-1088. Where should I keep my medicine? This drug is given in a hospital or clinic and will not be stored at home. NOTE: This sheet is a summary. It may not cover all possible information. If you have questions about this medicine, talk to your doctor, pharmacist, or health care provider.  2021 Elsevier/Gold Standard (2018-06-02 12:20:35)  Leucovorin injection What is this medicine? LEUCOVORIN (loo koe VOR in) is used to prevent or treat the harmful effects of some medicines. This medicine is used to treat anemia caused by a low amount of folic acid in the body. It is also used with 5-fluorouracil (5-FU) to treat colon cancer. This medicine may be used for other purposes; ask your health care provider or pharmacist if you have questions. What should I tell my health care provider before I take this medicine? They need to know if you have any of these conditions:  anemia from low levels of vitamin B-12 in the blood  an unusual or allergic reaction to leucovorin, folic acid, other medicines, foods, dyes, or preservatives  pregnant or trying to get pregnant  breast-feeding How should I use this medicine? This medicine is for injection into a muscle or into a vein. It is given by a health care professional in a hospital or clinic  setting. Talk to your pediatrician regarding the use of this medicine in children. Special care may be needed. Overdosage: If you think you have taken too much of this medicine contact a poison control center or emergency room at once. NOTE: This medicine is only for you. Do not share this medicine with others. What if I miss a dose? This does not apply. What may interact with this medicine?  capecitabine  fluorouracil  phenobarbital  phenytoin  primidone  trimethoprim-sulfamethoxazole This list may not describe all possible interactions. Give your health care provider a list of all the medicines, herbs, non-prescription drugs, or dietary supplements you use. Also tell them if you smoke, drink alcohol, or use illegal drugs. Some items may interact with your medicine. What should I watch for while using this medicine? Your condition will be monitored carefully while you are receiving this medicine. This medicine may increase the side effects of 5-fluorouracil, 5-FU. Tell your doctor or health care professional if you have diarrhea or mouth sores that do not get better or that get worse. What side effects may I notice from receiving this medicine? Side effects that you should report to your doctor or health care professional as soon as possible:  allergic reactions like skin rash, itching or hives, swelling of the face, lips, or tongue  breathing problems  fever, infection  mouth sores  unusual bleeding or bruising  unusually weak or tired Side effects that usually do not require medical attention (report to your doctor or health care professional if they continue or are bothersome):  constipation or diarrhea  loss of appetite  nausea, vomiting This list may not describe all possible side effects. Call your doctor for medical advice about side effects. You may report side effects to FDA at 1-800-FDA-1088. Where should I keep my medicine? This drug is given in a hospital or  clinic and will not be stored at home. NOTE: This sheet is a summary. It may not cover all possible information. If you have questions about this medicine, talk to your doctor, pharmacist, or health care provider.  2021 Elsevier/Gold Standard (2007-07-20 16:50:29)  Irinotecan injection What is this medicine? IRINOTECAN (ir in oh TEE kan ) is a chemotherapy drug. It is used to treat colon and rectal cancer. This medicine may be used for other purposes; ask your health care provider or pharmacist if you have questions. COMMON BRAND NAME(S): Camptosar What should I tell my health care provider before I take this medicine? They need to know if you have any of these conditions:  dehydration  diarrhea  infection (especially a virus infection such as chickenpox, cold sores, or herpes)  liver disease  low blood counts, like low white cell, platelet, or red cell counts  low levels of calcium, magnesium, or potassium in the blood  recent or ongoing radiation therapy  an unusual or allergic reaction to irinotecan, other medicines, foods, dyes, or preservatives  pregnant or trying to get pregnant  breast-feeding How should I use this medicine? This drug is given as an infusion into a vein. It is administered in a hospital or clinic by a specially trained health care professional. Talk to your pediatrician regarding the use of this medicine in children. Special care may be needed. Overdosage: If you think you have taken too much of this medicine contact a poison control center or emergency room at once. NOTE: This medicine is only for you. Do not share this medicine with others. What if I miss a dose? It is important not to miss your dose. Call your doctor or health care professional if you are unable to keep an appointment. What may interact with this medicine? Do not take this medicine with any of the following medications:  cobicistat  itraconazole This medicine may interact with  the following medications:  antiviral medicines for HIV or AIDS  certain antibiotics like rifampin or rifabutin  certain medicines for fungal infections like ketoconazole, posaconazole, and voriconazole  certain medicines for seizures like carbamazepine, phenobarbital, phenotoin  clarithromycin  gemfibrozil  nefazodone  St. John's Wort This list may not describe all possible interactions. Give your health care provider a list of all the medicines, herbs, non-prescription drugs, or dietary supplements you use. Also tell them if you smoke, drink alcohol, or use illegal drugs. Some items may interact with your medicine. What should I watch for while using this medicine? Your condition will be monitored carefully while you are receiving this medicine. You will need important blood work done while you are taking this medicine. This drug may make you feel generally unwell. This is not uncommon, as chemotherapy can affect healthy cells as well as cancer cells. Report any side effects. Continue your course of treatment even though you feel ill unless your doctor tells you to stop. In some cases, you may be given additional medicines to help with side effects. Follow all directions for their use. You may get drowsy or dizzy. Do not drive, use machinery, or do anything that needs mental alertness until you know how this medicine affects you. Do not stand or sit up quickly, especially if you are an older patient. This reduces the risk of dizzy or fainting spells. Call your health care professional for advice if you get a fever, chills, or sore throat, or other symptoms of a cold or flu. Do not treat yourself. This medicine decreases your body's ability to fight infections. Try to avoid being around people who are sick. Avoid taking products that contain aspirin, acetaminophen, ibuprofen, naproxen, or ketoprofen unless instructed by your doctor. These medicines may hide a fever. This medicine may  increase your risk to bruise or bleed. Call your doctor or health care professional if you notice any unusual bleeding. Be careful brushing and flossing your teeth or using a toothpick because you may get an infection or bleed more easily. If you have any dental work done, tell your dentist you are receiving this medicine. Do not become pregnant while taking this medicine or for 6 months after stopping it. Women should inform their health  care professional if they wish to become pregnant or think they might be pregnant. Men should not father a child while taking this medicine and for 3 months after stopping it. There is potential for serious side effects to an unborn child. Talk to your health care professional for more information. Do not breast-feed an infant while taking this medicine or for 7 days after stopping it. This medicine has caused ovarian failure in some women. This medicine may make it more difficult to get pregnant. Talk to your health care professional if you are concerned about your fertility. This medicine has caused decreased sperm counts in some men. This may make it more difficult to father a child. Talk to your health care professional if you are concerned about your fertility. What side effects may I notice from receiving this medicine? Side effects that you should report to your doctor or health care professional as soon as possible:  allergic reactions like skin rash, itching or hives, swelling of the face, lips, or tongue  chest pain  diarrhea  flushing, runny nose, sweating during infusion  low blood counts - this medicine may decrease the number of white blood cells, red blood cells and platelets. You may be at increased risk for infections and bleeding.  nausea, vomiting  pain, swelling, warmth in the leg  signs of decreased platelets or bleeding - bruising, pinpoint red spots on the skin, black, tarry stools, blood in the urine  signs of infection - fever or  chills, cough, sore throat, pain or difficulty passing urine  signs of decreased red blood cells - unusually weak or tired, fainting spells, lightheadedness Side effects that usually do not require medical attention (report to your doctor or health care professional if they continue or are bothersome):  constipation  hair loss  headache  loss of appetite  mouth sores  stomach pain This list may not describe all possible side effects. Call your doctor for medical advice about side effects. You may report side effects to FDA at 1-800-FDA-1088. Where should I keep my medicine? This drug is given in a hospital or clinic and will not be stored at home. NOTE: This sheet is a summary. It may not cover all possible information. If you have questions about this medicine, talk to your doctor, pharmacist, or health care provider.  2021 Elsevier/Gold Standard (2018-12-14 17:46:13)

## 2020-06-07 ENCOUNTER — Encounter: Payer: Self-pay | Admitting: Oncology

## 2020-06-07 ENCOUNTER — Telehealth: Payer: Self-pay | Admitting: *Deleted

## 2020-06-07 LAB — CANCER ANTIGEN 19-9: CA 19-9: 1508 U/mL — ABNORMAL HIGH (ref 0–35)

## 2020-06-07 NOTE — Telephone Encounter (Signed)
Reports wife has chronic respiratory issues and over last couple nights had more cough and shortness of breath. COVID tested today at home and was positive and tested + at [facility] office today as well. He is not symptomatic at this time. They have isolated themselves--separate floors.  Per Dr. Benay Spice: Call if symptoms develop and continue isolation.

## 2020-06-08 ENCOUNTER — Inpatient Hospital Stay: Payer: Medicare HMO

## 2020-06-08 ENCOUNTER — Telehealth: Payer: Self-pay | Admitting: *Deleted

## 2020-06-08 DIAGNOSIS — C250 Malignant neoplasm of head of pancreas: Secondary | ICD-10-CM | POA: Diagnosis not present

## 2020-06-08 NOTE — Telephone Encounter (Signed)
Patient is asymptomatic of Covid (no fever, chills, muscle, aches, nausea, vomiting, diarrhea, cough, shortness of breath etc). Patient's wife instructed that he needs to go to Urgent Care over the weekend if he develops symptoms so that the appropriate protocol can be started on patient. Wife verbalized understanding and was in agreement of plan.

## 2020-06-17 ENCOUNTER — Other Ambulatory Visit: Payer: Self-pay | Admitting: Oncology

## 2020-06-20 ENCOUNTER — Telehealth: Payer: Self-pay | Admitting: Nurse Practitioner

## 2020-06-20 ENCOUNTER — Inpatient Hospital Stay: Payer: Medicare HMO

## 2020-06-20 ENCOUNTER — Encounter: Payer: Self-pay | Admitting: Nurse Practitioner

## 2020-06-20 ENCOUNTER — Other Ambulatory Visit: Payer: Self-pay

## 2020-06-20 ENCOUNTER — Inpatient Hospital Stay (HOSPITAL_BASED_OUTPATIENT_CLINIC_OR_DEPARTMENT_OTHER): Payer: Medicare HMO | Admitting: Nurse Practitioner

## 2020-06-20 VITALS — BP 129/83 | HR 80 | Temp 98.1°F | Resp 20 | Ht 68.0 in | Wt 168.2 lb

## 2020-06-20 DIAGNOSIS — C250 Malignant neoplasm of head of pancreas: Secondary | ICD-10-CM

## 2020-06-20 DIAGNOSIS — E785 Hyperlipidemia, unspecified: Secondary | ICD-10-CM

## 2020-06-20 DIAGNOSIS — Z5111 Encounter for antineoplastic chemotherapy: Secondary | ICD-10-CM | POA: Diagnosis not present

## 2020-06-20 DIAGNOSIS — Z794 Long term (current) use of insulin: Secondary | ICD-10-CM | POA: Diagnosis not present

## 2020-06-20 DIAGNOSIS — C259 Malignant neoplasm of pancreas, unspecified: Secondary | ICD-10-CM

## 2020-06-20 DIAGNOSIS — E119 Type 2 diabetes mellitus without complications: Secondary | ICD-10-CM

## 2020-06-20 DIAGNOSIS — K831 Obstruction of bile duct: Secondary | ICD-10-CM

## 2020-06-20 DIAGNOSIS — D696 Thrombocytopenia, unspecified: Secondary | ICD-10-CM | POA: Diagnosis not present

## 2020-06-20 DIAGNOSIS — H409 Unspecified glaucoma: Secondary | ICD-10-CM

## 2020-06-20 LAB — CMP (CANCER CENTER ONLY)
ALT: 22 U/L (ref 0–44)
AST: 18 U/L (ref 15–41)
Albumin: 3.8 g/dL (ref 3.5–5.0)
Alkaline Phosphatase: 80 U/L (ref 38–126)
Anion gap: 8 (ref 5–15)
BUN: 12 mg/dL (ref 8–23)
CO2: 28 mmol/L (ref 22–32)
Calcium: 8.5 mg/dL — ABNORMAL LOW (ref 8.9–10.3)
Chloride: 101 mmol/L (ref 98–111)
Creatinine: 0.66 mg/dL (ref 0.61–1.24)
GFR, Estimated: >60 mL/min (ref >60)
Glucose, Bld: 205 mg/dL — ABNORMAL HIGH (ref 70–99)
Potassium: 4.1 mmol/L (ref 3.5–5.1)
Sodium: 137 mmol/L (ref 135–145)
Total Bilirubin: 0.8 mg/dL (ref 0.3–1.2)
Total Protein: 6.2 g/dL — ABNORMAL LOW (ref 6.5–8.1)

## 2020-06-20 LAB — CBC WITH DIFFERENTIAL (CANCER CENTER ONLY)
Abs Immature Granulocytes: 0.03 10*3/uL (ref 0.00–0.07)
Basophils Absolute: 0 10*3/uL (ref 0.0–0.1)
Basophils Relative: 0 %
Eosinophils Absolute: 0 10*3/uL (ref 0.0–0.5)
Eosinophils Relative: 1 %
HCT: 39.6 % (ref 39.0–52.0)
Hemoglobin: 13.6 g/dL (ref 13.0–17.0)
Immature Granulocytes: 1 %
Lymphocytes Relative: 36 %
Lymphs Abs: 2.1 10*3/uL (ref 0.7–4.0)
MCH: 31 pg (ref 26.0–34.0)
MCHC: 34.3 g/dL (ref 30.0–36.0)
MCV: 90.2 fL (ref 80.0–100.0)
Monocytes Absolute: 1.4 10*3/uL — ABNORMAL HIGH (ref 0.1–1.0)
Monocytes Relative: 24 %
Neutro Abs: 2.3 10*3/uL (ref 1.7–7.7)
Neutrophils Relative %: 38 %
Platelet Count: 40 10*3/uL — ABNORMAL LOW (ref 150–400)
RBC: 4.39 MIL/uL (ref 4.22–5.81)
RDW: 13.8 % (ref 11.5–15.5)
WBC Count: 5.9 10*3/uL (ref 4.0–10.5)
nRBC: 0 % (ref 0.0–0.2)

## 2020-06-20 MED ORDER — LORAZEPAM 0.5 MG PO TABS: 0.5000 mg | ORAL_TABLET | Freq: Three times a day (TID) | ORAL | 0 refills | Status: DC | PRN

## 2020-06-20 NOTE — Patient Instructions (Signed)

## 2020-06-20 NOTE — Progress Notes (Signed)
  [facility] OFFICE PROGRESS NOTE   Diagnosis: Pancreas cancer  INTERVAL HISTORY:   Mr. Lagrand completed cycle 3 FOLFIRINOX 06/06/2020.  He denies nausea/vomiting.  No mouth sores.  No diarrhea.  He has mild persistent cold sensitivity.  No numbness or tingling in the absence of cold exposure.  He contacted the office on 06/07/2020 to report his wife had tested positive for COVID.  He tested 5 or 6 days later and reports this was negative.  He has no respiratory symptoms.   Objective:  Vital signs in last 24 hours:  Blood pressure 129/83, pulse 80, temperature 98.1 F (36.7 C), temperature source Oral, resp. rate 20, height 5\' 8"  (1.727 m), weight 168 lb 3.2 oz (76.3 kg), SpO2 99 %.    HEENT: No thrush or ulcers. Resp: Lungs clear bilaterally. Cardio: Regular rate and rhythm. GI: Abdomen soft and nontender.  No hepatomegaly. Vascular: No leg edema. Neuro: Vibratory sense intact over the fingertips per tuning fork exam. Skin: Palms without erythema. Port-A-Cath without erythema.   Lab Results:  Lab Results  Component Value Date   WBC 5.9 06/20/2020   HGB 13.6 06/20/2020   HCT 39.6 06/20/2020   MCV 90.2 06/20/2020   PLT 40 (L) 06/20/2020   NEUTROABS 2.3 06/20/2020    Imaging:  No results found.  Medications: I have reviewed the patient's current medications.  Assessment/Plan: 1. 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 MRI/MRCP 04/05/2020-2.6 x 2.2 cm complex cystic/partially solid lesion in the pancreas head with common bile duct and pancreatic duct obstruction, no evidence for vascular involvement/invasion, no evidence of abdominal metastatic disease ? EUS 04/11/2020-pancreas head mass, 41 x 26 mm with a 13 x 21 mm cystic component, abutment of the portal vein, fine-needle biopsy-adenocarcinoma ? CTs 04/19/2020-heterogenous pancreas head mass measuring 3.3 x 2.9 cm, mass contiguous with the anterior wall the portal splenic confluence with no encasement of  the portal vein or superior mesenteric vein. Fat planes around the celiac axis preserved. Less than 90 degree contact of the lateral wall of the SMA, no evidence of metastatic disease, 3 mm left lower lobe pulmonary nodule ? Cycle 1 FOLFOX 05/09/2020 (plan to add irinotecan with cycle 2 pending tolerance) ? Cycle 2 FOLFIRINOX 05/23/2020 ? Cycle 3 FOLFIRINOX 06/06/2020, oxaliplatin dose reduced secondary to thrombocytopenia ? 06/20/2020 chemotherapy held due to thrombocytopenia, rescheduled for 1 week 2. Obstructive jaundice secondary #1  ERCP 04/11/2020-prominent and floppy major papilla, dilated bile duct secondary to a distal stricture, placement of anuncovered metal stent, bile duct brushings-reactive glandular cells 3.Diabetes  4.Hyperlipidemia  5.Glaucoma  6.   Port-A-Cath placement 05/04/2020 Interventional Radiology  7.   Mild thrombocytopenia (121,000) 05/09/2020  Disposition: Mr. Netzel appears stable.  He has completed 3 cycles of FOLFIRINOX.  He is tolerating chemotherapy well.  The platelet count returned today at 40,000.  We are holding today's treatment and rescheduling to next week.  He understands to contact the office with any bleeding.  He will return for lab, follow-up, FOLFIRINOX in 1 week.  He will contact the office in the interim as outlined above or with any other problems.    Daanish Copes ANP/GNP-BC   06/20/2020  8:53 AM

## 2020-06-20 NOTE — Progress Notes (Signed)
Patient returned his infusystem portable pump from last treatment.

## 2020-06-20 NOTE — Telephone Encounter (Signed)
Returned call to Nathan Mora regarding her question about Nathan Mora receiving a platelet transfusion to increase the platelet count so he could receive chemotherapy this week.  I explained our rationale for holding treatment today and allowing the bone marrow to recover.  We discussed bleeding precautions.  She understands to contact the office with bleeding.  He is scheduled to return next week for labs, visit and chemotherapy.

## 2020-06-21 ENCOUNTER — Encounter: Payer: Self-pay | Admitting: Nurse Practitioner

## 2020-06-21 ENCOUNTER — Ambulatory Visit: Payer: Self-pay | Admitting: Family Medicine

## 2020-06-21 LAB — CANCER ANTIGEN 19-9: CA 19-9: 398 U/mL — ABNORMAL HIGH (ref 0–35)

## 2020-06-26 ENCOUNTER — Inpatient Hospital Stay: Payer: Medicare HMO

## 2020-06-26 ENCOUNTER — Other Ambulatory Visit: Payer: Self-pay

## 2020-06-26 ENCOUNTER — Inpatient Hospital Stay (HOSPITAL_BASED_OUTPATIENT_CLINIC_OR_DEPARTMENT_OTHER): Payer: Medicare HMO | Admitting: Oncology

## 2020-06-26 VITALS — BP 113/73 | HR 81 | Temp 98.2°F | Resp 18 | Ht 68.0 in | Wt 171.0 lb

## 2020-06-26 DIAGNOSIS — C259 Malignant neoplasm of pancreas, unspecified: Secondary | ICD-10-CM | POA: Diagnosis not present

## 2020-06-26 DIAGNOSIS — E119 Type 2 diabetes mellitus without complications: Secondary | ICD-10-CM | POA: Diagnosis not present

## 2020-06-26 DIAGNOSIS — C250 Malignant neoplasm of head of pancreas: Secondary | ICD-10-CM | POA: Diagnosis not present

## 2020-06-26 DIAGNOSIS — Z5111 Encounter for antineoplastic chemotherapy: Secondary | ICD-10-CM | POA: Diagnosis not present

## 2020-06-26 DIAGNOSIS — K831 Obstruction of bile duct: Secondary | ICD-10-CM | POA: Diagnosis not present

## 2020-06-26 DIAGNOSIS — E785 Hyperlipidemia, unspecified: Secondary | ICD-10-CM | POA: Diagnosis not present

## 2020-06-26 DIAGNOSIS — H409 Unspecified glaucoma: Secondary | ICD-10-CM | POA: Diagnosis not present

## 2020-06-26 DIAGNOSIS — D696 Thrombocytopenia, unspecified: Secondary | ICD-10-CM | POA: Diagnosis not present

## 2020-06-26 DIAGNOSIS — Z95828 Presence of other vascular implants and grafts: Secondary | ICD-10-CM

## 2020-06-26 DIAGNOSIS — Z794 Long term (current) use of insulin: Secondary | ICD-10-CM | POA: Diagnosis not present

## 2020-06-26 LAB — CBC WITH DIFFERENTIAL (CANCER CENTER ONLY)
Abs Immature Granulocytes: 0.01 10*3/uL (ref 0.00–0.07)
Basophils Absolute: 0 10*3/uL (ref 0.0–0.1)
Basophils Relative: 0 %
Eosinophils Absolute: 0 10*3/uL (ref 0.0–0.5)
Eosinophils Relative: 0 %
HCT: 42.3 % (ref 39.0–52.0)
Hemoglobin: 14.1 g/dL (ref 13.0–17.0)
Immature Granulocytes: 0 %
Lymphocytes Relative: 58 %
Lymphs Abs: 3.1 10*3/uL (ref 0.7–4.0)
MCH: 30.3 pg (ref 26.0–34.0)
MCHC: 33.3 g/dL (ref 30.0–36.0)
MCV: 91 fL (ref 80.0–100.0)
Monocytes Absolute: 0.7 10*3/uL (ref 0.1–1.0)
Monocytes Relative: 13 %
Neutro Abs: 1.6 10*3/uL — ABNORMAL LOW (ref 1.7–7.7)
Neutrophils Relative %: 29 %
Platelet Count: 89 10*3/uL — ABNORMAL LOW (ref 150–400)
RBC: 4.65 MIL/uL (ref 4.22–5.81)
RDW: 13.7 % (ref 11.5–15.5)
WBC Count: 5.5 10*3/uL (ref 4.0–10.5)
nRBC: 0 % (ref 0.0–0.2)

## 2020-06-26 LAB — CMP (CANCER CENTER ONLY)
ALT: 24 U/L (ref 0–44)
AST: 19 U/L (ref 15–41)
Albumin: 3.9 g/dL (ref 3.5–5.0)
Alkaline Phosphatase: 83 U/L (ref 38–126)
Anion gap: 7 (ref 5–15)
BUN: 15 mg/dL (ref 8–23)
CO2: 27 mmol/L (ref 22–32)
Calcium: 8.5 mg/dL — ABNORMAL LOW (ref 8.9–10.3)
Chloride: 104 mmol/L (ref 98–111)
Creatinine: 0.68 mg/dL (ref 0.61–1.24)
GFR, Estimated: >60 mL/min (ref >60)
Glucose, Bld: 157 mg/dL — ABNORMAL HIGH (ref 70–99)
Potassium: 4.3 mmol/L (ref 3.5–5.1)
Sodium: 138 mmol/L (ref 135–145)
Total Bilirubin: 0.4 mg/dL (ref 0.3–1.2)
Total Protein: 5.8 g/dL — ABNORMAL LOW (ref 6.5–8.1)

## 2020-06-26 MED ORDER — PANCRELIPASE (LIP-PROT-AMYL) 36000-114000 UNITS PO CPEP: ORAL_CAPSULE | ORAL | 0 refills | Status: DC

## 2020-06-26 MED ORDER — HEPARIN SOD (PORK) LOCK FLUSH 100 UNIT/ML IV SOLN
500.0000 [IU] | Freq: Once | INTRAVENOUS | Status: AC
  Administered 2020-06-26: 500 [IU] via INTRAVENOUS
  Filled 2020-06-26: qty 5

## 2020-06-26 MED ORDER — SODIUM CHLORIDE 0.9% FLUSH
10.0000 mL | Freq: Once | INTRAVENOUS | Status: AC
  Administered 2020-06-26: 10 mL via INTRAVENOUS
  Filled 2020-06-26: qty 10

## 2020-06-26 NOTE — Progress Notes (Signed)
[facility] OFFICE PROGRESS NOTE   Diagnosis: Pancreas cancer  INTERVAL HISTORY:   Mr Preece returns as scheduled.  He feels well.  No neuropathy symptoms.  No diarrhea.  Chemotherapy was held last week secondary to thrombocytopenia.  He is scheduled for restaging CT[facility] at [facility] after this cycle of chemotherapy.  Objective:  Vital signs in last 24 hours:  Blood pressure 113/73, pulse 81, temperature 98.2 F (36.8 C), temperature source Oral, resp. rate 18, height 5\' 8"  (1.727 m), weight 171 lb (77.6 kg), SpO2 98 %.    HEENT: No thrush or ulcers Resp: Lungs clear bilaterally Cardio: Regular rate and rhythm GI: No mass, nontender, no hepatosplenomegaly Vascular: No leg edema   Portacath/PICC-without erythema  Lab Results:  Lab Results  Component Value Date   WBC 5.5 06/26/2020   HGB 14.1 06/26/2020   HCT 42.3 06/26/2020   MCV 91.0 06/26/2020   PLT 89 (L) 06/26/2020   NEUTROABS 1.6 (L) 06/26/2020    CMP  Lab Results  Component Value Date   NA 138 06/26/2020   K 4.3 06/26/2020   CL 104 06/26/2020   CO2 27 06/26/2020   GLUCOSE 157 (H) 06/26/2020   BUN 15 06/26/2020   CREATININE 0.68 06/26/2020   CALCIUM 8.5 (L) 06/26/2020   PROT 5.8 (L) 06/26/2020   ALBUMIN 3.9 06/26/2020   AST 19 06/26/2020   ALT 24 06/26/2020   ALKPHOS 83 06/26/2020   BILITOT 0.4 06/26/2020   GFRNONAA >60 06/26/2020   GFRAA 99 12/21/2019    Medications: I have reviewed the patient's current medications.   Assessment/Plan: 1. 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 MRI/MRCP 04/05/2020-2.6 x 2.2 cm complex cystic/partially solid lesion in the pancreas head with common bile duct and pancreatic duct obstruction, no evidence for vascular involvement/invasion, no evidence of abdominal metastatic disease ? EUS 04/11/2020-pancreas head mass, 41 x 26 mm with a 13 x 21 mm cystic component, abutment of the portal vein, fine-needle biopsy-adenocarcinoma ? CTs 04/19/2020-heterogenous  pancreas head mass measuring 3.3 x 2.9 cm, mass contiguous with the anterior wall the portal splenic confluence with no encasement of the portal vein or superior mesenteric vein. Fat planes around the celiac axis preserved. Less than 90 degree contact of the lateral wall of the SMA, no evidence of metastatic disease, 3 mm left lower lobe pulmonary nodule ? Cycle 1 FOLFOX 05/09/2020 (plan to add irinotecan with cycle 2 pending tolerance) ? Cycle 2 FOLFIRINOX 05/23/2020 ? Cycle 3 FOLFIRINOX 06/06/2020, oxaliplatin dose reduced secondary to thrombocytopenia ? Cycle 4 FOLFIRINOX 06/27/2020, oxaliplatin further dose reduced secondary to thrombocytopenia 2. Obstructive jaundice secondary #1  ERCP 04/11/2020-prominent and floppy major papilla, dilated bile duct secondary to a distal stricture, placement of anuncovered metal stent, bile duct brushings-reactive glandular cells 3.Diabetes  4.Hyperlipidemia  5.Glaucoma  6.   Port-A-Cath placement 05/04/2020 Interventional Radiology  7.   Mild thrombocytopenia (121,000) 05/09/2020    Disposition: Mr Plain appears stable.  He has completed 3 cycles of FOLFIRINOX.  Chemotherapy was held last week secondary to thrombocytopenia.  The platelet count has partially recovered.  The plan is to proceed with cycle 4 FOLFIRINOX tomorrow.  Oxaliplatin will be further dose reduced.  The CA 19-9 has improved.  He is scheduled undergo restaging CTs and see Dr. Carlis Abbott after this cycle.  Mr Fonseca will return for an office visit and chemotherapy in 2 weeks.  He continues Creon before meals and snacks.  No diarrhea.  [name], MD  06/26/2020  12:25 PM

## 2020-06-26 NOTE — Patient Instructions (Signed)

## 2020-06-27 ENCOUNTER — Other Ambulatory Visit: Payer: Self-pay | Admitting: Oncology

## 2020-06-27 ENCOUNTER — Inpatient Hospital Stay: Payer: Medicare HMO | Attending: Oncology

## 2020-06-27 ENCOUNTER — Telehealth: Payer: Self-pay

## 2020-06-27 VITALS — BP 121/80 | HR 73 | Temp 98.0°F | Resp 18

## 2020-06-27 DIAGNOSIS — K831 Obstruction of bile duct: Secondary | ICD-10-CM | POA: Diagnosis not present

## 2020-06-27 DIAGNOSIS — Z794 Long term (current) use of insulin: Secondary | ICD-10-CM | POA: Insufficient documentation

## 2020-06-27 DIAGNOSIS — E785 Hyperlipidemia, unspecified: Secondary | ICD-10-CM | POA: Insufficient documentation

## 2020-06-27 DIAGNOSIS — Z5111 Encounter for antineoplastic chemotherapy: Secondary | ICD-10-CM | POA: Insufficient documentation

## 2020-06-27 DIAGNOSIS — H409 Unspecified glaucoma: Secondary | ICD-10-CM | POA: Diagnosis not present

## 2020-06-27 DIAGNOSIS — C250 Malignant neoplasm of head of pancreas: Secondary | ICD-10-CM | POA: Insufficient documentation

## 2020-06-27 DIAGNOSIS — E119 Type 2 diabetes mellitus without complications: Secondary | ICD-10-CM | POA: Diagnosis not present

## 2020-06-27 DIAGNOSIS — D696 Thrombocytopenia, unspecified: Secondary | ICD-10-CM | POA: Insufficient documentation

## 2020-06-27 MED ORDER — SODIUM CHLORIDE 0.9 % IV SOLN
10.0000 mg | Freq: Once | INTRAVENOUS | Status: AC
  Administered 2020-06-27: 10 mg via INTRAVENOUS
  Filled 2020-06-27: qty 1

## 2020-06-27 MED ORDER — DEXTROSE 5 % IV SOLN
Freq: Once | INTRAVENOUS | Status: AC
  Filled 2020-06-27: qty 250

## 2020-06-27 MED ORDER — LEUCOVORIN CALCIUM INJECTION 350 MG
400.0000 mg/m2 | Freq: Once | INTRAMUSCULAR | Status: AC
  Administered 2020-06-27: 768 mg via INTRAVENOUS
  Filled 2020-06-27: qty 38.4

## 2020-06-27 MED ORDER — SODIUM CHLORIDE 0.9 % IV SOLN
150.0000 mg/m2 | Freq: Once | INTRAVENOUS | Status: AC
  Administered 2020-06-27: 280 mg via INTRAVENOUS
  Filled 2020-06-27: qty 14

## 2020-06-27 MED ORDER — ATROPINE SULFATE 1 MG/ML IJ SOLN
0.5000 mg | Freq: Once | INTRAMUSCULAR | Status: DC | PRN
  Filled 2020-06-27: qty 1

## 2020-06-27 MED ORDER — PALONOSETRON HCL INJECTION 0.25 MG/5ML
0.2500 mg | Freq: Once | INTRAVENOUS | Status: AC
Start: 2020-06-27 — End: 2020-06-27
  Administered 2020-06-27: 0.25 mg via INTRAVENOUS
  Filled 2020-06-27: qty 5

## 2020-06-27 MED ORDER — SODIUM CHLORIDE 0.9 % IV SOLN
150.0000 mg | Freq: Once | INTRAVENOUS | Status: AC
  Administered 2020-06-27: 150 mg via INTRAVENOUS
  Filled 2020-06-27: qty 5

## 2020-06-27 MED ORDER — OXALIPLATIN CHEMO INJECTION 100 MG/20ML
50.0000 mg/m2 | Freq: Once | INTRAVENOUS | Status: AC
  Administered 2020-06-27: 95 mg via INTRAVENOUS
  Filled 2020-06-27: qty 19

## 2020-06-27 MED ORDER — SODIUM CHLORIDE 0.9 % IV SOLN
2000.0000 mg/m2 | INTRAVENOUS | Status: DC
  Administered 2020-06-27: 3850 mg via INTRAVENOUS
  Filled 2020-06-27: qty 77

## 2020-06-27 NOTE — Patient Instructions (Signed)
[facility]    Discharge Instructions:  Thank you for choosing [facility] to provide your oncology and hematology care.   If you have a lab appointment with the [facility], please go directly to the [facility] and check in at the registration area.   Wear comfortable clothing and clothing appropriate for easy access to any Portacath or PICC line.   We strive to give you quality time with your provider. You may need to reschedule your appointment if you arrive late (15 or more minutes).  Arriving late affects you and other patients whose appointments are after yours.  Also, if you miss three or more appointments without notifying the office, you may be dismissed from the clinic at the provider's discretion.      For prescription refill requests, have your pharmacy contact our office and allow 72 hours for refills to be completed.    Today you received the following chemotherapy and/or immunotherapy agents Oxaliplatin (ELOXATIN), Irinotecan (CAMPTOSAR), Leucovorin & Flourouracil (ADRUCIL).     To help prevent nausea and vomiting after your treatment, we encourage you to take your nausea medication as directed.  BELOW ARE SYMPTOMS THAT SHOULD BE REPORTED IMMEDIATELY: . *FEVER GREATER THAN 100.4 F (38 C) OR HIGHER . *CHILLS OR SWEATING . *NAUSEA AND VOMITING THAT IS NOT CONTROLLED WITH YOUR NAUSEA MEDICATION . *UNUSUAL SHORTNESS OF BREATH . *UNUSUAL BRUISING OR BLEEDING . *URINARY PROBLEMS (pain or burning when urinating, or frequent urination) . *BOWEL PROBLEMS (unusual diarrhea, constipation, pain near the anus) . TENDERNESS IN MOUTH AND THROAT WITH OR WITHOUT PRESENCE OF ULCERS (sore throat, sores in mouth, or a toothache) . UNUSUAL RASH, SWELLING OR PAIN  . UNUSUAL VAGINAL DISCHARGE OR ITCHING   Items with * indicate a potential emergency and should be followed up as soon as possible or go to the Emergency Department if any problems  should occur.  Please show the CHEMOTHERAPY ALERT CARD or IMMUNOTHERAPY ALERT CARD at check-in to the Emergency Department and triage nurse.  Should you have questions after your visit or need to cancel or reschedule your appointment, please contact [facility]  Dept: [phone]  and follow the prompts.  Office hours are 8:00 a.m. to 4:30 p.m. Monday - Friday. Please note that voicemails left after 4:00 p.m. may not be returned until the following business day.  We are closed weekends and major holidays. You have access to a nurse at all times for urgent questions. Please call the main number to the clinic Dept: [phone] and follow the prompts.   For any non-urgent questions, you may also contact your provider using MyChart. We now offer e-Visits for anyone 52 and older to request care online for non-urgent symptoms. For details visit [email or web address].   Also download the MyChart app! Go to the app store, search "MyChart", open the app, select [facility], and log in with your MyChart username and password.  Due to Covid, a mask is required upon entering the hospital/clinic. If you do not have a mask, one will be given to you upon arrival. For doctor visits, patients may have 1 support person aged 13 or older with them. For treatment visits, patients cannot have anyone with them due to current Covid guidelines and our immunocompromised population.   Oxaliplatin Injection What is this medicine? OXALIPLATIN (ox AL i PLA tin) is a chemotherapy drug. It targets fast dividing cells, like cancer cells, and causes these cells to die.  This medicine is used to treat cancers of the colon and rectum, and many other cancers. This medicine may be used for other purposes; ask your health care provider or pharmacist if you have questions. COMMON BRAND NAME(S): Eloxatin What should I tell my health care provider before I take this medicine? They need to know if you have any  of these conditions:  heart disease  history of irregular heartbeat  liver disease  low blood counts, like white cells, platelets, or red blood cells  lung or breathing disease, like asthma  take medicines that treat or prevent blood clots  tingling of the fingers or toes, or other nerve disorder  an unusual or allergic reaction to oxaliplatin, other chemotherapy, other medicines, foods, dyes, or preservatives  pregnant or trying to get pregnant  breast-feeding How should I use this medicine? This drug is given as an infusion into a vein. It is administered in a hospital or clinic by a specially trained health care professional. Talk to your pediatrician regarding the use of this medicine in children. Special care may be needed. Overdosage: If you think you have taken too much of this medicine contact a poison control center or emergency room at once. NOTE: This medicine is only for you. Do not share this medicine with others. What if I miss a dose? It is important not to miss a dose. Call your doctor or health care professional if you are unable to keep an appointment. What may interact with this medicine? Do not take this medicine with any of the following medications:  cisapride  dronedarone  pimozide  thioridazine This medicine may also interact with the following medications:  aspirin and aspirin-like medicines  certain medicines that treat or prevent blood clots like warfarin, apixaban, dabigatran, and rivaroxaban  cisplatin  cyclosporine  diuretics  medicines for infection like acyclovir, adefovir, amphotericin B, bacitracin, cidofovir, foscarnet, ganciclovir, gentamicin, pentamidine, vancomycin  NSAIDs, medicines for pain and inflammation, like ibuprofen or naproxen  other medicines that prolong the QT interval (an abnormal heart rhythm)  pamidronate  zoledronic acid This list may not describe all possible interactions. Give your health care provider  a list of all the medicines, herbs, non-prescription drugs, or dietary supplements you use. Also tell them if you smoke, drink alcohol, or use illegal drugs. Some items may interact with your medicine. What should I watch for while using this medicine? Your condition will be monitored carefully while you are receiving this medicine. You may need blood work done while you are taking this medicine. This medicine may make you feel generally unwell. This is not uncommon as chemotherapy can affect healthy cells as well as cancer cells. Report any side effects. Continue your course of treatment even though you feel ill unless your healthcare professional tells you to stop. This medicine can make you more sensitive to cold. Do not drink cold drinks or use ice. Cover exposed skin before coming in contact with cold temperatures or cold objects. When out in cold weather wear warm clothing and cover your mouth and nose to warm the air that goes into your lungs. Tell your doctor if you get sensitive to the cold. Do not become pregnant while taking this medicine or for 9 months after stopping it. Women should inform their health care professional if they wish to become pregnant or think they might be pregnant. Men should not father a child while taking this medicine and for 6 months after stopping it. There is potential for serious  side effects to an unborn child. Talk to your health care professional for more information. Do not breast-feed a child while taking this medicine or for 3 months after stopping it. This medicine has caused ovarian failure in some women. This medicine may make it more difficult to get pregnant. Talk to your health care professional if you are concerned about your fertility. This medicine has caused decreased sperm counts in some men. This may make it more difficult to father a child. Talk to your health care professional if you are concerned about your fertility. This medicine may increase  your risk of getting an infection. Call your health care professional for advice if you get a fever, chills, or sore throat, or other symptoms of a cold or flu. Do not treat yourself. Try to avoid being around people who are sick. Avoid taking medicines that contain aspirin, acetaminophen, ibuprofen, naproxen, or ketoprofen unless instructed by your health care professional. These medicines may hide a fever. Be careful brushing or flossing your teeth or using a toothpick because you may get an infection or bleed more easily. If you have any dental work done, tell your dentist you are receiving this medicine. What side effects may I notice from receiving this medicine? Side effects that you should report to your doctor or health care professional as soon as possible:  allergic reactions like skin rash, itching or hives, swelling of the face, lips, or tongue  breathing problems  cough  low blood counts - this medicine may decrease the number of white blood cells, red blood cells, and platelets. You may be at increased risk for infections and bleeding  nausea, vomiting  pain, redness, or irritation at site where injected  pain, tingling, numbness in the hands or feet  signs and symptoms of bleeding such as bloody or black, tarry stools; red or dark brown urine; spitting up blood or brown material that looks like coffee grounds; red spots on the skin; unusual bruising or bleeding from the eyes, gums, or nose  signs and symptoms of a dangerous change in heartbeat or heart rhythm like chest pain; dizziness; fast, irregular heartbeat; palpitations; feeling faint or lightheaded; falls  signs and symptoms of infection like fever; chills; cough; sore throat; pain or trouble passing urine  signs and symptoms of liver injury like dark yellow or brown urine; general ill feeling or flu-like symptoms; light-colored stools; loss of appetite; nausea; right upper belly pain; unusually weak or tired;  yellowing of the eyes or skin  signs and symptoms of low red blood cells or anemia such as unusually weak or tired; feeling faint or lightheaded; falls  signs and symptoms of muscle injury like dark urine; trouble passing urine or change in the amount of urine; unusually weak or tired; muscle pain; back pain Side effects that usually do not require medical attention (report to your doctor or health care professional if they continue or are bothersome):  changes in taste  diarrhea  gas  hair loss  loss of appetite  mouth sores This list may not describe all possible side effects. Call your doctor for medical advice about side effects. You may report side effects to FDA at 1-800-FDA-1088. Where should I keep my medicine? This drug is given in a hospital or clinic and will not be stored at home. NOTE: This sheet is a summary. It may not cover all possible information. If you have questions about this medicine, talk to your doctor, pharmacist, or health care provider.  2021 Elsevier/Gold Standard (2018-06-02 12:20:35)  Irinotecan injection What is this medicine? IRINOTECAN (ir in oh TEE kan ) is a chemotherapy drug. It is used to treat colon and rectal cancer. This medicine may be used for other purposes; ask your health care provider or pharmacist if you have questions. COMMON BRAND NAME(S): Camptosar What should I tell my health care provider before I take this medicine? They need to know if you have any of these conditions:  dehydration  diarrhea  infection (especially a virus infection such as chickenpox, cold sores, or herpes)  liver disease  low blood counts, like low white cell, platelet, or red cell counts  low levels of calcium, magnesium, or potassium in the blood  recent or ongoing radiation therapy  an unusual or allergic reaction to irinotecan, other medicines, foods, dyes, or preservatives  pregnant or trying to get pregnant  breast-feeding How should I  use this medicine? This drug is given as an infusion into a vein. It is administered in a hospital or clinic by a specially trained health care professional. Talk to your pediatrician regarding the use of this medicine in children. Special care may be needed. Overdosage: If you think you have taken too much of this medicine contact a poison control center or emergency room at once. NOTE: This medicine is only for you. Do not share this medicine with others. What if I miss a dose? It is important not to miss your dose. Call your doctor or health care professional if you are unable to keep an appointment. What may interact with this medicine? Do not take this medicine with any of the following medications:  cobicistat  itraconazole This medicine may interact with the following medications:  antiviral medicines for HIV or AIDS  certain antibiotics like rifampin or rifabutin  certain medicines for fungal infections like ketoconazole, posaconazole, and voriconazole  certain medicines for seizures like carbamazepine, phenobarbital, phenotoin  clarithromycin  gemfibrozil  nefazodone  St. John's Wort This list may not describe all possible interactions. Give your health care provider a list of all the medicines, herbs, non-prescription drugs, or dietary supplements you use. Also tell them if you smoke, drink alcohol, or use illegal drugs. Some items may interact with your medicine. What should I watch for while using this medicine? Your condition will be monitored carefully while you are receiving this medicine. You will need important blood work done while you are taking this medicine. This drug may make you feel generally unwell. This is not uncommon, as chemotherapy can affect healthy cells as well as cancer cells. Report any side effects. Continue your course of treatment even though you feel ill unless your doctor tells you to stop. In some cases, you may be given additional medicines  to help with side effects. Follow all directions for their use. You may get drowsy or dizzy. Do not drive, use machinery, or do anything that needs mental alertness until you know how this medicine affects you. Do not stand or sit up quickly, especially if you are an older patient. This reduces the risk of dizzy or fainting spells. Call your health care professional for advice if you get a fever, chills, or sore throat, or other symptoms of a cold or flu. Do not treat yourself. This medicine decreases your body's ability to fight infections. Try to avoid being around people who are sick. Avoid taking products that contain aspirin, acetaminophen, ibuprofen, naproxen, or ketoprofen unless instructed by your doctor. These medicines may hide a  fever. This medicine may increase your risk to bruise or bleed. Call your doctor or health care professional if you notice any unusual bleeding. Be careful brushing and flossing your teeth or using a toothpick because you may get an infection or bleed more easily. If you have any dental work done, tell your dentist you are receiving this medicine. Do not become pregnant while taking this medicine or for 6 months after stopping it. Women should inform their health care professional if they wish to become pregnant or think they might be pregnant. Men should not father a child while taking this medicine and for 3 months after stopping it. There is potential for serious side effects to an unborn child. Talk to your health care professional for more information. Do not breast-feed an infant while taking this medicine or for 7 days after stopping it. This medicine has caused ovarian failure in some women. This medicine may make it more difficult to get pregnant. Talk to your health care professional if you are concerned about your fertility. This medicine has caused decreased sperm counts in some men. This may make it more difficult to father a child. Talk to your health care  professional if you are concerned about your fertility. What side effects may I notice from receiving this medicine? Side effects that you should report to your doctor or health care professional as soon as possible:  allergic reactions like skin rash, itching or hives, swelling of the face, lips, or tongue  chest pain  diarrhea  flushing, runny nose, sweating during infusion  low blood counts - this medicine may decrease the number of white blood cells, red blood cells and platelets. You may be at increased risk for infections and bleeding.  nausea, vomiting  pain, swelling, warmth in the leg  signs of decreased platelets or bleeding - bruising, pinpoint red spots on the skin, black, tarry stools, blood in the urine  signs of infection - fever or chills, cough, sore throat, pain or difficulty passing urine  signs of decreased red blood cells - unusually weak or tired, fainting spells, lightheadedness Side effects that usually do not require medical attention (report to your doctor or health care professional if they continue or are bothersome):  constipation  hair loss  headache  loss of appetite  mouth sores  stomach pain This list may not describe all possible side effects. Call your doctor for medical advice about side effects. You may report side effects to FDA at 1-800-FDA-1088. Where should I keep my medicine? This drug is given in a hospital or clinic and will not be stored at home. NOTE: This sheet is a summary. It may not cover all possible information. If you have questions about this medicine, talk to your doctor, pharmacist, or health care provider.  2021 Elsevier/Gold Standard (2018-12-14 17:46:13)  Leucovorin injection What is this medicine? LEUCOVORIN (loo koe VOR in) is used to prevent or treat the harmful effects of some medicines. This medicine is used to treat anemia caused by a low amount of folic acid in the body. It is also used with 5-fluorouracil  (5-FU) to treat colon cancer. This medicine may be used for other purposes; ask your health care provider or pharmacist if you have questions. What should I tell my health care provider before I take this medicine? They need to know if you have any of these conditions:  anemia from low levels of vitamin B-12 in the blood  an unusual or allergic reaction to  leucovorin, folic acid, other medicines, foods, dyes, or preservatives  pregnant or trying to get pregnant  breast-feeding How should I use this medicine? This medicine is for injection into a muscle or into a vein. It is given by a health care professional in a hospital or clinic setting. Talk to your pediatrician regarding the use of this medicine in children. Special care may be needed. Overdosage: If you think you have taken too much of this medicine contact a poison control center or emergency room at once. NOTE: This medicine is only for you. Do not share this medicine with others. What if I miss a dose? This does not apply. What may interact with this medicine?  capecitabine  fluorouracil  phenobarbital  phenytoin  primidone  trimethoprim-sulfamethoxazole This list may not describe all possible interactions. Give your health care provider a list of all the medicines, herbs, non-prescription drugs, or dietary supplements you use. Also tell them if you smoke, drink alcohol, or use illegal drugs. Some items may interact with your medicine. What should I watch for while using this medicine? Your condition will be monitored carefully while you are receiving this medicine. This medicine may increase the side effects of 5-fluorouracil, 5-FU. Tell your doctor or health care professional if you have diarrhea or mouth sores that do not get better or that get worse. What side effects may I notice from receiving this medicine? Side effects that you should report to your doctor or health care professional as soon as  possible:  allergic reactions like skin rash, itching or hives, swelling of the face, lips, or tongue  breathing problems  fever, infection  mouth sores  unusual bleeding or bruising  unusually weak or tired Side effects that usually do not require medical attention (report to your doctor or health care professional if they continue or are bothersome):  constipation or diarrhea  loss of appetite  nausea, vomiting This list may not describe all possible side effects. Call your doctor for medical advice about side effects. You may report side effects to FDA at 1-800-FDA-1088. Where should I keep my medicine? This drug is given in a hospital or clinic and will not be stored at home. NOTE: This sheet is a summary. It may not cover all possible information. If you have questions about this medicine, talk to your doctor, pharmacist, or health care provider.  2021 Elsevier/Gold Standard (2007-07-20 16:50:29)  Fluorouracil, 5-FU injection What is this medicine? FLUOROURACIL, 5-FU (flure oh YOOR a sil) is a chemotherapy drug. It slows the growth of cancer cells. This medicine is used to treat many types of cancer like breast cancer, colon or rectal cancer, pancreatic cancer, and stomach cancer. This medicine may be used for other purposes; ask your health care provider or pharmacist if you have questions. COMMON BRAND NAME(S): Adrucil What should I tell my health care provider before I take this medicine? They need to know if you have any of these conditions:  blood disorders  dihydropyrimidine dehydrogenase (DPD) deficiency  infection (especially a virus infection such as chickenpox, cold sores, or herpes)  kidney disease  liver disease  malnourished, poor nutrition  recent or ongoing radiation therapy  an unusual or allergic reaction to fluorouracil, other chemotherapy, other medicines, foods, dyes, or preservatives  pregnant or trying to get  pregnant  breast-feeding How should I use this medicine? This drug is given as an infusion or injection into a vein. It is administered in a hospital or clinic by a specially trained  health care professional. Talk to your pediatrician regarding the use of this medicine in children. Special care may be needed. Overdosage: If you think you have taken too much of this medicine contact a poison control center or emergency room at once. NOTE: This medicine is only for you. Do not share this medicine with others. What if I miss a dose? It is important not to miss your dose. Call your doctor or health care professional if you are unable to keep an appointment. What may interact with this medicine? Do not take this medicine with any of the following medications:  live virus vaccines This medicine may also interact with the following medications:  medicines that treat or prevent blood clots like warfarin, enoxaparin, and dalteparin This list may not describe all possible interactions. Give your health care provider a list of all the medicines, herbs, non-prescription drugs, or dietary supplements you use. Also tell them if you smoke, drink alcohol, or use illegal drugs. Some items may interact with your medicine. What should I watch for while using this medicine? Visit your doctor for checks on your progress. This drug may make you feel generally unwell. This is not uncommon, as chemotherapy can affect healthy cells as well as cancer cells. Report any side effects. Continue your course of treatment even though you feel ill unless your doctor tells you to stop. In some cases, you may be given additional medicines to help with side effects. Follow all directions for their use. Call your doctor or health care professional for advice if you get a fever, chills or sore throat, or other symptoms of a cold or flu. Do not treat yourself. This drug decreases your body's ability to fight infections. Try to avoid  being around people who are sick. This medicine may increase your risk to bruise or bleed. Call your doctor or health care professional if you notice any unusual bleeding. Be careful brushing and flossing your teeth or using a toothpick because you may get an infection or bleed more easily. If you have any dental work done, tell your dentist you are receiving this medicine. Avoid taking products that contain aspirin, acetaminophen, ibuprofen, naproxen, or ketoprofen unless instructed by your doctor. These medicines may hide a fever. Do not become pregnant while taking this medicine. Women should inform their doctor if they wish to become pregnant or think they might be pregnant. There is a potential for serious side effects to an unborn child. Talk to your health care professional or pharmacist for more information. Do not breast-feed an infant while taking this medicine. Men should inform their doctor if they wish to father a child. This medicine may lower sperm counts. Do not treat diarrhea with over the counter products. Contact your doctor if you have diarrhea that lasts more than 2 days or if it is severe and watery. This medicine can make you more sensitive to the sun. Keep out of the sun. If you cannot avoid being in the sun, wear protective clothing and use sunscreen. Do not use sun lamps or tanning beds/booths. What side effects may I notice from receiving this medicine? Side effects that you should report to your doctor or health care professional as soon as possible:  allergic reactions like skin rash, itching or hives, swelling of the face, lips, or tongue  low blood counts - this medicine may decrease the number of white blood cells, red blood cells and platelets. You may be at increased risk for infections and bleeding.  signs of infection - fever or chills, cough, sore throat, pain or difficulty passing urine  signs of decreased platelets or bleeding - bruising, pinpoint red spots on  the skin, black, tarry stools, blood in the urine  signs of decreased red blood cells - unusually weak or tired, fainting spells, lightheadedness  breathing problems  changes in vision  chest pain  mouth sores  nausea and vomiting  pain, swelling, redness at site where injected  pain, tingling, numbness in the hands or feet  redness, swelling, or sores on hands or feet  stomach pain  unusual bleeding Side effects that usually do not require medical attention (report to your doctor or health care professional if they continue or are bothersome):  changes in finger or toe nails  diarrhea  dry or itchy skin  hair loss  headache  loss of appetite  sensitivity of eyes to the light  stomach upset  unusually teary eyes This list may not describe all possible side effects. Call your doctor for medical advice about side effects. You may report side effects to FDA at 1-800-FDA-1088. Where should I keep my medicine? This drug is given in a hospital or clinic and will not be stored at home. NOTE: This sheet is a summary. It may not cover all possible information. If you have questions about this medicine, talk to your doctor, pharmacist, or health care provider.  2021 Elsevier/Gold Standard (2018-12-14 15:00:03)

## 2020-06-27 NOTE — Telephone Encounter (Signed)
V/M message left for Pt informing him that Dr. Benay Spice would like him to come in on Friday to get Udenyca injection. Left message that [place] was 1.6 and to return call so I can explain further and to schedule time when he can come in.

## 2020-06-29 ENCOUNTER — Telehealth: Payer: Self-pay

## 2020-06-29 ENCOUNTER — Ambulatory Visit: Payer: Medicare HMO

## 2020-06-29 ENCOUNTER — Inpatient Hospital Stay: Payer: Medicare HMO

## 2020-06-29 ENCOUNTER — Other Ambulatory Visit: Payer: Self-pay

## 2020-06-29 VITALS — BP 121/78 | HR 65 | Temp 98.6°F | Resp 18

## 2020-06-29 DIAGNOSIS — D696 Thrombocytopenia, unspecified: Secondary | ICD-10-CM | POA: Diagnosis not present

## 2020-06-29 DIAGNOSIS — Z794 Long term (current) use of insulin: Secondary | ICD-10-CM | POA: Diagnosis not present

## 2020-06-29 DIAGNOSIS — Z5111 Encounter for antineoplastic chemotherapy: Secondary | ICD-10-CM | POA: Diagnosis not present

## 2020-06-29 DIAGNOSIS — C250 Malignant neoplasm of head of pancreas: Secondary | ICD-10-CM | POA: Diagnosis not present

## 2020-06-29 DIAGNOSIS — K831 Obstruction of bile duct: Secondary | ICD-10-CM | POA: Diagnosis not present

## 2020-06-29 DIAGNOSIS — H409 Unspecified glaucoma: Secondary | ICD-10-CM | POA: Diagnosis not present

## 2020-06-29 DIAGNOSIS — E119 Type 2 diabetes mellitus without complications: Secondary | ICD-10-CM | POA: Diagnosis not present

## 2020-06-29 DIAGNOSIS — E785 Hyperlipidemia, unspecified: Secondary | ICD-10-CM | POA: Diagnosis not present

## 2020-06-29 MED ORDER — PEGFILGRASTIM-CBQV 6 MG/0.6ML ~~LOC~~ SOSY
6.0000 mg | PREFILLED_SYRINGE | Freq: Once | SUBCUTANEOUS | Status: AC
  Administered 2020-06-29: 6 mg via SUBCUTANEOUS

## 2020-06-29 NOTE — Patient Instructions (Signed)
Pegfilgrastim injection What is this medicine? PEGFILGRASTIM (PEG fil gra stim) is a long-acting granulocyte colony-stimulating factor that stimulates the growth of neutrophils, a type of white blood cell important in the body's fight against infection. It is used to reduce the incidence of fever and infection in patients with certain types of cancer who are receiving chemotherapy that affects the bone marrow, and to increase survival after being exposed to high doses of radiation. This medicine may be used for other purposes; ask your health care provider or pharmacist if you have questions. COMMON BRAND NAME(S): Fulphila, Neulasta, Nyvepria, UDENYCA, Ziextenzo What should I tell my health care provider before I take this medicine? They need to know if you have any of these conditions:  kidney disease  latex allergy  ongoing radiation therapy  sickle cell disease  skin reactions to acrylic adhesives (On-Body Injector only)  an unusual or allergic reaction to pegfilgrastim, filgrastim, other medicines, foods, dyes, or preservatives  pregnant or trying to get pregnant  breast-feeding How should I use this medicine? This medicine is for injection under the skin. If you get this medicine at home, you will be taught how to prepare and give the pre-filled syringe or how to use the On-body Injector. Refer to the patient Instructions for Use for detailed instructions. Use exactly as directed. Tell your healthcare provider immediately if you suspect that the On-body Injector may not have performed as intended or if you suspect the use of the On-body Injector resulted in a missed or partial dose. It is important that you put your used needles and syringes in a special sharps container. Do not put them in a trash can. If you do not have a sharps container, call your pharmacist or healthcare provider to get one. Talk to your pediatrician regarding the use of this medicine in children. While this drug  may be prescribed for selected conditions, precautions do apply. Overdosage: If you think you have taken too much of this medicine contact a poison control center or emergency room at once. NOTE: This medicine is only for you. Do not share this medicine with others. What if I miss a dose? It is important not to miss your dose. Call your doctor or health care professional if you miss your dose. If you miss a dose due to an On-body Injector failure or leakage, a new dose should be administered as soon as possible using a single prefilled syringe for manual use. What may interact with this medicine? Interactions have not been studied. This list may not describe all possible interactions. Give your health care provider a list of all the medicines, herbs, non-prescription drugs, or dietary supplements you use. Also tell them if you smoke, drink alcohol, or use illegal drugs. Some items may interact with your medicine. What should I watch for while using this medicine? Your condition will be monitored carefully while you are receiving this medicine. You may need blood work done while you are taking this medicine. Talk to your health care provider about your risk of cancer. You may be more at risk for certain types of cancer if you take this medicine. If you are going to need a MRI, CT scan, or other procedure, tell your doctor that you are using this medicine (On-Body Injector only). What side effects may I notice from receiving this medicine? Side effects that you should report to your doctor or health care professional as soon as possible:  allergic reactions (skin rash, itching or hives, swelling of   the face, lips, or tongue)  back pain  dizziness  fever  pain, redness, or irritation at site where injected  pinpoint red spots on the skin  red or dark-brown urine  shortness of breath or breathing problems  stomach or side pain, or pain at the shoulder  swelling  tiredness  trouble  passing urine or change in the amount of urine  unusual bruising or bleeding Side effects that usually do not require medical attention (report to your doctor or health care professional if they continue or are bothersome):  bone pain  muscle pain This list may not describe all possible side effects. Call your doctor for medical advice about side effects. You may report side effects to FDA at 1-800-FDA-1088. Where should I keep my medicine? Keep out of the reach of children. If you are using this medicine at home, you will be instructed on how to store it. Throw away any unused medicine after the expiration date on the label. NOTE: This sheet is a summary. It may not cover all possible information. If you have questions about this medicine, talk to your doctor, pharmacist, or health care provider.  2021 Elsevier/Gold Standard (2019-02-04 13:20:51)  

## 2020-06-29 NOTE — Telephone Encounter (Signed)
TC to [employer] prior authorization [id number] spoke with representative Tim Lair who confirmed that Margarette [place] was approved for 6 months effective 06/28/20-12/28/20

## 2020-07-02 ENCOUNTER — Other Ambulatory Visit: Payer: Self-pay | Admitting: Gastroenterology

## 2020-07-03 ENCOUNTER — Encounter: Payer: Self-pay | Admitting: General Practice

## 2020-07-03 DIAGNOSIS — R590 Localized enlarged lymph nodes: Secondary | ICD-10-CM | POA: Diagnosis not present

## 2020-07-03 DIAGNOSIS — C250 Malignant neoplasm of head of pancreas: Secondary | ICD-10-CM | POA: Diagnosis not present

## 2020-07-03 NOTE — Progress Notes (Signed)
[facility] Spiritual Care Note  Following Mr Scheurich wife Larena Glassman for caregiver support.   [place], [place], [facility] Pager [phone] Voicemail [phone]

## 2020-07-04 ENCOUNTER — Other Ambulatory Visit: Payer: Medicare HMO

## 2020-07-04 ENCOUNTER — Ambulatory Visit: Payer: Medicare HMO | Admitting: Nurse Practitioner

## 2020-07-04 ENCOUNTER — Ambulatory Visit: Payer: Medicare HMO

## 2020-07-08 ENCOUNTER — Other Ambulatory Visit: Payer: Self-pay | Admitting: Oncology

## 2020-07-09 DIAGNOSIS — Z9221 Personal history of antineoplastic chemotherapy: Secondary | ICD-10-CM | POA: Diagnosis not present

## 2020-07-09 DIAGNOSIS — I878 Other specified disorders of veins: Secondary | ICD-10-CM | POA: Diagnosis not present

## 2020-07-09 DIAGNOSIS — C250 Malignant neoplasm of head of pancreas: Secondary | ICD-10-CM | POA: Diagnosis not present

## 2020-07-09 DIAGNOSIS — G129 Spinal muscular atrophy, unspecified: Secondary | ICD-10-CM | POA: Diagnosis not present

## 2020-07-09 DIAGNOSIS — E119 Type 2 diabetes mellitus without complications: Secondary | ICD-10-CM | POA: Diagnosis not present

## 2020-07-09 DIAGNOSIS — K8681 Exocrine pancreatic insufficiency: Secondary | ICD-10-CM | POA: Diagnosis not present

## 2020-07-12 ENCOUNTER — Inpatient Hospital Stay: Payer: Medicare HMO

## 2020-07-12 ENCOUNTER — Inpatient Hospital Stay: Payer: Medicare HMO | Admitting: Nurse Practitioner

## 2020-07-12 ENCOUNTER — Other Ambulatory Visit: Payer: Self-pay | Admitting: *Deleted

## 2020-07-12 ENCOUNTER — Encounter: Payer: Self-pay | Admitting: Nurse Practitioner

## 2020-07-12 ENCOUNTER — Inpatient Hospital Stay (HOSPITAL_BASED_OUTPATIENT_CLINIC_OR_DEPARTMENT_OTHER): Payer: Medicare HMO | Admitting: Nurse Practitioner

## 2020-07-12 ENCOUNTER — Other Ambulatory Visit: Payer: Self-pay

## 2020-07-12 DIAGNOSIS — Z794 Long term (current) use of insulin: Secondary | ICD-10-CM | POA: Diagnosis not present

## 2020-07-12 DIAGNOSIS — H409 Unspecified glaucoma: Secondary | ICD-10-CM | POA: Diagnosis not present

## 2020-07-12 DIAGNOSIS — C259 Malignant neoplasm of pancreas, unspecified: Secondary | ICD-10-CM

## 2020-07-12 DIAGNOSIS — C250 Malignant neoplasm of head of pancreas: Secondary | ICD-10-CM

## 2020-07-12 DIAGNOSIS — E785 Hyperlipidemia, unspecified: Secondary | ICD-10-CM | POA: Diagnosis not present

## 2020-07-12 DIAGNOSIS — Z5111 Encounter for antineoplastic chemotherapy: Secondary | ICD-10-CM | POA: Diagnosis not present

## 2020-07-12 DIAGNOSIS — K831 Obstruction of bile duct: Secondary | ICD-10-CM | POA: Diagnosis not present

## 2020-07-12 DIAGNOSIS — E119 Type 2 diabetes mellitus without complications: Secondary | ICD-10-CM | POA: Diagnosis not present

## 2020-07-12 DIAGNOSIS — D696 Thrombocytopenia, unspecified: Secondary | ICD-10-CM | POA: Diagnosis not present

## 2020-07-12 LAB — CMP (CANCER CENTER ONLY)
ALT: 30 U/L (ref 0–44)
AST: 25 U/L (ref 15–41)
Albumin: 3.9 g/dL (ref 3.5–5.0)
Alkaline Phosphatase: 125 U/L (ref 38–126)
Anion gap: 7 (ref 5–15)
BUN: 16 mg/dL (ref 8–23)
CO2: 27 mmol/L (ref 22–32)
Calcium: 8.8 mg/dL — ABNORMAL LOW (ref 8.9–10.3)
Chloride: 105 mmol/L (ref 98–111)
Creatinine: 0.71 mg/dL (ref 0.61–1.24)
GFR, Estimated: >60 mL/min (ref >60)
Glucose, Bld: 127 mg/dL — ABNORMAL HIGH (ref 70–99)
Potassium: 4.2 mmol/L (ref 3.5–5.1)
Sodium: 139 mmol/L (ref 135–145)
Total Bilirubin: 0.4 mg/dL (ref 0.3–1.2)
Total Protein: 6 g/dL — ABNORMAL LOW (ref 6.5–8.1)

## 2020-07-12 LAB — CBC WITH DIFFERENTIAL (CANCER CENTER ONLY)
Abs Immature Granulocytes: 2.21 10*3/uL — ABNORMAL HIGH (ref 0.00–0.07)
Basophils Absolute: 0.2 10*3/uL — ABNORMAL HIGH (ref 0.0–0.1)
Basophils Relative: 1 %
Eosinophils Absolute: 0 10*3/uL (ref 0.0–0.5)
Eosinophils Relative: 0 %
HCT: 41.7 % (ref 39.0–52.0)
Hemoglobin: 14 g/dL (ref 13.0–17.0)
Immature Granulocytes: 12 %
Lymphocytes Relative: 23 %
Lymphs Abs: 4.2 10*3/uL — ABNORMAL HIGH (ref 0.7–4.0)
MCH: 31.1 pg (ref 26.0–34.0)
MCHC: 33.6 g/dL (ref 30.0–36.0)
MCV: 92.7 fL (ref 80.0–100.0)
Monocytes Absolute: 1 10*3/uL (ref 0.1–1.0)
Monocytes Relative: 5 %
Neutro Abs: 10.8 10*3/uL — ABNORMAL HIGH (ref 1.7–7.7)
Neutrophils Relative %: 59 %
Platelet Count: 76 10*3/uL — ABNORMAL LOW (ref 150–400)
RBC: 4.5 MIL/uL (ref 4.22–5.81)
RDW: 14.7 % (ref 11.5–15.5)
WBC Count: 18.4 10*3/uL — ABNORMAL HIGH (ref 4.0–10.5)
nRBC: 0 % (ref 0.0–0.2)

## 2020-07-12 MED ORDER — SODIUM CHLORIDE 0.9% FLUSH
10.0000 mL | INTRAVENOUS | Status: DC | PRN
  Administered 2020-07-12: 10 mL
  Filled 2020-07-12: qty 10

## 2020-07-12 MED ORDER — ATROPINE SULFATE 1 MG/ML IJ SOLN: 0.5000 mg | Freq: Once | INTRAMUSCULAR | Status: DC | PRN

## 2020-07-12 MED ORDER — OXALIPLATIN CHEMO INJECTION 100 MG/20ML
50.0000 mg/m2 | Freq: Once | INTRAVENOUS | Status: AC
  Administered 2020-07-12: 95 mg via INTRAVENOUS
  Filled 2020-07-12: qty 19

## 2020-07-12 MED ORDER — SODIUM CHLORIDE 0.9 % IV SOLN
10.0000 mg | Freq: Once | INTRAVENOUS | Status: AC
  Administered 2020-07-12: 10 mg via INTRAVENOUS
  Filled 2020-07-12: qty 1

## 2020-07-12 MED ORDER — SODIUM CHLORIDE 0.9 % IV SOLN
150.0000 mg/m2 | Freq: Once | INTRAVENOUS | Status: AC
  Administered 2020-07-12: 280 mg via INTRAVENOUS
  Filled 2020-07-12: qty 14

## 2020-07-12 MED ORDER — SODIUM CHLORIDE 0.9 % IV SOLN
150.0000 mg | Freq: Once | INTRAVENOUS | Status: AC
  Administered 2020-07-12: 150 mg via INTRAVENOUS
  Filled 2020-07-12: qty 5

## 2020-07-12 MED ORDER — SODIUM CHLORIDE 0.9 % IV SOLN
2000.0000 mg/m2 | INTRAVENOUS | Status: DC
  Administered 2020-07-12: 3850 mg via INTRAVENOUS
  Filled 2020-07-12: qty 77

## 2020-07-12 MED ORDER — LEUCOVORIN CALCIUM INJECTION 350 MG: 400.0000 mg/m2 | Freq: Once | INTRAMUSCULAR | Status: DC

## 2020-07-12 MED ORDER — LEUCOVORIN CALCIUM INJECTION 350 MG
400.0000 mg/m2 | Freq: Once | INTRAMUSCULAR | Status: AC
Start: 2020-07-12 — End: 2020-07-12
  Administered 2020-07-12: 768 mg via INTRAVENOUS
  Filled 2020-07-12: qty 38.4

## 2020-07-12 MED ORDER — DEXTROSE 5 % IV SOLN
Freq: Once | INTRAVENOUS | Status: AC
  Filled 2020-07-12: qty 250

## 2020-07-12 MED ORDER — UDENYCA 6 MG/0.6ML ~~LOC~~ SOSY
6.0000 mg | PREFILLED_SYRINGE | SUBCUTANEOUS | 1 refills | Status: DC
Start: 2020-07-12 — End: 2020-07-17

## 2020-07-12 MED ORDER — PALONOSETRON HCL INJECTION 0.25 MG/5ML
0.2500 mg | Freq: Once | INTRAVENOUS | Status: AC
  Administered 2020-07-12: 0.25 mg via INTRAVENOUS
  Filled 2020-07-12: qty 5

## 2020-07-12 MED ORDER — LORAZEPAM 0.5 MG PO TABS: 0.5000 mg | ORAL_TABLET | Freq: Three times a day (TID) | ORAL | 0 refills | Status: DC | PRN

## 2020-07-12 MED ORDER — PROCHLORPERAZINE MALEATE 10 MG PO TABS: 10.0000 mg | ORAL_TABLET | Freq: Four times a day (QID) | ORAL | 2 refills | Status: DC | PRN

## 2020-07-12 NOTE — Patient Instructions (Signed)
[facility]    Discharge Instructions:  Thank you for choosing [facility] to provide your oncology and hematology care.   If you have a lab appointment with the [facility], please go directly to the [facility] and check in at the registration area.   Wear comfortable clothing and clothing appropriate for easy access to any Portacath or PICC line.   We strive to give you quality time with your provider. You may need to reschedule your appointment if you arrive late (15 or more minutes).  Arriving late affects you and other patients whose appointments are after yours.  Also, if you miss three or more appointments without notifying the office, you may be dismissed from the clinic at the provider's discretion.      For prescription refill requests, have your pharmacy contact our office and allow 72 hours for refills to be completed.    Today you received the following chemotherapy and/or immunotherapy agents Oxaliplatin (ELOXATIN), Irinotecan (CAMPTOSAR), Leucovorin & Flourouracil (ADRUCIL).     To help prevent nausea and vomiting after your treatment, we encourage you to take your nausea medication as directed.  BELOW ARE SYMPTOMS THAT SHOULD BE REPORTED IMMEDIATELY: *FEVER GREATER THAN 100.4 F (38 C) OR HIGHER *CHILLS OR SWEATING *NAUSEA AND VOMITING THAT IS NOT CONTROLLED WITH YOUR NAUSEA MEDICATION *UNUSUAL SHORTNESS OF BREATH *UNUSUAL BRUISING OR BLEEDING *URINARY PROBLEMS (pain or burning when urinating, or frequent urination) *BOWEL PROBLEMS (unusual diarrhea, constipation, pain near the anus) TENDERNESS IN MOUTH AND THROAT WITH OR WITHOUT PRESENCE OF ULCERS (sore throat, sores in mouth, or a toothache) UNUSUAL RASH, SWELLING OR PAIN  UNUSUAL VAGINAL DISCHARGE OR ITCHING   Items with * indicate a potential emergency and should be followed up as soon as possible or go to the Emergency Department if any problems should occur.  Please  show the CHEMOTHERAPY ALERT CARD or IMMUNOTHERAPY ALERT CARD at check-in to the Emergency Department and triage nurse.  Should you have questions after your visit or need to cancel or reschedule your appointment, please contact [facility]  Dept: [phone]  and follow the prompts.  Office hours are 8:00 a.m. to 4:30 p.m. Monday - Friday. Please note that voicemails left after 4:00 p.m. may not be returned until the following business day.  We are closed weekends and major holidays. You have access to a nurse at all times for urgent questions. Please call the main number to the clinic Dept: [phone] and follow the prompts.   For any non-urgent questions, you may also contact your provider using MyChart. We now offer e-Visits for anyone 56 and older to request care online for non-urgent symptoms. For details visit [email or web address].   Also download the MyChart app! Go to the app store, search "MyChart", open the app, select [facility], and log in with your MyChart username and password.  Due to Covid, a mask is required upon entering the hospital/clinic. If you do not have a mask, one will be given to you upon arrival. For doctor visits, patients may have 1 support person aged 89 or older with them. For treatment visits, patients cannot have anyone with them due to current Covid guidelines and our immunocompromised population.   Oxaliplatin Injection What is this medicine? OXALIPLATIN (ox AL i PLA tin) is a chemotherapy drug. It targets fast dividing cells, like cancer cells, and causes these cells to die. This medicine is used to treat cancers of the colon  and rectum, and many other cancers. This medicine may be used for other purposes; ask your health care provider or pharmacist if you have questions. COMMON BRAND NAME(S): Eloxatin What should I tell my health care provider before I take this medicine? They need to know if you have any of these  conditions: heart disease history of irregular heartbeat liver disease low blood counts, like white cells, platelets, or red blood cells lung or breathing disease, like asthma take medicines that treat or prevent blood clots tingling of the fingers or toes, or other nerve disorder an unusual or allergic reaction to oxaliplatin, other chemotherapy, other medicines, foods, dyes, or preservatives pregnant or trying to get pregnant breast-feeding How should I use this medicine? This drug is given as an infusion into a vein. It is administered in a hospital or clinic by a specially trained health care professional. Talk to your pediatrician regarding the use of this medicine in children. Special care may be needed. Overdosage: If you think you have taken too much of this medicine contact a poison control center or emergency room at once. NOTE: This medicine is only for you. Do not share this medicine with others. What if I miss a dose? It is important not to miss a dose. Call your doctor or health care professional if you are unable to keep an appointment. What may interact with this medicine? Do not take this medicine with any of the following medications: cisapride dronedarone pimozide thioridazine This medicine may also interact with the following medications: aspirin and aspirin-like medicines certain medicines that treat or prevent blood clots like warfarin, apixaban, dabigatran, and rivaroxaban cisplatin cyclosporine diuretics medicines for infection like acyclovir, adefovir, amphotericin B, bacitracin, cidofovir, foscarnet, ganciclovir, gentamicin, pentamidine, vancomycin NSAIDs, medicines for pain and inflammation, like ibuprofen or naproxen other medicines that prolong the QT interval (an abnormal heart rhythm) pamidronate zoledronic acid This list may not describe all possible interactions. Give your health care provider a list of all the medicines, herbs, non-prescription  drugs, or dietary supplements you use. Also tell them if you smoke, drink alcohol, or use illegal drugs. Some items may interact with your medicine. What should I watch for while using this medicine? Your condition will be monitored carefully while you are receiving this medicine. You may need blood work done while you are taking this medicine. This medicine may make you feel generally unwell. This is not uncommon as chemotherapy can affect healthy cells as well as cancer cells. Report any side effects. Continue your course of treatment even though you feel ill unless your healthcare professional tells you to stop. This medicine can make you more sensitive to cold. Do not drink cold drinks or use ice. Cover exposed skin before coming in contact with cold temperatures or cold objects. When out in cold weather wear warm clothing and cover your mouth and nose to warm the air that goes into your lungs. Tell your doctor if you get sensitive to the cold. Do not become pregnant while taking this medicine or for 9 months after stopping it. Women should inform their health care professional if they wish to become pregnant or think they might be pregnant. Men should not father a child while taking this medicine and for 6 months after stopping it. There is potential for serious side effects to an unborn child. Talk to your health care professional for more information. Do not breast-feed a child while taking this medicine or for 3 months after stopping it. This medicine has  caused ovarian failure in some women. This medicine may make it more difficult to get pregnant. Talk to your health care professional if you are concerned about your fertility. This medicine has caused decreased sperm counts in some men. This may make it more difficult to father a child. Talk to your health care professional if you are concerned about your fertility. This medicine may increase your risk of getting an infection. Call your health  care professional for advice if you get a fever, chills, or sore throat, or other symptoms of a cold or flu. Do not treat yourself. Try to avoid being around people who are sick. Avoid taking medicines that contain aspirin, acetaminophen, ibuprofen, naproxen, or ketoprofen unless instructed by your health care professional. These medicines may hide a fever. Be careful brushing or flossing your teeth or using a toothpick because you may get an infection or bleed more easily. If you have any dental work done, tell your dentist you are receiving this medicine. What side effects may I notice from receiving this medicine? Side effects that you should report to your doctor or health care professional as soon as possible: allergic reactions like skin rash, itching or hives, swelling of the face, lips, or tongue breathing problems cough low blood counts - this medicine may decrease the number of white blood cells, red blood cells, and platelets. You may be at increased risk for infections and bleeding nausea, vomiting pain, redness, or irritation at site where injected pain, tingling, numbness in the hands or feet signs and symptoms of bleeding such as bloody or black, tarry stools; red or dark brown urine; spitting up blood or brown material that looks like coffee grounds; red spots on the skin; unusual bruising or bleeding from the eyes, gums, or nose signs and symptoms of a dangerous change in heartbeat or heart rhythm like chest pain; dizziness; fast, irregular heartbeat; palpitations; feeling faint or lightheaded; falls signs and symptoms of infection like fever; chills; cough; sore throat; pain or trouble passing urine signs and symptoms of liver injury like dark yellow or brown urine; general ill feeling or flu-like symptoms; light-colored stools; loss of appetite; nausea; right upper belly pain; unusually weak or tired; yellowing of the eyes or skin signs and symptoms of low red blood cells or anemia  such as unusually weak or tired; feeling faint or lightheaded; falls signs and symptoms of muscle injury like dark urine; trouble passing urine or change in the amount of urine; unusually weak or tired; muscle pain; back pain Side effects that usually do not require medical attention (report to your doctor or health care professional if they continue or are bothersome): changes in taste diarrhea gas hair loss loss of appetite mouth sores This list may not describe all possible side effects. Call your doctor for medical advice about side effects. You may report side effects to FDA at 1-800-FDA-1088. Where should I keep my medicine? This drug is given in a hospital or clinic and will not be stored at home. NOTE: This sheet is a summary. It may not cover all possible information. If you have questions about this medicine, talk to your doctor, pharmacist, or health care provider.  2021 Elsevier/Gold Standard (2018-06-02 12:20:35)  Irinotecan injection What is this medicine? IRINOTECAN (ir in oh TEE kan ) is a chemotherapy drug. It is used to treat colon and rectal cancer. This medicine may be used for other purposes; ask your health care provider or pharmacist if you have questions. COMMON BRAND  NAME(S): Camptosar What should I tell my health care provider before I take this medicine? They need to know if you have any of these conditions: dehydration diarrhea infection (especially a virus infection such as chickenpox, cold sores, or herpes) liver disease low blood counts, like low white cell, platelet, or red cell counts low levels of calcium, magnesium, or potassium in the blood recent or ongoing radiation therapy an unusual or allergic reaction to irinotecan, other medicines, foods, dyes, or preservatives pregnant or trying to get pregnant breast-feeding How should I use this medicine? This drug is given as an infusion into a vein. It is administered in a hospital or clinic by a  specially trained health care professional. Talk to your pediatrician regarding the use of this medicine in children. Special care may be needed. Overdosage: If you think you have taken too much of this medicine contact a poison control center or emergency room at once. NOTE: This medicine is only for you. Do not share this medicine with others. What if I miss a dose? It is important not to miss your dose. Call your doctor or health care professional if you are unable to keep an appointment. What may interact with this medicine? Do not take this medicine with any of the following medications: cobicistat itraconazole This medicine may interact with the following medications: antiviral medicines for HIV or AIDS certain antibiotics like rifampin or rifabutin certain medicines for fungal infections like ketoconazole, posaconazole, and voriconazole certain medicines for seizures like carbamazepine, phenobarbital, phenotoin clarithromycin gemfibrozil nefazodone [facility]. John's Wort This list may not describe all possible interactions. Give your health care provider a list of all the medicines, herbs, non-prescription drugs, or dietary supplements you use. Also tell them if you smoke, drink alcohol, or use illegal drugs. Some items may interact with your medicine. What should I watch for while using this medicine? Your condition will be monitored carefully while you are receiving this medicine. You will need important blood work done while you are taking this medicine. This drug may make you feel generally unwell. This is not uncommon, as chemotherapy can affect healthy cells as well as cancer cells. Report any side effects. Continue your course of treatment even though you feel ill unless your doctor tells you to stop. In some cases, you may be given additional medicines to help with side effects. Follow all directions for their use. You may get drowsy or dizzy. Do not drive, use machinery, or do  anything that needs mental alertness until you know how this medicine affects you. Do not stand or sit up quickly, especially if you are an older patient. This reduces the risk of dizzy or fainting spells. Call your health care professional for advice if you get a fever, chills, or sore throat, or other symptoms of a cold or flu. Do not treat yourself. This medicine decreases your body's ability to fight infections. Try to avoid being around people who are sick. Avoid taking products that contain aspirin, acetaminophen, ibuprofen, naproxen, or ketoprofen unless instructed by your doctor. These medicines may hide a fever. This medicine may increase your risk to bruise or bleed. Call your doctor or health care professional if you notice any unusual bleeding. Be careful brushing and flossing your teeth or using a toothpick because you may get an infection or bleed more easily. If you have any dental work done, tell your dentist you are receiving this medicine. Do not become pregnant while taking this medicine or for 6 months after  stopping it. Women should inform their health care professional if they wish to become pregnant or think they might be pregnant. Men should not father a child while taking this medicine and for 3 months after stopping it. There is potential for serious side effects to an unborn child. Talk to your health care professional for more information. Do not breast-feed an infant while taking this medicine or for 7 days after stopping it. This medicine has caused ovarian failure in some women. This medicine may make it more difficult to get pregnant. Talk to your health care professional if you are concerned about your fertility. This medicine has caused decreased sperm counts in some men. This may make it more difficult to father a child. Talk to your health care professional if you are concerned about your fertility. What side effects may I notice from receiving this medicine? Side  effects that you should report to your doctor or health care professional as soon as possible: allergic reactions like skin rash, itching or hives, swelling of the face, lips, or tongue chest pain diarrhea flushing, runny nose, sweating during infusion low blood counts - this medicine may decrease the number of white blood cells, red blood cells and platelets. You may be at increased risk for infections and bleeding. nausea, vomiting pain, swelling, warmth in the leg signs of decreased platelets or bleeding - bruising, pinpoint red spots on the skin, black, tarry stools, blood in the urine signs of infection - fever or chills, cough, sore throat, pain or difficulty passing urine signs of decreased red blood cells - unusually weak or tired, fainting spells, lightheadedness Side effects that usually do not require medical attention (report to your doctor or health care professional if they continue or are bothersome): constipation hair loss headache loss of appetite mouth sores stomach pain This list may not describe all possible side effects. Call your doctor for medical advice about side effects. You may report side effects to FDA at 1-800-FDA-1088. Where should I keep my medicine? This drug is given in a hospital or clinic and will not be stored at home. NOTE: This sheet is a summary. It may not cover all possible information. If you have questions about this medicine, talk to your doctor, pharmacist, or health care provider.  2021 Elsevier/Gold Standard (2018-12-14 17:46:13)  Leucovorin injection What is this medicine? LEUCOVORIN (loo koe VOR in) is used to prevent or treat the harmful effects of some medicines. This medicine is used to treat anemia caused by a low amount of folic acid in the body. It is also used with 5-fluorouracil (5-FU) to treat colon cancer. This medicine may be used for other purposes; ask your health care provider or pharmacist if you have questions. What should  I tell my health care provider before I take this medicine? They need to know if you have any of these conditions: anemia from low levels of vitamin B-12 in the blood an unusual or allergic reaction to leucovorin, folic acid, other medicines, foods, dyes, or preservatives pregnant or trying to get pregnant breast-feeding How should I use this medicine? This medicine is for injection into a muscle or into a vein. It is given by a health care professional in a hospital or clinic setting. Talk to your pediatrician regarding the use of this medicine in children. Special care may be needed. Overdosage: If you think you have taken too much of this medicine contact a poison control center or emergency room at once. NOTE: This medicine is  only for you. Do not share this medicine with others. What if I miss a dose? This does not apply. What may interact with this medicine? capecitabine fluorouracil phenobarbital phenytoin primidone trimethoprim-sulfamethoxazole This list may not describe all possible interactions. Give your health care provider a list of all the medicines, herbs, non-prescription drugs, or dietary supplements you use. Also tell them if you smoke, drink alcohol, or use illegal drugs. Some items may interact with your medicine. What should I watch for while using this medicine? Your condition will be monitored carefully while you are receiving this medicine. This medicine may increase the side effects of 5-fluorouracil, 5-FU. Tell your doctor or health care professional if you have diarrhea or mouth sores that do not get better or that get worse. What side effects may I notice from receiving this medicine? Side effects that you should report to your doctor or health care professional as soon as possible: allergic reactions like skin rash, itching or hives, swelling of the face, lips, or tongue breathing problems fever, infection mouth sores unusual bleeding or bruising unusually  weak or tired Side effects that usually do not require medical attention (report to your doctor or health care professional if they continue or are bothersome): constipation or diarrhea loss of appetite nausea, vomiting This list may not describe all possible side effects. Call your doctor for medical advice about side effects. You may report side effects to FDA at 1-800-FDA-1088. Where should I keep my medicine? This drug is given in a hospital or clinic and will not be stored at home. NOTE: This sheet is a summary. It may not cover all possible information. If you have questions about this medicine, talk to your doctor, pharmacist, or health care provider.  2021 Elsevier/Gold Standard (2007-07-20 16:50:29)  Fluorouracil, 5-FU injection What is this medicine? FLUOROURACIL, 5-FU (flure oh YOOR a sil) is a chemotherapy drug. It slows the growth of cancer cells. This medicine is used to treat many types of cancer like breast cancer, colon or rectal cancer, pancreatic cancer, and stomach cancer. This medicine may be used for other purposes; ask your health care provider or pharmacist if you have questions. COMMON BRAND NAME(S): Adrucil What should I tell my health care provider before I take this medicine? They need to know if you have any of these conditions: blood disorders dihydropyrimidine dehydrogenase (DPD) deficiency infection (especially a virus infection such as chickenpox, cold sores, or herpes) kidney disease liver disease malnourished, poor nutrition recent or ongoing radiation therapy an unusual or allergic reaction to fluorouracil, other chemotherapy, other medicines, foods, dyes, or preservatives pregnant or trying to get pregnant breast-feeding How should I use this medicine? This drug is given as an infusion or injection into a vein. It is administered in a hospital or clinic by a specially trained health care professional. Talk to your pediatrician regarding the use of  this medicine in children. Special care may be needed. Overdosage: If you think you have taken too much of this medicine contact a poison control center or emergency room at once. NOTE: This medicine is only for you. Do not share this medicine with others. What if I miss a dose? It is important not to miss your dose. Call your doctor or health care professional if you are unable to keep an appointment. What may interact with this medicine? Do not take this medicine with any of the following medications: live virus vaccines This medicine may also interact with the following medications: medicines that treat or  prevent blood clots like warfarin, enoxaparin, and dalteparin This list may not describe all possible interactions. Give your health care provider a list of all the medicines, herbs, non-prescription drugs, or dietary supplements you use. Also tell them if you smoke, drink alcohol, or use illegal drugs. Some items may interact with your medicine. What should I watch for while using this medicine? Visit your doctor for checks on your progress. This drug may make you feel generally unwell. This is not uncommon, as chemotherapy can affect healthy cells as well as cancer cells. Report any side effects. Continue your course of treatment even though you feel ill unless your doctor tells you to stop. In some cases, you may be given additional medicines to help with side effects. Follow all directions for their use. Call your doctor or health care professional for advice if you get a fever, chills or sore throat, or other symptoms of a cold or flu. Do not treat yourself. This drug decreases your body's ability to fight infections. Try to avoid being around people who are sick. This medicine may increase your risk to bruise or bleed. Call your doctor or health care professional if you notice any unusual bleeding. Be careful brushing and flossing your teeth or using a toothpick because you may get an  infection or bleed more easily. If you have any dental work done, tell your dentist you are receiving this medicine. Avoid taking products that contain aspirin, acetaminophen, ibuprofen, naproxen, or ketoprofen unless instructed by your doctor. These medicines may hide a fever. Do not become pregnant while taking this medicine. Women should inform their doctor if they wish to become pregnant or think they might be pregnant. There is a potential for serious side effects to an unborn child. Talk to your health care professional or pharmacist for more information. Do not breast-feed an infant while taking this medicine. Men should inform their doctor if they wish to father a child. This medicine may lower sperm counts. Do not treat diarrhea with over the counter products. Contact your doctor if you have diarrhea that lasts more than 2 days or if it is severe and watery. This medicine can make you more sensitive to the sun. Keep out of the sun. If you cannot avoid being in the sun, wear protective clothing and use sunscreen. Do not use sun lamps or tanning beds/booths. What side effects may I notice from receiving this medicine? Side effects that you should report to your doctor or health care professional as soon as possible: allergic reactions like skin rash, itching or hives, swelling of the face, lips, or tongue low blood counts - this medicine may decrease the number of white blood cells, red blood cells and platelets. You may be at increased risk for infections and bleeding. signs of infection - fever or chills, cough, sore throat, pain or difficulty passing urine signs of decreased platelets or bleeding - bruising, pinpoint red spots on the skin, black, tarry stools, blood in the urine signs of decreased red blood cells - unusually weak or tired, fainting spells, lightheadedness breathing problems changes in vision chest pain mouth sores nausea and vomiting pain, swelling, redness at site where  injected pain, tingling, numbness in the hands or feet redness, swelling, or sores on hands or feet stomach pain unusual bleeding Side effects that usually do not require medical attention (report to your doctor or health care professional if they continue or are bothersome): changes in finger or toe nails diarrhea dry or itchy  skin hair loss headache loss of appetite sensitivity of eyes to the light stomach upset unusually teary eyes This list may not describe all possible side effects. Call your doctor for medical advice about side effects. You may report side effects to FDA at 1-800-FDA-1088. Where should I keep my medicine? This drug is given in a hospital or clinic and will not be stored at home. NOTE: This sheet is a summary. It may not cover all possible information. If you have questions about this medicine, talk to your doctor, pharmacist, or health care provider.  2021 Elsevier/Gold Standard (2018-12-14 15:00:03)

## 2020-07-12 NOTE — Progress Notes (Signed)
[facility] OFFICE PROGRESS NOTE   Diagnosis: Pancreas cancer  INTERVAL HISTORY:   Nathan Mora returns as scheduled.  He completed cycle 4 FOLFIRINOX 06/27/2020.  Oxaliplatin was further dose reduced due to thrombocytopenia.  He denies nausea/vomiting.  No mouth sores.  No diarrhea.  No significant cold sensitivity.  No numbness or tingling in the absence of cold exposure.  He denies fever and cough.  No bleeding.  Objective:  Vital signs in last 24 hours:  Blood pressure 121/83, pulse 73, temperature 97.8 F (36.6 C), temperature source Oral, resp. rate 19, height 5\' 8"  (1.727 m), weight 174 lb (78.9 kg), SpO2 99 %.    HEENT: No thrush or ulcers. Resp: Lungs clear bilaterally. Cardio: Regular rate and rhythm. GI: Abdomen soft and nontender.  No hepatomegaly. Vascular: No leg edema.  Calves soft and nontender. Neuro: Vibratory sense mildly decreased over the fingertips per tuning fork exam. Skin: Palms without erythema. Port-A-Cath without erythema.   Lab Results:  Lab Results  Component Value Date   WBC 5.5 06/26/2020   HGB 14.1 06/26/2020   HCT 42.3 06/26/2020   MCV 91.0 06/26/2020   PLT 89 (L) 06/26/2020   NEUTROABS 1.6 (L) 06/26/2020    Imaging:  No results found.  Medications: I have reviewed the patient's current medications.  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  Fat planes around the celiac axis preserved.  Less than 90 degree contact of the lateral wall of the SMA, no evidence of metastatic disease, 3 mm left lower lobe pulmonary nodule Cycle 1 FOLFOX 05/09/2020 (plan to add irinotecan with cycle 2 pending tolerance) Cycle 2 FOLFIRINOX 05/23/2020 Cycle 3 FOLFIRINOX 06/06/2020, oxaliplatin dose reduced secondary to thrombocytopenia Cycle 4 FOLFIRINOX 06/27/2020, oxaliplatin further dose reduced secondary to thrombocytopenia CTs at [facility] 07/03/2020-redemonstration of a heterogeneous pancreatic head mass with borderline encasement of the SMA as well as abutment of the portal vein, SMV and likely the IVC.  Similar mildly enlarged porta hepatis and pericaval lymph nodes.  No convincing evidence of distant metastatic disease. Cycle 5 FOLFIRINOX 07/12/2020 Obstructive jaundice secondary #1 ERCP 04/11/2020-prominent and floppy major papilla, dilated bile duct secondary to a distal stricture, placement of an uncovered metal stent, bile duct brushings-reactive glandular cells 3.   Diabetes   4.   Hyperlipidemia   5.   Glaucoma   6.   Port-A-Cath placement 05/04/2020 Interventional Radiology   7.   Mild thrombocytopenia (121,000) 05/09/2020    Disposition: Nathan Mora appears stable.  He has completed 4 cycles of FOLFIRINOX.  Restaging CT[facility] at [facility] appear stable.  Dr. Benay Spice recommends continuation of FOLFIRINOX, cycle 5 today.  CBC from today reviewed.  Labs adequate to proceed.  He has moderate thrombocytopenia.  He has been treated at a similar platelet count in the past.  He understands to contact the office with bleeding.  He will return for lab, follow-up, FOLFIRINOX in 2 weeks.  He will contact the office in the interim as outlined above or  with any other problems.  Patient seen with Dr. Benay Spice by video.    Nathan Mora ANP/GNP-BC   07/12/2020  8:53 AM This was a shared visit with [name].  Nathan Mora appears stable.  He has completed 4  cycles of FOLFIRINOX and has tolerated the chemotherapy well.  We reviewed the restaging CT results from [facility].  There is no evidence of disease progression.  He continues to have evidence of potential vascular involvement.  The plan is to continue FOLFIRINOX chemotherapy.  We will follow-up on the CA 19-9 and plan for another restaging CT after cycle 8.  He has thrombocytopenia.  We will consider further dose reduction of the oxaliplatin or changing to a 3-week chemotherapy interval if he develops progressive thrombocytopenia.  I was present for greater than 50% of today's visit.  I performed medical decision making.  [name], MD

## 2020-07-12 NOTE — Progress Notes (Signed)
Per [name], NP ok to treat with Platelets 76 and ok to start premeds with pending ANC.

## 2020-07-12 NOTE — Patient Instructions (Signed)
Implanted Port Home Guide An implanted port is a device that is placed under the skin. It is usually placed in the chest. The device can be used to give IV medicine, to take blood, or for dialysis. You may have an implanted port if: You need IV medicine that would be irritating to the small veins in your hands or arms. You need IV medicines, such as antibiotics, for a long period of time. You need IV nutrition for a long period of time. You need dialysis. When you have a port, your health care provider can choose to use the port instead of veins in your arms for these procedures. You may have fewer limitations when using a port than you would if you used other types of long-term IVs, and you will likely be able to return to normal activities afteryour incision heals. An implanted port has two main parts: Reservoir. The reservoir is the part where a needle is inserted to give medicines or draw blood. The reservoir is round. After it is placed, it appears as a small, raised area under your skin. Catheter. The catheter is a thin, flexible tube that connects the reservoir to a vein. Medicine that is inserted into the reservoir goes into the catheter and then into the vein. How is my port accessed? To access your port: A numbing cream may be placed on the skin over the port site. Your health care provider will put on a mask and sterile gloves. The skin over your port will be cleaned carefully with a germ-killing soap and allowed to dry. Your health care provider will gently pinch the port and insert a needle into it. Your health care provider will check for a blood return to make sure the port is in the vein and is not clogged. If your port needs to remain accessed to get medicine continuously (constant infusion), your health care provider will place a clear bandage (dressing) over the needle site. The dressing and needle will need to be changed every week, or as told by your health care provider. What  is flushing? Flushing helps keep the port from getting clogged. Follow instructions from your health care provider about how and when to flush the port. Ports are usually flushed with saline solution or a medicine called heparin. The need for flushing will depend on how the port is used: If the port is only used from time to time to give medicines or draw blood, the port may need to be flushed: Before and after medicines have been given. Before and after blood has been drawn. As part of routine maintenance. Flushing may be recommended every 4-6 weeks. If a constant infusion is running, the port may not need to be flushed. Throw away any syringes in a disposal container that is meant for sharp items (sharps container). You can buy a sharps container from a pharmacy, or you can make one by using an empty hard plastic bottle with a cover. How long will my port stay implanted? The port can stay in for as long as your health care provider thinks it is needed. When it is time for the port to come out, a surgery will be done to remove it. The surgery will be similar to the procedure that was done to putthe port in. Follow these instructions at home:  Flush your port as told by your health care provider. If you need an infusion over several days, follow instructions from your health care provider about how to take   care of your port site. Make sure you: Wash your hands with soap and water before you change your dressing. If soap and water are not available, use alcohol-based hand sanitizer. Change your dressing as told by your health care provider. Place any used dressings or infusion bags into a plastic bag. Throw that bag in the trash. Keep the dressing that covers the needle clean and dry. Do not get it wet. Do not use scissors or sharp objects near the tube. Keep the tube clamped, unless it is being used. Check your port site every day for signs of infection. Check for: Redness, swelling, or  pain. Fluid or blood. Pus or a bad smell. Protect the skin around the port site. Avoid wearing bra straps that rub or irritate the site. Protect the skin around your port from seat belts. Place a soft pad over your chest if needed. Bathe or shower as told by your health care provider. The site may get wet as long as you are not actively receiving an infusion. Return to your normal activities as told by your health care provider. Ask your health care provider what activities are safe for you. Carry a medical alert card or wear a medical alert bracelet at all times. This will let health care providers know that you have an implanted port in case of an emergency. Get help right away if: You have redness, swelling, or pain at the port site. You have fluid or blood coming from your port site. You have pus or a bad smell coming from the port site. You have a fever. Summary Implanted ports are usually placed in the chest for long-term IV access. Follow instructions from your health care provider about flushing the port and changing bandages (dressings). Take care of the area around your port by avoiding clothing that puts pressure on the area, and by watching for signs of infection. Protect the skin around your port from seat belts. Place a soft pad over your chest if needed. Get help right away if you have a fever or you have redness, swelling, pain, drainage, or a bad smell at the port site. This information is not intended to replace advice given to you by your health care provider. Make sure you discuss any questions you have with your healthcare provider. Document Revised: 05/30/2019 Document Reviewed: 05/30/2019 Elsevier Patient Education  2022 Elsevier Inc.  

## 2020-07-12 NOTE — Progress Notes (Signed)
Mrs. Nathan Mora would like to administer Udenyca at home with next chemotherapy treatment. Script sent to [facility] pharmacy to begin process.

## 2020-07-13 ENCOUNTER — Other Ambulatory Visit: Payer: Self-pay | Admitting: Family Medicine

## 2020-07-13 LAB — CANCER ANTIGEN 19-9: CA 19-9: 231 U/mL — ABNORMAL HIGH (ref 0–35)

## 2020-07-14 ENCOUNTER — Other Ambulatory Visit: Payer: Self-pay

## 2020-07-14 ENCOUNTER — Inpatient Hospital Stay: Payer: Medicare HMO

## 2020-07-14 VITALS — BP 113/76 | HR 67 | Temp 97.9°F | Resp 18

## 2020-07-14 DIAGNOSIS — K831 Obstruction of bile duct: Secondary | ICD-10-CM | POA: Diagnosis not present

## 2020-07-14 DIAGNOSIS — H409 Unspecified glaucoma: Secondary | ICD-10-CM | POA: Diagnosis not present

## 2020-07-14 DIAGNOSIS — C250 Malignant neoplasm of head of pancreas: Secondary | ICD-10-CM

## 2020-07-14 DIAGNOSIS — E785 Hyperlipidemia, unspecified: Secondary | ICD-10-CM | POA: Diagnosis not present

## 2020-07-14 DIAGNOSIS — E119 Type 2 diabetes mellitus without complications: Secondary | ICD-10-CM | POA: Diagnosis not present

## 2020-07-14 DIAGNOSIS — D696 Thrombocytopenia, unspecified: Secondary | ICD-10-CM | POA: Diagnosis not present

## 2020-07-14 DIAGNOSIS — Z794 Long term (current) use of insulin: Secondary | ICD-10-CM | POA: Diagnosis not present

## 2020-07-14 DIAGNOSIS — Z5111 Encounter for antineoplastic chemotherapy: Secondary | ICD-10-CM | POA: Diagnosis not present

## 2020-07-14 MED ORDER — HEPARIN SOD (PORK) LOCK FLUSH 100 UNIT/ML IV SOLN
500.0000 [IU] | Freq: Once | INTRAVENOUS | Status: AC | PRN
  Administered 2020-07-14: 500 [IU]
  Filled 2020-07-14: qty 5

## 2020-07-14 MED ORDER — SODIUM CHLORIDE 0.9% FLUSH
10.0000 mL | INTRAVENOUS | Status: DC | PRN
  Administered 2020-07-14: 10 mL
  Filled 2020-07-14: qty 10

## 2020-07-14 MED ORDER — PEGFILGRASTIM-CBQV 6 MG/0.6ML ~~LOC~~ SOSY
6.0000 mg | PREFILLED_SYRINGE | Freq: Once | SUBCUTANEOUS | Status: AC
  Administered 2020-07-14: 6 mg via SUBCUTANEOUS

## 2020-07-16 ENCOUNTER — Encounter: Payer: Self-pay | Admitting: *Deleted

## 2020-07-16 NOTE — Progress Notes (Signed)
Medicare D plan has denied home use of Udenyca. Sent in completed appeal form and chart information to [phone] [facility]).

## 2020-07-17 ENCOUNTER — Telehealth: Payer: Self-pay | Admitting: *Deleted

## 2020-07-17 NOTE — Telephone Encounter (Signed)
Fax from [facility] shows home Nathan Mora has been approved from 01/28/20 to 01/26/2021. [place] and it did process w/copay of $750/month supply. Spoke with patient and he confirms this is cost prohibitive for him. Will continue to receive the injections a [place]. Notified Kristopher Oppenheim to cancel the prescription.

## 2020-07-20 ENCOUNTER — Telehealth: Payer: Self-pay | Admitting: *Deleted

## 2020-07-20 DIAGNOSIS — Z794 Long term (current) use of insulin: Secondary | ICD-10-CM | POA: Diagnosis not present

## 2020-07-20 DIAGNOSIS — E1165 Type 2 diabetes mellitus with hyperglycemia: Secondary | ICD-10-CM | POA: Diagnosis not present

## 2020-07-20 NOTE — Telephone Encounter (Signed)
Pt left message for return call.  Left message to call back but tried again late this pm & reached her.  She has some concerns about husband's behavior.  She reports that he is grumpy, not so nice for period of time after chemo.  She wanted to know if this could be from the steroids.  She also wondered about pegfilgrastim shot & if it was too early to get right after taking pump off.  Informed that this was OK & normal procedure.  As for as grumpiness, not sure if could be related to steroids.  She will discuss with [name] NP next week.

## 2020-07-21 ENCOUNTER — Other Ambulatory Visit: Payer: Self-pay | Admitting: Gastroenterology

## 2020-07-22 ENCOUNTER — Other Ambulatory Visit: Payer: Self-pay | Admitting: Oncology

## 2020-07-23 ENCOUNTER — Other Ambulatory Visit: Payer: Self-pay | Admitting: Oncology

## 2020-07-23 DIAGNOSIS — C259 Malignant neoplasm of pancreas, unspecified: Secondary | ICD-10-CM

## 2020-07-26 ENCOUNTER — Other Ambulatory Visit: Payer: Self-pay

## 2020-07-26 ENCOUNTER — Inpatient Hospital Stay: Payer: Medicare HMO

## 2020-07-26 ENCOUNTER — Inpatient Hospital Stay (HOSPITAL_BASED_OUTPATIENT_CLINIC_OR_DEPARTMENT_OTHER): Payer: Medicare HMO | Admitting: Oncology

## 2020-07-26 VITALS — BP 109/72 | HR 64 | Temp 98.2°F | Resp 18 | Ht 68.0 in | Wt 172.6 lb

## 2020-07-26 DIAGNOSIS — C250 Malignant neoplasm of head of pancreas: Secondary | ICD-10-CM | POA: Diagnosis not present

## 2020-07-26 DIAGNOSIS — Z5111 Encounter for antineoplastic chemotherapy: Secondary | ICD-10-CM | POA: Diagnosis not present

## 2020-07-26 DIAGNOSIS — C259 Malignant neoplasm of pancreas, unspecified: Secondary | ICD-10-CM

## 2020-07-26 DIAGNOSIS — K831 Obstruction of bile duct: Secondary | ICD-10-CM | POA: Diagnosis not present

## 2020-07-26 DIAGNOSIS — E785 Hyperlipidemia, unspecified: Secondary | ICD-10-CM | POA: Diagnosis not present

## 2020-07-26 DIAGNOSIS — E119 Type 2 diabetes mellitus without complications: Secondary | ICD-10-CM | POA: Diagnosis not present

## 2020-07-26 DIAGNOSIS — Z794 Long term (current) use of insulin: Secondary | ICD-10-CM | POA: Diagnosis not present

## 2020-07-26 DIAGNOSIS — D696 Thrombocytopenia, unspecified: Secondary | ICD-10-CM | POA: Diagnosis not present

## 2020-07-26 DIAGNOSIS — H409 Unspecified glaucoma: Secondary | ICD-10-CM | POA: Diagnosis not present

## 2020-07-26 LAB — CBC WITH DIFFERENTIAL (CANCER CENTER ONLY)
Abs Immature Granulocytes: 0.74 10*3/uL — ABNORMAL HIGH (ref 0.00–0.07)
Basophils Absolute: 0.1 10*3/uL (ref 0.0–0.1)
Basophils Relative: 1 %
Eosinophils Absolute: 0 10*3/uL (ref 0.0–0.5)
Eosinophils Relative: 0 %
HCT: 39.4 % (ref 39.0–52.0)
Hemoglobin: 13 g/dL (ref 13.0–17.0)
Immature Granulocytes: 7 %
Lymphocytes Relative: 27 %
Lymphs Abs: 3.1 10*3/uL (ref 0.7–4.0)
MCH: 30 pg (ref 26.0–34.0)
MCHC: 33 g/dL (ref 30.0–36.0)
MCV: 90.8 fL (ref 80.0–100.0)
Monocytes Absolute: 1.4 10*3/uL — ABNORMAL HIGH (ref 0.1–1.0)
Monocytes Relative: 12 %
Neutro Abs: 6 10*3/uL (ref 1.7–7.7)
Neutrophils Relative %: 53 %
Platelet Count: 91 10*3/uL — ABNORMAL LOW (ref 150–400)
RBC: 4.34 MIL/uL (ref 4.22–5.81)
RDW: 15.3 % (ref 11.5–15.5)
WBC Count: 11.3 10*3/uL — ABNORMAL HIGH (ref 4.0–10.5)
nRBC: 0 % (ref 0.0–0.2)

## 2020-07-26 LAB — CMP (CANCER CENTER ONLY)
ALT: 82 U/L — ABNORMAL HIGH (ref 0–44)
AST: 34 U/L (ref 15–41)
Albumin: 3.9 g/dL (ref 3.5–5.0)
Alkaline Phosphatase: 223 U/L — ABNORMAL HIGH (ref 38–126)
Anion gap: 9 (ref 5–15)
BUN: 14 mg/dL (ref 8–23)
CO2: 25 mmol/L (ref 22–32)
Calcium: 8.9 mg/dL (ref 8.9–10.3)
Chloride: 106 mmol/L (ref 98–111)
Creatinine: 0.77 mg/dL (ref 0.61–1.24)
GFR, Estimated: >60 mL/min (ref >60)
Glucose, Bld: 150 mg/dL — ABNORMAL HIGH (ref 70–99)
Potassium: 3.9 mmol/L (ref 3.5–5.1)
Sodium: 140 mmol/L (ref 135–145)
Total Bilirubin: 0.5 mg/dL (ref 0.3–1.2)
Total Protein: 6.2 g/dL — ABNORMAL LOW (ref 6.5–8.1)

## 2020-07-26 MED ORDER — ATROPINE SULFATE 1 MG/ML IJ SOLN
0.5000 mg | Freq: Once | INTRAMUSCULAR | Status: AC | PRN
  Administered 2020-07-26: 0.5 mg via INTRAVENOUS
  Filled 2020-07-26: qty 1

## 2020-07-26 MED ORDER — SODIUM CHLORIDE 0.9 % IV SOLN
150.0000 mg | Freq: Once | INTRAVENOUS | Status: AC
  Administered 2020-07-26: 150 mg via INTRAVENOUS
  Filled 2020-07-26: qty 5

## 2020-07-26 MED ORDER — OXALIPLATIN CHEMO INJECTION 100 MG/20ML
50.0000 mg/m2 | Freq: Once | INTRAVENOUS | Status: AC
  Administered 2020-07-26: 95 mg via INTRAVENOUS
  Filled 2020-07-26: qty 19

## 2020-07-26 MED ORDER — SODIUM CHLORIDE 0.9 % IV SOLN
400.0000 mg/m2 | Freq: Once | INTRAVENOUS | Status: AC
  Administered 2020-07-26: 768 mg via INTRAVENOUS
  Filled 2020-07-26: qty 38.4

## 2020-07-26 MED ORDER — DEXTROSE 5 % IV SOLN
Freq: Once | INTRAVENOUS | Status: AC
  Filled 2020-07-26: qty 250

## 2020-07-26 MED ORDER — SODIUM CHLORIDE 0.9 % IV SOLN
10.0000 mg | Freq: Once | INTRAVENOUS | Status: AC
  Administered 2020-07-26: 10 mg via INTRAVENOUS
  Filled 2020-07-26: qty 1

## 2020-07-26 MED ORDER — SODIUM CHLORIDE 0.9 % IV SOLN
2000.0000 mg/m2 | INTRAVENOUS | Status: DC
  Administered 2020-07-26: 3850 mg via INTRAVENOUS
  Filled 2020-07-26: qty 77

## 2020-07-26 MED ORDER — SODIUM CHLORIDE 0.9 % IV SOLN
150.0000 mg/m2 | Freq: Once | INTRAVENOUS | Status: AC
  Administered 2020-07-26: 280 mg via INTRAVENOUS
  Filled 2020-07-26: qty 14

## 2020-07-26 MED ORDER — PALONOSETRON HCL INJECTION 0.25 MG/5ML
0.2500 mg | Freq: Once | INTRAVENOUS | Status: AC
  Administered 2020-07-26: 0.25 mg via INTRAVENOUS
  Filled 2020-07-26: qty 5

## 2020-07-26 NOTE — Patient Instructions (Signed)
Implanted Port Home Guide An implanted port is a device that is placed under the skin. It is usually placed in the chest. The device can be used to give IV medicine, to take blood, or for dialysis. You may have an implanted port if: You need IV medicine that would be irritating to the small veins in your hands or arms. You need IV medicines, such as antibiotics, for a long period of time. You need IV nutrition for a long period of time. You need dialysis. When you have a port, your health care provider can choose to use the port instead of veins in your arms for these procedures. You may have fewer limitations when using a port than you would if you used other types of long-term IVs, and you will likely be able to return to normal activities afteryour incision heals. An implanted port has two main parts: Reservoir. The reservoir is the part where a needle is inserted to give medicines or draw blood. The reservoir is round. After it is placed, it appears as a small, raised area under your skin. Catheter. The catheter is a thin, flexible tube that connects the reservoir to a vein. Medicine that is inserted into the reservoir goes into the catheter and then into the vein. How is my port accessed? To access your port: A numbing cream may be placed on the skin over the port site. Your health care provider will put on a mask and sterile gloves. The skin over your port will be cleaned carefully with a germ-killing soap and allowed to dry. Your health care provider will gently pinch the port and insert a needle into it. Your health care provider will check for a blood return to make sure the port is in the vein and is not clogged. If your port needs to remain accessed to get medicine continuously (constant infusion), your health care provider will place a clear bandage (dressing) over the needle site. The dressing and needle will need to be changed every week, or as told by your health care provider. What  is flushing? Flushing helps keep the port from getting clogged. Follow instructions from your health care provider about how and when to flush the port. Ports are usually flushed with saline solution or a medicine called heparin. The need for flushing will depend on how the port is used: If the port is only used from time to time to give medicines or draw blood, the port may need to be flushed: Before and after medicines have been given. Before and after blood has been drawn. As part of routine maintenance. Flushing may be recommended every 4-6 weeks. If a constant infusion is running, the port may not need to be flushed. Throw away any syringes in a disposal container that is meant for sharp items (sharps container). You can buy a sharps container from a pharmacy, or you can make one by using an empty hard plastic bottle with a cover. How long will my port stay implanted? The port can stay in for as long as your health care provider thinks it is needed. When it is time for the port to come out, a surgery will be done to remove it. The surgery will be similar to the procedure that was done to putthe port in. Follow these instructions at home:  Flush your port as told by your health care provider. If you need an infusion over several days, follow instructions from your health care provider about how to take   care of your port site. Make sure you: Wash your hands with soap and water before you change your dressing. If soap and water are not available, use alcohol-based hand sanitizer. Change your dressing as told by your health care provider. Place any used dressings or infusion bags into a plastic bag. Throw that bag in the trash. Keep the dressing that covers the needle clean and dry. Do not get it wet. Do not use scissors or sharp objects near the tube. Keep the tube clamped, unless it is being used. Check your port site every day for signs of infection. Check for: Redness, swelling, or  pain. Fluid or blood. Pus or a bad smell. Protect the skin around the port site. Avoid wearing bra straps that rub or irritate the site. Protect the skin around your port from seat belts. Place a soft pad over your chest if needed. Bathe or shower as told by your health care provider. The site may get wet as long as you are not actively receiving an infusion. Return to your normal activities as told by your health care provider. Ask your health care provider what activities are safe for you. Carry a medical alert card or wear a medical alert bracelet at all times. This will let health care providers know that you have an implanted port in case of an emergency. Get help right away if: You have redness, swelling, or pain at the port site. You have fluid or blood coming from your port site. You have pus or a bad smell coming from the port site. You have a fever. Summary Implanted ports are usually placed in the chest for long-term IV access. Follow instructions from your health care provider about flushing the port and changing bandages (dressings). Take care of the area around your port by avoiding clothing that puts pressure on the area, and by watching for signs of infection. Protect the skin around your port from seat belts. Place a soft pad over your chest if needed. Get help right away if you have a fever or you have redness, swelling, pain, drainage, or a bad smell at the port site. This information is not intended to replace advice given to you by your health care provider. Make sure you discuss any questions you have with your healthcare provider. Document Revised: 05/30/2019 Document Reviewed: 05/30/2019 Elsevier Patient Education  2022 Elsevier Inc.  

## 2020-07-26 NOTE — Progress Notes (Signed)
[facility] OFFICE PROGRESS NOTE   Diagnosis: Pancreas cancer  INTERVAL HISTORY:   Nathan Mora completed another cycle of FOLFIRINOX on 07/12/2020.  No nausea, mouth sores, diarrhea, or neuropathy symptoms.  He feels well.  He is playing golf.  He reports his blood sugar has generally been under good control.  The blood sugar increases immediately following chemotherapy.  Objective:  Vital signs in last 24 hours:  Blood pressure 109/72, pulse 64, temperature 98.2 F (36.8 C), temperature source Oral, resp. rate 18, height 5\' 8"  (1.727 m), weight 172 lb 9.6 oz (78.3 kg), SpO2 100 %.    HEENT: No thrush or ulcers Resp: Lungs clear bilaterally Cardio: Regular rate and rhythm GI: No mass, no hepatosplenomegaly, nontender Vascular: No leg edema Neuro: Mild loss of vibratory sense at the fingertips bilaterally Skin: Palms without erythema  Portacath/PICC-without erythema  Lab Results:  Lab Results  Component Value Date   WBC 11.3 (H) 07/26/2020   HGB 13.0 07/26/2020   HCT 39.4 07/26/2020   MCV 90.8 07/26/2020   PLT 91 (L) 07/26/2020   NEUTROABS PENDING 07/26/2020    CMP  Lab Results  Component Value Date   NA 139 07/12/2020   K 4.2 07/12/2020   CL 105 07/12/2020   CO2 27 07/12/2020   GLUCOSE 127 (H) 07/12/2020   BUN 16 07/12/2020   CREATININE 0.71 07/12/2020   CALCIUM 8.8 (L) 07/12/2020   PROT 6.0 (L) 07/12/2020   ALBUMIN 3.9 07/12/2020   AST 25 07/12/2020   ALT 30 07/12/2020   ALKPHOS 125 07/12/2020   BILITOT 0.4 07/12/2020   GFRNONAA >60 07/12/2020   GFRAA 99 12/21/2019     Medications: I have reviewed the patient's current medications.   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  Fat planes around the celiac axis preserved.  Less than 90 degree contact of the lateral wall of the SMA, no evidence of metastatic disease, 3 mm left lower lobe pulmonary nodule Cycle 1 FOLFOX 05/09/2020 (plan to add irinotecan with cycle 2 pending tolerance) Cycle 2 FOLFIRINOX 05/23/2020 Cycle 3 FOLFIRINOX 06/06/2020, oxaliplatin dose reduced secondary to thrombocytopenia Cycle 4 FOLFIRINOX 06/27/2020, oxaliplatin further dose reduced secondary to thrombocytopenia CTs at [facility] 07/03/2020-redemonstration of a heterogeneous pancreatic head mass with borderline encasement of the SMA as well as abutment of the portal vein, SMV and likely the IVC.  Similar mildly enlarged porta hepatis and pericaval lymph nodes.  No convincing evidence of distant metastatic disease. Cycle 5 FOLFIRINOX 07/12/2020 Cycle 6 FOLFIRINOX 07/26/2020 Obstructive jaundice secondary #1 ERCP 04/11/2020-prominent and floppy major papilla, dilated bile duct secondary to a distal stricture, placement of an uncovered metal stent, bile duct brushings-reactive glandular cells 3.   Diabetes   4.   Hyperlipidemia   5.   Glaucoma   6.   Port-A-Cath placement 05/04/2020 Interventional Radiology   7.   Mild thrombocytopenia (121,000) 05/09/2020      Disposition: Nathan Haven appears stable.  He continues to tolerate the chemotherapy well.  He will complete another cycle of FOLFIRINOX today.  He will return for an office visit and chemotherapy in 2 weeks.  The plan is to complete a total of 8 cycles of FOLFIRINOX prior to surgery.  [name], MD  07/26/2020  9:46 AM

## 2020-07-27 ENCOUNTER — Encounter: Payer: Self-pay | Admitting: Oncology

## 2020-07-27 ENCOUNTER — Encounter: Payer: Self-pay | Admitting: Nurse Practitioner

## 2020-07-27 DIAGNOSIS — C250 Malignant neoplasm of head of pancreas: Secondary | ICD-10-CM | POA: Diagnosis not present

## 2020-07-27 NOTE — Patient Instructions (Signed)
[place]   Discharge Instructions: Thank you for choosing [facility] to provide your oncology and hematology care.   If you have a lab appointment with the [facility], please go directly to the [facility] and check in at the registration area.   Wear comfortable clothing and clothing appropriate for easy access to any Portacath or PICC line.   We strive to give you quality time with your provider. You may need to reschedule your appointment if you arrive late (15 or more minutes).  Arriving late affects you and other patients whose appointments are after yours.  Also, if you miss three or more appointments without notifying the office, you may be dismissed from the clinic at the provider's discretion.      For prescription refill requests, have your pharmacy contact our office and allow 72 hours for refills to be completed.    Today you received the following chemotherapy and/or immunotherapy agents Oxaliplatin (ELOXATIN), Irinotecan (CAMPTOSAR), Leucovorin & Flourouracil (ADRUCIL).      To help prevent nausea and vomiting after your treatment, we encourage you to take your nausea medication as directed.  BELOW ARE SYMPTOMS THAT SHOULD BE REPORTED IMMEDIATELY: *FEVER GREATER THAN 100.4 F (38 C) OR HIGHER *CHILLS OR SWEATING *NAUSEA AND VOMITING THAT IS NOT CONTROLLED WITH YOUR NAUSEA MEDICATION *UNUSUAL SHORTNESS OF BREATH *UNUSUAL BRUISING OR BLEEDING *URINARY PROBLEMS (pain or burning when urinating, or frequent urination) *BOWEL PROBLEMS (unusual diarrhea, constipation, pain near the anus) TENDERNESS IN MOUTH AND THROAT WITH OR WITHOUT PRESENCE OF ULCERS (sore throat, sores in mouth, or a toothache) UNUSUAL RASH, SWELLING OR PAIN  UNUSUAL VAGINAL DISCHARGE OR ITCHING   Items with * indicate a potential emergency and should be followed up as soon as possible or go to the Emergency Department if any problems should occur.  Please  show the CHEMOTHERAPY ALERT CARD or IMMUNOTHERAPY ALERT CARD at check-in to the Emergency Department and triage nurse.  Should you have questions after your visit or need to cancel or reschedule your appointment, please contact [facility]  Dept: [phone]  and follow the prompts.  Office hours are 8:00 a.m. to 4:30 p.m. Monday - Friday. Please note that voicemails left after 4:00 p.m. may not be returned until the following business day.  We are closed weekends and major holidays. You have access to a nurse at all times for urgent questions. Please call the main number to the clinic Dept: [phone] and follow the prompts.   For any non-urgent questions, you may also contact your provider using MyChart. We now offer e-Visits for anyone 108 and older to request care online for non-urgent symptoms. For details visit [email or web address].   Also download the MyChart app! Go to the app store, search "MyChart", open the app, select [facility], and log in with your MyChart username and password.  Due to Covid, a mask is required upon entering the hospital/clinic. If you do not have a mask, one will be given to you upon arrival. For doctor visits, patients may have 1 support person aged 16 or older with them. For treatment visits, patients cannot have anyone with them due to current Covid guidelines and our immunocompromised population.   Oxaliplatin Injection What is this medication? OXALIPLATIN (ox AL i PLA tin) is a chemotherapy drug. It targets fast dividing cells, like cancer cells, and causes these cells to die. This medicine is usedto treat cancers of the colon and rectum,  and many other cancers. This medicine may be used for other purposes; ask your health care provider orpharmacist if you have questions. COMMON BRAND NAME(S): Eloxatin What should I tell my care team before I take this medication? They need to know if you have any of these conditions: heart  disease history of irregular heartbeat liver disease low blood counts, like white cells, platelets, or red blood cells lung or breathing disease, like asthma take medicines that treat or prevent blood clots tingling of the fingers or toes, or other nerve disorder an unusual or allergic reaction to oxaliplatin, other chemotherapy, other medicines, foods, dyes, or preservatives pregnant or trying to get pregnant breast-feeding How should I use this medication? This drug is given as an infusion into a vein. It is administered in a hospitalor clinic by a specially trained health care professional. Talk to your pediatrician regarding the use of this medicine in children.Special care may be needed. Overdosage: If you think you have taken too much of this medicine contact apoison control center or emergency room at once. NOTE: This medicine is only for you. Do not share this medicine with others. What if I miss a dose? It is important not to miss a dose. Call your doctor or health careprofessional if you are unable to keep an appointment. What may interact with this medication? Do not take this medicine with any of the following medications: cisapride dronedarone pimozide thioridazine This medicine may also interact with the following medications: aspirin and aspirin-like medicines certain medicines that treat or prevent blood clots like warfarin, apixaban, dabigatran, and rivaroxaban cisplatin cyclosporine diuretics medicines for infection like acyclovir, adefovir, amphotericin B, bacitracin, cidofovir, foscarnet, ganciclovir, gentamicin, pentamidine, vancomycin NSAIDs, medicines for pain and inflammation, like ibuprofen or naproxen other medicines that prolong the QT interval (an abnormal heart rhythm) pamidronate zoledronic acid This list may not describe all possible interactions. Give your health care provider a list of all the medicines, herbs, non-prescription drugs, or dietary  supplements you use. Also tell them if you smoke, drink alcohol, or use illegaldrugs. Some items may interact with your medicine. What should I watch for while using this medication? Your condition will be monitored carefully while you are receiving thismedicine. You may need blood work done while you are taking this medicine. This medicine may make you feel generally unwell. This is not uncommon as chemotherapy can affect healthy cells as well as cancer cells. Report any side effects. Continue your course of treatment even though you feel ill unless yourhealthcare professional tells you to stop. This medicine can make you more sensitive to cold. Do not drink cold drinks or use ice. Cover exposed skin before coming in contact with cold temperatures or cold objects. When out in cold weather wear warm clothing and cover your mouth and nose to warm the air that goes into your lungs. Tell your doctor if you getsensitive to the cold. Do not become pregnant while taking this medicine or for 9 months after stopping it. Women should inform their health care professional if they wish to become pregnant or think they might be pregnant. Men should not father a child while taking this medicine and for 6 months after stopping it. There is potential for serious side effects to an unborn child. Talk to your health careprofessional for more information. Do not breast-feed a child while taking this medicine or for 3 months afterstopping it. This medicine has caused ovarian failure in some women. This medicine may make it more difficult to  get pregnant. Talk to your health care professional if Ventura Sellers concerned about your fertility. This medicine has caused decreased sperm counts in some men. This may make it more difficult to father a child. Talk to your health care professional if Ventura Sellers concerned about your fertility. This medicine may increase your risk of getting an infection. Call your health care professional for  advice if you get a fever, chills, or sore throat, or other symptoms of a cold or flu. Do not treat yourself. Try to avoid beingaround people who are sick. Avoid taking medicines that contain aspirin, acetaminophen, ibuprofen, naproxen, or ketoprofen unless instructed by your health care professional.These medicines may hide a fever. Be careful brushing or flossing your teeth or using a toothpick because you may get an infection or bleed more easily. If you have any dental work done, Primary school teacher you are receiving this medicine. What side effects may I notice from receiving this medication? Side effects that you should report to your doctor or health care professionalas soon as possible: allergic reactions like skin rash, itching or hives, swelling of the face, lips, or tongue breathing problems cough low blood counts - this medicine may decrease the number of white blood cells, red blood cells, and platelets. You may be at increased risk for infections and bleeding nausea, vomiting pain, redness, or irritation at site where injected pain, tingling, numbness in the hands or feet signs and symptoms of bleeding such as bloody or black, tarry stools; red or dark brown urine; spitting up blood or brown material that looks like coffee grounds; red spots on the skin; unusual bruising or bleeding from the eyes, gums, or nose signs and symptoms of a dangerous change in heartbeat or heart rhythm like chest pain; dizziness; fast, irregular heartbeat; palpitations; feeling faint or lightheaded; falls signs and symptoms of infection like fever; chills; cough; sore throat; pain or trouble passing urine signs and symptoms of liver injury like dark yellow or brown urine; general ill feeling or flu-like symptoms; light-colored stools; loss of appetite; nausea; right upper belly pain; unusually weak or tired; yellowing of the eyes or skin signs and symptoms of low red blood cells or anemia such as unusually weak  or tired; feeling faint or lightheaded; falls signs and symptoms of muscle injury like dark urine; trouble passing urine or change in the amount of urine; unusually weak or tired; muscle pain; back pain Side effects that usually do not require medical attention (report to yourdoctor or health care professional if they continue or are bothersome): changes in taste diarrhea gas hair loss loss of appetite mouth sores This list may not describe all possible side effects. Call your doctor for medical advice about side effects. You may report side effects to FDA at1-800-FDA-1088. Where should I keep my medication? This drug is given in a hospital or clinic and will not be stored at home. NOTE: This sheet is a summary. It may not cover all possible information. If you have questions about this medicine, talk to your doctor, pharmacist, orhealth care provider.  2022 Elsevier/Gold Standard (2018-06-02 12:20:35)  Irinotecan injection What is this medication? IRINOTECAN (ir in oh TEE kan ) is a chemotherapy drug. It is used to treatcolon and rectal cancer. This medicine may be used for other purposes; ask your health care provider orpharmacist if you have questions. COMMON BRAND NAME(S): Camptosar What should I tell my care team before I take this medication? They need to know if you have any of these conditions:  dehydration diarrhea infection (especially a virus infection such as chickenpox, cold sores, or herpes) liver disease low blood counts, like low white cell, platelet, or red cell counts low levels of calcium, magnesium, or potassium in the blood recent or ongoing radiation therapy an unusual or allergic reaction to irinotecan, other medicines, foods, dyes, or preservatives pregnant or trying to get pregnant breast-feeding How should I use this medication? This drug is given as an infusion into a vein. It is administered in a hospitalor clinic by a specially trained health care  professional. Talk to your pediatrician regarding the use of this medicine in children.Special care may be needed. Overdosage: If you think you have taken too much of this medicine contact apoison control center or emergency room at once. NOTE: This medicine is only for you. Do not share this medicine with others. What if I miss a dose? It is important not to miss your dose. Call your doctor or health careprofessional if you are unable to keep an appointment. What may interact with this medication? Do not take this medicine with any of the following medications: cobicistat itraconazole This medicine may interact with the following medications: antiviral medicines for HIV or AIDS certain antibiotics like rifampin or rifabutin certain medicines for fungal infections like ketoconazole, posaconazole, and voriconazole certain medicines for seizures like carbamazepine, phenobarbital, phenotoin clarithromycin gemfibrozil nefazodone [facility]. John's Wort This list may not describe all possible interactions. Give your health care provider a list of all the medicines, herbs, non-prescription drugs, or dietary supplements you use. Also tell them if you smoke, drink alcohol, or use illegaldrugs. Some items may interact with your medicine. What should I watch for while using this medication? Your condition will be monitored carefully while you are receiving this medicine. You will need important blood work done while you are taking thismedicine. This drug may make you feel generally unwell. This is not uncommon, as chemotherapy can affect healthy cells as well as cancer cells. Report any side effects. Continue your course of treatment even though you feel ill unless yourdoctor tells you to stop. In some cases, you may be given additional medicines to help with side effects.Follow all directions for their use. You may get drowsy or dizzy. Do not drive, use machinery, or do anything that needs mental alertness  until you know how this medicine affects you. Do not stand or sit up quickly, especially if you are an older patient. This reducesthe risk of dizzy or fainting spells. Call your health care professional for advice if you get a fever, chills, or sore throat, or other symptoms of a cold or flu. Do not treat yourself. This medicine decreases your body's ability to fight infections. Try to avoid beingaround people who are sick. Avoid taking products that contain aspirin, acetaminophen, ibuprofen, naproxen, or ketoprofen unless instructed by your doctor. These medicines may hide afever. This medicine may increase your risk to bruise or bleed. Call your doctor orhealth care professional if you notice any unusual bleeding. Be careful brushing and flossing your teeth or using a toothpick because you may get an infection or bleed more easily. If you have any dental work done,tell your dentist you are receiving this medicine. Do not become pregnant while taking this medicine or for 6 months after stopping it. Women should inform their health care professional if they wish to become pregnant or think they might be pregnant. Men should not father a child while taking this medicine and for 3 months after stopping it. There  is potential for serious side effects to an unborn child. Talk to your health careprofessional for more information. Do not breast-feed an infant while taking this medicine or for 7 days afterstopping it. This medicine has caused ovarian failure in some women. This medicine may make it more difficult to get pregnant. Talk to your health care professional if Ventura Sellers concerned about your fertility. This medicine has caused decreased sperm counts in some men. This may make it more difficult to father a child. Talk to your health care professional if Ventura Sellers concerned about your fertility. What side effects may I notice from receiving this medication? Side effects that you should report to your doctor or  health care professionalas soon as possible: allergic reactions like skin rash, itching or hives, swelling of the face, lips, or tongue chest pain diarrhea flushing, runny nose, sweating during infusion low blood counts - this medicine may decrease the number of white blood cells, red blood cells and platelets. You may be at increased risk for infections and bleeding. nausea, vomiting pain, swelling, warmth in the leg signs of decreased platelets or bleeding - bruising, pinpoint red spots on the skin, black, tarry stools, blood in the urine signs of infection - fever or chills, cough, sore throat, pain or difficulty passing urine signs of decreased red blood cells - unusually weak or tired, fainting spells, lightheadedness Side effects that usually do not require medical attention (report to yourdoctor or health care professional if they continue or are bothersome): constipation hair loss headache loss of appetite mouth sores stomach pain This list may not describe all possible side effects. Call your doctor for medical advice about side effects. You may report side effects to FDA at1-800-FDA-1088. Where should I keep my medication? This drug is given in a hospital or clinic and will not be stored at home. NOTE: This sheet is a summary. It may not cover all possible information. If you have questions about this medicine, talk to your doctor, pharmacist, orhealth care provider.  2022 Elsevier/Gold Standard (2018-12-14 17:46:13)  Leucovorin injection What is this medication? LEUCOVORIN (loo koe VOR in) is used to prevent or treat the harmful effects of some medicines. This medicine is used to treat anemia caused by a low amount of folic acid in the body. It is also used with 5-fluorouracil (5-FU) to treatcolon cancer. This medicine may be used for other purposes; ask your health care provider orpharmacist if you have questions. What should I tell my care team before I take this  medication? They need to know if you have any of these conditions: anemia from low levels of vitamin B-12 in the blood an unusual or allergic reaction to leucovorin, folic acid, other medicines, foods, dyes, or preservatives pregnant or trying to get pregnant breast-feeding How should I use this medication? This medicine is for injection into a muscle or into a vein. It is given by ahealth care professional in a hospital or clinic setting. Talk to your pediatrician regarding the use of this medicine in children.Special care may be needed. Overdosage: If you think you have taken too much of this medicine contact apoison control center or emergency room at once. NOTE: This medicine is only for you. Do not share this medicine with others. What if I miss a dose? This does not apply. What may interact with this medication? capecitabine fluorouracil phenobarbital phenytoin primidone trimethoprim-sulfamethoxazole This list may not describe all possible interactions. Give your health care provider a list of all the medicines, herbs, non-prescription  drugs, or dietary supplements you use. Also tell them if you smoke, drink alcohol, or use illegaldrugs. Some items may interact with your medicine. What should I watch for while using this medication? Your condition will be monitored carefully while you are receiving thismedicine. This medicine may increase the side effects of 5-fluorouracil, 5-FU. Tell your doctor or health care professional if you have diarrhea or mouth sores that donot get better or that get worse. What side effects may I notice from receiving this medication? Side effects that you should report to your doctor or health care professionalas soon as possible: allergic reactions like skin rash, itching or hives, swelling of the face, lips, or tongue breathing problems fever, infection mouth sores unusual bleeding or bruising unusually weak or tired Side effects that usually do not  require medical attention (report to yourdoctor or health care professional if they continue or are bothersome): constipation or diarrhea loss of appetite nausea, vomiting This list may not describe all possible side effects. Call your doctor for medical advice about side effects. You may report side effects to FDA at1-800-FDA-1088. Where should I keep my medication? This drug is given in a hospital or clinic and will not be stored at home. NOTE: This sheet is a summary. It may not cover all possible information. If you have questions about this medicine, talk to your doctor, pharmacist, orhealth care provider.  2022 Elsevier/Gold Standard (2007-07-20 16:50:29)  Fluorouracil, 5-FU injection What is this medication? FLUOROURACIL, 5-FU (flure oh YOOR a sil) is a chemotherapy drug. It slows the growth of cancer cells. This medicine is used to treat many types of cancer like breast cancer, colon or rectal cancer, pancreatic cancer, and stomachcancer. This medicine may be used for other purposes; ask your health care provider orpharmacist if you have questions. COMMON BRAND NAME(S): Adrucil What should I tell my care team before I take this medication? They need to know if you have any of these conditions: blood disorders dihydropyrimidine dehydrogenase (DPD) deficiency infection (especially a virus infection such as chickenpox, cold sores, or herpes) kidney disease liver disease malnourished, poor nutrition recent or ongoing radiation therapy an unusual or allergic reaction to fluorouracil, other chemotherapy, other medicines, foods, dyes, or preservatives pregnant or trying to get pregnant breast-feeding How should I use this medication? This drug is given as an infusion or injection into a vein. It is administeredin a hospital or clinic by a specially trained health care professional. Talk to your pediatrician regarding the use of this medicine in children.Special care may be  needed. Overdosage: If you think you have taken too much of this medicine contact apoison control center or emergency room at once. NOTE: This medicine is only for you. Do not share this medicine with others. What if I miss a dose? It is important not to miss your dose. Call your doctor or health careprofessional if you are unable to keep an appointment. What may interact with this medication? Do not take this medicine with any of the following medications: live virus vaccines This medicine may also interact with the following medications: medicines that treat or prevent blood clots like warfarin, enoxaparin, and dalteparin This list may not describe all possible interactions. Give your health care provider a list of all the medicines, herbs, non-prescription drugs, or dietary supplements you use. Also tell them if you smoke, drink alcohol, or use illegaldrugs. Some items may interact with your medicine. What should I watch for while using this medication? Visit your doctor for checks  on your progress. This drug may make you feel generally unwell. This is not uncommon, as chemotherapy can affect healthy cells as well as cancer cells. Report any side effects. Continue your course oftreatment even though you feel ill unless your doctor tells you to stop. In some cases, you may be given additional medicines to help with side effects.Follow all directions for their use. Call your doctor or health care professional for advice if you get a fever, chills or sore throat, or other symptoms of a cold or flu. Do not treat yourself. This drug decreases your body's ability to fight infections. Try toavoid being around people who are sick. This medicine may increase your risk to bruise or bleed. Call your doctor orhealth care professional if you notice any unusual bleeding. Be careful brushing and flossing your teeth or using a toothpick because you may get an infection or bleed more easily. If you have any dental  work done,tell your dentist you are receiving this medicine. Avoid taking products that contain aspirin, acetaminophen, ibuprofen, naproxen, or ketoprofen unless instructed by your doctor. These medicines may hide afever. Do not become pregnant while taking this medicine. Women should inform their doctor if they wish to become pregnant or think they might be pregnant. There is a potential for serious side effects to an unborn child. Talk to your health care professional or pharmacist for more information. Do not breast-feed aninfant while taking this medicine. Men should inform their doctor if they wish to father a child. This medicinemay lower sperm counts. Do not treat diarrhea with over the counter products. Contact your doctor ifyou have diarrhea that lasts more than 2 days or if it is severe and watery. This medicine can make you more sensitive to the sun. Keep out of the sun. If you cannot avoid being in the sun, wear protective clothing and use sunscreen.Do not use sun lamps or tanning beds/booths. What side effects may I notice from receiving this medication? Side effects that you should report to your doctor or health care professionalas soon as possible: allergic reactions like skin rash, itching or hives, swelling of the face, lips, or tongue low blood counts - this medicine may decrease the number of white blood cells, red blood cells and platelets. You may be at increased risk for infections and bleeding. signs of infection - fever or chills, cough, sore throat, pain or difficulty passing urine signs of decreased platelets or bleeding - bruising, pinpoint red spots on the skin, black, tarry stools, blood in the urine signs of decreased red blood cells - unusually weak or tired, fainting spells, lightheadedness breathing problems changes in vision chest pain mouth sores nausea and vomiting pain, swelling, redness at site where injected pain, tingling, numbness in the hands or  feet redness, swelling, or sores on hands or feet stomach pain unusual bleeding Side effects that usually do not require medical attention (report to yourdoctor or health care professional if they continue or are bothersome): changes in finger or toe nails diarrhea dry or itchy skin hair loss headache loss of appetite sensitivity of eyes to the light stomach upset unusually teary eyes This list may not describe all possible side effects. Call your doctor for medical advice about side effects. You may report side effects to FDA at1-800-FDA-1088. Where should I keep my medication? This drug is given in a hospital or clinic and will not be stored at home. NOTE: This sheet is a summary. It may not cover all possible information. If  you have questions about this medicine, talk to your doctor, pharmacist, orhealth care provider.  2022 Elsevier/Gold Standard (2018-12-14 15:00:03)

## 2020-07-28 ENCOUNTER — Inpatient Hospital Stay: Payer: Medicare HMO | Attending: Oncology

## 2020-07-28 ENCOUNTER — Other Ambulatory Visit: Payer: Self-pay

## 2020-07-28 VITALS — BP 144/66 | HR 66 | Temp 99.0°F | Resp 18 | Ht 68.0 in

## 2020-07-28 DIAGNOSIS — D696 Thrombocytopenia, unspecified: Secondary | ICD-10-CM | POA: Diagnosis not present

## 2020-07-28 DIAGNOSIS — E119 Type 2 diabetes mellitus without complications: Secondary | ICD-10-CM | POA: Diagnosis not present

## 2020-07-28 DIAGNOSIS — K831 Obstruction of bile duct: Secondary | ICD-10-CM | POA: Insufficient documentation

## 2020-07-28 DIAGNOSIS — E785 Hyperlipidemia, unspecified: Secondary | ICD-10-CM | POA: Insufficient documentation

## 2020-07-28 DIAGNOSIS — Z794 Long term (current) use of insulin: Secondary | ICD-10-CM | POA: Insufficient documentation

## 2020-07-28 DIAGNOSIS — Z5189 Encounter for other specified aftercare: Secondary | ICD-10-CM | POA: Diagnosis not present

## 2020-07-28 DIAGNOSIS — C250 Malignant neoplasm of head of pancreas: Secondary | ICD-10-CM | POA: Diagnosis present

## 2020-07-28 DIAGNOSIS — Z5111 Encounter for antineoplastic chemotherapy: Secondary | ICD-10-CM | POA: Insufficient documentation

## 2020-07-28 DIAGNOSIS — H409 Unspecified glaucoma: Secondary | ICD-10-CM | POA: Diagnosis not present

## 2020-07-28 MED ORDER — HEPARIN SOD (PORK) LOCK FLUSH 100 UNIT/ML IV SOLN
500.0000 [IU] | Freq: Once | INTRAVENOUS | Status: AC | PRN
  Administered 2020-07-28: 500 [IU]
  Filled 2020-07-28: qty 5

## 2020-07-28 MED ORDER — SODIUM CHLORIDE 0.9% FLUSH
10.0000 mL | INTRAVENOUS | Status: DC | PRN
  Administered 2020-07-28: 10 mL
  Filled 2020-07-28: qty 10

## 2020-07-28 MED ORDER — PEGFILGRASTIM-CBQV 6 MG/0.6ML ~~LOC~~ SOSY
6.0000 mg | PREFILLED_SYRINGE | Freq: Once | SUBCUTANEOUS | Status: AC
  Administered 2020-07-28: 6 mg via SUBCUTANEOUS

## 2020-07-28 NOTE — Patient Instructions (Signed)
Pegfilgrastim injection What is this medication? PEGFILGRASTIM (PEG fil gra stim) is a long-acting granulocyte colony-stimulating factor that stimulates the growth of neutrophils, a type of white blood cell important in the body's fight against infection. It is used to reduce the incidence of fever and infection in patients with certain types of cancer who are receiving chemotherapy that affects the bone marrow, and toincrease survival after being exposed to high doses of radiation. This medicine may be used for other purposes; ask your health care provider orpharmacist if you have questions. COMMON BRAND NAME(S): Rexene Edison, Ziextenzo What should I tell my care team before I take this medication? They need to know if you have any of these conditions: kidney disease latex allergy ongoing radiation therapy sickle cell disease skin reactions to acrylic adhesives (On-Body Injector only) an unusual or allergic reaction to pegfilgrastim, filgrastim, other medicines, foods, dyes, or preservatives pregnant or trying to get pregnant breast-feeding How should I use this medication? This medicine is for injection under the skin. If you get this medicine at home, you will be taught how to prepare and give the pre-filled syringe or how to use the On-body Injector. Refer to the patient Instructions for Use for detailed instructions. Use exactly as directed. Tell your healthcare provider immediately if you suspect that the On-body Injector may not have performed as intended or if you suspect the use of the On-body Injector resulted in a missedor partial dose. It is important that you put your used needles and syringes in a special sharps container. Do not put them in a trash can. If you do not have a sharpscontainer, call your pharmacist or healthcare provider to get one. Talk to your pediatrician regarding the use of this medicine in children. Whilethis drug may be prescribed for  selected conditions, precautions do apply. Overdosage: If you think you have taken too much of this medicine contact apoison control center or emergency room at once. NOTE: This medicine is only for you. Do not share this medicine with others. What if I miss a dose? It is important not to miss your dose. Call your doctor or health care professional if you miss your dose. If you miss a dose due to an On-body Injector failure or leakage, a new dose should be administered as soon aspossible using a single prefilled syringe for manual use. What may interact with this medication? Interactions have not been studied. This list may not describe all possible interactions. Give your health care provider a list of all the medicines, herbs, non-prescription drugs, or dietary supplements you use. Also tell them if you smoke, drink alcohol, or use illegaldrugs. Some items may interact with your medicine. What should I watch for while using this medication? Your condition will be monitored carefully while you are receiving thismedicine. You may need blood work done while you are taking this medicine. Talk to your health care provider about your risk of cancer. You may be more atrisk for certain types of cancer if you take this medicine. If you are going to need a MRI, CT scan, or other procedure, tell your doctorthat you are using this medicine (On-Body Injector only). What side effects may I notice from receiving this medication? Side effects that you should report to your doctor or health care professionalas soon as possible: allergic reactions (skin rash, itching or hives, swelling of the face, lips, or tongue) back pain dizziness fever pain, redness, or irritation at site where injected pinpoint red spots on the  skin red or dark-brown urine shortness of breath or breathing problems stomach or side pain, or pain at the shoulder swelling tiredness trouble passing urine or change in the amount of  urine unusual bruising or bleeding Side effects that usually do not require medical attention (report to yourdoctor or health care professional if they continue or are bothersome): bone pain muscle pain This list may not describe all possible side effects. Call your doctor for medical advice about side effects. You may report side effects to FDA at1-800-FDA-1088. Where should I keep my medication? Keep out of the reach of children. If you are using this medicine at home, you will be instructed on how to storeit. Throw away any unused medicine after the expiration date on the label. NOTE: This sheet is a summary. It may not cover all possible information. If you have questions about this medicine, talk to your doctor, pharmacist, orhealth care provider.  2022 Elsevier/Gold Standard (2020-02-10 11:54:14) Implanted [facility] Guide An implanted port is a device that is placed under the skin. It is usually placed in the chest. The device can be used to give IV medicine, to take blood, or for dialysis. You may have an implanted port if: You need IV medicine that would be irritating to the small veins in your hands or arms. You need IV medicines, such as antibiotics, for a long period of time. You need IV nutrition for a long period of time. You need dialysis. When you have a port, your health care provider can choose to use the port instead of veins in your arms for these procedures. You may have fewer limitations when using a port than you would if you used other types of long-term IVs, and you will likely be able to return to normal activities afteryour incision heals. An implanted port has two main parts: Reservoir. The reservoir is the part where a needle is inserted to give medicines or draw blood. The reservoir is round. After it is placed, it appears as a small, raised area under your skin. Catheter. The catheter is a thin, flexible tube that connects the reservoir to a vein. Medicine that is  inserted into the reservoir goes into the catheter and then into the vein. How is my port accessed? To access your port: A numbing cream may be placed on the skin over the port site. Your health care provider will put on a mask and sterile gloves. The skin over your port will be cleaned carefully with a germ-killing soap and allowed to dry. Your health care provider will gently pinch the port and insert a needle into it. Your health care provider will check for a blood return to make sure the port is in the vein and is not clogged. If your port needs to remain accessed to get medicine continuously (constant infusion), your health care provider will place a clear bandage (dressing) over the needle site. The dressing and needle will need to be changed every week, or as told by your health care provider. What is flushing? Flushing helps keep the port from getting clogged. Follow instructions from your health care provider about how and when to flush the port. Ports are usually flushed with saline solution or a medicine called heparin. The need for flushing will depend on how the port is used: If the port is only used from time to time to give medicines or draw blood, the port may need to be flushed: Before and after medicines have been given. Before  and after blood has been drawn. As part of routine maintenance. Flushing may be recommended every 4-6 weeks. If a constant infusion is running, the port may not need to be flushed. Throw away any syringes in a disposal container that is meant for sharp items (sharps container). You can buy a sharps container from a pharmacy, or you can make one by using an empty hard plastic bottle with a cover. How long will my port stay implanted? The port can stay in for as long as your health care provider thinks it is needed. When it is time for the port to come out, a surgery will be done to remove it. The surgery will be similar to the procedure that was done to  putthe port in. Follow these instructions at home:  Flush your port as told by your health care provider. If you need an infusion over several days, follow instructions from your health care provider about how to take care of your port site. Make sure you: Wash your hands with soap and water before you change your dressing. If soap and water are not available, use alcohol-based hand sanitizer. Change your dressing as told by your health care provider. Place any used dressings or infusion bags into a plastic bag. Throw that bag in the trash. Keep the dressing that covers the needle clean and dry. Do not get it wet. Do not use scissors or sharp objects near the tube. Keep the tube clamped, unless it is being used. Check your port site every day for signs of infection. Check for: Redness, swelling, or pain. Fluid or blood. Pus or a bad smell. Protect the skin around the port site. Avoid wearing bra straps that rub or irritate the site. Protect the skin around your port from seat belts. Place a soft pad over your chest if needed. Bathe or shower as told by your health care provider. The site may get wet as long as you are not actively receiving an infusion. Return to your normal activities as told by your health care provider. Ask your health care provider what activities are safe for you. Carry a medical alert card or wear a medical alert bracelet at all times. This will let health care providers know that you have an implanted port in case of an emergency. Get help right away if: You have redness, swelling, or pain at the port site. You have fluid or blood coming from your port site. You have pus or a bad smell coming from the port site. You have a fever. Summary Implanted ports are usually placed in the chest for long-term IV access. Follow instructions from your health care provider about flushing the port and changing bandages (dressings). Take care of the area around your port by  avoiding clothing that puts pressure on the area, and by watching for signs of infection. Protect the skin around your port from seat belts. Place a soft pad over your chest if needed. Get help right away if you have a fever or you have redness, swelling, pain, drainage, or a bad smell at the port site. This information is not intended to replace advice given to you by your health care provider. Make sure you discuss any questions you have with your healthcare provider. Document Revised: 05/30/2019 Document Reviewed: 05/30/2019 Elsevier Patient Education  [place].

## 2020-08-05 ENCOUNTER — Other Ambulatory Visit: Payer: Self-pay | Admitting: Oncology

## 2020-08-07 ENCOUNTER — Telehealth: Payer: Self-pay

## 2020-08-07 NOTE — Telephone Encounter (Signed)
TC from Allisonia from [id number] with Dr.Clancy's office inquiring if pt was on schedule for treatment cycle 7. Returned call Left on vm message that Pt is scheduled to receive treatment cycle 7 this Thursday 07/10/20.

## 2020-08-08 ENCOUNTER — Inpatient Hospital Stay: Payer: Medicare HMO

## 2020-08-08 ENCOUNTER — Other Ambulatory Visit: Payer: Self-pay

## 2020-08-08 ENCOUNTER — Inpatient Hospital Stay (HOSPITAL_BASED_OUTPATIENT_CLINIC_OR_DEPARTMENT_OTHER): Payer: Medicare HMO | Admitting: Nurse Practitioner

## 2020-08-08 ENCOUNTER — Encounter: Payer: Self-pay | Admitting: Nurse Practitioner

## 2020-08-08 VITALS — BP 117/76 | HR 69 | Temp 97.2°F | Resp 18 | Wt 174.6 lb

## 2020-08-08 DIAGNOSIS — Z794 Long term (current) use of insulin: Secondary | ICD-10-CM | POA: Diagnosis not present

## 2020-08-08 DIAGNOSIS — Z5111 Encounter for antineoplastic chemotherapy: Secondary | ICD-10-CM | POA: Diagnosis not present

## 2020-08-08 DIAGNOSIS — E785 Hyperlipidemia, unspecified: Secondary | ICD-10-CM | POA: Diagnosis not present

## 2020-08-08 DIAGNOSIS — E119 Type 2 diabetes mellitus without complications: Secondary | ICD-10-CM | POA: Diagnosis not present

## 2020-08-08 DIAGNOSIS — C250 Malignant neoplasm of head of pancreas: Secondary | ICD-10-CM

## 2020-08-08 DIAGNOSIS — Z95828 Presence of other vascular implants and grafts: Secondary | ICD-10-CM

## 2020-08-08 DIAGNOSIS — D696 Thrombocytopenia, unspecified: Secondary | ICD-10-CM | POA: Diagnosis not present

## 2020-08-08 DIAGNOSIS — Z5189 Encounter for other specified aftercare: Secondary | ICD-10-CM | POA: Diagnosis not present

## 2020-08-08 DIAGNOSIS — H409 Unspecified glaucoma: Secondary | ICD-10-CM | POA: Diagnosis not present

## 2020-08-08 DIAGNOSIS — K831 Obstruction of bile duct: Secondary | ICD-10-CM | POA: Diagnosis not present

## 2020-08-08 LAB — CMP (CANCER CENTER ONLY)
ALT: 31 U/L (ref 0–44)
AST: 21 U/L (ref 15–41)
Albumin: 4.3 g/dL (ref 3.5–5.0)
Alkaline Phosphatase: 183 U/L — ABNORMAL HIGH (ref 38–126)
Anion gap: 7 (ref 5–15)
BUN: 16 mg/dL (ref 8–23)
CO2: 29 mmol/L (ref 22–32)
Calcium: 9.2 mg/dL (ref 8.9–10.3)
Chloride: 104 mmol/L (ref 98–111)
Creatinine: 0.73 mg/dL (ref 0.61–1.24)
GFR, Estimated: >60 mL/min (ref >60)
Glucose, Bld: 99 mg/dL (ref 70–99)
Potassium: 4.3 mmol/L (ref 3.5–5.1)
Sodium: 140 mmol/L (ref 135–145)
Total Bilirubin: 0.5 mg/dL (ref 0.3–1.2)
Total Protein: 6.2 g/dL — ABNORMAL LOW (ref 6.5–8.1)

## 2020-08-08 LAB — CBC WITH DIFFERENTIAL (CANCER CENTER ONLY)
Abs Immature Granulocytes: 0.69 10*3/uL — ABNORMAL HIGH (ref 0.00–0.07)
Basophils Absolute: 0.1 10*3/uL (ref 0.0–0.1)
Basophils Relative: 0 %
Eosinophils Absolute: 0 10*3/uL (ref 0.0–0.5)
Eosinophils Relative: 0 %
HCT: 41.3 % (ref 39.0–52.0)
Hemoglobin: 13.5 g/dL (ref 13.0–17.0)
Immature Granulocytes: 3 %
Lymphocytes Relative: 23 %
Lymphs Abs: 5 10*3/uL — ABNORMAL HIGH (ref 0.7–4.0)
MCH: 30.1 pg (ref 26.0–34.0)
MCHC: 32.7 g/dL (ref 30.0–36.0)
MCV: 92.2 fL (ref 80.0–100.0)
Monocytes Absolute: 2.4 10*3/uL — ABNORMAL HIGH (ref 0.1–1.0)
Monocytes Relative: 11 %
Neutro Abs: 13.3 10*3/uL — ABNORMAL HIGH (ref 1.7–7.7)
Neutrophils Relative %: 63 %
Platelet Count: 82 10*3/uL — ABNORMAL LOW (ref 150–400)
RBC: 4.48 MIL/uL (ref 4.22–5.81)
RDW: 16 % — ABNORMAL HIGH (ref 11.5–15.5)
WBC Count: 21.5 10*3/uL — ABNORMAL HIGH (ref 4.0–10.5)
nRBC: 0 % (ref 0.0–0.2)

## 2020-08-08 MED ORDER — SODIUM CHLORIDE 0.9% FLUSH
10.0000 mL | Freq: Once | INTRAVENOUS | Status: AC
  Administered 2020-08-08: 10 mL via INTRAVENOUS
  Filled 2020-08-08: qty 10

## 2020-08-08 MED ORDER — HEPARIN SOD (PORK) LOCK FLUSH 100 UNIT/ML IV SOLN
500.0000 [IU] | Freq: Once | INTRAVENOUS | Status: AC
Start: 2020-08-08 — End: 2020-08-08
  Administered 2020-08-08: 500 [IU] via INTRAVENOUS
  Filled 2020-08-08: qty 5

## 2020-08-08 NOTE — Progress Notes (Signed)
[facility] OFFICE PROGRESS NOTE   Diagnosis: Pancreas cancer  INTERVAL HISTORY:   Mr. Dafoe returns as scheduled.  He completed cycle 6 FOLFIRINOX 07/26/2020.  He denies nausea/vomiting.  No mouth sores.  No diarrhea.  Cold sensitivity lasted "a few days".  No persistent neuropathy symptoms.  He reports good control of blood sugars.  No bleeding.  Objective:  Vital signs in last 24 hours:  Blood pressure 117/76, pulse 69, temperature (!) 97.2 F (36.2 C), temperature source Temporal, resp. rate 18, weight 174 lb 9.6 oz (79.2 kg), SpO2 99 %.    HEENT: No thrush or ulcers. Resp: Lungs clear bilaterally. Cardio: Regular rate and rhythm. GI: Abdomen soft and nontender.  No hepatomegaly. Vascular: No leg edema. Neuro: Vibratory sense intact over the fingertips per tuning fork exam. Skin: Palms without erythema. Port-A-Cath without erythema.  Lab Results:  Lab Results  Component Value Date   WBC 21.5 (H) 08/08/2020   HGB 13.5 08/08/2020   HCT 41.3 08/08/2020   MCV 92.2 08/08/2020   PLT 82 (L) 08/08/2020   NEUTROABS 13.3 (H) 08/08/2020    Imaging:  No results found.  Medications: I have reviewed the patient's current medications.  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  Fat planes around the celiac axis preserved.  Less than 90 degree contact of the lateral wall of the  SMA, no evidence of metastatic disease, 3 mm left lower lobe pulmonary nodule Cycle 1 FOLFOX 05/09/2020 (plan to add irinotecan with cycle 2 pending tolerance) Cycle 2 FOLFIRINOX 05/23/2020 Cycle 3 FOLFIRINOX 06/06/2020, oxaliplatin dose reduced secondary to thrombocytopenia Cycle 4 FOLFIRINOX 06/27/2020, oxaliplatin further dose reduced secondary to thrombocytopenia CTs at [facility] 07/03/2020-redemonstration of a heterogeneous pancreatic head mass with borderline encasement of the SMA as well as abutment of the portal vein, SMV and likely the IVC.  Similar mildly enlarged porta hepatis and pericaval lymph nodes.  No convincing evidence of distant metastatic disease. Cycle 5 FOLFIRINOX 07/12/2020 Cycle 6 FOLFIRINOX 07/26/2020 Cycle 7 FOLFIRINOX 08/08/2020 Obstructive jaundice secondary #1 ERCP 04/11/2020-prominent and floppy major papilla, dilated bile duct secondary to a distal stricture, placement of an uncovered metal stent, bile duct brushings-reactive glandular cells 3.   Diabetes   4.   Hyperlipidemia   5.   Glaucoma   6.   Port-A-Cath placement 05/04/2020 Interventional Radiology   7.   Mild thrombocytopenia (121,000) 05/09/2020    Disposition: Mr. Norgaard appears stable.  He has completed 6 cycles of FOLFIRINOX.  He continues to tolerate chemotherapy well.  Plan to proceed with cycle 7 as scheduled 08/09/2020.  We reviewed the CBC from today.  Counts adequate to proceed with treatment.  He has stable moderate thrombocytopenia.  He will contact the office with bleeding.  The nurse was unable to obtain blood from the Port-A-Cath.  We discussed placing tPA today versus tomorrow morning if needed.  He does not wish to have the Port-A-Cath accessed again today.  He will come into the office tomorrow morning at  8 AM for Port-A-Cath access.  If no blood return tPA will be placed.  He will return for lab, follow-up, cycle 8 FOLFIRINOX in 2 weeks.  We are available to see him sooner if needed.    Jaun Galluzzo ANP/GNP-BC   08/08/2020  3:21 PM

## 2020-08-09 ENCOUNTER — Inpatient Hospital Stay: Payer: Medicare HMO

## 2020-08-09 VITALS — BP 117/76 | HR 68 | Temp 98.6°F | Resp 18

## 2020-08-09 DIAGNOSIS — E119 Type 2 diabetes mellitus without complications: Secondary | ICD-10-CM | POA: Diagnosis not present

## 2020-08-09 DIAGNOSIS — Z5111 Encounter for antineoplastic chemotherapy: Secondary | ICD-10-CM | POA: Diagnosis not present

## 2020-08-09 DIAGNOSIS — K831 Obstruction of bile duct: Secondary | ICD-10-CM | POA: Diagnosis not present

## 2020-08-09 DIAGNOSIS — C250 Malignant neoplasm of head of pancreas: Secondary | ICD-10-CM

## 2020-08-09 DIAGNOSIS — Z794 Long term (current) use of insulin: Secondary | ICD-10-CM | POA: Diagnosis not present

## 2020-08-09 DIAGNOSIS — H409 Unspecified glaucoma: Secondary | ICD-10-CM | POA: Diagnosis not present

## 2020-08-09 DIAGNOSIS — Z5189 Encounter for other specified aftercare: Secondary | ICD-10-CM | POA: Diagnosis not present

## 2020-08-09 DIAGNOSIS — D696 Thrombocytopenia, unspecified: Secondary | ICD-10-CM | POA: Diagnosis not present

## 2020-08-09 DIAGNOSIS — E785 Hyperlipidemia, unspecified: Secondary | ICD-10-CM | POA: Diagnosis not present

## 2020-08-09 LAB — CANCER ANTIGEN 19-9: CA 19-9: 179 U/mL — ABNORMAL HIGH (ref 0–35)

## 2020-08-09 MED ORDER — OXALIPLATIN CHEMO INJECTION 100 MG/20ML
50.0000 mg/m2 | Freq: Once | INTRAVENOUS | Status: AC
  Administered 2020-08-09: 95 mg via INTRAVENOUS
  Filled 2020-08-09: qty 19

## 2020-08-09 MED ORDER — SODIUM CHLORIDE 0.9 % IV SOLN
2000.0000 mg/m2 | INTRAVENOUS | Status: DC
  Administered 2020-08-09: 3850 mg via INTRAVENOUS
  Filled 2020-08-09: qty 77

## 2020-08-09 MED ORDER — SODIUM CHLORIDE 0.9 % IV SOLN
150.0000 mg/m2 | Freq: Once | INTRAVENOUS | Status: AC
  Administered 2020-08-09: 280 mg via INTRAVENOUS
  Filled 2020-08-09: qty 14

## 2020-08-09 MED ORDER — DEXTROSE 5 % IV SOLN
Freq: Once | INTRAVENOUS | Status: AC
Start: 2020-08-09 — End: 2020-08-09
  Filled 2020-08-09: qty 250

## 2020-08-09 MED ORDER — ATROPINE SULFATE 1 MG/ML IJ SOLN
0.5000 mg | Freq: Once | INTRAMUSCULAR | Status: DC | PRN
  Filled 2020-08-09: qty 1

## 2020-08-09 MED ORDER — SODIUM CHLORIDE 0.9 % IV SOLN
400.0000 mg/m2 | Freq: Once | INTRAVENOUS | Status: AC
  Administered 2020-08-09: 768 mg via INTRAVENOUS
  Filled 2020-08-09: qty 38.4

## 2020-08-09 MED ORDER — PALONOSETRON HCL INJECTION 0.25 MG/5ML
0.2500 mg | Freq: Once | INTRAVENOUS | Status: AC
  Administered 2020-08-09: 0.25 mg via INTRAVENOUS
  Filled 2020-08-09: qty 5

## 2020-08-09 MED ORDER — SODIUM CHLORIDE 0.9 % IV SOLN
10.0000 mg | Freq: Once | INTRAVENOUS | Status: AC
  Administered 2020-08-09: 10 mg via INTRAVENOUS
  Filled 2020-08-09: qty 1

## 2020-08-09 MED ORDER — DEXTROSE 5 % IV SOLN
Freq: Once | INTRAVENOUS | Status: AC
  Filled 2020-08-09: qty 250

## 2020-08-09 MED ORDER — SODIUM CHLORIDE 0.9 % IV SOLN
150.0000 mg | Freq: Once | INTRAVENOUS | Status: AC
  Administered 2020-08-09: 150 mg via INTRAVENOUS
  Filled 2020-08-09: qty 5

## 2020-08-09 NOTE — Patient Instructions (Signed)
[facility]  Discharge Instructions: Thank you for choosing [facility] to provide your oncology and hematology care.   If you have a lab appointment with the [facility], please go directly to the [facility] and check in at the registration area.   Wear comfortable clothing and clothing appropriate for easy access to any Portacath or PICC line.   We strive to give you quality time with your provider. You may need to reschedule your appointment if you arrive late (15 or more minutes).  Arriving late affects you and other patients whose appointments are after yours.  Also, if you miss three or more appointments without notifying the office, you may be dismissed from the clinic at the provider's discretion.      For prescription refill requests, have your pharmacy contact our office and allow 72 hours for refills to be completed.    Today you received the following chemotherapy and/or immunotherapy agents oxaliplatin, leucovorin, irinotecan, fluorouracil   To help prevent nausea and vomiting after your treatment, we encourage you to take your nausea medication as directed.  BELOW ARE SYMPTOMS THAT SHOULD BE REPORTED IMMEDIATELY: *FEVER GREATER THAN 100.4 F (38 C) OR HIGHER *CHILLS OR SWEATING *NAUSEA AND VOMITING THAT IS NOT CONTROLLED WITH YOUR NAUSEA MEDICATION *UNUSUAL SHORTNESS OF BREATH *UNUSUAL BRUISING OR BLEEDING *URINARY PROBLEMS (pain or burning when urinating, or frequent urination) *BOWEL PROBLEMS (unusual diarrhea, constipation, pain near the anus) TENDERNESS IN MOUTH AND THROAT WITH OR WITHOUT PRESENCE OF ULCERS (sore throat, sores in mouth, or a toothache) UNUSUAL RASH, SWELLING OR PAIN  UNUSUAL VAGINAL DISCHARGE OR ITCHING   Items with * indicate a potential emergency and should be followed up as soon as possible or go to the Emergency Department if any problems should occur.  Please show the CHEMOTHERAPY ALERT CARD or  IMMUNOTHERAPY ALERT CARD at check-in to the Emergency Department and triage nurse.  Should you have questions after your visit or need to cancel or reschedule your appointment, please contact [facility]  Dept: [phone]  and follow the prompts.  Office hours are 8:00 a.m. to 4:30 p.m. Monday - Friday. Please note that voicemails left after 4:00 p.m. may not be returned until the following business day.  We are closed weekends and major holidays. You have access to a nurse at all times for urgent questions. Please call the main number to the clinic Dept: [phone] and follow the prompts.   For any non-urgent questions, you may also contact your provider using MyChart. We now offer e-Visits for anyone 53 and older to request care online for non-urgent symptoms. For details visit [email or web address].   Also download the MyChart app! Go to the app store, search "MyChart", open the app, select [facility], and log in with your MyChart username and password.  Due to Covid, a mask is required upon entering the hospital/clinic. If you do not have a mask, one will be given to you upon arrival. For doctor visits, patients may have 1 support person aged 35 or older with them. For treatment visits, patients cannot have anyone with them due to current Covid guidelines and our immunocompromised population.   Oxaliplatin Injection What is this medication? OXALIPLATIN (ox AL i PLA tin) is a chemotherapy drug. It targets fast dividing cells, like cancer cells, and causes these cells to die. This medicine is usedto treat cancers of the colon and rectum, and many other cancers. This medicine may be  used for other purposes; ask your health care provider orpharmacist if you have questions. COMMON BRAND NAME(S): Eloxatin What should I tell my care team before I take this medication? They need to know if you have any of these conditions: heart disease history of irregular  heartbeat liver disease low blood counts, like white cells, platelets, or red blood cells lung or breathing disease, like asthma take medicines that treat or prevent blood clots tingling of the fingers or toes, or other nerve disorder an unusual or allergic reaction to oxaliplatin, other chemotherapy, other medicines, foods, dyes, or preservatives pregnant or trying to get pregnant breast-feeding How should I use this medication? This drug is given as an infusion into a vein. It is administered in a hospitalor clinic by a specially trained health care professional. Talk to your pediatrician regarding the use of this medicine in children.Special care may be needed. Overdosage: If you think you have taken too much of this medicine contact apoison control center or emergency room at once. NOTE: This medicine is only for you. Do not share this medicine with others. What if I miss a dose? It is important not to miss a dose. Call your doctor or health careprofessional if you are unable to keep an appointment. What may interact with this medication? Do not take this medicine with any of the following medications: cisapride dronedarone pimozide thioridazine This medicine may also interact with the following medications: aspirin and aspirin-like medicines certain medicines that treat or prevent blood clots like warfarin, apixaban, dabigatran, and rivaroxaban cisplatin cyclosporine diuretics medicines for infection like acyclovir, adefovir, amphotericin B, bacitracin, cidofovir, foscarnet, ganciclovir, gentamicin, pentamidine, vancomycin NSAIDs, medicines for pain and inflammation, like ibuprofen or naproxen other medicines that prolong the QT interval (an abnormal heart rhythm) pamidronate zoledronic acid This list may not describe all possible interactions. Give your health care provider a list of all the medicines, herbs, non-prescription drugs, or dietary supplements you use. Also tell  them if you smoke, drink alcohol, or use illegaldrugs. Some items may interact with your medicine. What should I watch for while using this medication? Your condition will be monitored carefully while you are receiving thismedicine. You may need blood work done while you are taking this medicine. This medicine may make you feel generally unwell. This is not uncommon as chemotherapy can affect healthy cells as well as cancer cells. Report any side effects. Continue your course of treatment even though you feel ill unless yourhealthcare professional tells you to stop. This medicine can make you more sensitive to cold. Do not drink cold drinks or use ice. Cover exposed skin before coming in contact with cold temperatures or cold objects. When out in cold weather wear warm clothing and cover your mouth and nose to warm the air that goes into your lungs. Tell your doctor if you getsensitive to the cold. Do not become pregnant while taking this medicine or for 9 months after stopping it. Women should inform their health care professional if they wish to become pregnant or think they might be pregnant. Men should not father a child while taking this medicine and for 6 months after stopping it. There is potential for serious side effects to an unborn child. Talk to your health careprofessional for more information. Do not breast-feed a child while taking this medicine or for 3 months afterstopping it. This medicine has caused ovarian failure in some women. This medicine may make it more difficult to get pregnant. Talk to your health care professional  if youare concerned about your fertility. This medicine has caused decreased sperm counts in some men. This may make it more difficult to father a child. Talk to your health care professional if [place] Sellers concerned about your fertility. This medicine may increase your risk of getting an infection. Call your health care professional for advice if you get a fever, chills,  or sore throat, or other symptoms of a cold or flu. Do not treat yourself. Try to avoid beingaround people who are sick. Avoid taking medicines that contain aspirin, acetaminophen, ibuprofen, naproxen, or ketoprofen unless instructed by your health care professional.These medicines may hide a fever. Be careful brushing or flossing your teeth or using a toothpick because you may get an infection or bleed more easily. If you have any dental work done, Primary school teacher you are receiving this medicine. What side effects may I notice from receiving this medication? Side effects that you should report to your doctor or health care professionalas soon as possible: allergic reactions like skin rash, itching or hives, swelling of the face, lips, or tongue breathing problems cough low blood counts - this medicine may decrease the number of white blood cells, red blood cells, and platelets. You may be at increased risk for infections and bleeding nausea, vomiting pain, redness, or irritation at site where injected pain, tingling, numbness in the hands or feet signs and symptoms of bleeding such as bloody or black, tarry stools; red or dark brown urine; spitting up blood or brown material that looks like coffee grounds; red spots on the skin; unusual bruising or bleeding from the eyes, gums, or nose signs and symptoms of a dangerous change in heartbeat or heart rhythm like chest pain; dizziness; fast, irregular heartbeat; palpitations; feeling faint or lightheaded; falls signs and symptoms of infection like fever; chills; cough; sore throat; pain or trouble passing urine signs and symptoms of liver injury like dark yellow or brown urine; general ill feeling or flu-like symptoms; light-colored stools; loss of appetite; nausea; right upper belly pain; unusually weak or tired; yellowing of the eyes or skin signs and symptoms of low red blood cells or anemia such as unusually weak or tired; feeling faint or  lightheaded; falls signs and symptoms of muscle injury like dark urine; trouble passing urine or change in the amount of urine; unusually weak or tired; muscle pain; back pain Side effects that usually do not require medical attention (report to yourdoctor or health care professional if they continue or are bothersome): changes in taste diarrhea gas hair loss loss of appetite mouth sores This list may not describe all possible side effects. Call your doctor for medical advice about side effects. You may report side effects to FDA at1-800-FDA-1088. Where should I keep my medication? This drug is given in a hospital or clinic and will not be stored at home. NOTE: This sheet is a summary. It may not cover all possible information. If you have questions about this medicine, talk to your doctor, pharmacist, orhealth care provider.  2022 Elsevier/Gold Standard (2018-06-02 12:20:35)  Leucovorin injection What is this medication? LEUCOVORIN (loo koe VOR in) is used to prevent or treat the harmful effects of some medicines. This medicine is used to treat anemia caused by a low amount of folic acid in the body. It is also used with 5-fluorouracil (5-FU) to treatcolon cancer. This medicine may be used for other purposes; ask your health care provider orpharmacist if you have questions. What should I tell my care team before I  take this medication? They need to know if you have any of these conditions: anemia from low levels of vitamin B-12 in the blood an unusual or allergic reaction to leucovorin, folic acid, other medicines, foods, dyes, or preservatives pregnant or trying to get pregnant breast-feeding How should I use this medication? This medicine is for injection into a muscle or into a vein. It is given by ahealth care professional in a hospital or clinic setting. Talk to your pediatrician regarding the use of this medicine in children.Special care may be needed. Overdosage: If you think you  have taken too much of this medicine contact apoison control center or emergency room at once. NOTE: This medicine is only for you. Do not share this medicine with others. What if I miss a dose? This does not apply. What may interact with this medication? capecitabine fluorouracil phenobarbital phenytoin primidone trimethoprim-sulfamethoxazole This list may not describe all possible interactions. Give your health care provider a list of all the medicines, herbs, non-prescription drugs, or dietary supplements you use. Also tell them if you smoke, drink alcohol, or use illegaldrugs. Some items may interact with your medicine. What should I watch for while using this medication? Your condition will be monitored carefully while you are receiving thismedicine. This medicine may increase the side effects of 5-fluorouracil, 5-FU. Tell your doctor or health care professional if you have diarrhea or mouth sores that donot get better or that get worse. What side effects may I notice from receiving this medication? Side effects that you should report to your doctor or health care professionalas soon as possible: allergic reactions like skin rash, itching or hives, swelling of the face, lips, or tongue breathing problems fever, infection mouth sores unusual bleeding or bruising unusually weak or tired Side effects that usually do not require medical attention (report to yourdoctor or health care professional if they continue or are bothersome): constipation or diarrhea loss of appetite nausea, vomiting This list may not describe all possible side effects. Call your doctor for medical advice about side effects. You may report side effects to FDA at1-800-FDA-1088. Where should I keep my medication? This drug is given in a hospital or clinic and will not be stored at home. NOTE: This sheet is a summary. It may not cover all possible information. If you have questions about this medicine, talk to your  doctor, pharmacist, orhealth care provider.  2022 Elsevier/Gold Standard (2007-07-20 16:50:29)  Irinotecan injection What is this medication? IRINOTECAN (ir in oh TEE kan ) is a chemotherapy drug. It is used to treatcolon and rectal cancer. This medicine may be used for other purposes; ask your health care provider orpharmacist if you have questions. COMMON BRAND NAME(S): Camptosar What should I tell my care team before I take this medication? They need to know if you have any of these conditions: dehydration diarrhea infection (especially a virus infection such as chickenpox, cold sores, or herpes) liver disease low blood counts, like low white cell, platelet, or red cell counts low levels of calcium, magnesium, or potassium in the blood recent or ongoing radiation therapy an unusual or allergic reaction to irinotecan, other medicines, foods, dyes, or preservatives pregnant or trying to get pregnant breast-feeding How should I use this medication? This drug is given as an infusion into a vein. It is administered in a hospitalor clinic by a specially trained health care professional. Talk to your pediatrician regarding the use of this medicine in children.Special care may be needed. Overdosage: If you think  you have taken too much of this medicine contact apoison control center or emergency room at once. NOTE: This medicine is only for you. Do not share this medicine with others. What if I miss a dose? It is important not to miss your dose. Call your doctor or health careprofessional if you are unable to keep an appointment. What may interact with this medication? Do not take this medicine with any of the following medications: cobicistat itraconazole This medicine may interact with the following medications: antiviral medicines for HIV or AIDS certain antibiotics like rifampin or rifabutin certain medicines for fungal infections like ketoconazole, posaconazole, and  voriconazole certain medicines for seizures like carbamazepine, phenobarbital, phenotoin clarithromycin gemfibrozil nefazodone [facility]. John's Wort This list may not describe all possible interactions. Give your health care provider a list of all the medicines, herbs, non-prescription drugs, or dietary supplements you use. Also tell them if you smoke, drink alcohol, or use illegaldrugs. Some items may interact with your medicine. What should I watch for while using this medication? Your condition will be monitored carefully while you are receiving this medicine. You will need important blood work done while you are taking thismedicine. This drug may make you feel generally unwell. This is not uncommon, as chemotherapy can affect healthy cells as well as cancer cells. Report any side effects. Continue your course of treatment even though you feel ill unless yourdoctor tells you to stop. In some cases, you may be given additional medicines to help with side effects.Follow all directions for their use. You may get drowsy or dizzy. Do not drive, use machinery, or do anything that needs mental alertness until you know how this medicine affects you. Do not stand or sit up quickly, especially if you are an older patient. This reducesthe risk of dizzy or fainting spells. Call your health care professional for advice if you get a fever, chills, or sore throat, or other symptoms of a cold or flu. Do not treat yourself. This medicine decreases your body's ability to fight infections. Try to avoid beingaround people who are sick. Avoid taking products that contain aspirin, acetaminophen, ibuprofen, naproxen, or ketoprofen unless instructed by your doctor. These medicines may hide afever. This medicine may increase your risk to bruise or bleed. Call your doctor orhealth care professional if you notice any unusual bleeding. Be careful brushing and flossing your teeth or using a toothpick because you may get an infection  or bleed more easily. If you have any dental work done,tell your dentist you are receiving this medicine. Do not become pregnant while taking this medicine or for 6 months after stopping it. Women should inform their health care professional if they wish to become pregnant or think they might be pregnant. Men should not father a child while taking this medicine and for 3 months after stopping it. There is potential for serious side effects to an unborn child. Talk to your health careprofessional for more information. Do not breast-feed an infant while taking this medicine or for 7 days afterstopping it. This medicine has caused ovarian failure in some women. This medicine may make it more difficult to get pregnant. Talk to your health care professional if [place] concerned about your fertility. This medicine has caused decreased sperm counts in some men. This may make it more difficult to father a child. Talk to your health care professional if [place] concerned about your fertility. What side effects may I notice from receiving this medication? Side effects that you should report to  your doctor or health care professionalas soon as possible: allergic reactions like skin rash, itching or hives, swelling of the face, lips, or tongue chest pain diarrhea flushing, runny nose, sweating during infusion low blood counts - this medicine may decrease the number of white blood cells, red blood cells and platelets. You may be at increased risk for infections and bleeding. nausea, vomiting pain, swelling, warmth in the leg signs of decreased platelets or bleeding - bruising, pinpoint red spots on the skin, black, tarry stools, blood in the urine signs of infection - fever or chills, cough, sore throat, pain or difficulty passing urine signs of decreased red blood cells - unusually weak or tired, fainting spells, lightheadedness Side effects that usually do not require medical attention (report to yourdoctor or  health care professional if they continue or are bothersome): constipation hair loss headache loss of appetite mouth sores stomach pain This list may not describe all possible side effects. Call your doctor for medical advice about side effects. You may report side effects to FDA at1-800-FDA-1088. Where should I keep my medication? This drug is given in a hospital or clinic and will not be stored at home. NOTE: This sheet is a summary. It may not cover all possible information. If you have questions about this medicine, talk to your doctor, pharmacist, orhealth care provider.  2022 Elsevier/Gold Standard (2018-12-14 17:46:13)  Fluorouracil, 5-FU injection What is this medication? FLUOROURACIL, 5-FU (flure oh YOOR a sil) is a chemotherapy drug. It slows the growth of cancer cells. This medicine is used to treat many types of cancer like breast cancer, colon or rectal cancer, pancreatic cancer, and stomachcancer. This medicine may be used for other purposes; ask your health care provider orpharmacist if you have questions. COMMON BRAND NAME(S): Adrucil What should I tell my care team before I take this medication? They need to know if you have any of these conditions: blood disorders dihydropyrimidine dehydrogenase (DPD) deficiency infection (especially a virus infection such as chickenpox, cold sores, or herpes) kidney disease liver disease malnourished, poor nutrition recent or ongoing radiation therapy an unusual or allergic reaction to fluorouracil, other chemotherapy, other medicines, foods, dyes, or preservatives pregnant or trying to get pregnant breast-feeding How should I use this medication? This drug is given as an infusion or injection into a vein. It is administeredin a hospital or clinic by a specially trained health care professional. Talk to your pediatrician regarding the use of this medicine in children.Special care may be needed. Overdosage: If you think you have  taken too much of this medicine contact apoison control center or emergency room at once. NOTE: This medicine is only for you. Do not share this medicine with others. What if I miss a dose? It is important not to miss your dose. Call your doctor or health careprofessional if you are unable to keep an appointment. What may interact with this medication? Do not take this medicine with any of the following medications: live virus vaccines This medicine may also interact with the following medications: medicines that treat or prevent blood clots like warfarin, enoxaparin, and dalteparin This list may not describe all possible interactions. Give your health care provider a list of all the medicines, herbs, non-prescription drugs, or dietary supplements you use. Also tell them if you smoke, drink alcohol, or use illegaldrugs. Some items may interact with your medicine. What should I watch for while using this medication? Visit your doctor for checks on your progress. This drug may make you  feel generally unwell. This is not uncommon, as chemotherapy can affect healthy cells as well as cancer cells. Report any side effects. Continue your course oftreatment even though you feel ill unless your doctor tells you to stop. In some cases, you may be given additional medicines to help with side effects.Follow all directions for their use. Call your doctor or health care professional for advice if you get a fever, chills or sore throat, or other symptoms of a cold or flu. Do not treat yourself. This drug decreases your body's ability to fight infections. Try toavoid being around people who are sick. This medicine may increase your risk to bruise or bleed. Call your doctor orhealth care professional if you notice any unusual bleeding. Be careful brushing and flossing your teeth or using a toothpick because you may get an infection or bleed more easily. If you have any dental work done,tell your dentist you are  receiving this medicine. Avoid taking products that contain aspirin, acetaminophen, ibuprofen, naproxen, or ketoprofen unless instructed by your doctor. These medicines may hide afever. Do not become pregnant while taking this medicine. Women should inform their doctor if they wish to become pregnant or think they might be pregnant. There is a potential for serious side effects to an unborn child. Talk to your health care professional or pharmacist for more information. Do not breast-feed aninfant while taking this medicine. Men should inform their doctor if they wish to father a child. This medicinemay lower sperm counts. Do not treat diarrhea with over the counter products. Contact your doctor ifyou have diarrhea that lasts more than 2 days or if it is severe and watery. This medicine can make you more sensitive to the sun. Keep out of the sun. If you cannot avoid being in the sun, wear protective clothing and use sunscreen.Do not use sun lamps or tanning beds/booths. What side effects may I notice from receiving this medication? Side effects that you should report to your doctor or health care professionalas soon as possible: allergic reactions like skin rash, itching or hives, swelling of the face, lips, or tongue low blood counts - this medicine may decrease the number of white blood cells, red blood cells and platelets. You may be at increased risk for infections and bleeding. signs of infection - fever or chills, cough, sore throat, pain or difficulty passing urine signs of decreased platelets or bleeding - bruising, pinpoint red spots on the skin, black, tarry stools, blood in the urine signs of decreased red blood cells - unusually weak or tired, fainting spells, lightheadedness breathing problems changes in vision chest pain mouth sores nausea and vomiting pain, swelling, redness at site where injected pain, tingling, numbness in the hands or feet redness, swelling, or sores on hands or  feet stomach pain unusual bleeding Side effects that usually do not require medical attention (report to yourdoctor or health care professional if they continue or are bothersome): changes in finger or toe nails diarrhea dry or itchy skin hair loss headache loss of appetite sensitivity of eyes to the light stomach upset unusually teary eyes This list may not describe all possible side effects. Call your doctor for medical advice about side effects. You may report side effects to FDA at1-800-FDA-1088. Where should I keep my medication? This drug is given in a hospital or clinic and will not be stored at home. NOTE: This sheet is a summary. It may not cover all possible information. If you have questions about this medicine, talk to  your doctor, pharmacist, orhealth care provider.  2022 Elsevier/Gold Standard (2018-12-14 15:00:03)

## 2020-08-11 ENCOUNTER — Other Ambulatory Visit: Payer: Self-pay

## 2020-08-11 ENCOUNTER — Inpatient Hospital Stay: Payer: Medicare HMO

## 2020-08-11 VITALS — BP 111/72 | HR 69 | Temp 97.4°F | Resp 19

## 2020-08-11 DIAGNOSIS — Z5111 Encounter for antineoplastic chemotherapy: Secondary | ICD-10-CM | POA: Diagnosis not present

## 2020-08-11 DIAGNOSIS — C250 Malignant neoplasm of head of pancreas: Secondary | ICD-10-CM | POA: Diagnosis not present

## 2020-08-11 DIAGNOSIS — Z5189 Encounter for other specified aftercare: Secondary | ICD-10-CM | POA: Diagnosis not present

## 2020-08-11 DIAGNOSIS — D696 Thrombocytopenia, unspecified: Secondary | ICD-10-CM | POA: Diagnosis not present

## 2020-08-11 DIAGNOSIS — Z794 Long term (current) use of insulin: Secondary | ICD-10-CM | POA: Diagnosis not present

## 2020-08-11 DIAGNOSIS — E119 Type 2 diabetes mellitus without complications: Secondary | ICD-10-CM | POA: Diagnosis not present

## 2020-08-11 DIAGNOSIS — E785 Hyperlipidemia, unspecified: Secondary | ICD-10-CM | POA: Diagnosis not present

## 2020-08-11 DIAGNOSIS — K831 Obstruction of bile duct: Secondary | ICD-10-CM | POA: Diagnosis not present

## 2020-08-11 DIAGNOSIS — H409 Unspecified glaucoma: Secondary | ICD-10-CM | POA: Diagnosis not present

## 2020-08-11 MED ORDER — PEGFILGRASTIM-CBQV 6 MG/0.6ML ~~LOC~~ SOSY
6.0000 mg | PREFILLED_SYRINGE | Freq: Once | SUBCUTANEOUS | Status: AC
  Administered 2020-08-11: 6 mg via SUBCUTANEOUS

## 2020-08-18 ENCOUNTER — Other Ambulatory Visit: Payer: Self-pay | Admitting: Oncology

## 2020-08-23 ENCOUNTER — Other Ambulatory Visit: Payer: Self-pay

## 2020-08-23 ENCOUNTER — Inpatient Hospital Stay (HOSPITAL_BASED_OUTPATIENT_CLINIC_OR_DEPARTMENT_OTHER): Payer: Medicare HMO | Admitting: Nurse Practitioner

## 2020-08-23 ENCOUNTER — Encounter: Payer: Self-pay | Admitting: Nurse Practitioner

## 2020-08-23 ENCOUNTER — Inpatient Hospital Stay: Payer: Medicare HMO

## 2020-08-23 VITALS — BP 103/90 | HR 81 | Temp 97.8°F | Resp 18 | Ht 68.0 in | Wt 174.2 lb

## 2020-08-23 DIAGNOSIS — C250 Malignant neoplasm of head of pancreas: Secondary | ICD-10-CM

## 2020-08-23 DIAGNOSIS — D696 Thrombocytopenia, unspecified: Secondary | ICD-10-CM | POA: Diagnosis not present

## 2020-08-23 DIAGNOSIS — E119 Type 2 diabetes mellitus without complications: Secondary | ICD-10-CM | POA: Diagnosis not present

## 2020-08-23 DIAGNOSIS — Z5111 Encounter for antineoplastic chemotherapy: Secondary | ICD-10-CM | POA: Diagnosis not present

## 2020-08-23 DIAGNOSIS — Z5189 Encounter for other specified aftercare: Secondary | ICD-10-CM | POA: Diagnosis not present

## 2020-08-23 DIAGNOSIS — H409 Unspecified glaucoma: Secondary | ICD-10-CM | POA: Diagnosis not present

## 2020-08-23 DIAGNOSIS — E785 Hyperlipidemia, unspecified: Secondary | ICD-10-CM | POA: Diagnosis not present

## 2020-08-23 DIAGNOSIS — K831 Obstruction of bile duct: Secondary | ICD-10-CM | POA: Diagnosis not present

## 2020-08-23 DIAGNOSIS — Z794 Long term (current) use of insulin: Secondary | ICD-10-CM | POA: Diagnosis not present

## 2020-08-23 LAB — CBC WITH DIFFERENTIAL (CANCER CENTER ONLY)
Abs Immature Granulocytes: 0.07 10*3/uL (ref 0.00–0.07)
Basophils Absolute: 0 10*3/uL (ref 0.0–0.1)
Basophils Relative: 0 %
Eosinophils Absolute: 0 10*3/uL (ref 0.0–0.5)
Eosinophils Relative: 0 %
HCT: 38.4 % — ABNORMAL LOW (ref 39.0–52.0)
Hemoglobin: 12.8 g/dL — ABNORMAL LOW (ref 13.0–17.0)
Immature Granulocytes: 1 %
Lymphocytes Relative: 25 %
Lymphs Abs: 2.2 10*3/uL (ref 0.7–4.0)
MCH: 30.8 pg (ref 26.0–34.0)
MCHC: 33.3 g/dL (ref 30.0–36.0)
MCV: 92.3 fL (ref 80.0–100.0)
Monocytes Absolute: 1 10*3/uL (ref 0.1–1.0)
Monocytes Relative: 12 %
Neutro Abs: 5.4 10*3/uL (ref 1.7–7.7)
Neutrophils Relative %: 62 %
Platelet Count: 60 10*3/uL — ABNORMAL LOW (ref 150–400)
RBC: 4.16 MIL/uL — ABNORMAL LOW (ref 4.22–5.81)
RDW: 17.1 % — ABNORMAL HIGH (ref 11.5–15.5)
WBC Count: 8.6 10*3/uL (ref 4.0–10.5)
nRBC: 0 % (ref 0.0–0.2)

## 2020-08-23 LAB — CMP (CANCER CENTER ONLY)
ALT: 22 U/L (ref 0–44)
AST: 19 U/L (ref 15–41)
Albumin: 4.1 g/dL (ref 3.5–5.0)
Alkaline Phosphatase: 141 U/L — ABNORMAL HIGH (ref 38–126)
Anion gap: 8 (ref 5–15)
BUN: 13 mg/dL (ref 8–23)
CO2: 26 mmol/L (ref 22–32)
Calcium: 8.5 mg/dL — ABNORMAL LOW (ref 8.9–10.3)
Chloride: 104 mmol/L (ref 98–111)
Creatinine: 0.65 mg/dL (ref 0.61–1.24)
GFR, Estimated: >60 mL/min (ref >60)
Glucose, Bld: 243 mg/dL — ABNORMAL HIGH (ref 70–99)
Potassium: 4.3 mmol/L (ref 3.5–5.1)
Sodium: 138 mmol/L (ref 135–145)
Total Bilirubin: 0.5 mg/dL (ref 0.3–1.2)
Total Protein: 6 g/dL — ABNORMAL LOW (ref 6.5–8.1)

## 2020-08-23 MED ORDER — SODIUM CHLORIDE 0.9 % IV SOLN
150.0000 mg | Freq: Once | INTRAVENOUS | Status: AC
  Administered 2020-08-23: 150 mg via INTRAVENOUS
  Filled 2020-08-23: qty 5

## 2020-08-23 MED ORDER — PALONOSETRON HCL INJECTION 0.25 MG/5ML
0.2500 mg | Freq: Once | INTRAVENOUS | Status: AC
  Administered 2020-08-23: 0.25 mg via INTRAVENOUS
  Filled 2020-08-23: qty 5

## 2020-08-23 MED ORDER — SODIUM CHLORIDE 0.9 % IV SOLN
10.0000 mg | Freq: Once | INTRAVENOUS | Status: AC
  Administered 2020-08-23: 10 mg via INTRAVENOUS
  Filled 2020-08-23: qty 1

## 2020-08-23 MED ORDER — DEXTROSE 5 % IV SOLN
Freq: Once | INTRAVENOUS | Status: AC
  Filled 2020-08-23: qty 250

## 2020-08-23 MED ORDER — LEUCOVORIN CALCIUM INJECTION 350 MG
400.0000 mg/m2 | Freq: Once | INTRAMUSCULAR | Status: AC
  Administered 2020-08-23: 768 mg via INTRAVENOUS
  Filled 2020-08-23: qty 38.4

## 2020-08-23 MED ORDER — ATROPINE SULFATE 1 MG/ML IJ SOLN
0.5000 mg | Freq: Once | INTRAMUSCULAR | Status: DC | PRN
  Filled 2020-08-23: qty 1

## 2020-08-23 MED ORDER — SODIUM CHLORIDE 0.9 % IV SOLN
150.0000 mg/m2 | Freq: Once | INTRAVENOUS | Status: AC
  Administered 2020-08-23: 280 mg via INTRAVENOUS
  Filled 2020-08-23: qty 14

## 2020-08-23 MED ORDER — SODIUM CHLORIDE 0.9 % IV SOLN
2000.0000 mg/m2 | INTRAVENOUS | Status: DC
  Administered 2020-08-23: 3850 mg via INTRAVENOUS
  Filled 2020-08-23: qty 77

## 2020-08-23 MED ORDER — OXALIPLATIN CHEMO INJECTION 100 MG/20ML
50.0000 mg/m2 | Freq: Once | INTRAVENOUS | Status: AC
  Administered 2020-08-23: 95 mg via INTRAVENOUS
  Filled 2020-08-23: qty 19

## 2020-08-23 MED ORDER — LORAZEPAM 0.5 MG PO TABS: 0.5000 mg | ORAL_TABLET | Freq: Three times a day (TID) | ORAL | 0 refills | Status: DC | PRN

## 2020-08-23 NOTE — Progress Notes (Signed)
Per Lattie Haw, NP: Faythe Ghee to treat with Plt 60,000

## 2020-08-23 NOTE — Progress Notes (Signed)
[facility] OFFICE PROGRESS NOTE   Diagnosis: Pancreas cancer  INTERVAL HISTORY:   Mr. Surprenant returns as scheduled.  He completed cycle 7 FOLFIRINOX 08/09/2020.  He denies nausea/vomiting.  No mouth sores.  No diarrhea.  Cold sensitivity last 3 to 4 days.  No persistent neuropathy symptoms.  He has a good appetite.  Blood sugars have been well controlled.  No bleeding.  Objective:  Vital signs in last 24 hours:  Blood pressure 103/90, pulse 81, temperature 97.8 F (36.6 C), temperature source Oral, resp. rate 18, height '5\' 8"'$  (1.727 m), weight 174 lb 3.2 oz (79 kg), SpO2 100 %.    HEENT: No thrush or ulcers. Resp: Lungs clear bilaterally. Cardio: Regular rate and rhythm. GI: Abdomen soft and nontender.  No hepatomegaly. Vascular: No leg edema. Neuro: Vibratory sense mildly decreased over the fingertips per tuning fork exam. Skin: Palms without erythema. Port-A-Cath without erythema.   Lab Results:  Lab Results  Component Value Date   WBC 8.6 08/23/2020   HGB 12.8 (L) 08/23/2020   HCT 38.4 (L) 08/23/2020   MCV 92.3 08/23/2020   PLT 60 (L) 08/23/2020   NEUTROABS 5.4 08/23/2020    Imaging:  No results found.  Medications: I have reviewed the patient's current medications.  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  Fat planes around the celiac axis preserved.  Less  than 90 degree contact of the lateral wall of the SMA, no evidence of metastatic disease, 3 mm left lower lobe pulmonary nodule Cycle 1 FOLFOX 05/09/2020 (plan to add irinotecan with cycle 2 pending tolerance) Cycle 2 FOLFIRINOX 05/23/2020 Cycle 3 FOLFIRINOX 06/06/2020, oxaliplatin dose reduced secondary to thrombocytopenia Cycle 4 FOLFIRINOX 06/27/2020, oxaliplatin further dose reduced secondary to thrombocytopenia CTs at [facility] 07/03/2020-redemonstration of a heterogeneous pancreatic head mass with borderline encasement of the SMA as well as abutment of the portal vein, SMV and likely the IVC.  Similar mildly enlarged porta hepatis and pericaval lymph nodes.  No convincing evidence of distant metastatic disease. Cycle 5 FOLFIRINOX 07/12/2020 Cycle 6 FOLFIRINOX 07/26/2020 Cycle 7 FOLFIRINOX 08/08/2020 Cycle 8 FOLFIRINOX 08/23/2020 Obstructive jaundice secondary #1 ERCP 04/11/2020-prominent and floppy major papilla, dilated bile duct secondary to a distal stricture, placement of an uncovered metal stent, bile duct brushings-reactive glandular cells 3.   Diabetes   4.   Hyperlipidemia   5.   Glaucoma   6.   Port-A-Cath placement 05/04/2020 Interventional Radiology   7.   Mild thrombocytopenia (121,000) 05/09/2020      Disposition: Mr. Siemon appears stable.  He has completed 7 cycles of FOLFIRINOX.  Review of the CBC shows progressive moderate thrombocytopenia.  We discussed options to include proceeding as scheduled, dose reducing/eliminating oxaliplatin, holding treatment for 1 week.  He would like to proceed today.  Bleeding precautions reviewed.  He will return for a follow-up CBC in 1 week.  We will see him in follow-up in 3 weeks.  He will contact the office in the interim as outlined above or  with any other problems.  Patient seen with Dr. Benay Spice.    Kalei Leonguerrero ANP/GNP-BC   08/23/2020  8:55 AM This was a shared visit with [name].  Mr Johnstone appears well.  He is scheduled for a final  cycle of neoadjuvant FOLFIRINOX today.  The platelets are lower.  We discussed the risk of more severe thrombocytopenia with continued chemotherapy.  We discussed further dose reduction or elimination of oxaliplatin.  He would like to proceed with oxaliplatin at the current dose.  He will call for bleeding or bruising.  He will return for a CBC next week.  I was present for greater than 50% of today's visit.  I performed medical decision making.  [name], MD

## 2020-08-23 NOTE — Patient Instructions (Signed)
[place]   Discharge Instructions: Thank you for choosing [facility] to provide your oncology and hematology care.   If you have a lab appointment with the [facility], please go directly to the [facility] and check in at the registration area.   Wear comfortable clothing and clothing appropriate for easy access to any Portacath or PICC line.   We strive to give you quality time with your provider. You may need to reschedule your appointment if you arrive late (15 or more minutes).  Arriving late affects you and other patients whose appointments are after yours.  Also, if you miss three or more appointments without notifying the office, you may be dismissed from the clinic at the provider's discretion.      For prescription refill requests, have your pharmacy contact our office and allow 72 hours for refills to be completed.    Today you received the following chemotherapy and/or immunotherapy agents Oxaliplatin (ELOXATIN), Irinotecan (CAMPTOSAR), Leucovorin & Flourouracil (ADRUCIL).      To help prevent nausea and vomiting after your treatment, we encourage you to take your nausea medication as directed.  BELOW ARE SYMPTOMS THAT SHOULD BE REPORTED IMMEDIATELY: *FEVER GREATER THAN 100.4 F (38 C) OR HIGHER *CHILLS OR SWEATING *NAUSEA AND VOMITING THAT IS NOT CONTROLLED WITH YOUR NAUSEA MEDICATION *UNUSUAL SHORTNESS OF BREATH *UNUSUAL BRUISING OR BLEEDING *URINARY PROBLEMS (pain or burning when urinating, or frequent urination) *BOWEL PROBLEMS (unusual diarrhea, constipation, pain near the anus) TENDERNESS IN MOUTH AND THROAT WITH OR WITHOUT PRESENCE OF ULCERS (sore throat, sores in mouth, or a toothache) UNUSUAL RASH, SWELLING OR PAIN  UNUSUAL VAGINAL DISCHARGE OR ITCHING   Items with * indicate a potential emergency and should be followed up as soon as possible or go to the Emergency Department if any problems should occur.  Please  show the CHEMOTHERAPY ALERT CARD or IMMUNOTHERAPY ALERT CARD at check-in to the Emergency Department and triage nurse.  Should you have questions after your visit or need to cancel or reschedule your appointment, please contact [facility]  Dept: [phone]  and follow the prompts.  Office hours are 8:00 a.m. to 4:30 p.m. Monday - Friday. Please note that voicemails left after 4:00 p.m. may not be returned until the following business day.  We are closed weekends and major holidays. You have access to a nurse at all times for urgent questions. Please call the main number to the clinic Dept: [phone] and follow the prompts.   For any non-urgent questions, you may also contact your provider using MyChart. We now offer e-Visits for anyone 108 and older to request care online for non-urgent symptoms. For details visit [email or web address].   Also download the MyChart app! Go to the app store, search "MyChart", open the app, select [facility], and log in with your MyChart username and password.  Due to Covid, a mask is required upon entering the hospital/clinic. If you do not have a mask, one will be given to you upon arrival. For doctor visits, patients may have 1 support person aged 16 or older with them. For treatment visits, patients cannot have anyone with them due to current Covid guidelines and our immunocompromised population.   Oxaliplatin Injection What is this medication? OXALIPLATIN (ox AL i PLA tin) is a chemotherapy drug. It targets fast dividing cells, like cancer cells, and causes these cells to die. This medicine is usedto treat cancers of the colon and rectum,  and many other cancers. This medicine may be used for other purposes; ask your health care provider orpharmacist if you have questions. COMMON BRAND NAME(S): Eloxatin What should I tell my care team before I take this medication? They need to know if you have any of these conditions: heart  disease history of irregular heartbeat liver disease low blood counts, like white cells, platelets, or red blood cells lung or breathing disease, like asthma take medicines that treat or prevent blood clots tingling of the fingers or toes, or other nerve disorder an unusual or allergic reaction to oxaliplatin, other chemotherapy, other medicines, foods, dyes, or preservatives pregnant or trying to get pregnant breast-feeding How should I use this medication? This drug is given as an infusion into a vein. It is administered in a hospitalor clinic by a specially trained health care professional. Talk to your pediatrician regarding the use of this medicine in children.Special care may be needed. Overdosage: If you think you have taken too much of this medicine contact apoison control center or emergency room at once. NOTE: This medicine is only for you. Do not share this medicine with others. What if I miss a dose? It is important not to miss a dose. Call your doctor or health careprofessional if you are unable to keep an appointment. What may interact with this medication? Do not take this medicine with any of the following medications: cisapride dronedarone pimozide thioridazine This medicine may also interact with the following medications: aspirin and aspirin-like medicines certain medicines that treat or prevent blood clots like warfarin, apixaban, dabigatran, and rivaroxaban cisplatin cyclosporine diuretics medicines for infection like acyclovir, adefovir, amphotericin B, bacitracin, cidofovir, foscarnet, ganciclovir, gentamicin, pentamidine, vancomycin NSAIDs, medicines for pain and inflammation, like ibuprofen or naproxen other medicines that prolong the QT interval (an abnormal heart rhythm) pamidronate zoledronic acid This list may not describe all possible interactions. Give your health care provider a list of all the medicines, herbs, non-prescription drugs, or dietary  supplements you use. Also tell them if you smoke, drink alcohol, or use illegaldrugs. Some items may interact with your medicine. What should I watch for while using this medication? Your condition will be monitored carefully while you are receiving thismedicine. You may need blood work done while you are taking this medicine. This medicine may make you feel generally unwell. This is not uncommon as chemotherapy can affect healthy cells as well as cancer cells. Report any side effects. Continue your course of treatment even though you feel ill unless yourhealthcare professional tells you to stop. This medicine can make you more sensitive to cold. Do not drink cold drinks or use ice. Cover exposed skin before coming in contact with cold temperatures or cold objects. When out in cold weather wear warm clothing and cover your mouth and nose to warm the air that goes into your lungs. Tell your doctor if you getsensitive to the cold. Do not become pregnant while taking this medicine or for 9 months after stopping it. Women should inform their health care professional if they wish to become pregnant or think they might be pregnant. Men should not father a child while taking this medicine and for 6 months after stopping it. There is potential for serious side effects to an unborn child. Talk to your health careprofessional for more information. Do not breast-feed a child while taking this medicine or for 3 months afterstopping it. This medicine has caused ovarian failure in some women. This medicine may make it more difficult to  get pregnant. Talk to your health care professional if Ventura Sellers concerned about your fertility. This medicine has caused decreased sperm counts in some men. This may make it more difficult to father a child. Talk to your health care professional if Ventura Sellers concerned about your fertility. This medicine may increase your risk of getting an infection. Call your health care professional for  advice if you get a fever, chills, or sore throat, or other symptoms of a cold or flu. Do not treat yourself. Try to avoid beingaround people who are sick. Avoid taking medicines that contain aspirin, acetaminophen, ibuprofen, naproxen, or ketoprofen unless instructed by your health care professional.These medicines may hide a fever. Be careful brushing or flossing your teeth or using a toothpick because you may get an infection or bleed more easily. If you have any dental work done, Primary school teacher you are receiving this medicine. What side effects may I notice from receiving this medication? Side effects that you should report to your doctor or health care professionalas soon as possible: allergic reactions like skin rash, itching or hives, swelling of the face, lips, or tongue breathing problems cough low blood counts - this medicine may decrease the number of white blood cells, red blood cells, and platelets. You may be at increased risk for infections and bleeding nausea, vomiting pain, redness, or irritation at site where injected pain, tingling, numbness in the hands or feet signs and symptoms of bleeding such as bloody or black, tarry stools; red or dark brown urine; spitting up blood or brown material that looks like coffee grounds; red spots on the skin; unusual bruising or bleeding from the eyes, gums, or nose signs and symptoms of a dangerous change in heartbeat or heart rhythm like chest pain; dizziness; fast, irregular heartbeat; palpitations; feeling faint or lightheaded; falls signs and symptoms of infection like fever; chills; cough; sore throat; pain or trouble passing urine signs and symptoms of liver injury like dark yellow or brown urine; general ill feeling or flu-like symptoms; light-colored stools; loss of appetite; nausea; right upper belly pain; unusually weak or tired; yellowing of the eyes or skin signs and symptoms of low red blood cells or anemia such as unusually weak  or tired; feeling faint or lightheaded; falls signs and symptoms of muscle injury like dark urine; trouble passing urine or change in the amount of urine; unusually weak or tired; muscle pain; back pain Side effects that usually do not require medical attention (report to yourdoctor or health care professional if they continue or are bothersome): changes in taste diarrhea gas hair loss loss of appetite mouth sores This list may not describe all possible side effects. Call your doctor for medical advice about side effects. You may report side effects to FDA at1-800-FDA-1088. Where should I keep my medication? This drug is given in a hospital or clinic and will not be stored at home. NOTE: This sheet is a summary. It may not cover all possible information. If you have questions about this medicine, talk to your doctor, pharmacist, orhealth care provider.  2022 Elsevier/Gold Standard (2018-06-02 12:20:35)  Irinotecan injection What is this medication? IRINOTECAN (ir in oh TEE kan ) is a chemotherapy drug. It is used to treatcolon and rectal cancer. This medicine may be used for other purposes; ask your health care provider orpharmacist if you have questions. COMMON BRAND NAME(S): Camptosar What should I tell my care team before I take this medication? They need to know if you have any of these conditions:  dehydration diarrhea infection (especially a virus infection such as chickenpox, cold sores, or herpes) liver disease low blood counts, like low white cell, platelet, or red cell counts low levels of calcium, magnesium, or potassium in the blood recent or ongoing radiation therapy an unusual or allergic reaction to irinotecan, other medicines, foods, dyes, or preservatives pregnant or trying to get pregnant breast-feeding How should I use this medication? This drug is given as an infusion into a vein. It is administered in a hospitalor clinic by a specially trained health care  professional. Talk to your pediatrician regarding the use of this medicine in children.Special care may be needed. Overdosage: If you think you have taken too much of this medicine contact apoison control center or emergency room at once. NOTE: This medicine is only for you. Do not share this medicine with others. What if I miss a dose? It is important not to miss your dose. Call your doctor or health careprofessional if you are unable to keep an appointment. What may interact with this medication? Do not take this medicine with any of the following medications: cobicistat itraconazole This medicine may interact with the following medications: antiviral medicines for HIV or AIDS certain antibiotics like rifampin or rifabutin certain medicines for fungal infections like ketoconazole, posaconazole, and voriconazole certain medicines for seizures like carbamazepine, phenobarbital, phenotoin clarithromycin gemfibrozil nefazodone [facility]. John's Wort This list may not describe all possible interactions. Give your health care provider a list of all the medicines, herbs, non-prescription drugs, or dietary supplements you use. Also tell them if you smoke, drink alcohol, or use illegaldrugs. Some items may interact with your medicine. What should I watch for while using this medication? Your condition will be monitored carefully while you are receiving this medicine. You will need important blood work done while you are taking thismedicine. This drug may make you feel generally unwell. This is not uncommon, as chemotherapy can affect healthy cells as well as cancer cells. Report any side effects. Continue your course of treatment even though you feel ill unless yourdoctor tells you to stop. In some cases, you may be given additional medicines to help with side effects.Follow all directions for their use. You may get drowsy or dizzy. Do not drive, use machinery, or do anything that needs mental alertness  until you know how this medicine affects you. Do not stand or sit up quickly, especially if you are an older patient. This reducesthe risk of dizzy or fainting spells. Call your health care professional for advice if you get a fever, chills, or sore throat, or other symptoms of a cold or flu. Do not treat yourself. This medicine decreases your body's ability to fight infections. Try to avoid beingaround people who are sick. Avoid taking products that contain aspirin, acetaminophen, ibuprofen, naproxen, or ketoprofen unless instructed by your doctor. These medicines may hide afever. This medicine may increase your risk to bruise or bleed. Call your doctor orhealth care professional if you notice any unusual bleeding. Be careful brushing and flossing your teeth or using a toothpick because you may get an infection or bleed more easily. If you have any dental work done,tell your dentist you are receiving this medicine. Do not become pregnant while taking this medicine or for 6 months after stopping it. Women should inform their health care professional if they wish to become pregnant or think they might be pregnant. Men should not father a child while taking this medicine and for 3 months after stopping it. There  is potential for serious side effects to an unborn child. Talk to your health careprofessional for more information. Do not breast-feed an infant while taking this medicine or for 7 days afterstopping it. This medicine has caused ovarian failure in some women. This medicine may make it more difficult to get pregnant. Talk to your health care professional if Ventura Sellers concerned about your fertility. This medicine has caused decreased sperm counts in some men. This may make it more difficult to father a child. Talk to your health care professional if Ventura Sellers concerned about your fertility. What side effects may I notice from receiving this medication? Side effects that you should report to your doctor or  health care professionalas soon as possible: allergic reactions like skin rash, itching or hives, swelling of the face, lips, or tongue chest pain diarrhea flushing, runny nose, sweating during infusion low blood counts - this medicine may decrease the number of white blood cells, red blood cells and platelets. You may be at increased risk for infections and bleeding. nausea, vomiting pain, swelling, warmth in the leg signs of decreased platelets or bleeding - bruising, pinpoint red spots on the skin, black, tarry stools, blood in the urine signs of infection - fever or chills, cough, sore throat, pain or difficulty passing urine signs of decreased red blood cells - unusually weak or tired, fainting spells, lightheadedness Side effects that usually do not require medical attention (report to yourdoctor or health care professional if they continue or are bothersome): constipation hair loss headache loss of appetite mouth sores stomach pain This list may not describe all possible side effects. Call your doctor for medical advice about side effects. You may report side effects to FDA at1-800-FDA-1088. Where should I keep my medication? This drug is given in a hospital or clinic and will not be stored at home. NOTE: This sheet is a summary. It may not cover all possible information. If you have questions about this medicine, talk to your doctor, pharmacist, orhealth care provider.  2022 Elsevier/Gold Standard (2018-12-14 17:46:13)  Leucovorin injection What is this medication? LEUCOVORIN (loo koe VOR in) is used to prevent or treat the harmful effects of some medicines. This medicine is used to treat anemia caused by a low amount of folic acid in the body. It is also used with 5-fluorouracil (5-FU) to treatcolon cancer. This medicine may be used for other purposes; ask your health care provider orpharmacist if you have questions. What should I tell my care team before I take this  medication? They need to know if you have any of these conditions: anemia from low levels of vitamin B-12 in the blood an unusual or allergic reaction to leucovorin, folic acid, other medicines, foods, dyes, or preservatives pregnant or trying to get pregnant breast-feeding How should I use this medication? This medicine is for injection into a muscle or into a vein. It is given by ahealth care professional in a hospital or clinic setting. Talk to your pediatrician regarding the use of this medicine in children.Special care may be needed. Overdosage: If you think you have taken too much of this medicine contact apoison control center or emergency room at once. NOTE: This medicine is only for you. Do not share this medicine with others. What if I miss a dose? This does not apply. What may interact with this medication? capecitabine fluorouracil phenobarbital phenytoin primidone trimethoprim-sulfamethoxazole This list may not describe all possible interactions. Give your health care provider a list of all the medicines, herbs, non-prescription  drugs, or dietary supplements you use. Also tell them if you smoke, drink alcohol, or use illegaldrugs. Some items may interact with your medicine. What should I watch for while using this medication? Your condition will be monitored carefully while you are receiving thismedicine. This medicine may increase the side effects of 5-fluorouracil, 5-FU. Tell your doctor or health care professional if you have diarrhea or mouth sores that donot get better or that get worse. What side effects may I notice from receiving this medication? Side effects that you should report to your doctor or health care professionalas soon as possible: allergic reactions like skin rash, itching or hives, swelling of the face, lips, or tongue breathing problems fever, infection mouth sores unusual bleeding or bruising unusually weak or tired Side effects that usually do not  require medical attention (report to yourdoctor or health care professional if they continue or are bothersome): constipation or diarrhea loss of appetite nausea, vomiting This list may not describe all possible side effects. Call your doctor for medical advice about side effects. You may report side effects to FDA at1-800-FDA-1088. Where should I keep my medication? This drug is given in a hospital or clinic and will not be stored at home. NOTE: This sheet is a summary. It may not cover all possible information. If you have questions about this medicine, talk to your doctor, pharmacist, orhealth care provider.  2022 Elsevier/Gold Standard (2007-07-20 16:50:29)  Fluorouracil, 5-FU injection What is this medication? FLUOROURACIL, 5-FU (flure oh YOOR a sil) is a chemotherapy drug. It slows the growth of cancer cells. This medicine is used to treat many types of cancer like breast cancer, colon or rectal cancer, pancreatic cancer, and stomachcancer. This medicine may be used for other purposes; ask your health care provider orpharmacist if you have questions. COMMON BRAND NAME(S): Adrucil What should I tell my care team before I take this medication? They need to know if you have any of these conditions: blood disorders dihydropyrimidine dehydrogenase (DPD) deficiency infection (especially a virus infection such as chickenpox, cold sores, or herpes) kidney disease liver disease malnourished, poor nutrition recent or ongoing radiation therapy an unusual or allergic reaction to fluorouracil, other chemotherapy, other medicines, foods, dyes, or preservatives pregnant or trying to get pregnant breast-feeding How should I use this medication? This drug is given as an infusion or injection into a vein. It is administeredin a hospital or clinic by a specially trained health care professional. Talk to your pediatrician regarding the use of this medicine in children.Special care may be  needed. Overdosage: If you think you have taken too much of this medicine contact apoison control center or emergency room at once. NOTE: This medicine is only for you. Do not share this medicine with others. What if I miss a dose? It is important not to miss your dose. Call your doctor or health careprofessional if you are unable to keep an appointment. What may interact with this medication? Do not take this medicine with any of the following medications: live virus vaccines This medicine may also interact with the following medications: medicines that treat or prevent blood clots like warfarin, enoxaparin, and dalteparin This list may not describe all possible interactions. Give your health care provider a list of all the medicines, herbs, non-prescription drugs, or dietary supplements you use. Also tell them if you smoke, drink alcohol, or use illegaldrugs. Some items may interact with your medicine. What should I watch for while using this medication? Visit your doctor for checks  on your progress. This drug may make you feel generally unwell. This is not uncommon, as chemotherapy can affect healthy cells as well as cancer cells. Report any side effects. Continue your course oftreatment even though you feel ill unless your doctor tells you to stop. In some cases, you may be given additional medicines to help with side effects.Follow all directions for their use. Call your doctor or health care professional for advice if you get a fever, chills or sore throat, or other symptoms of a cold or flu. Do not treat yourself. This drug decreases your body's ability to fight infections. Try toavoid being around people who are sick. This medicine may increase your risk to bruise or bleed. Call your doctor orhealth care professional if you notice any unusual bleeding. Be careful brushing and flossing your teeth or using a toothpick because you may get an infection or bleed more easily. If you have any dental  work done,tell your dentist you are receiving this medicine. Avoid taking products that contain aspirin, acetaminophen, ibuprofen, naproxen, or ketoprofen unless instructed by your doctor. These medicines may hide afever. Do not become pregnant while taking this medicine. Women should inform their doctor if they wish to become pregnant or think they might be pregnant. There is a potential for serious side effects to an unborn child. Talk to your health care professional or pharmacist for more information. Do not breast-feed aninfant while taking this medicine. Men should inform their doctor if they wish to father a child. This medicinemay lower sperm counts. Do not treat diarrhea with over the counter products. Contact your doctor ifyou have diarrhea that lasts more than 2 days or if it is severe and watery. This medicine can make you more sensitive to the sun. Keep out of the sun. If you cannot avoid being in the sun, wear protective clothing and use sunscreen.Do not use sun lamps or tanning beds/booths. What side effects may I notice from receiving this medication? Side effects that you should report to your doctor or health care professionalas soon as possible: allergic reactions like skin rash, itching or hives, swelling of the face, lips, or tongue low blood counts - this medicine may decrease the number of white blood cells, red blood cells and platelets. You may be at increased risk for infections and bleeding. signs of infection - fever or chills, cough, sore throat, pain or difficulty passing urine signs of decreased platelets or bleeding - bruising, pinpoint red spots on the skin, black, tarry stools, blood in the urine signs of decreased red blood cells - unusually weak or tired, fainting spells, lightheadedness breathing problems changes in vision chest pain mouth sores nausea and vomiting pain, swelling, redness at site where injected pain, tingling, numbness in the hands or  feet redness, swelling, or sores on hands or feet stomach pain unusual bleeding Side effects that usually do not require medical attention (report to yourdoctor or health care professional if they continue or are bothersome): changes in finger or toe nails diarrhea dry or itchy skin hair loss headache loss of appetite sensitivity of eyes to the light stomach upset unusually teary eyes This list may not describe all possible side effects. Call your doctor for medical advice about side effects. You may report side effects to FDA at1-800-FDA-1088. Where should I keep my medication? This drug is given in a hospital or clinic and will not be stored at home. NOTE: This sheet is a summary. It may not cover all possible information. If  you have questions about this medicine, talk to your doctor, pharmacist, orhealth care provider.  2022 Elsevier/Gold Standard (2018-12-14 15:00:03)

## 2020-08-23 NOTE — Patient Instructions (Signed)
Implanted Port Home Guide An implanted port is a device that is placed under the skin. It is usually placed in the chest. The device can be used to give IV medicine, to take blood, or for dialysis. You may have an implanted port if: You need IV medicine that would be irritating to the small veins in your hands or arms. You need IV medicines, such as antibiotics, for a long period of time. You need IV nutrition for a long period of time. You need dialysis. When you have a port, your health care provider can choose to use the port instead of veins in your arms for these procedures. You may have fewer limitations when using a port than you would if you used other types of long-term IVs, and you will likely be able to return to normal activities afteryour incision heals. An implanted port has two main parts: Reservoir. The reservoir is the part where a needle is inserted to give medicines or draw blood. The reservoir is round. After it is placed, it appears as a small, raised area under your skin. Catheter. The catheter is a thin, flexible tube that connects the reservoir to a vein. Medicine that is inserted into the reservoir goes into the catheter and then into the vein. How is my port accessed? To access your port: A numbing cream may be placed on the skin over the port site. Your health care provider will put on a mask and sterile gloves. The skin over your port will be cleaned carefully with a germ-killing soap and allowed to dry. Your health care provider will gently pinch the port and insert a needle into it. Your health care provider will check for a blood return to make sure the port is in the vein and is not clogged. If your port needs to remain accessed to get medicine continuously (constant infusion), your health care provider will place a clear bandage (dressing) over the needle site. The dressing and needle will need to be changed every week, or as told by your health care provider. What  is flushing? Flushing helps keep the port from getting clogged. Follow instructions from your health care provider about how and when to flush the port. Ports are usually flushed with saline solution or a medicine called heparin. The need for flushing will depend on how the port is used: If the port is only used from time to time to give medicines or draw blood, the port may need to be flushed: Before and after medicines have been given. Before and after blood has been drawn. As part of routine maintenance. Flushing may be recommended every 4-6 weeks. If a constant infusion is running, the port may not need to be flushed. Throw away any syringes in a disposal container that is meant for sharp items (sharps container). You can buy a sharps container from a pharmacy, or you can make one by using an empty hard plastic bottle with a cover. How long will my port stay implanted? The port can stay in for as long as your health care provider thinks it is needed. When it is time for the port to come out, a surgery will be done to remove it. The surgery will be similar to the procedure that was done to putthe port in. Follow these instructions at home:  Flush your port as told by your health care provider. If you need an infusion over several days, follow instructions from your health care provider about how to take   care of your port site. Make sure you: Wash your hands with soap and water before you change your dressing. If soap and water are not available, use alcohol-based hand sanitizer. Change your dressing as told by your health care provider. Place any used dressings or infusion bags into a plastic bag. Throw that bag in the trash. Keep the dressing that covers the needle clean and dry. Do not get it wet. Do not use scissors or sharp objects near the tube. Keep the tube clamped, unless it is being used. Check your port site every day for signs of infection. Check for: Redness, swelling, or  pain. Fluid or blood. Pus or a bad smell. Protect the skin around the port site. Avoid wearing bra straps that rub or irritate the site. Protect the skin around your port from seat belts. Place a soft pad over your chest if needed. Bathe or shower as told by your health care provider. The site may get wet as long as you are not actively receiving an infusion. Return to your normal activities as told by your health care provider. Ask your health care provider what activities are safe for you. Carry a medical alert card or wear a medical alert bracelet at all times. This will let health care providers know that you have an implanted port in case of an emergency. Get help right away if: You have redness, swelling, or pain at the port site. You have fluid or blood coming from your port site. You have pus or a bad smell coming from the port site. You have a fever. Summary Implanted ports are usually placed in the chest for long-term IV access. Follow instructions from your health care provider about flushing the port and changing bandages (dressings). Take care of the area around your port by avoiding clothing that puts pressure on the area, and by watching for signs of infection. Protect the skin around your port from seat belts. Place a soft pad over your chest if needed. Get help right away if you have a fever or you have redness, swelling, pain, drainage, or a bad smell at the port site. This information is not intended to replace advice given to you by your health care provider. Make sure you discuss any questions you have with your healthcare provider. Document Revised: 05/30/2019 Document Reviewed: 05/30/2019 Elsevier Patient Education  2022 Elsevier Inc.  

## 2020-08-24 ENCOUNTER — Encounter: Payer: Self-pay | Admitting: Oncology

## 2020-08-24 LAB — CANCER ANTIGEN 19-9: CA 19-9: 145 U/mL — ABNORMAL HIGH (ref 0–35)

## 2020-08-25 ENCOUNTER — Other Ambulatory Visit: Payer: Self-pay

## 2020-08-25 ENCOUNTER — Inpatient Hospital Stay: Payer: Medicare HMO

## 2020-08-25 VITALS — BP 109/59 | HR 62 | Temp 98.5°F | Resp 18

## 2020-08-25 DIAGNOSIS — K831 Obstruction of bile duct: Secondary | ICD-10-CM | POA: Diagnosis not present

## 2020-08-25 DIAGNOSIS — C250 Malignant neoplasm of head of pancreas: Secondary | ICD-10-CM | POA: Diagnosis not present

## 2020-08-25 DIAGNOSIS — H409 Unspecified glaucoma: Secondary | ICD-10-CM | POA: Diagnosis not present

## 2020-08-25 DIAGNOSIS — Z5111 Encounter for antineoplastic chemotherapy: Secondary | ICD-10-CM | POA: Diagnosis not present

## 2020-08-25 DIAGNOSIS — E119 Type 2 diabetes mellitus without complications: Secondary | ICD-10-CM | POA: Diagnosis not present

## 2020-08-25 DIAGNOSIS — D696 Thrombocytopenia, unspecified: Secondary | ICD-10-CM | POA: Diagnosis not present

## 2020-08-25 DIAGNOSIS — E785 Hyperlipidemia, unspecified: Secondary | ICD-10-CM | POA: Diagnosis not present

## 2020-08-25 DIAGNOSIS — Z5189 Encounter for other specified aftercare: Secondary | ICD-10-CM | POA: Diagnosis not present

## 2020-08-25 DIAGNOSIS — Z794 Long term (current) use of insulin: Secondary | ICD-10-CM | POA: Diagnosis not present

## 2020-08-25 MED ORDER — HEPARIN SOD (PORK) LOCK FLUSH 100 UNIT/ML IV SOLN
500.0000 [IU] | Freq: Once | INTRAVENOUS | Status: AC | PRN
  Administered 2020-08-25: 500 [IU]
  Filled 2020-08-25: qty 5

## 2020-08-25 MED ORDER — SODIUM CHLORIDE 0.9% FLUSH
10.0000 mL | INTRAVENOUS | Status: DC | PRN
  Administered 2020-08-25: 10 mL
  Filled 2020-08-25: qty 10

## 2020-08-25 MED ORDER — PEGFILGRASTIM-CBQV 6 MG/0.6ML ~~LOC~~ SOSY
6.0000 mg | PREFILLED_SYRINGE | Freq: Once | SUBCUTANEOUS | Status: AC
  Administered 2020-08-25: 6 mg via SUBCUTANEOUS

## 2020-08-30 ENCOUNTER — Inpatient Hospital Stay: Payer: Medicare HMO | Attending: Oncology

## 2020-08-30 ENCOUNTER — Other Ambulatory Visit: Payer: Self-pay

## 2020-08-30 ENCOUNTER — Inpatient Hospital Stay: Payer: Medicare HMO

## 2020-08-30 DIAGNOSIS — H409 Unspecified glaucoma: Secondary | ICD-10-CM | POA: Diagnosis not present

## 2020-08-30 DIAGNOSIS — Z794 Long term (current) use of insulin: Secondary | ICD-10-CM | POA: Diagnosis not present

## 2020-08-30 DIAGNOSIS — E785 Hyperlipidemia, unspecified: Secondary | ICD-10-CM | POA: Insufficient documentation

## 2020-08-30 DIAGNOSIS — D696 Thrombocytopenia, unspecified: Secondary | ICD-10-CM | POA: Diagnosis not present

## 2020-08-30 DIAGNOSIS — E119 Type 2 diabetes mellitus without complications: Secondary | ICD-10-CM | POA: Insufficient documentation

## 2020-08-30 DIAGNOSIS — K831 Obstruction of bile duct: Secondary | ICD-10-CM | POA: Diagnosis not present

## 2020-08-30 DIAGNOSIS — C250 Malignant neoplasm of head of pancreas: Secondary | ICD-10-CM

## 2020-08-30 LAB — CBC WITH DIFFERENTIAL (CANCER CENTER ONLY)
Abs Immature Granulocytes: 0.45 10*3/uL — ABNORMAL HIGH (ref 0.00–0.07)
Basophils Absolute: 0.1 10*3/uL (ref 0.0–0.1)
Basophils Relative: 1 %
Eosinophils Absolute: 0 10*3/uL (ref 0.0–0.5)
Eosinophils Relative: 0 %
HCT: 38.8 % — ABNORMAL LOW (ref 39.0–52.0)
Hemoglobin: 12.7 g/dL — ABNORMAL LOW (ref 13.0–17.0)
Immature Granulocytes: 2 %
Lymphocytes Relative: 18 %
Lymphs Abs: 3.9 10*3/uL (ref 0.7–4.0)
MCH: 30.6 pg (ref 26.0–34.0)
MCHC: 32.7 g/dL (ref 30.0–36.0)
MCV: 93.5 fL (ref 80.0–100.0)
Monocytes Absolute: 2.4 10*3/uL — ABNORMAL HIGH (ref 0.1–1.0)
Monocytes Relative: 11 %
Neutro Abs: 14.4 10*3/uL — ABNORMAL HIGH (ref 1.7–7.7)
Neutrophils Relative %: 68 %
Platelet Count: 69 10*3/uL — ABNORMAL LOW (ref 150–400)
RBC: 4.15 MIL/uL — ABNORMAL LOW (ref 4.22–5.81)
RDW: 17 % — ABNORMAL HIGH (ref 11.5–15.5)
WBC Count: 21.3 10*3/uL — ABNORMAL HIGH (ref 4.0–10.5)
nRBC: 0 % (ref 0.0–0.2)

## 2020-09-04 DIAGNOSIS — C250 Malignant neoplasm of head of pancreas: Secondary | ICD-10-CM | POA: Diagnosis not present

## 2020-09-10 DIAGNOSIS — D696 Thrombocytopenia, unspecified: Secondary | ICD-10-CM | POA: Diagnosis not present

## 2020-09-10 DIAGNOSIS — C250 Malignant neoplasm of head of pancreas: Secondary | ICD-10-CM | POA: Diagnosis not present

## 2020-09-13 ENCOUNTER — Inpatient Hospital Stay: Payer: Medicare HMO

## 2020-09-13 ENCOUNTER — Other Ambulatory Visit: Payer: Self-pay

## 2020-09-13 ENCOUNTER — Inpatient Hospital Stay (HOSPITAL_BASED_OUTPATIENT_CLINIC_OR_DEPARTMENT_OTHER): Payer: Medicare HMO | Admitting: Oncology

## 2020-09-13 VITALS — BP 116/82 | HR 73 | Temp 98.1°F | Resp 18 | Ht 68.0 in | Wt 177.0 lb

## 2020-09-13 DIAGNOSIS — Z794 Long term (current) use of insulin: Secondary | ICD-10-CM | POA: Diagnosis not present

## 2020-09-13 DIAGNOSIS — C259 Malignant neoplasm of pancreas, unspecified: Secondary | ICD-10-CM

## 2020-09-13 DIAGNOSIS — E785 Hyperlipidemia, unspecified: Secondary | ICD-10-CM | POA: Diagnosis not present

## 2020-09-13 DIAGNOSIS — E119 Type 2 diabetes mellitus without complications: Secondary | ICD-10-CM | POA: Diagnosis not present

## 2020-09-13 DIAGNOSIS — C250 Malignant neoplasm of head of pancreas: Secondary | ICD-10-CM | POA: Diagnosis not present

## 2020-09-13 DIAGNOSIS — D696 Thrombocytopenia, unspecified: Secondary | ICD-10-CM | POA: Diagnosis not present

## 2020-09-13 DIAGNOSIS — K831 Obstruction of bile duct: Secondary | ICD-10-CM | POA: Diagnosis not present

## 2020-09-13 DIAGNOSIS — H409 Unspecified glaucoma: Secondary | ICD-10-CM | POA: Diagnosis not present

## 2020-09-13 LAB — CBC WITH DIFFERENTIAL (CANCER CENTER ONLY)
Abs Immature Granulocytes: 0.09 10*3/uL — ABNORMAL HIGH (ref 0.00–0.07)
Basophils Absolute: 0 10*3/uL (ref 0.0–0.1)
Basophils Relative: 0 %
Eosinophils Absolute: 0 10*3/uL (ref 0.0–0.5)
Eosinophils Relative: 0 %
HCT: 41.4 % (ref 39.0–52.0)
Hemoglobin: 13.7 g/dL (ref 13.0–17.0)
Immature Granulocytes: 1 %
Lymphocytes Relative: 38 %
Lymphs Abs: 3.5 10*3/uL (ref 0.7–4.0)
MCH: 31.4 pg (ref 26.0–34.0)
MCHC: 33.1 g/dL (ref 30.0–36.0)
MCV: 94.7 fL (ref 80.0–100.0)
Monocytes Absolute: 1.1 10*3/uL — ABNORMAL HIGH (ref 0.1–1.0)
Monocytes Relative: 12 %
Neutro Abs: 4.4 10*3/uL (ref 1.7–7.7)
Neutrophils Relative %: 49 %
Platelet Count: 83 10*3/uL — ABNORMAL LOW (ref 150–400)
RBC: 4.37 MIL/uL (ref 4.22–5.81)
RDW: 16.5 % — ABNORMAL HIGH (ref 11.5–15.5)
WBC Count: 9.1 10*3/uL (ref 4.0–10.5)
nRBC: 0 % (ref 0.0–0.2)

## 2020-09-13 NOTE — Progress Notes (Signed)
[facility] OFFICE PROGRESS NOTE   Diagnosis: Pancreas cancer  INTERVAL HISTORY:   Nathan Mora completed another cycle of FOLFIRINOX on 08/15/2020.  No nausea/vomiting, diarrhea, or neuropathy symptoms.  He feels well.  He is golfing twice weekly.  He is scheduled for surgery on 10/05/2020.  Objective:  Vital signs in last 24 hours:  Blood pressure 116/82, pulse 73, temperature 98.1 F (36.7 C), temperature source Oral, resp. rate 18, height '5\' 8"'$  (1.727 m), weight 177 lb (80.3 kg), SpO2 98 %.    HEENT: No thrush or ulcers Lymphatics: No cervical, supraclavicular, axillary, or inguinal nodes Resp: Lungs clear bilaterally Cardio: Regular rate and rhythm GI: No mass, nontender, no hepatosplenomegaly Vascular: No leg edema  Skin: Palms without erythema  Portacath/PICC-without erythema  Lab Results:  Lab Results  Component Value Date   WBC 9.1 09/13/2020   HGB 13.7 09/13/2020   HCT 41.4 09/13/2020   MCV 94.7 09/13/2020   PLT 83 (L) 09/13/2020   NEUTROABS 4.4 09/13/2020    CMP  Lab Results  Component Value Date   NA 138 08/23/2020   K 4.3 08/23/2020   CL 104 08/23/2020   CO2 26 08/23/2020   GLUCOSE 243 (H) 08/23/2020   BUN 13 08/23/2020   CREATININE 0.65 08/23/2020   CALCIUM 8.5 (L) 08/23/2020   PROT 6.0 (L) 08/23/2020   ALBUMIN 4.1 08/23/2020   AST 19 08/23/2020   ALT 22 08/23/2020   ALKPHOS 141 (H) 08/23/2020   BILITOT 0.5 08/23/2020   GFRNONAA >60 08/23/2020   GFRAA 99 12/21/2019    Lab Results  Component Value Date   [id number] 145 (H) 08/23/2020    Medications: I have reviewed the patient's current medications.   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  Fat planes around the celiac axis preserved.  Less than 90 degree contact of the lateral wall of the SMA, no evidence of metastatic disease, 3 mm left lower lobe pulmonary nodule Cycle 1 FOLFOX 05/09/2020 (plan to add irinotecan with cycle 2 pending tolerance) Cycle 2 FOLFIRINOX 05/23/2020 Cycle 3 FOLFIRINOX 06/06/2020, oxaliplatin dose reduced secondary to thrombocytopenia Cycle 4 FOLFIRINOX 06/27/2020, oxaliplatin further dose reduced secondary to thrombocytopenia CTs at [facility] 07/03/2020-redemonstration of a heterogeneous pancreatic head mass with borderline encasement of the SMA as well as abutment of the portal vein, SMV and likely the IVC.  Similar mildly enlarged porta hepatis and pericaval lymph nodes.  No convincing evidence of distant metastatic disease. Cycle 5 FOLFIRINOX 07/12/2020 Cycle 6 FOLFIRINOX 07/26/2020 Cycle 7 FOLFIRINOX 08/08/2020 Cycle 8 FOLFIRINOX 08/23/2020 Obstructive jaundice secondary #1 ERCP 04/11/2020-prominent and floppy major papilla, dilated bile duct secondary to a distal stricture, placement of an uncovered metal stent, bile duct brushings-reactive glandular cells 3.   Diabetes   4.   Hyperlipidemia   5.   Glaucoma   6.   Port-A-Cath placement 05/04/2020 Interventional Radiology   7.   Mild thrombocytopenia (121,000) 05/09/2020      Disposition: Nathan Mora has completed 8  cycles of neoadjuvant FOLFIRINOX.  He has tolerated the treatment well.  The platelet count is stable today.  He is scheduled for surgery on 10/05/2020.  He has a preoperative visit on 09/27/2020.  We will plan to see him 2-3 weeks following surgery.  We are available to see him sooner as  needed.  [name], MD  09/13/2020  10:39 AM

## 2020-09-27 DIAGNOSIS — E785 Hyperlipidemia, unspecified: Secondary | ICD-10-CM | POA: Diagnosis not present

## 2020-09-27 DIAGNOSIS — Z01812 Encounter for preprocedural laboratory examination: Secondary | ICD-10-CM | POA: Diagnosis not present

## 2020-09-27 DIAGNOSIS — Z01810 Encounter for preprocedural cardiovascular examination: Secondary | ICD-10-CM | POA: Diagnosis not present

## 2020-09-27 DIAGNOSIS — R69 Illness, unspecified: Secondary | ICD-10-CM | POA: Diagnosis not present

## 2020-09-27 DIAGNOSIS — C250 Malignant neoplasm of head of pancreas: Secondary | ICD-10-CM | POA: Diagnosis not present

## 2020-09-27 DIAGNOSIS — G4700 Insomnia, unspecified: Secondary | ICD-10-CM | POA: Diagnosis not present

## 2020-09-27 DIAGNOSIS — D696 Thrombocytopenia, unspecified: Secondary | ICD-10-CM | POA: Diagnosis not present

## 2020-09-27 DIAGNOSIS — H409 Unspecified glaucoma: Secondary | ICD-10-CM | POA: Diagnosis not present

## 2020-09-27 DIAGNOSIS — E119 Type 2 diabetes mellitus without complications: Secondary | ICD-10-CM | POA: Diagnosis not present

## 2020-09-27 DIAGNOSIS — R350 Frequency of micturition: Secondary | ICD-10-CM | POA: Diagnosis not present

## 2020-09-30 ENCOUNTER — Other Ambulatory Visit: Payer: Self-pay | Admitting: Oncology

## 2020-09-30 DIAGNOSIS — C259 Malignant neoplasm of pancreas, unspecified: Secondary | ICD-10-CM

## 2020-10-02 ENCOUNTER — Ambulatory Visit (INDEPENDENT_AMBULATORY_CARE_PROVIDER_SITE_OTHER)
Admission: RE | Admit: 2020-10-02 | Discharge: 2020-10-02 | Disposition: A | Discharge status: Home / Self Care | Payer: Medicare HMO | Source: Ambulatory Visit | Attending: Gastroenterology | Admitting: Gastroenterology

## 2020-10-02 ENCOUNTER — Other Ambulatory Visit (INDEPENDENT_AMBULATORY_CARE_PROVIDER_SITE_OTHER): Payer: Medicare HMO

## 2020-10-02 ENCOUNTER — Other Ambulatory Visit: Payer: Self-pay

## 2020-10-02 ENCOUNTER — Other Ambulatory Visit: Payer: Self-pay | Admitting: Nurse Practitioner

## 2020-10-02 ENCOUNTER — Telehealth: Payer: Self-pay | Admitting: Gastroenterology

## 2020-10-02 DIAGNOSIS — K831 Obstruction of bile duct: Secondary | ICD-10-CM | POA: Diagnosis not present

## 2020-10-02 DIAGNOSIS — R140 Abdominal distension (gaseous): Secondary | ICD-10-CM

## 2020-10-02 DIAGNOSIS — R112 Nausea with vomiting, unspecified: Secondary | ICD-10-CM

## 2020-10-02 DIAGNOSIS — R109 Unspecified abdominal pain: Secondary | ICD-10-CM

## 2020-10-02 DIAGNOSIS — R197 Diarrhea, unspecified: Secondary | ICD-10-CM | POA: Diagnosis not present

## 2020-10-02 DIAGNOSIS — C259 Malignant neoplasm of pancreas, unspecified: Secondary | ICD-10-CM

## 2020-10-02 DIAGNOSIS — C250 Malignant neoplasm of head of pancreas: Secondary | ICD-10-CM

## 2020-10-02 LAB — CBC WITH DIFFERENTIAL/PLATELET
Basophils Absolute: 0 10*3/uL (ref 0.0–0.1)
Basophils Relative: 0.2 % (ref 0.0–3.0)
Eosinophils Absolute: 0 10*3/uL (ref 0.0–0.7)
Eosinophils Relative: 0.2 % (ref 0.0–5.0)
HCT: 36 % — ABNORMAL LOW (ref 39.0–52.0)
Hemoglobin: 12 g/dL — ABNORMAL LOW (ref 13.0–17.0)
Lymphocytes Relative: 35.7 % (ref 12.0–46.0)
Lymphs Abs: 2.3 10*3/uL (ref 0.7–4.0)
MCHC: 33.3 g/dL (ref 30.0–36.0)
MCV: 96 fl (ref 78.0–100.0)
Monocytes Absolute: 0.7 10*3/uL (ref 0.1–1.0)
Monocytes Relative: 10.6 % (ref 3.0–12.0)
Neutro Abs: 3.4 10*3/uL (ref 1.4–7.7)
Neutrophils Relative %: 53.3 % (ref 43.0–77.0)
Platelets: 116 10*3/uL — ABNORMAL LOW (ref 150.0–400.0)
RBC: 3.75 Mil/uL — ABNORMAL LOW (ref 4.22–5.81)
RDW: 15.7 % — ABNORMAL HIGH (ref 11.5–15.5)
WBC: 6.4 10*3/uL (ref 4.0–10.5)

## 2020-10-02 LAB — PROTIME-INR
INR: 0.9 ratio (ref 0.8–1.0)
Prothrombin Time: 10.5 s (ref 9.6–13.1)

## 2020-10-02 LAB — COMPREHENSIVE METABOLIC PANEL
ALT: 100 U/L — ABNORMAL HIGH (ref 0–53)
AST: 42 U/L — ABNORMAL HIGH (ref 0–37)
Albumin: 3.7 g/dL (ref 3.5–5.2)
Alkaline Phosphatase: 547 U/L — ABNORMAL HIGH (ref 39–117)
BUN: 12 mg/dL (ref 6–23)
CO2: 27 mEq/L (ref 19–32)
Calcium: 9 mg/dL (ref 8.4–10.5)
Chloride: 102 mEq/L (ref 96–112)
Creatinine, Ser: 0.74 mg/dL (ref 0.40–1.50)
GFR: 89.41 mL/min (ref >60.00)
Glucose, Bld: 287 mg/dL — ABNORMAL HIGH (ref 70–99)
Potassium: 4.2 mEq/L (ref 3.5–5.1)
Sodium: 135 mEq/L (ref 135–145)
Total Bilirubin: 1.3 mg/dL — ABNORMAL HIGH (ref 0.2–1.2)
Total Protein: 6.4 g/dL (ref 6.0–8.3)

## 2020-10-02 NOTE — Telephone Encounter (Signed)
Received message that patient was not doing well in the last few days. Called and spoke with patient's wife. She has noticed the patient has had more issues of diarrhea recently as well as symptoms of bloating and discomfort and nausea which were similar to when he had symptoms prior to his stenting. Reviewed some outside laboratories that also show significant increase in alkaline phosphatase as well as transferases. This is concerning as to whether there could be stent dysfunction (unlikely stent has migrated out since it was an uncovered stent) but we need to further define this.  I would like the patient come in for laboratories that are done stat (CBC/CMP/INR). I would like the patient to have a KUB 2 view with stat read. There is a chance the patient may need an ERCP this week if the liver tests have significantly increased. We will wait and see what we can do if necessary. Patient has already reached out to the [facility] surgical team but has not heard from them yet.   Patty, please move forward with this.  They will come into the laboratory and radiology department downstairs after 130. Thanks.  GM

## 2020-10-02 NOTE — Telephone Encounter (Signed)
The orders have been entered as requested.

## 2020-10-05 DIAGNOSIS — Z5309 Procedure and treatment not carried out because of other contraindication: Secondary | ICD-10-CM | POA: Diagnosis not present

## 2020-10-05 DIAGNOSIS — C251 Malignant neoplasm of body of pancreas: Secondary | ICD-10-CM | POA: Diagnosis not present

## 2020-10-05 DIAGNOSIS — Z794 Long term (current) use of insulin: Secondary | ICD-10-CM | POA: Diagnosis not present

## 2020-10-05 DIAGNOSIS — G8918 Other acute postprocedural pain: Secondary | ICD-10-CM | POA: Diagnosis not present

## 2020-10-05 DIAGNOSIS — E1165 Type 2 diabetes mellitus with hyperglycemia: Secondary | ICD-10-CM | POA: Diagnosis not present

## 2020-10-05 DIAGNOSIS — K811 Chronic cholecystitis: Secondary | ICD-10-CM | POA: Diagnosis not present

## 2020-10-05 DIAGNOSIS — K8689 Other specified diseases of pancreas: Secondary | ICD-10-CM | POA: Diagnosis not present

## 2020-10-05 DIAGNOSIS — E1169 Type 2 diabetes mellitus with other specified complication: Secondary | ICD-10-CM | POA: Diagnosis not present

## 2020-10-05 DIAGNOSIS — C786 Secondary malignant neoplasm of retroperitoneum and peritoneum: Secondary | ICD-10-CM | POA: Diagnosis not present

## 2020-10-05 DIAGNOSIS — C250 Malignant neoplasm of head of pancreas: Secondary | ICD-10-CM | POA: Diagnosis not present

## 2020-10-05 DIAGNOSIS — H401130 Primary open-angle glaucoma, bilateral, stage unspecified: Secondary | ICD-10-CM | POA: Diagnosis not present

## 2020-10-06 DIAGNOSIS — G8918 Other acute postprocedural pain: Secondary | ICD-10-CM | POA: Diagnosis not present

## 2020-10-06 DIAGNOSIS — E1165 Type 2 diabetes mellitus with hyperglycemia: Secondary | ICD-10-CM | POA: Diagnosis not present

## 2020-10-06 DIAGNOSIS — C250 Malignant neoplasm of head of pancreas: Secondary | ICD-10-CM | POA: Diagnosis not present

## 2020-10-06 DIAGNOSIS — E1169 Type 2 diabetes mellitus with other specified complication: Secondary | ICD-10-CM | POA: Diagnosis not present

## 2020-10-06 DIAGNOSIS — Z794 Long term (current) use of insulin: Secondary | ICD-10-CM | POA: Diagnosis not present

## 2020-10-06 DIAGNOSIS — K8689 Other specified diseases of pancreas: Secondary | ICD-10-CM | POA: Diagnosis not present

## 2020-10-07 DIAGNOSIS — Z794 Long term (current) use of insulin: Secondary | ICD-10-CM | POA: Diagnosis not present

## 2020-10-07 DIAGNOSIS — E1169 Type 2 diabetes mellitus with other specified complication: Secondary | ICD-10-CM | POA: Diagnosis not present

## 2020-10-07 DIAGNOSIS — C250 Malignant neoplasm of head of pancreas: Secondary | ICD-10-CM | POA: Diagnosis not present

## 2020-10-07 DIAGNOSIS — G8918 Other acute postprocedural pain: Secondary | ICD-10-CM | POA: Diagnosis not present

## 2020-10-07 DIAGNOSIS — K8689 Other specified diseases of pancreas: Secondary | ICD-10-CM | POA: Diagnosis not present

## 2020-10-07 DIAGNOSIS — E1165 Type 2 diabetes mellitus with hyperglycemia: Secondary | ICD-10-CM | POA: Diagnosis not present

## 2020-10-08 NOTE — Telephone Encounter (Signed)
I tried to reach as well.  Unfortunately I was not given any further information in the message that was send to me. I will see if the schedulers may have some information that can help track down "Nathan Mora"

## 2020-10-08 NOTE — Telephone Encounter (Signed)
Patty, I tried to call that number and it did not work. Can you get the updated number. But I think, reading through the chart he could not undergo his Whipple procedure. This is not great news if that is the case. GM

## 2020-10-08 NOTE — Telephone Encounter (Signed)
Mickel Baas from [facility] to advise the patient is needing to proceed with a possible ERCP please call them at [phone]

## 2020-10-08 NOTE — Telephone Encounter (Signed)
Dr Bobbye Riggs with [facility] would like to speak with you regarding the pt ERCP.  Please call at your convenience.

## 2020-10-09 ENCOUNTER — Encounter: Payer: Self-pay | Admitting: Nurse Practitioner

## 2020-10-09 ENCOUNTER — Other Ambulatory Visit: Payer: Self-pay

## 2020-10-09 DIAGNOSIS — C259 Malignant neoplasm of pancreas, unspecified: Secondary | ICD-10-CM

## 2020-10-09 DIAGNOSIS — K831 Obstruction of bile duct: Secondary | ICD-10-CM

## 2020-10-09 NOTE — Telephone Encounter (Signed)
The pt has been scheduled at [facility] on 9/27 at 1230 pm with GM for ERCP

## 2020-10-09 NOTE — Telephone Encounter (Signed)
ERCP scheduled, pt instructed and medications reviewed.  Patient instructions mailed to home.  Patient to call with any questions or concerns.  

## 2020-10-09 NOTE — Telephone Encounter (Signed)
Nathan Mora,  I called and spoke with patient/wife. Unfortunately, disease did not allow for him to be a candidate for a Whipple. He will need a stent cleanout/replacement. Dates will be quite tough but there should be availability according to Endo on 9/27.  It can be scheduled for 1230. For that date, please see if we can get my 11:30 case to be moved to 11:00 and do not allow any cases to be scheduled until 2:00 in the afternoon. That will help to ensure I get this procedure done and back to the Endoscopy center. Thanks. GM

## 2020-10-09 NOTE — Telephone Encounter (Signed)
Left message on machine to call back  

## 2020-10-12 ENCOUNTER — Encounter (HOSPITAL_COMMUNITY): Payer: Self-pay | Admitting: Gastroenterology

## 2020-10-17 ENCOUNTER — Other Ambulatory Visit: Payer: Self-pay

## 2020-10-17 ENCOUNTER — Ambulatory Visit (INDEPENDENT_AMBULATORY_CARE_PROVIDER_SITE_OTHER): Payer: Medicare HMO | Admitting: Family Medicine

## 2020-10-17 ENCOUNTER — Encounter: Payer: Self-pay | Admitting: Family Medicine

## 2020-10-17 VITALS — BP 114/70 | HR 83 | Temp 98.0°F | Resp 16 | Ht 68.0 in | Wt 166.8 lb

## 2020-10-17 DIAGNOSIS — R319 Hematuria, unspecified: Secondary | ICD-10-CM | POA: Diagnosis not present

## 2020-10-17 DIAGNOSIS — R351 Nocturia: Secondary | ICD-10-CM

## 2020-10-17 DIAGNOSIS — C250 Malignant neoplasm of head of pancreas: Secondary | ICD-10-CM

## 2020-10-17 DIAGNOSIS — N390 Urinary tract infection, site not specified: Secondary | ICD-10-CM | POA: Diagnosis not present

## 2020-10-17 LAB — PSA: PSA: 13.29 ng/mL — ABNORMAL HIGH (ref 0.10–4.00)

## 2020-10-17 LAB — POCT URINALYSIS DIP (MANUAL ENTRY)
Bilirubin, UA: NEGATIVE
Glucose, UA: NEGATIVE mg/dL
Ketones, POC UA: NEGATIVE mg/dL
Leukocytes, UA: NEGATIVE
Nitrite, UA: NEGATIVE
Protein Ur, POC: NEGATIVE mg/dL
Spec Grav, UA: 1.025 (ref 1.010–1.025)
Urobilinogen, UA: 0.2 E.U./dL
pH, UA: 5 (ref 5.0–8.0)

## 2020-10-17 MED ORDER — LEVOFLOXACIN 500 MG PO TABS: 500.0000 mg | ORAL_TABLET | Freq: Every day | ORAL | 0 refills | Status: AC

## 2020-10-17 NOTE — Patient Instructions (Addendum)
Recheck in next 10 days, but let me know if there are questions sooner. Keep me updated in next few days.  Return to the clinic or go to the nearest emergency room if any of your symptoms worsen or new symptoms occur.  Urinary Tract Infection, Adult A urinary tract infection (UTI) is an infection of any part of the urinary tract. The urinary tract includes the kidneys, ureters, bladder, and urethra. These organs make, store, and get rid of urine in the body. An upper UTI affects the ureters and kidneys. A lower UTI affects the bladder and urethra. What are the causes? Most urinary tract infections are caused by bacteria in your genital area around your urethra, where urine leaves your body. These bacteria grow and cause inflammation of your urinary tract. What increases the risk? You are more likely to develop this condition if: You have a urinary catheter that stays in place. You are not able to control when you urinate or have a bowel movement (incontinence). You are male and you: Use a spermicide or diaphragm for birth control. Have low estrogen levels. Are pregnant. You have certain genes that increase your risk. You are sexually active. You take antibiotic medicines. You have a condition that causes your flow of urine to slow down, such as: An enlarged prostate, if you are male. Blockage in your urethra. A kidney stone. A nerve condition that affects your bladder control (neurogenic bladder). Not getting enough to drink, or not urinating often. You have certain medical conditions, such as: Diabetes. A weak disease-fighting system (immunesystem). Sickle cell disease. Gout. Spinal cord injury. What are the signs or symptoms? Symptoms of this condition include: Needing to urinate right away (urgency). Frequent urination. This may include small amounts of urine each time you urinate. Pain or burning with urination. Blood in the urine. Urine that smells bad or unusual. Trouble  urinating. Cloudy urine. Vaginal discharge, if you are male. Pain in the abdomen or the lower back. You may also have: Vomiting or a decreased appetite. Confusion. Irritability or tiredness. A fever or chills. Diarrhea. The first symptom in older adults may be confusion. In some cases, they may not have any symptoms until the infection has worsened. How is this diagnosed? This condition is diagnosed based on your medical history and a physical exam. You may also have other tests, including: Urine tests. Blood tests. Tests for STIs (sexually transmitted infections). If you have had more than one UTI, a cystoscopy or imaging studies may be done to determine the cause of the infections. How is this treated? Treatment for this condition includes: Antibiotic medicine. Over-the-counter medicines to treat discomfort. Drinking enough water to stay hydrated. If you have frequent infections or have other conditions such as a kidney stone, you may need to see a health care provider who specializes in the urinary tract (urologist). In rare cases, urinary tract infections can cause sepsis. Sepsis is a life-threatening condition that occurs when the body responds to an infection. Sepsis is treated in the hospital with IV antibiotics, fluids, and other medicines. Follow these instructions at home: Medicines Take over-the-counter and prescription medicines only as told by your health care provider. If you were prescribed an antibiotic medicine, take it as told by your health care provider. Do not stop using the antibiotic even if you start to feel better. General instructions Make sure you: Empty your bladder often and completely. Do not hold urine for long periods of time. Empty your bladder after sex. Wipe from  front to back after urinating or having a bowel movement if you are male. Use each tissue only one time when you wipe. Drink enough fluid to keep your urine pale yellow. Keep all  follow-up visits. This is important. Contact a health care provider if: Your symptoms do not get better after 1-2 days. Your symptoms go away and then return. Get help right away if: You have severe pain in your back or your lower abdomen. You have a fever or chills. You have nausea or vomiting. Summary A urinary tract infection (UTI) is an infection of any part of the urinary tract, which includes the kidneys, ureters, bladder, and urethra. Most urinary tract infections are caused by bacteria in your genital area. Treatment for this condition often includes antibiotic medicines. If you were prescribed an antibiotic medicine, take it as told by your health care provider. Do not stop using the antibiotic even if you start to feel better. Keep all follow-up visits. This is important. This information is not intended to replace advice given to you by your health care provider. Make sure you discuss any questions you have with your health care provider. Document Revised: 08/26/2019 Document Reviewed: 08/26/2019 Elsevier Patient Education  [place].

## 2020-10-17 NOTE — Progress Notes (Signed)
Subjective:  Patient ID: Nathan Mora, male    DOB: Jun 08, 1945  Age: 75 y.o. MRN: [id number]  CC:  Chief Complaint  Patient presents with   Nocturia    Pt reports he has had increased urination reporting 10+ trips to the bathroom last night and the last 3 nights, some lower back pain over the weekend, pt is having urgency with urination, surgery to remove gallbladder 10 days ago and has had sxs apx 1 week     HPI Nathan Mora presents for   Nocturia As above, up to 10 times last night, past 3 nights, some low back pain over the weekend, urinary urgency. Had foley catheter during recent hospitalization for few days out on 9/12. Urinary frequency, urgency more noticeable past 9 days.  No fever. Slight back soreness. Some stomach soreness since surgery, improving every day since surgery. No new abdominal pain.  No hematuria.  Prior to surgery 2-3 episodes nocturia per night.  No retention symptoms.  Tx: none On chronic doxazosin 78m qd.  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  Therefore aborted procedure and plan to complete chemotherapy then evaluate for suitability of radiation therapy.  Cholecystectomy performed at that time. Appt with oncologist, GI, surgeon soon to decide on next steps along with his surgeon Dr. CCarlis Abbott   He does have history of type 2 diabetes with hyperglycemia, started on insulin in April.  Thought to have stress hyperglycemia during recent hospitalization.  Started on basal Lantus insulin 10 units nightly, lispro correction insulin currently taking. Has CGM - home readings have been stable. Under 140 during day. No nighttime insulin needed. Home readings in 60-70 with 5 units basal insulin - has placed on hold. No recent sliding scale insulin needed since hospitalization.   Positive depression screening: Frustrated with change in  plan for pancreatic surgery. Felt depressed for a few days. Feels better now. Here with spouse. She reports some anxiety attacks at times, with shivering, no fever. Vitals ok. He does not thinks he is having anxiety/panic attacks, declines medication or treatment at this time.   [facility] 10/17/2020 03/23/2020 12/21/2019 09/15/2019 09/02/2019  Decreased Interest 0 0 0 0 0  Down, Depressed, Hopeless 1 0 0 0 0  PHQ - 2 Score 1 0 0 0 0  Altered sleeping 3 - - - -  Tired, decreased energy 3 - - - -  Change in appetite 3 - - - -  Feeling bad or failure about yourself  0 - - - -  Trouble concentrating 1 - - - -  Moving slowly or fidgety/restless 0 - - - -  Suicidal thoughts 0 - - - -  PHQ-9 Score 11 - - - -     History Patient Active Problem List   Diagnosis Date Noted   Malignant neoplasm of head of pancreas ([place] 04/26/2020   Excessive urination at night 12/21/2019   Chronic diarrhea 03/31/2019   Dermatochalasis 11/29/2012   Primary open angle glaucoma of both eyes 11/29/2012   Family history of early CAD 04/23/2012   Hyperlipidemia    Glaucoma    Lipid disorder 03/29/2012   Past Medical History:  Diagnosis Date   [place] ([place]    Diabetes mellitus without complication ([place]    Glaucoma    left eye   Hyperlipidemia    Lipid disorder 03/29/2012  Past Surgical History:  Procedure Laterality Date   BILIARY BRUSHING  04/11/2020   Procedure: BILIARY BRUSHING;  Surgeon: Rush Landmark Telford Nab., MD;  Location: [facility];  Service: Gastroenterology;;   BILIARY STENT PLACEMENT N/A 04/11/2020   Procedure: BILIARY STENT PLACEMENT;  Surgeon: Irving Copas., MD;  Location: [facility];  Service: Gastroenterology;  Laterality: N/A;   BIOPSY  04/11/2020   Procedure: BIOPSY;  Surgeon: Rush Landmark Telford Nab., MD;  Location: [facility];  Service: Gastroenterology;;   ENDOSCOPIC RETROGRADE CHOLANGIOPANCREATOGRAPHY (ERCP) WITH PROPOFOL N/A 04/11/2020   Procedure:  ENDOSCOPIC RETROGRADE CHOLANGIOPANCREATOGRAPHY (ERCP) WITH PROPOFOL;  Surgeon: Irving Copas., MD;  Location: [facility];  Service: Gastroenterology;  Laterality: N/A;   ESOPHAGOGASTRODUODENOSCOPY (EGD) WITH PROPOFOL N/A 04/11/2020   Procedure: ESOPHAGOGASTRODUODENOSCOPY (EGD) WITH PROPOFOL;  Surgeon: Rush Landmark Telford Nab., MD;  Location: [facility];  Service: Gastroenterology;  Laterality: N/A;   EUS N/A 04/11/2020   Procedure: UPPER ENDOSCOPIC ULTRASOUND (EUS) RADIAL;  Surgeon: Irving Copas., MD;  Location: [facility];  Service: Gastroenterology;  Laterality: N/A;   FINE NEEDLE ASPIRATION  04/11/2020   Procedure: FINE NEEDLE ASPIRATION (FNA) LINEAR;  Surgeon: Irving Copas., MD;  Location: [facility];  Service: Gastroenterology;;   IR IMAGING GUIDED PORT INSERTION  05/04/2020   PANCREATIC STENT PLACEMENT  04/11/2020   Procedure: PANCREATIC STENT PLACEMENT;  Surgeon: Irving Copas., MD;  Location: [facility];  Service: Gastroenterology;;   [name]  04/11/2020   Procedure: [name];  Surgeon: Mansouraty, Telford Nab., MD;  Location: [facility];  Service: Gastroenterology;;   No Known Allergies Prior to Admission medications   Medication Sig Start Date End Date Taking? Authorizing Provider  acetaminophen (TYLENOL) 500 MG tablet Take 1,000 mg by mouth every 8 (eight) hours as needed for mild pain.   Yes [provider]  atorvastatin (LIPITOR) 40 MG tablet Take 0.5 tablets (20 mg total) by mouth daily at 6 PM. 10/20/19  Yes [name], MD  BD PEN NEEDLE NANO 2ND GEN 32G X 4 MM MISC  08/16/20  Yes [provider]  blood glucose meter kit and supplies Test once per day - fasting or 2 hours after meal. Dispense based on patient and insurance preference. 04/15/20  Yes [name], MD  brimonidine-timolol (COMBIGAN) 0.2-0.5 % ophthalmic solution Place 1 drop into the left eye every 12 (twelve) hours.   Yes [provider]  Continuous Blood Gluc Sensor (FREESTYLE LIBRE 2 SENSOR) MISC Inject 1 sensor to the skin every 14 days for continuous glucose monitoring. 05/23/20  Yes [provider]  CREON 36000-114000 units CPEP capsule TAKE ONE CAPSULE BY MOUTH WITH  MEALS  AND WITH SNACKS 10/02/20  Yes [name], MD  doxazosin (CARDURA) 2 MG tablet Take 1 tablet (2 mg total) by mouth daily. 10/20/19  Yes [name], MD  enoxaparin (LOVENOX) 40 MG/0.4ML injection Inject 40 mg into the skin daily. 10/09/20  Yes [provider]  gabapentin (NEURONTIN) 800 MG tablet Take 400 mg by mouth at bedtime as needed (pain).   Yes [provider]  ibuprofen (ADVIL) 200 MG tablet Take 400 mg by mouth every evening.   Yes [provider]  insulin aspart (NOVOLOG) 100 UNIT/ML FlexPen Inject 0-6 Units into the skin 3 (three) times daily with meals. Dose per sliding scale, 150-200= 2 units, 201-250= 4 units, 251-300= 6 units 05/08/20  Yes [provider]  Insulin Glargine (BASAGLAR KWIKPEN) 100 UNIT/ML Inject 9 Units into the skin at bedtime. 05/22/20  Yes [provider]  Lancets (ONETOUCH DELICA PLUS [id number]) MISC TEST ONCE A DAY - FASTING OR 2 HOURS AFTER MEAL 07/13/20  Yes [name], MD  latanoprost (XALATAN) 0.005 % ophthalmic solution Place 1 drop into the left eye at bedtime.   Yes [provider]  lidocaine-prilocaine (EMLA) cream Apply 1 application topically as needed. 05/07/20  Yes [name], MD  LORazepam (ATIVAN) 0.5 MG tablet TAKE ONE TABLET BY MOUTH EVERY 8 HOURS AS NEEDED FOR ANXIETY OR NAUSEA 10/02/20  Yes Owens Shark, NP  omeprazole (PRILOSEC) 20 MG capsule TAKE TWO CAPSULES BY MOUTH DAILY 07/23/20  Yes Mansouraty, Telford Nab., MD  ondansetron (ZOFRAN) 8 MG tablet Take 1 tablet (8 mg total) by mouth every 8 (eight) hours as needed for nausea or vomiting. Do not begin until 72 hours after day 1 chemotherapy 05/11/20  Yes Owens Shark, NP  [facility] ULTRA test strip TEST EVERY DAY FASTING OR 2 HOURS AFTER A MEAL 12/13/19  Yes [name], MD  prochlorperazine (COMPAZINE) 10 MG tablet Take 1 tablet (10 mg total) by mouth every 6 (six) hours as needed for nausea or vomiting. 07/12/20  Yes [name], MD  sildenafil (REVATIO) 20 MG tablet 1 to 3 tabs up to QD prior to onset of sexual activity. Patient taking differently: Take 20-60 mg by mouth daily as needed (erectile dysfunction). 04/08/19  Yes [name], MD   Social History   Socioeconomic History   Marital status: Married    Spouse name: Not on file   Number of children: Not on file   Years of education: Not on file   Highest education level: Not on file  Occupational History   Not on file  Tobacco Use   Smoking status: Never   Smokeless tobacco: Never  Substance and Sexual Activity   Alcohol use: Yes    Alcohol/week: 4.0 standard drinks    Types: 4 Cans of beer per week   Drug use: No   Sexual activity: Yes  Other Topics Concern   Not on file  Social History Narrative   Not on file   Social Determinants of Health   Financial Resource Strain: Not on file  Food Insecurity: Not on file  Transportation Needs: Not on file  Physical Activity: Not on file  Stress: Not on file  Social Connections: Not on file  Intimate Partner Violence: Not on file    Review of Systems Per HPI.   Objective:   Vitals:   10/17/20 1113  BP: 114/70  Pulse: 83  Resp: 16  Temp: 98 F (36.7 C)  TempSrc: Temporal  SpO2: 98%  Weight: 166 lb 12.8 oz (75.7 kg)  Height: '5\' 8"'  (1.727 m)     Physical Exam  Results for orders placed or performed in visit on 10/17/20  POCT urinalysis dipstick  Result Value Ref Range   Color, UA straw (A) yellow   Clarity, UA clear clear   Glucose, UA negative negative mg/dL   Bilirubin, UA negative negative   Ketones, POC UA negative negative mg/dL   Spec Grav, UA 1.025 1.010 - 1.025   Blood, UA moderate (A)  negative   pH, UA 5.0 5.0 - 8.0   Protein Ur, POC negative negative mg/dL   Urobilinogen, UA 0.2 0.2 or 1.0 E.U./dL   Nitrite, UA Negative Negative   Leukocytes, UA Negative Negative     Assessment & Plan:  Nathan Mora is a 75  y.o. male . Nocturia - Plan: POCT urinalysis dipstick, Urine Culture, PSA, CANCELED: POCT CBC  Hematuria, unspecified type - Plan: Urine Culture, PSA, CANCELED: POCT CBC  Urinary tract infection with hematuria, site unspecified - Plan: CBC, levofloxacin (LEVAQUIN) 500 MG tablet  Malignant neoplasm of head of pancreas ([place])  Possible prostatitis based on symptoms, with hematuria.  Potentially related to prior Foley catheterization.  Afebrile, outpatient treatment initially with Levaquin, check urine culture and PSA as well as CBC.  Potential side effects and risks including tendinopathy risks discussed with quinolone antibiotic, understanding expressed.  Update on symptoms next few days, RTC/ER precautions given.  Unfortunately was not able to have Whipple procedure performed, has follow-up with his specialists in the next week to review further treatment plans.  Discussed understandable frustration/disappointment with this setback, but he denies depression/anxiety at this time.  RTC precautions discussed if persistent anhedonia, depressed mood, anxiety, difficulty with sleep, or possible panic or anxiety attacks as discussed above.  Understanding expressed.  Meds ordered this encounter  Medications   levofloxacin (LEVAQUIN) 500 MG tablet    Sig: Take 1 tablet (500 mg total) by mouth daily for 7 days.    Dispense:  10 tablet    Refill:  0   Patient Instructions  Recheck in next 10 days, but let me know if there are questions sooner. Keep me updated in next few days.  Return to the clinic or go to the nearest emergency room if any of your symptoms worsen or new symptoms occur.  Urinary Tract Infection, Adult A urinary tract infection (UTI) is an infection of  any part of the urinary tract. The urinary tract includes the kidneys, ureters, bladder, and urethra. These organs make, store, and get rid of urine in the body. An upper UTI affects the ureters and kidneys. A lower UTI affects the bladder and urethra. What are the causes? Most urinary tract infections are caused by bacteria in your genital area around your urethra, where urine leaves your body. These bacteria grow and cause inflammation of your urinary tract. What increases the risk? You are more likely to develop this condition if: You have a urinary catheter that stays in place. You are not able to control when you urinate or have a bowel movement (incontinence). You are male and you: Use a spermicide or diaphragm for birth control. Have low estrogen levels. Are pregnant. You have certain genes that increase your risk. You are sexually active. You take antibiotic medicines. You have a condition that causes your flow of urine to slow down, such as: An enlarged prostate, if you are male. Blockage in your urethra. A kidney stone. A nerve condition that affects your bladder control (neurogenic bladder). Not getting enough to drink, or not urinating often. You have certain medical conditions, such as: Diabetes. A weak disease-fighting system (immunesystem). Sickle cell disease. Gout. Spinal cord injury. What are the signs or symptoms? Symptoms of this condition include: Needing to urinate right away (urgency). Frequent urination. This may include small amounts of urine each time you urinate. Pain or burning with urination. Blood in the urine. Urine that smells bad or unusual. Trouble urinating. Cloudy urine. Vaginal discharge, if you are male. Pain in the abdomen or the lower back. You may also have: Vomiting or a decreased appetite. Confusion. Irritability or tiredness. A fever or chills. Diarrhea. The first symptom in older adults may be confusion. In some cases, they  may not have any symptoms until the infection has worsened. How  is this diagnosed? This condition is diagnosed based on your medical history and a physical exam. You may also have other tests, including: Urine tests. Blood tests. Tests for STIs (sexually transmitted infections). If you have had more than one UTI, a cystoscopy or imaging studies may be done to determine the cause of the infections. How is this treated? Treatment for this condition includes: Antibiotic medicine. Over-the-counter medicines to treat discomfort. Drinking enough water to stay hydrated. If you have frequent infections or have other conditions such as a kidney stone, you may need to see a health care provider who specializes in the urinary tract (urologist). In rare cases, urinary tract infections can cause sepsis. Sepsis is a life-threatening condition that occurs when the body responds to an infection. Sepsis is treated in the hospital with IV antibiotics, fluids, and other medicines. Follow these instructions at home: Medicines Take over-the-counter and prescription medicines only as told by your health care provider. If you were prescribed an antibiotic medicine, take it as told by your health care provider. Do not stop using the antibiotic even if you start to feel better. General instructions Make sure you: Empty your bladder often and completely. Do not hold urine for long periods of time. Empty your bladder after sex. Wipe from front to back after urinating or having a bowel movement if you are male. Use each tissue only one time when you wipe. Drink enough fluid to keep your urine pale yellow. Keep all follow-up visits. This is important. Contact a health care provider if: Your symptoms do not get better after 1-2 days. Your symptoms go away and then return. Get help right away if: You have severe pain in your back or your lower abdomen. You have a fever or chills. You have nausea or  vomiting. Summary A urinary tract infection (UTI) is an infection of any part of the urinary tract, which includes the kidneys, ureters, bladder, and urethra. Most urinary tract infections are caused by bacteria in your genital area. Treatment for this condition often includes antibiotic medicines. If you were prescribed an antibiotic medicine, take it as told by your health care provider. Do not stop using the antibiotic even if you start to feel better. Keep all follow-up visits. This is important. This information is not intended to replace advice given to you by your health care provider. Make sure you discuss any questions you have with your health care provider. Document Revised: 08/26/2019 Document Reviewed: 08/26/2019 Elsevier Patient Education  2022 [place],   [name], MD [place] 10/17/20 12:35 PM

## 2020-10-18 ENCOUNTER — Telehealth: Payer: Self-pay

## 2020-10-18 ENCOUNTER — Encounter: Payer: Self-pay | Admitting: Family Medicine

## 2020-10-18 LAB — CBC
HCT: 34 % — ABNORMAL LOW (ref 39.0–52.0)
Hemoglobin: 11.6 g/dL — ABNORMAL LOW (ref 13.0–17.0)
MCHC: 34 g/dL (ref 30.0–36.0)
MCV: 94.1 fl (ref 78.0–100.0)
Platelets: 201 10*3/uL (ref 150.0–400.0)
RBC: 3.62 Mil/uL — ABNORMAL LOW (ref 4.22–5.81)
RDW: 14.8 % (ref 11.5–15.5)
WBC: 9.5 10*3/uL (ref 4.0–10.5)

## 2020-10-18 NOTE — Telephone Encounter (Signed)
Patient's wife called in stating that he is feeling much better since starting the antibiotic, but did have another panic attack once they got home from their recent visit. States that she will contact Dr. Bethena Roys office.

## 2020-10-18 NOTE — Telephone Encounter (Signed)
Discussed by lab result note.

## 2020-10-19 ENCOUNTER — Other Ambulatory Visit: Payer: Self-pay | Admitting: Registered Nurse

## 2020-10-19 ENCOUNTER — Encounter: Payer: Self-pay | Admitting: Nurse Practitioner

## 2020-10-19 ENCOUNTER — Telehealth: Payer: Self-pay

## 2020-10-19 DIAGNOSIS — C250 Malignant neoplasm of head of pancreas: Secondary | ICD-10-CM

## 2020-10-19 LAB — URINE CULTURE
MICRO NUMBER:: 12404538
SPECIMEN QUALITY:: ADEQUATE

## 2020-10-19 MED ORDER — LORAZEPAM 0.5 MG PO TABS: ORAL_TABLET | ORAL | 0 refills | Status: DC

## 2020-10-19 NOTE — Telephone Encounter (Signed)
Called cell phone - no answer. Called patient, spouse at alternate number and spoke with both on speaker.  Having more trouble sleeping, that turned into more anxiety.  Anxious and stressed about his current situation. Initially took 0.5mg  ativan, tried another about 6 hours later, then 1mg  about 5am - finally able to sleep.  slept from 5am -11:30 today.  Had a good day today, spent some time outside. Some decreased appetite, sugars ok. Situational anxiety/adjustment disorder likely with recent bad news regarding his disease and unable to have Whipple procedure.  Has Ativan 0.5 mg with refill sent today.  Likely just needs higher dose Ativan temporarily.  Recommended 1 mg dose initially tonight followed by additional 0.5 mg in 30 to 45 minutes if no relief, followed by additional 0.5 mg in 30 to 45 minutes if no relief.  Also gave option to take Ativan low-dose during the day if needed for anxiety flares, and to update me on his symptoms by the first part of the week, or this weekend if he is having more difficulty.  Would consider counseling, potentially other medication changes depending on how he is doing next week.  He does have an appointment Monday and Thursday with his specialists next week.  All questions were answered and patient, spouse in agreement with plan.

## 2020-10-19 NOTE — Telephone Encounter (Signed)
Error

## 2020-10-19 NOTE — Telephone Encounter (Signed)
See notes on mychart message.

## 2020-10-19 NOTE — Telephone Encounter (Signed)
Dr [name] Pt wife called in and I am forwarding you the message that his oncologist has requested

## 2020-10-19 NOTE — Telephone Encounter (Signed)
Patient's wife called and states that Ativan is not working for the patient. Please refer to oncologist MyChart message from earlier today. She would like to talk to Dr. [name] about her husband starting on anxiety medication. Patient's wife was advised that Dr. [name] was currently in a meeting and not working this afternoon but we would contact her once we received an answer. I let her know that Maximiano Coss, NP sent a refill for ativan to pharmacy. She states she would wait to hear back from Dr. [name]. She is available this afternoon by phone and MyChart. Please advise.

## 2020-10-19 NOTE — Progress Notes (Signed)
Pt run out of lorazepam PRN Cannot sleep Will provide courtesy fill as he cannot reach provider at this time   Kathrin Ruddy, NP

## 2020-10-20 ENCOUNTER — Other Ambulatory Visit: Payer: Self-pay | Admitting: Gastroenterology

## 2020-10-22 DIAGNOSIS — K8681 Exocrine pancreatic insufficiency: Secondary | ICD-10-CM | POA: Diagnosis not present

## 2020-10-22 DIAGNOSIS — Z483 Aftercare following surgery for neoplasm: Secondary | ICD-10-CM | POA: Diagnosis not present

## 2020-10-22 DIAGNOSIS — C250 Malignant neoplasm of head of pancreas: Secondary | ICD-10-CM | POA: Diagnosis not present

## 2020-10-23 ENCOUNTER — Ambulatory Visit (HOSPITAL_COMMUNITY): Payer: Medicare HMO | Admitting: Certified Registered"

## 2020-10-23 ENCOUNTER — Ambulatory Visit (HOSPITAL_COMMUNITY)
Admission: RE | Admit: 2020-10-23 | Discharge: 2020-10-23 | Disposition: A | Discharge status: Home / Self Care | Payer: Medicare HMO | Source: Ambulatory Visit | Attending: Gastroenterology | Admitting: Gastroenterology

## 2020-10-23 ENCOUNTER — Telehealth: Payer: Self-pay | Admitting: Gastroenterology

## 2020-10-23 ENCOUNTER — Encounter (HOSPITAL_COMMUNITY): Payer: Self-pay | Admitting: Gastroenterology

## 2020-10-23 ENCOUNTER — Other Ambulatory Visit: Payer: Self-pay

## 2020-10-23 ENCOUNTER — Encounter (HOSPITAL_COMMUNITY)
Admission: RE | Disposition: A | Discharge status: Home / Self Care | Payer: Self-pay | Source: Ambulatory Visit | Attending: Gastroenterology

## 2020-10-23 DIAGNOSIS — Z9889 Other specified postprocedural states: Secondary | ICD-10-CM

## 2020-10-23 DIAGNOSIS — R945 Abnormal results of liver function studies: Secondary | ICD-10-CM | POA: Diagnosis present

## 2020-10-23 DIAGNOSIS — C259 Malignant neoplasm of pancreas, unspecified: Secondary | ICD-10-CM | POA: Insufficient documentation

## 2020-10-23 DIAGNOSIS — Z4659 Encounter for fitting and adjustment of other gastrointestinal appliance and device: Secondary | ICD-10-CM

## 2020-10-23 DIAGNOSIS — K3189 Other diseases of stomach and duodenum: Secondary | ICD-10-CM | POA: Diagnosis not present

## 2020-10-23 DIAGNOSIS — T85590A Other mechanical complication of bile duct prosthesis, initial encounter: Secondary | ICD-10-CM | POA: Insufficient documentation

## 2020-10-23 DIAGNOSIS — H409 Unspecified glaucoma: Secondary | ICD-10-CM | POA: Diagnosis not present

## 2020-10-23 DIAGNOSIS — K831 Obstruction of bile duct: Secondary | ICD-10-CM | POA: Diagnosis not present

## 2020-10-23 DIAGNOSIS — E119 Type 2 diabetes mellitus without complications: Secondary | ICD-10-CM | POA: Diagnosis not present

## 2020-10-23 DIAGNOSIS — Y831 Surgical operation with implant of artificial internal device as the cause of abnormal reaction of the patient, or of later complication, without mention of misadventure at the time of the procedure: Secondary | ICD-10-CM | POA: Insufficient documentation

## 2020-10-23 HISTORY — PX: REMOVAL OF STONES: SHX5545

## 2020-10-23 HISTORY — PX: ENDOSCOPIC RETROGRADE CHOLANGIOPANCREATOGRAPHY (ERCP) WITH PROPOFOL: SHX5810

## 2020-10-23 HISTORY — PX: BILIARY DILATION: SHX6850

## 2020-10-23 HISTORY — PX: BILIARY STENT PLACEMENT: SHX5538

## 2020-10-23 LAB — GLUCOSE, CAPILLARY
Glucose-Capillary: 125 mg/dL — ABNORMAL HIGH (ref 70–99)
Glucose-Capillary: 177 mg/dL — ABNORMAL HIGH (ref 70–99)

## 2020-10-23 SURGERY — ENDOSCOPIC RETROGRADE CHOLANGIOPANCREATOGRAPHY (ERCP) WITH PROPOFOL
Anesthesia: General

## 2020-10-23 MED ORDER — PHENYLEPHRINE 40 MCG/ML (10ML) SYRINGE FOR IV PUSH (FOR BLOOD PRESSURE SUPPORT)
PREFILLED_SYRINGE | INTRAVENOUS | Status: DC | PRN
  Administered 2020-10-23: 80 ug via INTRAVENOUS
  Administered 2020-10-23 (×3): 40 ug via INTRAVENOUS
  Administered 2020-10-23: 80 ug via INTRAVENOUS
  Administered 2020-10-23: 40 ug via INTRAVENOUS

## 2020-10-23 MED ORDER — SODIUM CHLORIDE 0.9 % IV SOLN: INTRAVENOUS | Status: DC

## 2020-10-23 MED ORDER — ROCURONIUM BROMIDE 10 MG/ML (PF) SYRINGE
PREFILLED_SYRINGE | INTRAVENOUS | Status: DC | PRN
  Administered 2020-10-23 (×2): 10 mg via INTRAVENOUS
  Administered 2020-10-23: 50 mg via INTRAVENOUS
  Administered 2020-10-23: 10 mg via INTRAVENOUS

## 2020-10-23 MED ORDER — INDOMETHACIN 50 MG RE SUPP
RECTAL | Status: DC | PRN
  Administered 2020-10-23: 100 mg via RECTAL

## 2020-10-23 MED ORDER — GLUCAGON HCL RDNA (DIAGNOSTIC) 1 MG IJ SOLR
INTRAMUSCULAR | Status: DC | PRN
  Administered 2020-10-23 (×2): .25 mg via INTRAVENOUS

## 2020-10-23 MED ORDER — CIPROFLOXACIN HCL 500 MG PO TABS: 500.0000 mg | ORAL_TABLET | Freq: Two times a day (BID) | ORAL | 0 refills | Status: AC

## 2020-10-23 MED ORDER — FENTANYL CITRATE (PF) 100 MCG/2ML IJ SOLN
INTRAMUSCULAR | Status: AC
  Filled 2020-10-23: qty 2

## 2020-10-23 MED ORDER — LIDOCAINE 2% (20 MG/ML) 5 ML SYRINGE
INTRAMUSCULAR | Status: DC | PRN
  Administered 2020-10-23: 40 mg via INTRAVENOUS

## 2020-10-23 MED ORDER — FENTANYL CITRATE (PF) 250 MCG/5ML IJ SOLN
INTRAMUSCULAR | Status: DC | PRN
  Administered 2020-10-23: 50 ug via INTRAVENOUS

## 2020-10-23 MED ORDER — PHENYLEPHRINE HCL-NACL 20-0.9 MG/250ML-% IV SOLN
INTRAVENOUS | Status: DC | PRN
  Administered 2020-10-23: 20 ug/min via INTRAVENOUS

## 2020-10-23 MED ORDER — SODIUM CHLORIDE 0.9 % IV SOLN
INTRAVENOUS | Status: DC | PRN
  Administered 2020-10-23: 80 mL

## 2020-10-23 MED ORDER — CIPROFLOXACIN HCL 500 MG PO TABS: 500.0000 mg | ORAL_TABLET | Freq: Two times a day (BID) | ORAL | 0 refills | Status: DC

## 2020-10-23 MED ORDER — CIPROFLOXACIN IN D5W 400 MG/200ML IV SOLN
INTRAVENOUS | Status: DC | PRN
  Administered 2020-10-23: 400 mg via INTRAVENOUS

## 2020-10-23 MED ORDER — INDOMETHACIN 50 MG RE SUPP
RECTAL | Status: AC
  Filled 2020-10-23: qty 2

## 2020-10-23 MED ORDER — LACTATED RINGERS IV SOLN
INTRAVENOUS | Status: DC
  Administered 2020-10-23: 1000 mL via INTRAVENOUS

## 2020-10-23 MED ORDER — DEXAMETHASONE SODIUM PHOSPHATE 10 MG/ML IJ SOLN
INTRAMUSCULAR | Status: DC | PRN
  Administered 2020-10-23: 4 mg via INTRAVENOUS

## 2020-10-23 MED ORDER — ONDANSETRON HCL 4 MG/2ML IJ SOLN
INTRAMUSCULAR | Status: DC | PRN
  Administered 2020-10-23: 4 mg via INTRAVENOUS

## 2020-10-23 MED ORDER — PROPOFOL 10 MG/ML IV BOLUS
INTRAVENOUS | Status: DC | PRN
  Administered 2020-10-23: 120 mg via INTRAVENOUS

## 2020-10-23 MED ORDER — GLUCAGON HCL RDNA (DIAGNOSTIC) 1 MG IJ SOLR
INTRAMUSCULAR | Status: AC
  Filled 2020-10-23: qty 1

## 2020-10-23 MED ORDER — CIPROFLOXACIN IN D5W 400 MG/200ML IV SOLN
INTRAVENOUS | Status: AC
  Filled 2020-10-23: qty 200

## 2020-10-23 NOTE — Transfer of Care (Signed)
Immediate Anesthesia Transfer of Care Note  Patient: Nathan Mora  Procedure(s) Performed: ENDOSCOPIC RETROGRADE CHOLANGIOPANCREATOGRAPHY (ERCP) WITH PROPOFOL REMOVAL OF STONES BILIARY DILATION BILIARY STENT PLACEMENT  Patient Location: PACU and Endoscopy Unit  Anesthesia Type:General  Level of Consciousness: awake, alert  and patient cooperative  Airway & Oxygen Therapy: Patient Spontanous Breathing and Patient connected to face mask oxygen  Post-op Assessment: Report given to RN and Post -op Vital signs reviewed and stable  Post vital signs: Reviewed and stable  Last Vitals:  Vitals Value Taken Time  BP    Temp    Pulse    Resp    SpO2      Last Pain:  Vitals:   10/23/20 1120  TempSrc: Oral  PainSc: 0-No pain         Complications: No notable events documented.

## 2020-10-23 NOTE — Anesthesia Procedure Notes (Signed)
Procedure Name: Intubation Date/Time: 10/23/2020 12:19 PM Performed by: Eben Burow, CRNA Pre-anesthesia Checklist: Patient identified, Emergency Drugs available, Suction available, Patient being monitored and Timeout performed Patient Re-evaluated:Patient Re-evaluated prior to induction Oxygen Delivery Method: Circle system utilized Preoxygenation: Pre-oxygenation with 100% oxygen Induction Type: IV induction Ventilation: Mask ventilation without difficulty Laryngoscope Size: Mac and 4 Grade View: Grade II Tube type: Oral Tube size: 7.5 mm Number of attempts: 1 Airway Equipment and Method: Stylet Placement Confirmation: ETT inserted through vocal cords under direct vision, positive ETCO2 and breath sounds checked- equal and bilateral Secured at: 22 cm Tube secured with: Tape Dental Injury: Teeth and Oropharynx as per pre-operative assessment

## 2020-10-23 NOTE — Telephone Encounter (Signed)
Spoke with Pharmacist-Per Dr. Rush Landmark prescription should be for Cipro 500mg  BID #2 for 1 day (on day #3 S/P ERCP).

## 2020-10-23 NOTE — H&P (Addendum)
GASTROENTEROLOGY PROCEDURE H&P NOTE   Primary Care Physician: [name], MD  HPI: Nathan Mora is a 75 y.o. male who presents for ERCP for evaluation of abnormal LFTs and concern for biliary stent occlusion.  No longer a Whipple candidate for known pancreatic cancer.  Past Medical History:  Diagnosis Date   Cancer [facility])    Diabetes mellitus without complication ([place])    Glaucoma    left eye   Hyperlipidemia    Lipid disorder 03/29/2012   Past Surgical History:  Procedure Laterality Date   BILIARY BRUSHING  04/11/2020   Procedure: BILIARY BRUSHING;  Surgeon: Irving Copas., MD;  Location: [facility];  Service: Gastroenterology;;   BILIARY STENT PLACEMENT N/A 04/11/2020   Procedure: BILIARY STENT PLACEMENT;  Surgeon: Irving Copas., MD;  Location: [facility];  Service: Gastroenterology;  Laterality: N/A;   BIOPSY  04/11/2020   Procedure: BIOPSY;  Surgeon: Rush Landmark Telford Nab., MD;  Location: [facility];  Service: Gastroenterology;;   ENDOSCOPIC RETROGRADE CHOLANGIOPANCREATOGRAPHY (ERCP) WITH PROPOFOL N/A 04/11/2020   Procedure: ENDOSCOPIC RETROGRADE CHOLANGIOPANCREATOGRAPHY (ERCP) WITH PROPOFOL;  Surgeon: Irving Copas., MD;  Location: [facility];  Service: Gastroenterology;  Laterality: N/A;   ESOPHAGOGASTRODUODENOSCOPY (EGD) WITH PROPOFOL N/A 04/11/2020   Procedure: ESOPHAGOGASTRODUODENOSCOPY (EGD) WITH PROPOFOL;  Surgeon: Rush Landmark Telford Nab., MD;  Location: [facility];  Service: Gastroenterology;  Laterality: N/A;   EUS N/A 04/11/2020   Procedure: UPPER ENDOSCOPIC ULTRASOUND (EUS) RADIAL;  Surgeon: Irving Copas., MD;  Location: [facility];  Service: Gastroenterology;  Laterality: N/A;   FINE NEEDLE ASPIRATION  04/11/2020   Procedure: FINE NEEDLE ASPIRATION (FNA) LINEAR;  Surgeon: Irving Copas., MD;  Location: [facility];  Service: Gastroenterology;;   IR IMAGING GUIDED PORT INSERTION  05/04/2020   PANCREATIC  STENT PLACEMENT  04/11/2020   Procedure: PANCREATIC STENT PLACEMENT;  Surgeon: Irving Copas., MD;  Location: [facility];  Service: Gastroenterology;;   [name]  04/11/2020   Procedure: [name];  Surgeon: Irving Copas., MD;  Location: [facility];  Service: Gastroenterology;;   No current facility-administered medications for this encounter.   No current facility-administered medications for this encounter. No Known Allergies Family History  Problem Relation Age of Onset   Cancer Father        lung   Heart attack Brother    Hypertension Brother    Hypertension Brother    Social History   Socioeconomic History   Marital status: Married    Spouse name: Not on file   Number of children: Not on file   Years of education: Not on file   Highest education level: Not on file  Occupational History   Not on file  Tobacco Use   Smoking status: Never   Smokeless tobacco: Never  Substance and Sexual Activity   Alcohol use: Yes    Alcohol/week: 4.0 standard drinks    Types: 4 Cans of beer per week   Drug use: No   Sexual activity: Yes  Other Topics Concern   Not on file  Social History Narrative   Not on file   Social Determinants of Health   Financial Resource Strain: Not on file  Food Insecurity: Not on file  Transportation Needs: Not on file  Physical Activity: Not on file  Stress: Not on file  Social Connections: Not on file  Intimate Partner Violence: Not on file    Physical Exam: There were no vitals filed for this visit. There is no height or weight on file  to calculate BMI. GEN: NAD EYE: Sclerae anicteric ENT: MMM CV: Non-tachycardic GI: Soft, NT/ND NEURO:  Alert & Oriented x 3  Lab Results: No results for input(s): WBC, HGB, HCT, PLT in the last 72 hours. BMET No results for input(s): NA, K, CL, CO2, GLUCOSE, BUN, CREATININE, CALCIUM in the last 72 hours. LFT No results for input(s): PROT, ALBUMIN, AST, ALT, ALKPHOS,  BILITOT, BILIDIR, IBILI in the last 72 hours. PT/INR No results for input(s): LABPROT, INR in the last 72 hours.   Impression / Plan: This is a 75 y.o.male who presents for ERCP for evaluation of abnormal LFTs and concern for biliary stent occlusion.  No longer a Whipple candidate for known pancreatic cancer.  The risks of an ERCP were discussed at length, including but not limited to the risk of perforation, bleeding, abdominal pain, post-ERCP pancreatitis (while usually mild can be severe and even life threatening).   The risks and benefits of endoscopic evaluation/treatment were discussed with the patient and/or family; these include but are not limited to the risk of perforation, infection, bleeding, missed lesions, lack of diagnosis, severe illness requiring hospitalization, as well as anesthesia and sedation related illnesses.  The patient's history has been reviewed, patient examined, no change in status, and deemed stable for procedure.  The patient and/or family is agreeable to proceed.    Justice Britain, MD [facility] Office # [phone]

## 2020-10-23 NOTE — Telephone Encounter (Signed)
Inbound call from pharmacy requesting a call back stating they have received 2 prescriptions for Cipro and they need to know which one to fill. Please advise. Thank you.

## 2020-10-23 NOTE — Op Note (Addendum)
[facility] Patient Name: Nathan Mora Procedure Date: 10/23/2020 MRN: [id number] Attending MD: Justice Britain , MD Date of Birth: Sep 19, 1945 CSN: [id number] Age: 75 Admit Type: Outpatient Procedure:                ERCP Indications:              Abnormal liver function test, Stent change,                            Unresected Pancreatic Cancer Providers:                Justice Britain, MD, Jeanella Cara, RN,                            Tyrone Apple, Technician, Luan Moore,                            Technician, Cherylynn Ridges, Technician, Jefm Miles                            CRNA Referring MD:             [name]. [name], MD, Izola Price. Sherrill Medicines:                General Anesthesia, Cipro 400 mg IV, Glucagon 0.5                            mg IV, Indomethacin 100 mg PR (given after patient                            sedated) Complications:            No immediate complications. Estimated Blood Loss:     Estimated blood loss was minimal. Procedure:                Pre-Anesthesia Assessment:                           - Prior to the procedure, a History and Physical                            was performed, and patient medications and                            allergies were reviewed. The patient's tolerance of                            previous anesthesia was also reviewed. The risks                            and benefits of the procedure and the sedation                            options and risks were discussed with the patient.                            All  questions were answered, and informed consent                            was obtained. Prior Anticoagulants: The patient has                            taken Lovenox (enoxaparin), last dose was 1 day                            prior to procedure. ASA Grade Assessment: III - A                            patient with severe systemic disease. After                             reviewing the risks and benefits, the patient was                            deemed in satisfactory condition to undergo the                            procedure.                           After obtaining informed consent, the scope was                            passed under direct vision. Throughout the                            procedure, the patient's blood pressure, pulse, and                            oxygen saturations were monitored continuously. The                            [employer] single use                            duodenoscope was introduced through the mouth, and                            used to inject contrast into and used to cannulate                            the bile duct. The ERCP was unusually difficult due                            to challenging cannulation. Successful completion                            of the procedure was aided by performing the  maneuvers documented (below) in this report. The                            patient tolerated the procedure. Scope In: Scope Out: Findings:      A metal biliary stent was visible on the scout film.      The esophagus was successfully intubated under direct vision without       detailed examination of the pharynx, larynx, and associated structures,       and upper GI tract. The upper GI tract was traversed under direct vision       without detailed examination. 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 A biliary       sphincterotomy had been performed. The sphincterotomy appeared open. One       uncovered metal biliary stent originating in the biliary tree was       emerging from the major papilla. The stent was partially occluded.      Initial attempts did not allow for the bile duct to be cannulated. Using       different  wires including the standard 0.035 inch Soft Jagwire and the       Revolution Angled wire, led to placement of the wire into the proximal       bile duct, but it had goen through the stent itself that would not allow       for sphincterotome passage or balloon passage. Switching over to the       short Revolution 0.025 inch Jagwire with significant bowing of the       sphincterotome allowed this to pass through what was noted to be tissue       ingrowth but also not through the uncovered stent. This wire was in the       hepatic ducts. This took nearly 80 minutes just to get to this portion       of the procedure. After swithching to the Revolution Jagtome       sphincterotome I could deeply cannulate the bile duct. Contrast was       injected. I personally interpreted the bile duct images. Ductal flow of       contrast was adequate. Image quality was adequate. Contrast extended to       the hepatic ducts. Opacification of the entire biliary tree except for       the cystic duct and gallbladder was successful. The lower third of the       main bile duct contained a single severe stenosis 15 mm in length from       tissue ingrowth. The middle third of the main bile duct and upper third       of the main bile duct were severely dilated, secondary to aforementioned       stricture. The largest diameter was 18 mm. Dilation of the common bile       duct stricture with a Hurricane 4 mm balloon dilator was successful. To       discover objects, the biliary tree was swept with a retrieval balloon       that finally was able to traverse the stricture. Dark sludge was swept       from the duct.  Small amount of tissue from within the CBD was swept as       well. I made a decision, since tissue ingrowth was the reason for this       narrowing, and since the patient would be undergoing further       chemotherapy to see if he may one day be a candidate for repeat attempt       at resection, to place a  fully covered stent. One [place] 10 mm by 4 cm       fully covered metal biliary stent was placed into the common bile duct       and was noted to be within the previously placed uncovered metal stent.       Bile flowed through the stent. The stent was in good position. There is       a waist on the newly placed stent but this should expand further over       the next few weeks. An occlusion cholangiogram was performed that showed       no further significant biliary pathology.      A pancreatogram was not performed.      The duodenoscope was withdrawn from the patient. Impression:               - J-shpaed deformity of the stomach that required                            left lateral positioning to enter into the                            duodenum..                           - Prior biliary sphincterotomy appeared open.                           - One partially occluded uncovered stent from the                            biliary tree was seen in the major papilla. This                            was swept and sludge removed.                           - After significant difficulty in getting a wire                            through the uncovered SEMS, a single severe biliary                            stricture was found in the lower third of the main                            bile duct. The stricture was malignant appearing                            and was from  tissue ingrowth as noted                            endoscopically as well.                           - The upper third of the main bile duct and middle                            third of the main bile duct were severely dilated,                            secondary to aforementioned stricture.                           - The stricture was dilated to allow passage of the                            sphincterotome and then the balloow to perform                            sweeping.                           - One fully covered  metal biliary stent was placed                            into the common bile duct to traverse the distal                            stricture and aid in improvement of the LFTs. Moderate Sedation:      Not Applicable - Patient had care per Anesthesia. Recommendation:           - The patient will be observed post-procedure,                            until all discharge criteria are met.                           - Discharge patient to home.                           - Patient has a contact number available for                            emergencies. The signs and symptoms of potential                            delayed complications were discussed with the                            patient. Return to normal activities tomorrow.  Written discharge instructions were provided to the                            patient.                           - Low fat diet for 1 week.                           - Observe patient's clinical course.                           - Watch for pancreatitis, bleeding, perforation,                            and cholangitis.                           - Check liver enzymes (AST, ALT, alkaline                            phosphatase, bilirubin) in 1 week or at upcoming                            Oncology clinic visit (will take up to 2 weeks to                            see what LFTs look like s/p fully opened stent.                           - Patient will complete his Levofloxacin as had                            been prescribed by PCP for other reasons (this will                            finish on 9/28). He can do Ciprofloxacin 500 mg                            twice daily for 1-day on 9/29 (Rx sent) to decrease                            post-ERCP infectious complications.                           - The findings and recommendations were discussed                            with the patient.                           - The findings  and recommendations were discussed                            with the  patient's family. Procedure Code(s):        --- Professional ---                           [phone], Endoscopic retrograde                            cholangiopancreatography (ERCP); with removal and                            exchange of stent(s), biliary or pancreatic duct,                            including pre- and post-dilation and guide wire                            passage, when performed, including sphincterotomy,                            when performed, each stent exchanged                           [id number], Endoscopic retrograde                            cholangiopancreatography (ERCP); with removal of                            calculi/debris from biliary/pancreatic duct(s) Diagnosis Code(s):        --- Professional ---                           K31.89, Other diseases of stomach and duodenum                           T85.590A, Other mechanical complication of bile                            duct prosthesis, initial encounter                           K83.1, Obstruction of bile duct                           R94.5, Abnormal results of liver function studies                           Z46.59, Encounter for fitting and adjustment of                            other gastrointestinal appliance and device CPT copyright 2019 [employer]. All rights reserved. The codes documented in this report are preliminary and upon coder review may  be revised to meet current compliance requirements. Justice Britain, MD 10/23/2020 2:50:41 PM Number of Addenda: 0

## 2020-10-23 NOTE — Anesthesia Preprocedure Evaluation (Addendum)
Anesthesia Evaluation  Patient identified by MRN, date of birth, ID band Patient awake    Reviewed: Allergy & Precautions, H&P , NPO status , Patient's Chart, lab work & pertinent test results  Airway Mallampati: III  TM Distance: >3 FB Neck ROM: Full    Dental no notable dental hx. (+) Teeth Intact, Dental Advisory Given   Pulmonary neg pulmonary ROS,    Pulmonary exam normal breath sounds clear to auscultation       Cardiovascular negative cardio ROS   Rhythm:Regular Rate:Normal     Neuro/Psych negative neurological ROS  negative psych ROS   GI/Hepatic negative GI ROS, Neg liver ROS,   Endo/Other  diabetes, Insulin DependentPancreatic CA  Renal/GU negative Renal ROS  negative genitourinary   Musculoskeletal   Abdominal   Peds  Hematology negative hematology ROS (+)   Anesthesia Other Findings   Reproductive/Obstetrics negative OB ROS                            Anesthesia Physical Anesthesia Plan  ASA: 3  Anesthesia Plan: General   Post-op Pain Management:    Induction: Intravenous  PONV Risk Score and Plan: 3 and Ondansetron, Dexamethasone and Midazolam  Airway Management Planned: Oral ETT  Additional Equipment:   Intra-op Plan:   Post-operative Plan: Extubation in OR  Informed Consent: I have reviewed the patients History and Physical, chart, labs and discussed the procedure including the risks, benefits and alternatives for the proposed anesthesia with the patient or authorized representative who has indicated his/her understanding and acceptance.     Dental advisory given  Plan Discussed with: CRNA  Anesthesia Plan Comments:         Anesthesia Quick Evaluation

## 2020-10-24 ENCOUNTER — Encounter (HOSPITAL_COMMUNITY): Payer: Self-pay | Admitting: Gastroenterology

## 2020-10-25 ENCOUNTER — Inpatient Hospital Stay: Payer: Medicare HMO | Attending: Oncology | Admitting: Oncology

## 2020-10-25 ENCOUNTER — Inpatient Hospital Stay: Payer: Medicare HMO

## 2020-10-25 ENCOUNTER — Other Ambulatory Visit: Payer: Self-pay

## 2020-10-25 VITALS — BP 120/72 | HR 90 | Temp 98.1°F | Resp 18 | Ht 68.0 in | Wt 166.2 lb

## 2020-10-25 DIAGNOSIS — K831 Obstruction of bile duct: Secondary | ICD-10-CM | POA: Diagnosis not present

## 2020-10-25 DIAGNOSIS — C250 Malignant neoplasm of head of pancreas: Secondary | ICD-10-CM | POA: Diagnosis not present

## 2020-10-25 DIAGNOSIS — Z794 Long term (current) use of insulin: Secondary | ICD-10-CM | POA: Diagnosis not present

## 2020-10-25 DIAGNOSIS — C259 Malignant neoplasm of pancreas, unspecified: Secondary | ICD-10-CM | POA: Diagnosis not present

## 2020-10-25 DIAGNOSIS — D696 Thrombocytopenia, unspecified: Secondary | ICD-10-CM | POA: Insufficient documentation

## 2020-10-25 DIAGNOSIS — E785 Hyperlipidemia, unspecified: Secondary | ICD-10-CM | POA: Insufficient documentation

## 2020-10-25 DIAGNOSIS — H409 Unspecified glaucoma: Secondary | ICD-10-CM | POA: Diagnosis not present

## 2020-10-25 DIAGNOSIS — Z7189 Other specified counseling: Secondary | ICD-10-CM

## 2020-10-25 DIAGNOSIS — E119 Type 2 diabetes mellitus without complications: Secondary | ICD-10-CM | POA: Insufficient documentation

## 2020-10-25 NOTE — Anesthesia Postprocedure Evaluation (Signed)
Anesthesia Post Note  Patient: CALEY VOLKERT  Procedure(s) Performed: ENDOSCOPIC RETROGRADE CHOLANGIOPANCREATOGRAPHY (ERCP) WITH PROPOFOL REMOVAL OF STONES BILIARY DILATION BILIARY STENT PLACEMENT     Patient location during evaluation: Other Anesthesia Type: General Level of consciousness: awake and alert Pain management: pain level controlled Vital Signs Assessment: post-procedure vital signs reviewed and stable Respiratory status: spontaneous breathing, nonlabored ventilation and respiratory function stable Cardiovascular status: blood pressure returned to baseline and stable Postop Assessment: no apparent nausea or vomiting Anesthetic complications: no   No notable events documented.  Last Vitals:  Vitals:   10/23/20 1450 10/23/20 1455  BP: 134/79 132/82  Pulse: 77 75  Resp: 15 16  Temp:    SpO2: 96% 97%    Last Pain:  Vitals:   10/23/20 1445  TempSrc:   PainSc: 0-No pain                 Chasta Deshpande,W. EDMOND

## 2020-10-25 NOTE — Progress Notes (Signed)
[place] OFFICE PROGRESS NOTE   Diagnosis: Pancreas cancer  INTERVAL HISTORY:   Nathan Mora returns as scheduled.  He was taken the operating room for planned resection of the pancreas mass on 10/05/2020.  The pancreaticoduodenectomy was aborted due to pancreas cancer extending posteriorly and encasing the left renal vein and tissue between the aorta and IVC.  A biopsy of tissue associated with the left renal vein returned positive for carcinoma.  Intraoperative ultrasound confirmed a mass in the head of the pancreas with involvement of the SMA and portal vein.  Numerous "concerning "lymph nodes were identified on ultrasound.  A large lymph node was seen in the dorsal aspect of the pancreas.  It was felt resection would leave positive margins at the IVC and SMA.  The final pathology from the left renal vein tissue returned positive for carcinoma.  Nathan Siska reports anxiety related to the pancreas cancer diagnosis.  No other specific complaint.  Objective:  Vital signs in last 24 hours:  Blood pressure 120/72, pulse 90, temperature 98.1 F (36.7 C), resp. rate 18, height 5\' 8"  (1.727 m), weight 166 lb 3.2 oz (75.4 kg), SpO2 98 %.    Resp: Lungs clear bilaterally Cardio: Regular rate and rhythm GI: Healed midline incision, no hepatosplenomegaly Vascular: No leg edema   Portacath/PICC-without erythema  Lab Results:  Lab Results  Component Value Date   WBC 9.5 10/17/2020   HGB 11.6 (L) 10/17/2020   HCT 34.0 (L) 10/17/2020   MCV 94.1 10/17/2020   PLT 201.0 10/17/2020   NEUTROABS 3.4 10/02/2020    CMP  Lab Results  Component Value Date   NA 135 10/02/2020   K 4.2 10/02/2020   CL 102 10/02/2020   CO2 27 10/02/2020   GLUCOSE 287 (H) 10/02/2020   BUN 12 10/02/2020   CREATININE 0.74 10/02/2020   CALCIUM 9.0 10/02/2020   PROT 6.4 10/02/2020   ALBUMIN 3.7 10/02/2020   AST 42 (H) 10/02/2020   ALT 100 (H) 10/02/2020   ALKPHOS 547 (H) 10/02/2020   BILITOT 1.3 (H)  10/02/2020   GFRNONAA >60 08/23/2020   GFRAA 99 12/21/2019    Lab Results  Component Value Date   CAN199 145 (H) 08/23/2020    Lab Results  Component Value Date   INR 0.9 10/02/2020   LABPROT 10.5 10/02/2020    Imaging:  DG ERCP  Result Date: 10/24/2020 CLINICAL DATA:  Pancreatic cancer.  ERCP EXAM: ERCP TECHNIQUE: Multiple spot images obtained with the fluoroscopic device and submitted for interpretation post-procedure. COMPARISON:  ERCP-04/11/2020; CT chest, abdomen and pelvis-04/19/2020. FLUOROSCOPY TIME:  14 minutes, 50 seconds FINDINGS: Twenty-two spot fluoroscopic images the right upper abdominal quadrant during ERCP are provided for review. Initial image demonstrates an ERCP probe overlying the right upper abdominal quadrant. Pre-existing internal biliary stent overlies expected location of the CBD. Cholecystectomy clips overlies expected location of the gallbladder fossa. Subsequent images demonstrate selective cannulation and opacification of the CBD with biliary plasty and sweeping. Completion images demonstrate placement of an additional biliary stent, at the location of the pre-existing biliary stent, with a persistent waist about its mid aspect (image 26). There is minimal opacification of the common hepatic duct which appears at least moderately dilated. There is no definitive opacification of the pancreatic duct. There is no significant opacification of the intrahepatic biliary tree. IMPRESSION: ERCP with biliary plasty and stenting as above. These images were submitted for radiologic interpretation only. Please see the procedural report for the amount of contrast  and the fluoroscopy time utilized. Electronically Signed   By: Sandi Mariscal M.D.   On: 10/24/2020 08:06   DG C-Arm 1-60 Min-No Report  Result Date: 10/23/2020 Fluoroscopy was utilized by the requesting physician.  No radiographic interpretation.    Medications: I have reviewed the patient's current  medications.   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  Fat planes around the celiac axis preserved.  Less than 90 degree contact of the lateral wall of the SMA, no evidence of metastatic disease, 3 mm left lower lobe pulmonary nodule Cycle 1 FOLFOX 05/09/2020 (plan to add irinotecan with cycle 2 pending tolerance) Cycle 2 FOLFIRINOX 05/23/2020 Cycle 3 FOLFIRINOX 06/06/2020, oxaliplatin dose reduced secondary to thrombocytopenia Cycle 4 FOLFIRINOX 06/27/2020, oxaliplatin further dose reduced secondary to thrombocytopenia CTs at [facility] 07/03/2020-redemonstration of a heterogeneous pancreatic head mass with borderline encasement of the SMA as well as abutment of the portal vein, SMV and likely the IVC.  Similar mildly enlarged porta hepatis and pericaval lymph nodes.  No convincing evidence of distant metastatic disease. Cycle 5 FOLFIRINOX 07/12/2020 Cycle 6 FOLFIRINOX 07/26/2020 Cycle 7 FOLFIRINOX 08/08/2020 Cycle 8 FOLFIRINOX 08/23/2020 10/05/2020-exploratory laparotomy, cholecystectomy, intraoperative ultrasound, excisional biopsy of retroperitoneal tissue-pancreas mass deemed unresectable secondary to involvement of the origin of the left renal vein, portal vein, and SMA, biopsy of left renal vein tissue was positive for carcinoma Obstructive jaundice secondary #1 ERCP 04/11/2020-prominent and floppy major papilla,  dilated bile duct secondary to a distal stricture, placement of an uncovered metal stent, bile duct brushings-reactive glandular cells 3.   Diabetes   4.   Hyperlipidemia   5.   Glaucoma   6.   Port-A-Cath placement 05/04/2020 Interventional Radiology   7.   Mild thrombocytopenia (121,000) 05/09/2020        Disposition: Nathan Marmolejos completed 8 cycles of neoadjuvant FOLFIRINOX.  He was taken to the operating room on 10/05/2020 for a planned pancreaticoduodenectomy.  The procedure was aborted when the tumor was deemed unresectable secondary to involvement of the SMA, portal vein, and origin of the left renal vein.  I discussed treatment options with Nathan. Littler and his wife.  He saw Dr. Carlis Abbott earlier this week.  Radiation is not recommended.  We discussed continuing FOLFIRINOX versus switching to a different systemic therapy regimen.  The CA 19-9 improved while on FOLFIRINOX, but the tumor was not resectable.  He has completed 8 cycles of FOLFIRINOX and we will likely reach dose-limiting toxicity soon.  I recommend gemcitabine/Abraxane.  We reviewed potential toxicities associated with the gemcitabine/Abraxane regimen including the chance of hematologic toxicity and alopecia.  We discussed the fever, rash, and pneumonitis associated with gemcitabine.  We reviewed the allergic reaction and neuropathy seen with Abraxane.  He agrees to proceed.  Nathan Vandeberg plans to seek a second opinion at [facility].  He will return for an office visit with the plan to begin gemcitabine/Abraxane in 2 weeks.  A chemotherapy plan was entered today.  [name], MD  10/25/2020  10:58 AM

## 2020-10-25 NOTE — Progress Notes (Signed)
DISCONTINUE ON PATHWAY REGIMEN - Pancreatic Adenocarcinoma     A cycle is every 14 days:     Oxaliplatin      Leucovorin      Irinotecan      Fluorouracil   **Always confirm dose/schedule in your pharmacy ordering system**  REASON: Other Reason PRIOR TREATMENT: PANOS94: mFOLFIRINOX q14 Days x 4 Cycles TREATMENT RESPONSE: Stable Disease (SD)  START ON PATHWAY REGIMEN - Pancreatic Adenocarcinoma     A cycle is every 28 days:     Nab-paclitaxel (protein bound)      Gemcitabine   **Always confirm dose/schedule in your pharmacy ordering system**  Patient Characteristics: Locally Advanced, Anatomically Unresectable, Second Line, MSS/pMMR or MSI Unknown, Fluoropyrimidine-Based Therapy  First Line Therapeutic Status: Locally Advanced, Anatomically Unresectable Line of Therapy: Second Line Microsatellite/Mismatch Repair Status: Unknown Intent of Therapy: Non-Curative / Palliative Intent, Discussed with Patient

## 2020-10-26 ENCOUNTER — Encounter: Payer: Self-pay | Admitting: Oncology

## 2020-10-26 LAB — CANCER ANTIGEN 19-9: CA 19-9: 1736 U/mL — ABNORMAL HIGH (ref 0–35)

## 2020-10-29 ENCOUNTER — Other Ambulatory Visit: Payer: Self-pay

## 2020-10-29 ENCOUNTER — Encounter: Payer: Self-pay | Admitting: *Deleted

## 2020-10-29 ENCOUNTER — Ambulatory Visit (INDEPENDENT_AMBULATORY_CARE_PROVIDER_SITE_OTHER): Payer: Medicare HMO | Admitting: Family Medicine

## 2020-10-29 ENCOUNTER — Other Ambulatory Visit: Payer: Self-pay | Admitting: *Deleted

## 2020-10-29 ENCOUNTER — Encounter: Payer: Self-pay | Admitting: Family Medicine

## 2020-10-29 ENCOUNTER — Telehealth: Payer: Self-pay | Admitting: Family Medicine

## 2020-10-29 ENCOUNTER — Telehealth: Payer: Self-pay

## 2020-10-29 VITALS — BP 124/70 | HR 74 | Temp 98.2°F | Resp 16 | Ht 68.0 in | Wt 162.2 lb

## 2020-10-29 DIAGNOSIS — N410 Acute prostatitis: Secondary | ICD-10-CM | POA: Diagnosis not present

## 2020-10-29 DIAGNOSIS — C250 Malignant neoplasm of head of pancreas: Secondary | ICD-10-CM | POA: Diagnosis not present

## 2020-10-29 DIAGNOSIS — G4700 Insomnia, unspecified: Secondary | ICD-10-CM

## 2020-10-29 DIAGNOSIS — C259 Malignant neoplasm of pancreas, unspecified: Secondary | ICD-10-CM

## 2020-10-29 DIAGNOSIS — R351 Nocturia: Secondary | ICD-10-CM

## 2020-10-29 LAB — POCT URINALYSIS DIP (MANUAL ENTRY)
Bilirubin, UA: NEGATIVE
Blood, UA: NEGATIVE
Glucose, UA: 100 mg/dL — AB
Leukocytes, UA: NEGATIVE
Nitrite, UA: NEGATIVE
Protein Ur, POC: NEGATIVE mg/dL
Spec Grav, UA: >=1.03 — AB (ref 1.010–1.025)
Urobilinogen, UA: 0.2 E.U./dL
pH, UA: 5 (ref 5.0–8.0)

## 2020-10-29 MED ORDER — LORAZEPAM 1 MG PO TABS: 0.5000 mg | ORAL_TABLET | Freq: Every evening | ORAL | 1 refills | Status: DC | PRN

## 2020-10-29 NOTE — Progress Notes (Signed)
Genetics referral ordered per Dr Benay Spice

## 2020-10-29 NOTE — Telephone Encounter (Signed)
Records faxed to [phone] Dr Allen/Dr Zani/Dr Tedd Sias for 2nd opinion.  The pt has been advised to call our office if he has not heard from them in a few weeks.

## 2020-10-29 NOTE — Progress Notes (Signed)
Subjective:  Patient ID: Nathan Mora, male    DOB: 22-Jul-1945  Age: 75 y.o. MRN: [id number]  CC:  Chief Complaint  Patient presents with   Nocturia    Pt here for recheck on nocturia pt reports doing really well, sxs improved    Insomnia    Lorazepam has allowed pt to sleep through the night, 1 mg dose at bed time and gets up to urinate at 12:30 takes another 1 mg at that time, and sleeps through the night    HPI Nathan Mora presents for   Prostatitis Initially treated September 21, had previous catheterization for surgery.  PSA elevated from 1.99 previously to 13.29 at last visit.  Moderate blood on urinalysis, urine culture positive for coag negative staph.  With other additional none predominating organisms isolated.  Colonizers.  No further testing performed.  He was treated with Levaquin 500 mg daily for 7days. Off abx for few days. No daytime symptoms, and only nocturia only once at night (usually 3 times - even prior to infection).   Insomnia See last visit, phone visit/MyChart visit since that time.  Did have some situational anxiety, psychological insomnia pancreatic cancer, inability to proceed with Whipple procedure on further treatment.  He did meet with oncology recently, Dr. Benay Spice, noted from September 29, plan for change in chemotherapy to Abraxane.  Also plans for second opinion at [facility].  Plan for follow-up office visit in 2 weeks to start gemcitabine and Abraxane.  Insomnia has been treated with lorazepam, 1 mg at bedtime then 1 mg in the middle of the night. Total 2m per night.working well at that dose. No daytime anxiety. Appetite better.   [facility] 10/29/2020 10/17/2020 03/23/2020 12/21/2019 09/15/2019  Decreased Interest 0 0 0 0 0  Down, Depressed, Hopeless 0 1 0 0 0  PHQ - 2 Score 0 1 0 0 0  Altered sleeping 1 3 - - -  Tired, decreased energy 0 3 - - -  Change in appetite 0 3 - - -  Feeling bad or failure about yourself  0 0 - - -  Trouble  concentrating 0 1 - - -  Moving slowly or fidgety/restless 0 0 - - -  Suicidal thoughts 0 0 - - -  PHQ-9 Score 1 11 - - -     History Patient Active Problem List   Diagnosis Date Noted   Goals of care, counseling/discussion 10/25/2020   Malignant ne[place] of head of pancreas ([place] 04/26/2020   Excessive urination at night 12/21/2019   Chronic diarrhea 03/31/2019   Dermatochalasis 11/29/2012   Primary open angle glaucoma of both eyes 11/29/2012   Family history of early CAD 04/23/2012   Hyperlipidemia    Glaucoma    Lipid disorder 03/29/2012   Past Medical History:  Diagnosis Date   Cancer ([place]    Diabetes mellitus without complication ([place]    Glaucoma    left eye   Hyperlipidemia    Lipid disorder 03/29/2012   Past Surgical History:  Procedure Laterality Date   BILIARY BRUSHING  04/11/2020   Procedure: BILIARY BRUSHING;  Surgeon: MIrving Copas, MD;  [facility];  Service: Gastroenterology;;   BILIARY DILATION  10/23/2020   Procedure: BILIARY DILATION;  Surgeon: MIrving Copas, MD;  [facility];  Service: Gastroenterology;;   BILIARY STENT PLACEMENT N/A 04/11/2020   Procedure: BILIARY STENT PLACEMENT;  Surgeon: MIrving Copas, MD;  [facility];  Service: Gastroenterology;  Laterality: N/A;   BILIARY STENT PLACEMENT N/A 10/23/2020   Procedure: BILIARY STENT PLACEMENT;  Surgeon: Rush Landmark Telford Nab., MD;  [facility];  Service: Gastroenterology;  Laterality: N/A;   BIOPSY  04/11/2020   Procedure: BIOPSY;  Surgeon: Rush Landmark Telford Nab., MD;  [facility];  Service: Gastroenterology;;   ENDOSCOPIC RETROGRADE CHOLANGIOPANCREATOGRAPHY (ERCP) WITH PROPOFOL N/A 04/11/2020   Procedure: ENDOSCOPIC RETROGRADE CHOLANGIOPANCREATOGRAPHY (ERCP) WITH PROPOFOL;  Surgeon: Irving Copas., MD;  [facility];  Service: Gastroenterology;  Laterality: N/A;   ENDOSCOPIC RETROGRADE  CHOLANGIOPANCREATOGRAPHY (ERCP) WITH PROPOFOL N/A 10/23/2020   Procedure: ENDOSCOPIC RETROGRADE CHOLANGIOPANCREATOGRAPHY (ERCP) WITH PROPOFOL;  Surgeon: Rush Landmark Telford Nab., MD;  [facility];  Service: Gastroenterology;  Laterality: N/A;   ESOPHAGOGASTRODUODENOSCOPY (EGD) WITH PROPOFOL N/A 04/11/2020   Procedure: ESOPHAGOGASTRODUODENOSCOPY (EGD) WITH PROPOFOL;  Surgeon: Rush Landmark Telford Nab., MD;  [facility];  Service: Gastroenterology;  Laterality: N/A;   EUS N/A 04/11/2020   Procedure: UPPER ENDOSCOPIC ULTRASOUND (EUS) RADIAL;  Surgeon: Irving Copas., MD;  [facility];  Service: Gastroenterology;  Laterality: N/A;   FINE NEEDLE ASPIRATION  04/11/2020   Procedure: FINE NEEDLE ASPIRATION (FNA) LINEAR;  Surgeon: Irving Copas., MD;  [facility];  Service: Gastroenterology;;   IR IMAGING GUIDED PORT INSERTION  05/04/2020   PANCREATIC STENT PLACEMENT  04/11/2020   Procedure: PANCREATIC STENT PLACEMENT;  Surgeon: Irving Copas., MD;  [facility];  Service: Gastroenterology;;   REMOVAL OF STONES  10/23/2020   Procedure: REMOVAL OF STONES;  Surgeon: Irving Copas., MD;  [facility];  Service: Gastroenterology;;   [name]  04/11/2020   Procedure: [name];  Surgeon: Irving Copas., MD;  [facility];  Service: Gastroenterology;;   No Known Allergies Prior to Admission medications   Medication Sig Start Date End Date Taking? Authorizing Provider  acetaminophen (TYLENOL) 500 MG tablet Take 1,000 mg by mouth every 8 (eight) hours as needed for mild pain.    [provider]  atorvastatin (LIPITOR) 40 MG tablet Take 0.5 tablets (20 mg total) by mouth daily at 6 PM. 10/20/19   [name], MD  BD PEN NEEDLE NANO 2ND GEN 32G X 4 MM MISC  08/16/20   [provider]  blood glucose meter kit and supplies Test once per day - fasting or 2 hours after meal.  Dispense based on patient and insurance preference. 04/15/20   [name], MD  brimonidine-timolol (COMBIGAN) 0.2-0.5 % ophthalmic solution Place 1 drop into the left eye every 12 (twelve) hours.    [provider]  Continuous Blood Gluc Sensor (FREESTYLE LIBRE 2 SENSOR) MISC Inject 1 sensor to the skin every 14 days for continuous glucose monitoring. 05/23/20   [provider]  CREON [id number] units CPEP capsule TAKE ONE CAPSULE BY MOUTH WITH  MEALS  AND WITH SNACKS 10/02/20   [name], MD  doxazosin (CARDURA) 2 MG tablet Take 1 tablet (2 mg total) by mouth daily. 10/20/19   [name], MD  gabapentin (NEURONTIN) 800 MG tablet Take 400 mg by mouth at bedtime as needed (pain).    [provider]  ibuprofen (ADVIL) 200 MG tablet Take 400 mg by mouth every evening.    [provider]  insulin aspart (NOVOLOG) 100 UNIT/ML FlexPen Inject 0-6 Units into the skin 3 (three) times daily with meals. Dose per sliding scale, 150-200= 2 units, 201-250= 4 units, 251-300= 6 units 05/08/20   [provider]  Insulin Glargine (  BASAGLAR KWIKPEN) 100 UNIT/ML Inject 9 Units into the skin at bedtime. 05/22/20   [provider]  Lancets (ONETOUCH DELICA PLUS [id number]) [place] 2 HOURS AFTER MEAL 07/13/20   [name], MD  latanoprost (XALATAN) 0.005 % ophthalmic solution Place 1 drop into the left eye at bedtime.    [provider]  lidocaine-prilocaine (EMLA) cream Apply 1 application topically as needed. 05/07/20   [name], MD  LORazepam (ATIVAN) 0.5 MG tablet TAKE ONE TABLET BY MOUTH EVERY 8 HOURS AS NEEDED FOR ANXIETY OR NAUSEA 10/19/20   Maximiano Coss, NP  omeprazole (PRILOSEC) 20 MG capsule TAKE TWO CAPSULES BY MOUTH DAILY 10/22/20   Mansouraty, Telford Nab., MD  ondansetron (ZOFRAN) 8 MG tablet Take 1 tablet (8 mg total) by mouth every 8 (eight) hours as needed for nausea or vomiting. Do not begin  until 72 hours after day 1 chemotherapy 05/11/20   Owens Shark, NP  [facility] ULTRA test strip TEST EVERY DAY FASTING OR 2 HOURS AFTER A MEAL 12/13/19   [name], MD  prochlorperazine (COMPAZINE) 10 MG tablet Take 1 tablet (10 mg total) by mouth every 6 (six) hours as needed for nausea or vomiting. 07/12/20   [name], MD  sildenafil (REVATIO) 20 MG tablet 1 to 3 tabs up to QD prior to onset of sexual activity. Patient taking differently: Take 20-60 mg by mouth daily as needed (erectile dysfunction). 04/08/19   [name], MD   Social History   Socioeconomic History   Marital status: Married    Spouse name: Not on file   Number of children: Not on file   Years of education: Not on file   Highest education level: Not on file  Occupational History   Not on file  Tobacco Use   Smoking status: Never   Smokeless tobacco: Never  Substance and Sexual Activity   Alcohol use: Yes    Alcohol/week: 4.0 standard drinks    Types: 4 Cans of beer per week   Drug use: No   Sexual activity: Yes  Other Topics Concern   Not on file  Social History Narrative   Not on file   Social Determinants of Health   Financial Resource Strain: Not on file  Food Insecurity: Not on file  Transportation Needs: Not on file  Physical Activity: Not on file  Stress: Not on file  Social Connections: Not on file  Intimate Partner Violence: Not on file    Review of Systems Per HPI  Objective:   Vitals:   10/29/20 1314  BP: 124/70  Pulse: 74  Resp: 16  Temp: 98.2 F (36.8 C)  TempSrc: Temporal  SpO2: 97%  Weight: 162 lb 3.2 oz (73.6 kg)  Height: _0  (1.727 m)    Physical Exam Constitutional:      General: He is not in acute distress.    Appearance: Normal appearance. He is well-developed.  HENT:     Head: Normocephalic and atraumatic.  Cardiovascular:     Rate and Rhythm: Normal rate.  Pulmonary:     Effort: Pulmonary effort is normal.  Neurological:     Mental  Status: He is alert and oriented to person, place, and time.  Psychiatric:        Mood and Affect: Mood normal.        Behavior: Behavior normal.   36 minutes spent during visit, including chart review, discussion on prior labs.counseling  and assimilation of information, exam, discussion of plan, and chart completion.    Assessment & Plan:  Nathan Mora is a 75 y.o. male . Nocturia - Plan: POCT urinalysis dipstick Acute prostatitis - Plan: POCT urinalysis dipstick  - resolved. Rtc precautions if any recurrence of urinary symptoms.  Option to repeat PSA at next visit.  Malignant neoplasm of head of pancreas ([place]) - Plan: LORazepam (ATIVAN) 1 MG tablet Insomnia, unspecified type  -Further work-up/plan for chemotherapy as above including second opinion.  He is waiting on some information regarding the CA 19-9, but unsure if that has been related to endoscopic procedure as well.  Advised to let me know if he has not heard further info in next week and I can check into it myself as well.  -Insomnia stable with lorazepam, total 2 mg dose at night.  Option to increase to 2 mg but he would like to remain at the 1 mg dose initially with option of 1 mg in the middle of the night.  New prescription given.  Discussed other medications such as trazodone but will continue same regimen for now.  Potential side effects and risks of benzodiazepines discussed.   Meds ordered this encounter  Medications   LORazepam (ATIVAN) 1 MG tablet    Sig: Take 0.5-2 tablets (0.5-2 mg total) by mouth at bedtime as needed for anxiety. TAKE ONE TABLET BY MOUTH EVERY 8 HOURS AS NEEDED FOR ANXIETY OR NAUSEA    Dispense:  60 tablet    Refill:  1   Patient Instructions  No new meds for urine infection at this time. If any return of urinary symptoms be seen right away as we may need a longer course of antibiotics.  Ok to continue lorazepam - I will write higher dose, but certainly can try 1/2 pill if that is effective.    Let me know in the next week if you have not heard form your specialist about the change in the CA19-9 and I will see what I can find out.     Signed,   [name], MD [place] 10/29/20 1:49 PM

## 2020-10-29 NOTE — Patient Instructions (Addendum)
No new meds for urine infection at this time. If any return of urinary symptoms be seen right away as we may need a longer course of antibiotics.  Ok to continue lorazepam - I will write higher dose, but certainly can try 1/2 pill if that is effective.   Let me know in the next week if you have not heard form your specialist about the change in the CA19-9 and I will see what I can find out.

## 2020-10-29 NOTE — Telephone Encounter (Signed)
-----   Message from Irving Copas., MD sent at 10/29/2020  2:47 PM EDT ----- Regarding: Second opinion at [facility], The patient and his wife would like to move forward with a second opinion from a surgical oncologist at [facility]. I would like the patient to have an opportunity to meet Dr. [name] or Dr. Cristino Martes or Dr. Binnie Rail. This is not for a complete transition of surgical oncology care from Dr. Carlis Abbott at [facility] but rather to see if any type of surgical intervention could be considered based on the patient's recent exploratory laparotomy with Whipple abortion. Please update the patient's wife/patient once the referral has been placed.  Thanks. GM  FYI Dr. [name] and Dr. Ammie Dalton.

## 2020-10-29 NOTE — Progress Notes (Signed)
Foundation One testing ordered via [place] [phone] and MSI/IHC testing ordered via fax request to [place] for Renal Vein tissue dated 10/05/20

## 2020-10-30 ENCOUNTER — Other Ambulatory Visit: Payer: Self-pay | Admitting: Family Medicine

## 2020-10-30 DIAGNOSIS — R350 Frequency of micturition: Secondary | ICD-10-CM

## 2020-10-30 DIAGNOSIS — R351 Nocturia: Secondary | ICD-10-CM

## 2020-11-01 ENCOUNTER — Encounter: Payer: Self-pay | Admitting: Oncology

## 2020-11-01 NOTE — Telephone Encounter (Signed)
Encounter started in error.

## 2020-11-04 ENCOUNTER — Other Ambulatory Visit: Payer: Self-pay | Admitting: Oncology

## 2020-11-06 ENCOUNTER — Other Ambulatory Visit: Payer: Self-pay | Admitting: Genetic Counselor

## 2020-11-06 ENCOUNTER — Other Ambulatory Visit: Payer: Self-pay

## 2020-11-06 ENCOUNTER — Inpatient Hospital Stay: Payer: Medicare HMO | Attending: Genetic Counselor | Admitting: Genetic Counselor

## 2020-11-06 ENCOUNTER — Inpatient Hospital Stay: Payer: Medicare HMO

## 2020-11-06 DIAGNOSIS — H409 Unspecified glaucoma: Secondary | ICD-10-CM | POA: Insufficient documentation

## 2020-11-06 DIAGNOSIS — E785 Hyperlipidemia, unspecified: Secondary | ICD-10-CM | POA: Insufficient documentation

## 2020-11-06 DIAGNOSIS — Z5111 Encounter for antineoplastic chemotherapy: Secondary | ICD-10-CM | POA: Insufficient documentation

## 2020-11-06 DIAGNOSIS — Z794 Long term (current) use of insulin: Secondary | ICD-10-CM | POA: Insufficient documentation

## 2020-11-06 DIAGNOSIS — C250 Malignant neoplasm of head of pancreas: Secondary | ICD-10-CM | POA: Insufficient documentation

## 2020-11-06 DIAGNOSIS — E119 Type 2 diabetes mellitus without complications: Secondary | ICD-10-CM | POA: Insufficient documentation

## 2020-11-06 DIAGNOSIS — Z8051 Family history of malignant neoplasm of kidney: Secondary | ICD-10-CM

## 2020-11-06 DIAGNOSIS — K831 Obstruction of bile duct: Secondary | ICD-10-CM | POA: Insufficient documentation

## 2020-11-06 DIAGNOSIS — Z8507 Personal history of malignant neoplasm of pancreas: Secondary | ICD-10-CM | POA: Diagnosis not present

## 2020-11-06 DIAGNOSIS — Z5189 Encounter for other specified aftercare: Secondary | ICD-10-CM | POA: Insufficient documentation

## 2020-11-06 DIAGNOSIS — Z1379 Encounter for other screening for genetic and chromosomal anomalies: Secondary | ICD-10-CM

## 2020-11-06 DIAGNOSIS — D696 Thrombocytopenia, unspecified: Secondary | ICD-10-CM | POA: Insufficient documentation

## 2020-11-06 NOTE — Progress Notes (Signed)
REFERRING PROVIDER: [name], MD [address] [place], [place] [address]  PRIMARY PROVIDER:  [name], MD  PRIMARY REASON FOR VISIT:  Encounter Diagnoses  Name Primary?   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 male, was seen for a [facility] cancer genetics consultation at the request of Dr. [name] due to a personal and family history of cancer.  Nathan Mora presents to clinic today to discuss the possibility of a hereditary predisposition to cancer, to discuss genetic testing, and to further clarify his future cancer risks, as well as potential cancer risks for family members.   In March 2022, at the age of 52, Nathan Mora was diagnosed with pancreatic cancer.   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[place])   04/26/2020 Cancer Staging   Staging form: Exocrine Pancreas, AJCC 8th Edition - Clinical: Stage III (cT4, cN0, cM0) - Signed by [name], MD on 04/26/2020 Total positive nodes: 0   05/09/2020 - 08/25/2020 Chemotherapy   Patient is on Treatment Plan : PANCREAS Modified FOLFIRINOX q14d x 4 cycles     11/08/2020 -  Chemotherapy   Patient is on Treatment Plan : PANCREATIC Abraxane / Gemcitabine D1,8,15 q28d       Past Medical History:  Diagnosis Date   Cancer ([place])    Diabetes mellitus without complication ([place])    Glaucoma    left eye   Hyperlipidemia    Lipid disorder 03/29/2012    Past Surgical History:  Procedure Laterality Date   BILIARY BRUSHING  04/11/2020   Procedure: BILIARY BRUSHING;  Surgeon: Irving Copas., MD;  Location: [facility];  Service: Gastroenterology;;   BILIARY DILATION  10/23/2020   Procedure: BILIARY DILATION;  Surgeon: Irving Copas., MD;  Location: [facility];  Service:  Gastroenterology;;   BILIARY STENT PLACEMENT N/A 04/11/2020   Procedure: [place];  Surgeon: Irving Copas., MD;  Location: [facility];  Service: Gastroenterology;  Laterality: N/A;   BILIARY STENT PLACEMENT N/A 10/23/2020   Procedure: BILIARY STENT PLACEMENT;  Surgeon: Rush Landmark Telford Nab., MD;  Location: [facility];  Service: Gastroenterology;  Laterality: N/A;   BIOPSY  04/11/2020   Procedure: BIOPSY;  Surgeon: Rush Landmark Telford Nab., MD;  Location: [facility];  Service: Gastroenterology;;   ENDOSCOPIC RETROGRADE CHOLANGIOPANCREATOGRAPHY (ERCP) WITH PROPOFOL N/A 04/11/2020   Procedure: ENDOSCOPIC RETROGRADE CHOLANGIOPANCREATOGRAPHY (ERCP) WITH PROPOFOL;  Surgeon: Irving Copas., MD;  Location: [facility];  Service: Gastroenterology;  Laterality: N/A;   ENDOSCOPIC RETROGRADE CHOLANGIOPANCREATOGRAPHY (ERCP) WITH PROPOFOL N/A 10/23/2020   Procedure: ENDOSCOPIC RETROGRADE CHOLANGIOPANCREATOGRAPHY (ERCP) WITH PROPOFOL;  Surgeon: Rush Landmark Telford Nab., MD;  Location: [facility];  Service: Gastroenterology;  Laterality: N/A;   ESOPHAGOGASTRODUODENOSCOPY (EGD) WITH PROPOFOL N/A 04/11/2020   Procedure: ESOPHAGOGASTRODUODENOSCOPY (EGD) WITH PROPOFOL;  Surgeon: Rush Landmark Telford Nab., MD;  Location: [facility];  Service: Gastroenterology;  Laterality: N/A;   EUS N/A 04/11/2020   Procedure: UPPER ENDOSCOPIC ULTRASOUND (EUS) RADIAL;  Surgeon: Irving Copas., MD;  Location: [facility];  Service: Gastroenterology;  Laterality: N/A;   FINE NEEDLE ASPIRATION  04/11/2020   Procedure: FINE NEEDLE ASPIRATION (FNA) LINEAR;  Surgeon: Irving Copas., MD;  Location: [facility];  Service: Gastroenterology;;   IR IMAGING GUIDED PORT INSERTION  05/04/2020   PANCREATIC STENT  PLACEMENT  04/11/2020   Procedure: PANCREATIC STENT PLACEMENT;  Surgeon: Rush Landmark Telford Nab., MD;  Location: [facility];  Service: Gastroenterology;;   REMOVAL OF STONES  10/23/2020    Procedure: REMOVAL OF STONES;  Surgeon: Irving Copas., MD;  Location: [facility];  Service: Gastroenterology;;   [name]  04/11/2020   Procedure: [name];  Surgeon: Mansouraty, Telford Nab., MD;  Location: [facility];  Service: Gastroenterology;;    Social History   Socioeconomic History   Marital status: Married    Spouse name: Not on file   Number of children: Not on file   Years of education: Not on file   Highest education level: Not on file  Occupational History   Not on file  Tobacco Use   Smoking status: Never   Smokeless tobacco: Never  Substance and Sexual Activity   Alcohol use: Yes    Alcohol/week: 4.0 standard drinks    Types: 4 Cans of beer per week   Drug use: No   Sexual activity: Yes  Other Topics Concern   Not on file  Social History Narrative   Not on file   Social Determinants of Health   Financial Resource Strain: Not on file  Food Insecurity: Not on file  Transportation Needs: Not on file  Physical Activity: Not on file  Stress: Not on file  Social Connections: Not on file     FAMILY HISTORY:  We obtained a detailed, 4-generation family history.  Significant diagnoses are listed below:  Family History  Problem Relation Age of Onset   Lung cancer Father    Kidney cancer Brother 37      Nathan Mora brother has a history of kidney cancer diagnosed at 82 and his father has a history of lung cancer diagnosed in his 70s (he smoked). Nathan Mora is unaware of previous family history of genetic testing for hereditary cancer risks. There no reported Ashkenazi Jewish ancestry.   GENETIC COUNSELING ASSESSMENT: Nathan Mora is a 75 y.o. male with a personal and family history of cancer which is somewhat suggestive of a hereditary predisposition to cancer given his personal history of pancreatic cancer. We, therefore, discussed and recommended the following at today's visit.   DISCUSSION: We discussed that 5 - 10% of cancer is  hereditary. However, 20% of pancreatic cancer is hereditary with most cases of pancreatic cancer associated with BRCA1/2.  There are other genes that can be associated with hereditary pancreatic cancer syndromes.  We discussed that testing is beneficial for several reasons including knowing how to follow individuals after completing their treatment, identifying whether potential treatment options would be beneficial, and understanding if other family members could be at risk for cancer and allowing them to undergo genetic testing.   We reviewed the characteristics, features and inheritance patterns of hereditary cancer syndromes. We also discussed genetic testing, including the appropriate family members to test, the process of testing, insurance coverage and turn-around-time for results. We discussed the implications of a negative, positive, carrier and/or variant of uncertain significant result.   Nathan Mora  was offered a common hereditary cancer panel (47 genes) and an expanded pan-cancer panel (84 genes). Nathan Mora was informed of the benefits and limitations of each panel, including that expanded pan-cancer panels contain genes that do not have clear management guidelines at this point in time.  We also discussed that as the number of genes included on a panel increases, the chances of variants of uncertain significance increases.  After considering the benefits and  limitations of each gene panel, Nathan Mora  elected to have [facility] Multi-Cancer Panel (84 genes).   The Multi-Cancer Panel with pancreatitis genes and preliminary pancreatic cancer genes offered by [facility] includes sequencing and/or deletion duplication testing of the following 91 genes: AIP, ALK, APC, ATM, AXIN2,BAP1,  BARD1, BLM, BMPR1A, BRCA1, BRCA2, BRIP1, CASR, CDC73, CDH1, CDK4, CDKN1B, CDKN1C, CDKN2A (p14ARF), CDKN2A (p16INK4a), CEBPA, CFTR, CHEK2, CPA1, CTNNA1, CTRC, DICER1, DIS3L2, EGFR (c.2369C>T, p.Thr790Met variant only), EPCAM  (Deletion/duplication testing only), FANCC, FH, FLCN, GATA2, GPC3, GREM1 (Promoter region deletion/duplication testing only), HOXB13 (c.251G>A, p.Gly84Glu), HRAS, KIT, MAX, MEN1, MET, MITF (c.952G>A, p.Glu318Lys variant only), MLH1, MSH2, MSH3, MSH6, MUTYH, NBN, NF1, NF2, NTHL1, PALB2, PALLD, PDGFRA, PHOX2B, PMS2, POLD1, POLE, POT1, PRKAR1A, PRSS1, PTCH1, PTEN, RAD50, RAD51C, RAD51D, RB1, RECQL4, RET, RNF43, RUNX1, SDHAF2, SDHA (sequence changes only), SDHB, SDHC, SDHD, SMAD4, SMARCA4, SMARCB1, SMARCE1, SPINK1, STK11, SUFU, TERC, TERT, TMEM127, TP53, TSC1, TSC2, VHL, WRN and WT1.   Based on Nathan Mora personal and family history of cancer, he meets medical criteria for genetic testing. Despite that he meets criteria, he may still have an out of pocket cost. We discussed that if his out of pocket cost for testing is over $100, the laboratory will call and confirm whether he wants to proceed with testing.  If the out of pocket cost of testing is less than $100 he will be billed by the genetic testing laboratory.   PLAN: After considering the risks, benefits, and limitations, Nathan Mora provided informed consent to pursue genetic testing and the blood sample was sent to [facility] for analysis of the Multi-Cancer Panel (84 genes). Results should be available within approximately 2-3 weeks' time, at which point they will be disclosed by telephone to Nathan Mora, as will any additional recommendations warranted by these results. Nathan Mora will receive a summary of his genetic counseling visit and a copy of his results once available. This information will also be available in Epic.   Nathan Passy, MS, [facility] Genetic Counselor [email or web address] (P) [phone]  The patient was seen for a total of 30 minutes in face-to-face genetic counseling. The patient was seen alone.  Drs. Magrinat, Lindi Adie and/or Burr Medico were available to discuss this case as needed.    _______________________________________________________________________ For Office Staff:  Number of people involved in session: 1 Was an Intern/ student involved with case: no

## 2020-11-07 ENCOUNTER — Encounter: Payer: Self-pay | Admitting: Genetic Counselor

## 2020-11-07 ENCOUNTER — Encounter: Payer: Self-pay | Admitting: Oncology

## 2020-11-07 DIAGNOSIS — Z8051 Family history of malignant neoplasm of kidney: Secondary | ICD-10-CM | POA: Insufficient documentation

## 2020-11-08 ENCOUNTER — Inpatient Hospital Stay: Payer: Medicare HMO | Admitting: Nurse Practitioner

## 2020-11-08 ENCOUNTER — Inpatient Hospital Stay: Payer: Medicare HMO

## 2020-11-08 DIAGNOSIS — C250 Malignant neoplasm of head of pancreas: Secondary | ICD-10-CM | POA: Diagnosis not present

## 2020-11-08 DIAGNOSIS — C259 Malignant neoplasm of pancreas, unspecified: Secondary | ICD-10-CM | POA: Diagnosis not present

## 2020-11-09 DIAGNOSIS — C250 Malignant neoplasm of head of pancreas: Secondary | ICD-10-CM | POA: Diagnosis not present

## 2020-11-12 DIAGNOSIS — C259 Malignant neoplasm of pancreas, unspecified: Secondary | ICD-10-CM | POA: Diagnosis not present

## 2020-11-12 DIAGNOSIS — C250 Malignant neoplasm of head of pancreas: Secondary | ICD-10-CM | POA: Diagnosis not present

## 2020-11-18 ENCOUNTER — Other Ambulatory Visit: Payer: Self-pay | Admitting: Oncology

## 2020-11-22 ENCOUNTER — Inpatient Hospital Stay (HOSPITAL_BASED_OUTPATIENT_CLINIC_OR_DEPARTMENT_OTHER): Payer: Medicare HMO | Admitting: Oncology

## 2020-11-22 ENCOUNTER — Other Ambulatory Visit: Payer: Self-pay

## 2020-11-22 ENCOUNTER — Inpatient Hospital Stay: Payer: Medicare HMO

## 2020-11-22 VITALS — BP 138/90 | HR 77 | Temp 98.0°F | Resp 18 | Ht 68.0 in | Wt 171.0 lb

## 2020-11-22 DIAGNOSIS — C250 Malignant neoplasm of head of pancreas: Secondary | ICD-10-CM

## 2020-11-22 DIAGNOSIS — D696 Thrombocytopenia, unspecified: Secondary | ICD-10-CM | POA: Diagnosis not present

## 2020-11-22 DIAGNOSIS — Z5111 Encounter for antineoplastic chemotherapy: Secondary | ICD-10-CM | POA: Diagnosis not present

## 2020-11-22 DIAGNOSIS — E785 Hyperlipidemia, unspecified: Secondary | ICD-10-CM | POA: Diagnosis not present

## 2020-11-22 DIAGNOSIS — Z794 Long term (current) use of insulin: Secondary | ICD-10-CM | POA: Diagnosis not present

## 2020-11-22 DIAGNOSIS — E119 Type 2 diabetes mellitus without complications: Secondary | ICD-10-CM | POA: Diagnosis not present

## 2020-11-22 DIAGNOSIS — H409 Unspecified glaucoma: Secondary | ICD-10-CM | POA: Diagnosis not present

## 2020-11-22 DIAGNOSIS — K831 Obstruction of bile duct: Secondary | ICD-10-CM | POA: Diagnosis not present

## 2020-11-22 DIAGNOSIS — C259 Malignant neoplasm of pancreas, unspecified: Secondary | ICD-10-CM

## 2020-11-22 DIAGNOSIS — Z5189 Encounter for other specified aftercare: Secondary | ICD-10-CM | POA: Diagnosis not present

## 2020-11-22 LAB — CMP (CANCER CENTER ONLY)
ALT: 16 U/L (ref 0–44)
AST: 14 U/L — ABNORMAL LOW (ref 15–41)
Albumin: 4.1 g/dL (ref 3.5–5.0)
Alkaline Phosphatase: 138 U/L — ABNORMAL HIGH (ref 38–126)
Anion gap: 8 (ref 5–15)
BUN: 13 mg/dL (ref 8–23)
CO2: 25 mmol/L (ref 22–32)
Calcium: 8.7 mg/dL — ABNORMAL LOW (ref 8.9–10.3)
Chloride: 101 mmol/L (ref 98–111)
Creatinine: 0.69 mg/dL (ref 0.61–1.24)
GFR, Estimated: >60 mL/min (ref >60)
Glucose, Bld: 240 mg/dL — ABNORMAL HIGH (ref 70–99)
Potassium: 3.9 mmol/L (ref 3.5–5.1)
Sodium: 134 mmol/L — ABNORMAL LOW (ref 135–145)
Total Bilirubin: 0.8 mg/dL (ref 0.3–1.2)
Total Protein: 6 g/dL — ABNORMAL LOW (ref 6.5–8.1)

## 2020-11-22 LAB — CBC WITH DIFFERENTIAL (CANCER CENTER ONLY)
Abs Immature Granulocytes: 0.02 10*3/uL (ref 0.00–0.07)
Basophils Absolute: 0 10*3/uL (ref 0.0–0.1)
Basophils Relative: 0 %
Eosinophils Absolute: 0.1 10*3/uL (ref 0.0–0.5)
Eosinophils Relative: 1 %
HCT: 36.4 % — ABNORMAL LOW (ref 39.0–52.0)
Hemoglobin: 12 g/dL — ABNORMAL LOW (ref 13.0–17.0)
Immature Granulocytes: 0 %
Lymphocytes Relative: 54 %
Lymphs Abs: 2.6 10*3/uL (ref 0.7–4.0)
MCH: 29.5 pg (ref 26.0–34.0)
MCHC: 33 g/dL (ref 30.0–36.0)
MCV: 89.4 fL (ref 80.0–100.0)
Monocytes Absolute: 0.6 10*3/uL (ref 0.1–1.0)
Monocytes Relative: 12 %
Neutro Abs: 1.6 10*3/uL — ABNORMAL LOW (ref 1.7–7.7)
Neutrophils Relative %: 33 %
Platelet Count: 95 10*3/uL — ABNORMAL LOW (ref 150–400)
RBC: 4.07 MIL/uL — ABNORMAL LOW (ref 4.22–5.81)
RDW: 13.4 % (ref 11.5–15.5)
WBC Count: 4.9 10*3/uL (ref 4.0–10.5)
nRBC: 0 % (ref 0.0–0.2)

## 2020-11-22 MED ORDER — SODIUM CHLORIDE 0.9 % IV SOLN: Freq: Once | INTRAVENOUS | Status: AC

## 2020-11-22 MED ORDER — PROCHLORPERAZINE MALEATE 10 MG PO TABS
10.0000 mg | ORAL_TABLET | Freq: Once | ORAL | Status: AC
  Administered 2020-11-22: 10 mg via ORAL
  Filled 2020-11-22: qty 1

## 2020-11-22 MED ORDER — SODIUM CHLORIDE 0.9 % IV SOLN
800.0000 mg/m2 | Freq: Once | INTRAVENOUS | Status: AC
  Administered 2020-11-22: 1520 mg via INTRAVENOUS
  Filled 2020-11-22: qty 15.78

## 2020-11-22 MED ORDER — PACLITAXEL PROTEIN-BOUND CHEMO INJECTION 100 MG
100.0000 mg/m2 | Freq: Once | INTRAVENOUS | Status: AC
  Administered 2020-11-22: 200 mg via INTRAVENOUS
  Filled 2020-11-22: qty 40

## 2020-11-22 MED ORDER — HEPARIN SOD (PORK) LOCK FLUSH 100 UNIT/ML IV SOLN
500.0000 [IU] | Freq: Once | INTRAVENOUS | Status: AC | PRN
  Administered 2020-11-22: 500 [IU]

## 2020-11-22 MED ORDER — SODIUM CHLORIDE 0.9% FLUSH
10.0000 mL | INTRAVENOUS | Status: DC | PRN
  Administered 2020-11-22: 10 mL

## 2020-11-22 NOTE — Patient Instructions (Addendum)
[facility]   Discharge Instructions: Thank you for choosing [facility] to provide your oncology and hematology care.   If you have a lab appointment with the [facility], please go directly to the [facility] and check in at the registration area.   Wear comfortable clothing and clothing appropriate for easy access to any Portacath or PICC line.   We strive to give you quality time with your provider. You may need to reschedule your appointment if you arrive late (15 or more minutes).  Arriving late affects you and other patients whose appointments are after yours.  Also, if you miss three or more appointments without notifying the office, you may be dismissed from the clinic at the provider's discretion.      For prescription refill requests, have your pharmacy contact our office and allow 72 hours for refills to be completed.    Today you received the following chemotherapy and/or immunotherapy agents Paclitaxel-protein bound (ABRAXANE) & Gemcitabine (GEMZAR).      To help prevent nausea and vomiting after your treatment, we encourage you to take your nausea medication as directed.  BELOW ARE SYMPTOMS THAT SHOULD BE REPORTED IMMEDIATELY: *FEVER GREATER THAN 100.4 F (38 C) OR HIGHER *CHILLS OR SWEATING *NAUSEA AND VOMITING THAT IS NOT CONTROLLED WITH YOUR NAUSEA MEDICATION *UNUSUAL SHORTNESS OF BREATH *UNUSUAL BRUISING OR BLEEDING *URINARY PROBLEMS (pain or burning when urinating, or frequent urination) *BOWEL PROBLEMS (unusual diarrhea, constipation, pain near the anus) TENDERNESS IN MOUTH AND THROAT WITH OR WITHOUT PRESENCE OF ULCERS (sore throat, sores in mouth, or a toothache) UNUSUAL RASH, SWELLING OR PAIN  UNUSUAL VAGINAL DISCHARGE OR ITCHING   Items with * indicate a potential emergency and should be followed up as soon as possible or go to the Emergency Department if any problems should occur.  Please show the CHEMOTHERAPY ALERT  CARD or IMMUNOTHERAPY ALERT CARD at check-in to the Emergency Department and triage nurse.  Should you have questions after your visit or need to cancel or reschedule your appointment, please contact [facility]  Dept: [phone]  and follow the prompts.  Office hours are 8:00 a.m. to 4:30 p.m. Monday - Friday. Please note that voicemails left after 4:00 p.m. may not be returned until the following business day.  We are closed weekends and major holidays. You have access to a nurse at all times for urgent questions. Please call the main number to the clinic Dept: [phone] and follow the prompts.   For any non-urgent questions, you may also contact your provider using MyChart. We now offer e-Visits for anyone 39 and older to request care online for non-urgent symptoms. For details visit [email or web address].   Also download the MyChart app! Go to the app store, search "MyChart", open the app, select [facility], and log in with your MyChart username and password.  Due to Covid, a mask is required upon entering the hospital/clinic. If you do not have a mask, one will be given to you upon arrival. For doctor visits, patients may have 1 support person aged 11 or older with them. For treatment visits, patients cannot have anyone with them due to current Covid guidelines and our immunocompromised population.   Nanoparticle Albumin-Bound Paclitaxel injection What is this medication? NANOPARTICLE ALBUMIN-BOUND PACLITAXEL (Na no PAHR ti kuhl al BYOO muhn-bound PAK li TAX el) is a chemotherapy drug. It targets fast dividing cells, like cancer cells, and causes these cells to die. This medicine  is used to treat advanced breast cancer, lung cancer, and pancreatic cancer. This medicine may be used for other purposes; ask your health care provider or pharmacist if you have questions. COMMON BRAND NAME(S): Abraxane What should I tell my care team before I take this  medication? They need to know if you have any of these conditions: kidney disease liver disease low blood counts, like low white cell, platelet, or red cell counts lung or breathing disease, like asthma tingling of the fingers or toes, or other nerve disorder an unusual or allergic reaction to paclitaxel, albumin, other chemotherapy, other medicines, foods, dyes, or preservatives pregnant or trying to get pregnant breast-feeding How should I use this medication? This drug is given as an infusion into a vein. It is administered in a hospital or clinic by a specially trained health care professional. Talk to your pediatrician regarding the use of this medicine in children. Special care may be needed. Overdosage: If you think you have taken too much of this medicine contact a poison control center or emergency room at once. NOTE: This medicine is only for you. Do not share this medicine with others. What if I miss a dose? It is important not to miss your dose. Call your doctor or health care professional if you are unable to keep an appointment. What may interact with this medication? This medicine may interact with the following medications: antiviral medicines for hepatitis, HIV or AIDS certain antibiotics like erythromycin and clarithromycin certain medicines for fungal infections like ketoconazole and itraconazole certain medicines for seizures like carbamazepine, phenobarbital, phenytoin gemfibrozil nefazodone rifampin St. John's wort This list may not describe all possible interactions. Give your health care provider a list of all the medicines, herbs, non-prescription drugs, or dietary supplements you use. Also tell them if you smoke, drink alcohol, or use illegal drugs. Some items may interact with your medicine. What should I watch for while using this medication? Your condition will be monitored carefully while you are receiving this medicine. You will need important blood work  done while you are taking this medicine. This medicine can cause serious allergic reactions. If you experience allergic reactions like skin rash, itching or hives, swelling of the face, lips, or tongue, tell your doctor or health care professional right away. In some cases, you may be given additional medicines to help with side effects. Follow all directions for their use. This drug may make you feel generally unwell. This is not uncommon, as chemotherapy can affect healthy cells as well as cancer cells. Report any side effects. Continue your course of treatment even though you feel ill unless your doctor tells you to stop. Call your doctor or health care professional for advice if you get a fever, chills or sore throat, or other symptoms of a cold or flu. Do not treat yourself. This drug decreases your body's ability to fight infections. Try to avoid being around people who are sick. This medicine may increase your risk to bruise or bleed. Call your doctor or health care professional if you notice any unusual bleeding. Be careful brushing and flossing your teeth or using a toothpick because you may get an infection or bleed more easily. If you have any dental work done, tell your dentist you are receiving this medicine. Avoid taking products that contain aspirin, acetaminophen, ibuprofen, naproxen, or ketoprofen unless instructed by your doctor. These medicines may hide a fever. Do not become pregnant while taking this medicine or for 6 months after stopping it.  Women should inform their doctor if they wish to become pregnant or think they might be pregnant. Men should not father a child while taking this medicine or for 3 months after stopping it. There is a potential for serious side effects to an unborn child. Talk to your health care professional or pharmacist for more information. Do not breast-feed an infant while taking this medicine or for 2 weeks after stopping it. This medicine may interfere  with the ability to get pregnant or to father a child. You should talk to your doctor or health care professional if you are concerned about your fertility. What side effects may I notice from receiving this medication? Side effects that you should report to your doctor or health care professional as soon as possible: allergic reactions like skin rash, itching or hives, swelling of the face, lips, or tongue breathing problems changes in vision fast, irregular heartbeat low blood pressure mouth sores pain, tingling, numbness in the hands or feet signs of decreased platelets or bleeding - bruising, pinpoint red spots on the skin, black, tarry stools, blood in the urine signs of decreased red blood cells - unusually weak or tired, feeling faint or lightheaded, falls signs of infection - fever or chills, cough, sore throat, pain or difficulty passing urine signs and symptoms of liver injury like dark yellow or brown urine; general ill feeling or flu-like symptoms; light-colored stools; loss of appetite; nausea; right upper belly pain; unusually weak or tired; yellowing of the eyes or skin swelling of the ankles, feet, hands unusually slow heartbeat Side effects that usually do not require medical attention (report to your doctor or health care professional if they continue or are bothersome): diarrhea hair loss loss of appetite nausea, vomiting tiredness This list may not describe all possible side effects. Call your doctor for medical advice about side effects. You may report side effects to FDA at 1-800-FDA-1088. Where should I keep my medication? This drug is given in a hospital or clinic and will not be stored at home. NOTE: This sheet is a summary. It may not cover all possible information. If you have questions about this medicine, talk to your doctor, pharmacist, or health care provider.  2022 Elsevier/Gold Standard (2016-09-16 13:03:45)  Gemcitabine injection What is this  medication? GEMCITABINE (jem SYE ta been) is a chemotherapy drug. This medicine is used to treat many types of cancer like breast cancer, lung cancer, pancreatic cancer, and ovarian cancer. This medicine may be used for other purposes; ask your health care provider or pharmacist if you have questions. COMMON BRAND NAME(S): Gemzar, Infugem What should I tell my care team before I take this medication? They need to know if you have any of these conditions: blood disorders infection kidney disease liver disease lung or breathing disease, like asthma recent or ongoing radiation therapy an unusual or allergic reaction to gemcitabine, other chemotherapy, other medicines, foods, dyes, or preservatives pregnant or trying to get pregnant breast-feeding How should I use this medication? This drug is given as an infusion into a vein. It is administered in a hospital or clinic by a specially trained health care professional. Talk to your pediatrician regarding the use of this medicine in children. Special care may be needed. Overdosage: If you think you have taken too much of this medicine contact a poison control center or emergency room at once. NOTE: This medicine is only for you. Do not share this medicine with others. What if I miss a dose? It is  important not to miss your dose. Call your doctor or health care professional if you are unable to keep an appointment. What may interact with this medication? medicines to increase blood counts like filgrastim, pegfilgrastim, sargramostim some other chemotherapy drugs like cisplatin vaccines Talk to your doctor or health care professional before taking any of these medicines: acetaminophen aspirin ibuprofen ketoprofen naproxen This list may not describe all possible interactions. Give your health care provider a list of all the medicines, herbs, non-prescription drugs, or dietary supplements you use. Also tell them if you smoke, drink alcohol, or  use illegal drugs. Some items may interact with your medicine. What should I watch for while using this medication? Visit your doctor for checks on your progress. This drug may make you feel generally unwell. This is not uncommon, as chemotherapy can affect healthy cells as well as cancer cells. Report any side effects. Continue your course of treatment even though you feel ill unless your doctor tells you to stop. In some cases, you may be given additional medicines to help with side effects. Follow all directions for their use. Call your doctor or health care professional for advice if you get a fever, chills or sore throat, or other symptoms of a cold or flu. Do not treat yourself. This drug decreases your body's ability to fight infections. Try to avoid being around people who are sick. This medicine may increase your risk to bruise or bleed. Call your doctor or health care professional if you notice any unusual bleeding. Be careful brushing and flossing your teeth or using a toothpick because you may get an infection or bleed more easily. If you have any dental work done, tell your dentist you are receiving this medicine. Avoid taking products that contain aspirin, acetaminophen, ibuprofen, naproxen, or ketoprofen unless instructed by your doctor. These medicines may hide a fever. Do not become pregnant while taking this medicine or for 6 months after stopping it. Women should inform their doctor if they wish to become pregnant or think they might be pregnant. Men should not father a child while taking this medicine and for 3 months after stopping it. There is a potential for serious side effects to an unborn child. Talk to your health care professional or pharmacist for more information. Do not breast-feed an infant while taking this medicine or for at least 1 week after stopping it. Men should inform their doctors if they wish to father a child. This medicine may lower sperm counts. Talk with your  doctor or health care professional if you are concerned about your fertility. What side effects may I notice from receiving this medication? Side effects that you should report to your doctor or health care professional as soon as possible: allergic reactions like skin rash, itching or hives, swelling of the face, lips, or tongue breathing problems pain, redness, or irritation at site where injected signs and symptoms of a dangerous change in heartbeat or heart rhythm like chest pain; dizziness; fast or irregular heartbeat; palpitations; feeling faint or lightheaded, falls; breathing problems signs of decreased platelets or bleeding - bruising, pinpoint red spots on the skin, black, tarry stools, blood in the urine signs of decreased red blood cells - unusually weak or tired, feeling faint or lightheaded, falls signs of infection - fever or chills, cough, sore throat, pain or difficulty passing urine signs and symptoms of kidney injury like trouble passing urine or change in the amount of urine signs and symptoms of liver injury like dark  yellow or brown urine; general ill feeling or flu-like symptoms; light-colored stools; loss of appetite; nausea; right upper belly pain; unusually weak or tired; yellowing of the eyes or skin swelling of ankles, feet, hands Side effects that usually do not require medical attention (report to your doctor or health care professional if they continue or are bothersome): constipation diarrhea hair loss loss of appetite nausea rash vomiting This list may not describe all possible side effects. Call your doctor for medical advice about side effects. You may report side effects to FDA at 1-800-FDA-1088. Where should I keep my medication? This drug is given in a hospital or clinic and will not be stored at home. NOTE: This sheet is a summary. It may not cover all possible information. If you have questions about this medicine, talk to your doctor, pharmacist, or  health care provider.  2022 Elsevier/Gold Standard (2017-04-08 18:06:11)

## 2020-11-22 NOTE — Progress Notes (Signed)
Patient presents for treatment. RN assessment completed along with the following:  Labs/vitals reviewed - Yes, and Plt 95, okay to proceed per Dr. Carin Hock note.    Weight within 10% of previous measurement - Yes Oncology Treatment Attestation completed for current therapy- Yes, on date 10/25/2020 Informed consent completed and reflects current therapy/intent - Yes, on date 11/22/2020             Provider progress note reviewed - Yes, today's provider note was reviewed. Treatment/Antibody/Supportive plan reviewed - Yes, and there are no adjustments needed for today's treatment. S&H and other orders reviewed - Yes, and there are no additional orders identified. Previous treatment date reviewed - Yes, and the appropriate amount of time has elapsed between treatments. Clinic Hand Off Received from - Yes, [facility] nurse note: dose reduction and Udenyca tomorrow.  Patient to proceed with treatment.

## 2020-11-22 NOTE — Progress Notes (Signed)
[facility] OFFICE PROGRESS NOTE   Diagnosis: Pancreas cancer  INTERVAL HISTORY:   Nathan Mora returns as scheduled.  He feels well.  Good appetite.  He has been golfing.  He has persistent numbness in the left thumb.  No other numbness.  He saw Dr. Zenia Resides on 11/09/2020.  Dr. Zenia Resides recommends proceeding with gemcitabine/Abraxane and reimage after 3 months.  Objective:  Vital signs in last 24 hours:  Blood pressure 138/90, pulse 77, temperature 98 F (36.7 C), temperature source Oral, resp. rate 18, height '5\' 8"'  (1.727 m), weight 171 lb (77.6 kg), SpO2 100 %.    HEENT: No thrush or ulcers Resp: Lungs clear bilaterally Cardio: Regular rate and rhythm GI: No hepatosplenomegaly, no mass, nontender Vascular: No leg edema   Portacath/PICC-without erythema  Lab Results:  Lab Results  Component Value Date   WBC 4.9 11/22/2020   HGB 12.0 (L) 11/22/2020   HCT 36.4 (L) 11/22/2020   MCV 89.4 11/22/2020   PLT 95 (L) 11/22/2020   NEUTROABS 1.6 (L) 11/22/2020    CMP  Lab Results  Component Value Date   NA 134 (L) 11/22/2020   K 3.9 11/22/2020   CL 101 11/22/2020   CO2 25 11/22/2020   GLUCOSE 240 (H) 11/22/2020   BUN 13 11/22/2020   CREATININE 0.69 11/22/2020   CALCIUM 8.7 (L) 11/22/2020   PROT 6.0 (L) 11/22/2020   ALBUMIN 4.1 11/22/2020   AST 14 (L) 11/22/2020   ALT 16 11/22/2020   ALKPHOS 138 (H) 11/22/2020   BILITOT 0.8 11/22/2020   GFRNONAA >60 11/22/2020   GFRAA 99 12/21/2019     Medications: I have reviewed the patient's current medications.   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  Fat planes around the celiac axis preserved.  Less than 90 degree contact of the lateral wall of the SMA, no evidence of metastatic disease, 3 mm left lower lobe pulmonary nodule Cycle 1 FOLFOX 05/09/2020 (plan to add irinotecan with cycle 2 pending tolerance) Cycle 2 FOLFIRINOX 05/23/2020 Cycle 3 FOLFIRINOX 06/06/2020, oxaliplatin dose reduced secondary to thrombocytopenia Cycle 4 FOLFIRINOX 06/27/2020, oxaliplatin further dose reduced secondary to thrombocytopenia CTs at [facility] 07/03/2020-redemonstration of a heterogeneous pancreatic head mass with borderline encasement of the SMA as well as abutment of the portal vein, SMV and likely the IVC.  Similar mildly enlarged porta hepatis and pericaval lymph nodes.  No convincing evidence of distant metastatic disease. 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  More than 180 degree involvement of the SMV and SMA Cycle 1 gemcitabine/Abraxane 11/22/2020 Obstructive  jaundice secondary #1 ERCP 04/11/2020-prominent and floppy major papilla, dilated bile duct secondary to a distal stricture, placement of an uncovered metal stent, bile duct brushings-reactive glandular cells 3.   Diabetes   4.    Hyperlipidemia   5.   Glaucoma   6.   Port-A-Cath placement 05/04/2020 Interventional Radiology   7.   Mild thrombocytopenia (121,000) 05/09/2020        Disposition: Nathan Mora appears stable.  The plan is to begin gemcitabine/Abraxane today.  He will undergo restaging CT[facility] at [facility] after 3 months of treatment.  The platelets and white count are mildly decreased today.  I dose reduce the gemcitabine and he will receive Udenyca on day 2.  Nathan Mora will return for an office visit and chemotherapy in 2 weeks.  [name], MD  11/22/2020  10:33 AM

## 2020-11-22 NOTE — Progress Notes (Signed)
Patient seen by Dr. Benay Spice today  Vitals are within treatment parameters.  Labs reviewed by [name] NP and are within treatment parameters.  Per physician team, patient is ready for treatment. Please note that modifications are being made to the treatment plan including       Please note a dose reduction added Udenyca for tomorrow and again in 16 weeks

## 2020-11-23 ENCOUNTER — Inpatient Hospital Stay: Payer: Medicare HMO

## 2020-11-23 ENCOUNTER — Encounter: Payer: Self-pay | Admitting: Genetic Counselor

## 2020-11-23 ENCOUNTER — Telehealth: Payer: Self-pay | Admitting: Genetic Counselor

## 2020-11-23 VITALS — BP 124/82 | HR 82 | Temp 98.4°F | Resp 18

## 2020-11-23 DIAGNOSIS — Z5189 Encounter for other specified aftercare: Secondary | ICD-10-CM | POA: Diagnosis not present

## 2020-11-23 DIAGNOSIS — E785 Hyperlipidemia, unspecified: Secondary | ICD-10-CM | POA: Diagnosis not present

## 2020-11-23 DIAGNOSIS — E119 Type 2 diabetes mellitus without complications: Secondary | ICD-10-CM | POA: Diagnosis not present

## 2020-11-23 DIAGNOSIS — K831 Obstruction of bile duct: Secondary | ICD-10-CM | POA: Diagnosis not present

## 2020-11-23 DIAGNOSIS — D696 Thrombocytopenia, unspecified: Secondary | ICD-10-CM | POA: Diagnosis not present

## 2020-11-23 DIAGNOSIS — C250 Malignant neoplasm of head of pancreas: Secondary | ICD-10-CM

## 2020-11-23 DIAGNOSIS — Z5111 Encounter for antineoplastic chemotherapy: Secondary | ICD-10-CM | POA: Diagnosis not present

## 2020-11-23 DIAGNOSIS — Z794 Long term (current) use of insulin: Secondary | ICD-10-CM | POA: Diagnosis not present

## 2020-11-23 DIAGNOSIS — Z1379 Encounter for other screening for genetic and chromosomal anomalies: Secondary | ICD-10-CM | POA: Insufficient documentation

## 2020-11-23 DIAGNOSIS — H409 Unspecified glaucoma: Secondary | ICD-10-CM | POA: Diagnosis not present

## 2020-11-23 MED ORDER — PEGFILGRASTIM-CBQV 6 MG/0.6ML ~~LOC~~ SOSY
6.0000 mg | PREFILLED_SYRINGE | Freq: Once | SUBCUTANEOUS | Status: AC
  Administered 2020-11-23: 6 mg via SUBCUTANEOUS

## 2020-11-23 NOTE — Patient Instructions (Signed)
[facility]  Discharge Instructions: Thank you for choosing [facility] to provide your oncology and hematology care.   If you have a lab appointment with the Cancer Center, please go directly to the Cancer Center and check in at the registration area.   Wear comfortable clothing and clothing appropriate for easy access to any Portacath or PICC line.   We strive to give you quality time with your provider. You may need to reschedule your appointment if you arrive late (15 or more minutes).  Arriving late affects you and other patients whose appointments are after yours.  Also, if you miss three or more appointments without notifying the office, you may be dismissed from the clinic at the provider's discretion.      For prescription refill requests, have your pharmacy contact our office and allow 72 hours for refills to be completed.    Today you received the following Udenyca   To help prevent nausea and vomiting after your treatment, we encourage you to take your nausea medication as directed.  BELOW ARE SYMPTOMS THAT SHOULD BE REPORTED IMMEDIATELY: *FEVER GREATER THAN 100.4 F (38 C) OR HIGHER *CHILLS OR SWEATING *NAUSEA AND VOMITING THAT IS NOT CONTROLLED WITH YOUR NAUSEA MEDICATION *UNUSUAL SHORTNESS OF BREATH *UNUSUAL BRUISING OR BLEEDING *URINARY PROBLEMS (pain or burning when urinating, or frequent urination) *BOWEL PROBLEMS (unusual diarrhea, constipation, pain near the anus) TENDERNESS IN MOUTH AND THROAT WITH OR WITHOUT PRESENCE OF ULCERS (sore throat, sores in mouth, or a toothache) UNUSUAL RASH, SWELLING OR PAIN  UNUSUAL VAGINAL DISCHARGE OR ITCHING   Items with * indicate a potential emergency and should be followed up as soon as possible or go to the Emergency Department if any problems should occur.  Please show the CHEMOTHERAPY ALERT CARD or IMMUNOTHERAPY ALERT CARD at check-in to the Emergency Department and triage  nurse.  Should you have questions after your visit or need to cancel or reschedule your appointment, please contact [facility]  Dept: [phone]  and follow the prompts.  Office hours are 8:00 a.m. to 4:30 p.m. Monday - Friday. Please note that voicemails left after 4:00 p.m. may not be returned until the following business day.  We are closed weekends and major holidays. You have access to a nurse at all times for urgent questions. Please call the main number to the clinic Dept: [phone] and follow the prompts.   For any non-urgent questions, you may also contact your provider using MyChart. We now offer e-Visits for anyone 18 and older to request care online for non-urgent symptoms. For details visit [email or web address].   Also download the MyChart app! Go to the app store, search "MyChart", open the app, select [place], and log in with your MyChart username and password.  Due to Covid, a mask is required upon entering the hospital/clinic. If you do not have a mask, one will be given to you upon arrival. For doctor visits, patients may have 1 support person aged 18 or older with them. For treatment visits, patients cannot have anyone with them due to current Covid guidelines and our immunocompromised population.   Pegfilgrastim injection What is this medication? PEGFILGRASTIM (PEG fil gra stim) is a long-acting granulocyte colony-stimulating factor that stimulates the growth of neutrophils, a type of white blood cell important in the body's fight against infection. It is used to reduce the incidence of fever and infection in patients with certain types of cancer who are   receiving chemotherapy that affects the bone marrow, and to increase survival after being exposed to high doses of radiation. This medicine may be used for other purposes; ask your health care provider or pharmacist if you have questions. COMMON BRAND NAME(S): Fulphila, Neulasta, Nyvepria,  UDENYCA, Ziextenzo What should I tell my care team before I take this medication? They need to know if you have any of these conditions: kidney disease latex allergy ongoing radiation therapy sickle cell disease skin reactions to acrylic adhesives (On-Body Injector only) an unusual or allergic reaction to pegfilgrastim, filgrastim, other medicines, foods, dyes, or preservatives pregnant or trying to get pregnant breast-feeding How should I use this medication? This medicine is for injection under the skin. If you get this medicine at home, you will be taught how to prepare and give the pre-filled syringe or how to use the On-body Injector. Refer to the patient Instructions for Use for detailed instructions. Use exactly as directed. Tell your healthcare provider immediately if you suspect that the On-body Injector may not have performed as intended or if you suspect the use of the On-body Injector resulted in a missed or partial dose. It is important that you put your used needles and syringes in a special sharps container. Do not put them in a trash can. If you do not have a sharps container, call your pharmacist or healthcare provider to get one. Talk to your pediatrician regarding the use of this medicine in children. While this drug may be prescribed for selected conditions, precautions do apply. Overdosage: If you think you have taken too much of this medicine contact a poison control center or emergency room at once. NOTE: This medicine is only for you. Do not share this medicine with others. What if I miss a dose? It is important not to miss your dose. Call your doctor or health care professional if you miss your dose. If you miss a dose due to an On-body Injector failure or leakage, a new dose should be administered as soon as possible using a single prefilled syringe for manual use. What may interact with this medication? Interactions have not been studied. This list may not describe all  possible interactions. Give your health care provider a list of all the medicines, herbs, non-prescription drugs, or dietary supplements you use. Also tell them if you smoke, drink alcohol, or use illegal drugs. Some items may interact with your medicine. What should I watch for while using this medication? Your condition will be monitored carefully while you are receiving this medicine. You may need blood work done while you are taking this medicine. Talk to your health care provider about your risk of cancer. You may be more at risk for certain types of cancer if you take this medicine. If you are going to need a MRI, CT scan, or other procedure, tell your doctor that you are using this medicine (On-Body Injector only). What side effects may I notice from receiving this medication? Side effects that you should report to your doctor or health care professional as soon as possible: allergic reactions (skin rash, itching or hives, swelling of the face, lips, or tongue) back pain dizziness fever pain, redness, or irritation at site where injected pinpoint red spots on the skin red or dark-brown urine shortness of breath or breathing problems stomach or side pain, or pain at the shoulder swelling tiredness trouble passing urine or change in the amount of urine unusual bruising or bleeding Side effects that usually do not require   medical attention (report to your doctor or health care professional if they continue or are bothersome): bone pain muscle pain This list may not describe all possible side effects. Call your doctor for medical advice about side effects. You may report side effects to FDA at 1-800-FDA-1088. Where should I keep my medication? Keep out of the reach of children. If you are using this medicine at home, you will be instructed on how to store it. Throw away any unused medicine after the expiration date on the label. NOTE: This sheet is a summary. It may not cover all  possible information. If you have questions about this medicine, talk to your doctor, pharmacist, or health care provider.  2022 Elsevier/Gold Standard (2020-02-10 11:54:14)  

## 2020-11-23 NOTE — Telephone Encounter (Signed)
I attempted to contact [name] to discuss his genetic testing results (84 genes). I left a voicemail requesting he call me back at [phone].  Lucille Passy, MS, [facility] Genetic Counselor [email or web address] (P) [phone]

## 2020-11-23 NOTE — Progress Notes (Signed)
24 Hour Callback  Pt here in infusion room for Udenyca injection. Pt 1st time Gemzar and Abraxane yesterday. Pt reports on side effects. Eating and drinking will. Just the usual fatigue.  Pt will call with any needs.

## 2020-11-26 ENCOUNTER — Telehealth: Payer: Self-pay | Admitting: Genetic Counselor

## 2020-11-26 ENCOUNTER — Encounter: Payer: Self-pay | Admitting: Genetic Counselor

## 2020-11-26 NOTE — Telephone Encounter (Signed)
I contacted Mr. Mucha to discuss his genetic testing results. No pathogenic variants were identified in the 84 genes analyzed. 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   The test report has been scanned into EPIC and is located under the Molecular Pathology section of the Results Review tab.  A portion of the result report is included below for reference. Detailed clinic note to follow.  Lucille Passy, MS, [facility] Genetic Counselor [email or web address] (P) [phone]

## 2020-11-26 NOTE — Telephone Encounter (Signed)
I attempted to contact Nathan Mora to discuss his genetic testing results (84 genes). I left a voicemail requesting he call me back at [phone].  Lucille Passy, MS, [facility] Genetic Counselor [email or web address] (P) [phone]

## 2020-11-27 ENCOUNTER — Encounter: Payer: Self-pay | Admitting: Oncology

## 2020-11-29 ENCOUNTER — Ambulatory Visit: Payer: Medicare HMO | Admitting: Family Medicine

## 2020-12-01 ENCOUNTER — Other Ambulatory Visit: Payer: Self-pay | Admitting: Oncology

## 2020-12-03 ENCOUNTER — Encounter: Payer: Self-pay | Admitting: Oncology

## 2020-12-03 ENCOUNTER — Ambulatory Visit: Payer: Self-pay | Admitting: Genetic Counselor

## 2020-12-03 DIAGNOSIS — C250 Malignant neoplasm of head of pancreas: Secondary | ICD-10-CM

## 2020-12-03 DIAGNOSIS — Z1379 Encounter for other screening for genetic and chromosomal anomalies: Secondary | ICD-10-CM

## 2020-12-03 NOTE — Progress Notes (Signed)
HPI:   Nathan Mora was previously seen in the [place] clinic due to a personal and family history of cancer and concerns regarding a hereditary predisposition to cancer. Please refer to our prior cancer genetics clinic note for more information regarding our discussion, assessment and recommendations, at the time. Nathan Mora recent genetic test results were disclosed to him, as were recommendations warranted by these results. These results and recommendations are discussed in more detail below.   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 Report date is 11/13/2020.  The Multi-Cancer Panel offered by [facility] includes sequencing and/or deletion/duplication analysis of the following 84 genes:  AIP, ALK, APC, ATM, AXIN2, BAP1, BARD1, BLM, BMPR1A, BRCA1, BRCA2, BRIP1, CASR, CDC73, CDH1, CDK4, CDKN1B, CDKN1C, CDKN2A, CEBPA, CHEK2, CTNNA1, DICER1, DIS3L2, EGFR, EPCAM, FH, FLCN, GATA2, GPC3, GREM1, HOXB13, HRAS, KIT, MAX, MEN1, MET, MITF, MLH1, MSH2, MSH3, MSH6, MUTYH, NBN, NF1, NF2, NTHL1, PALB2, PDGFRA, PHOX2B, PMS2, POLD1, POLE, POT1, PRKAR1A, PTCH1, PTEN, RAD50, RAD51C, RAD51D, RB1, RECQL4, RET, RUNX1, SDHA, SDHAF2, SDHB, SDHC, SDHD, SMAD4, SMARCA4, SMARCB1, SMARCE1, STK11, SUFU, TERC, TERT, TMEM127, TP53, TSC1, TSC2,  VHL, WRN, and WT1.       FAMILY HISTORY:  We obtained a detailed, 4-generation family history.  Significant diagnoses are listed below:        Family History  Problem Relation Age of Onset   Lung cancer Father     Kidney cancer Brother 71        Nathan Mora brother has a history of kidney cancer diagnosed at 43 and his father has a history of lung cancer diagnosed in his 57s (he smoked). Nathan Mora is unaware of previous family history of genetic testing for hereditary cancer risks. There no reported Ashkenazi Jewish ancestry.   GENETIC TEST RESULTS:  The [facility] Multi-Cancer Panel found no pathogenic mutations.   The Multi-Cancer Panel offered by [facility] includes sequencing and/or deletion/duplication analysis of the following 84 genes:  AIP, ALK, APC, ATM, AXIN2, BAP1, BARD1, BLM, BMPR1A, BRCA1, BRCA2, BRIP1, CASR, CDC73, CDH1, CDK4, CDKN1B, CDKN1C, CDKN2A, CEBPA, CHEK2, CTNNA1, DICER1, DIS3L2, EGFR, EPCAM, FH, FLCN, GATA2, GPC3, GREM1, HOXB13, HRAS, KIT, MAX, MEN1, MET, MITF, MLH1, MSH2, MSH3, MSH6, MUTYH, NBN, NF1, NF2, NTHL1, PALB2, PDGFRA, PHOX2B, PMS2, POLD1, POLE, POT1, PRKAR1A, PTCH1, PTEN, RAD50, RAD51C, RAD51D, RB1, RECQL4, RET, RUNX1, SDHA, SDHAF2, SDHB, SDHC, SDHD, SMAD4, SMARCA4, SMARCB1, SMARCE1, STK11, SUFU, TERC, TERT, TMEM127, TP53, TSC1, TSC2, VHL, WRN, and WT1.    The test report has been scanned into EPIC and is located under the Molecular Pathology section of the Results Review tab.  A portion of the result report is included below for reference. Genetic testing reported out on 11/13/2020.      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  At this time, it is unknown if this variant is associated with an increased risk for cancer or if it is benign, but most uncertain variants are reclassified to benign. It should not be used to make medical management decisions. With time, we suspect the laboratory will  determine the significance of this variant, if any. If the laboratory reclassifies this variant, we will attempt to contact Nathan Mora to discuss it further.   Even though a pathogenic variant was not identified, possible explanations for the cancer in the family may include: There may be no hereditary risk for cancer in the family. The cancers in Nathan Mora and/or his family may be due to other genetic or environmental factors. There may be a gene mutation in one of these genes that current testing methods cannot detect, but that chance is small. There could be another gene that has not yet been discovered, or that we have not yet tested, that is responsible for the cancer diagnoses in the family.  It is also possible there is a hereditary cause for the cancer in the family that Nathan Mora did not inherit.  Therefore, it is important to remain in touch with cancer genetics in the future so that we can continue to offer Nathan Mora the most up to date genetic testing.   ADDITIONAL GENETIC TESTING:  We discussed with Nathan Mora that his genetic testing was fairly extensive.  If there are genes identified to increase cancer risk that can be analyzed in the future, we would be happy to discuss and coordinate this testing at that time.    CANCER SCREENING RECOMMENDATIONS:  Nathan Mora test result is considered negative (normal). This means that we have not identified a hereditary cause for his personal and family history of cancer at this time.  An individual's cancer risk and medical management are not determined by genetic test results alone. Overall cancer risk assessment incorporates additional factors, including personal medical history, family history, and any available genetic information that may result in a personalized plan for cancer prevention and surveillance. Therefore, it is recommended he continue to follow the cancer management and screening guidelines provided by his oncology and primary  healthcare provider.  RECOMMENDATIONS FOR FAMILY MEMBERS:   Since he did not inherit a mutation in a cancer predisposition gene included on this panel, his son could not have inherited a mutation from him in one of these genes.  We do not recommend familial testing for the BAP1 variant of uncertain significance (VUS).  FOLLOW-UP:  Cancer genetics is a rapidly advancing field and it is possible that new genetic tests will be appropriate for him and/or his family members in the future. We encouraged him to remain in contact with cancer genetics on an annual basis so we can update his personal and family histories and let him know of advances in cancer genetics that may benefit this family.   Our contact number was provided. Nathan Mora questions were answered to his satisfaction, and he knows he is welcome to call us at anytime with additional questions or concerns.   Lucille Passy, MS, [facility] Genetic Counselor [email or web address] (P) [phone]

## 2020-12-06 ENCOUNTER — Inpatient Hospital Stay (HOSPITAL_BASED_OUTPATIENT_CLINIC_OR_DEPARTMENT_OTHER): Payer: Medicare HMO | Admitting: Nurse Practitioner

## 2020-12-06 ENCOUNTER — Ambulatory Visit: Payer: Medicare HMO | Attending: Internal Medicine

## 2020-12-06 ENCOUNTER — Inpatient Hospital Stay: Payer: Medicare HMO

## 2020-12-06 ENCOUNTER — Inpatient Hospital Stay: Payer: Medicare HMO | Attending: Oncology

## 2020-12-06 ENCOUNTER — Encounter: Payer: Self-pay | Admitting: Nurse Practitioner

## 2020-12-06 ENCOUNTER — Other Ambulatory Visit: Payer: Self-pay

## 2020-12-06 ENCOUNTER — Encounter: Payer: Self-pay | Admitting: Oncology

## 2020-12-06 ENCOUNTER — Other Ambulatory Visit (HOSPITAL_BASED_OUTPATIENT_CLINIC_OR_DEPARTMENT_OTHER): Payer: Self-pay

## 2020-12-06 VITALS — BP 117/71 | HR 66 | Temp 98.1°F | Resp 18 | Ht 68.0 in | Wt 169.8 lb

## 2020-12-06 DIAGNOSIS — Z23 Encounter for immunization: Secondary | ICD-10-CM

## 2020-12-06 DIAGNOSIS — E119 Type 2 diabetes mellitus without complications: Secondary | ICD-10-CM | POA: Diagnosis not present

## 2020-12-06 DIAGNOSIS — Z5189 Encounter for other specified aftercare: Secondary | ICD-10-CM | POA: Insufficient documentation

## 2020-12-06 DIAGNOSIS — Z79899 Other long term (current) drug therapy: Secondary | ICD-10-CM | POA: Diagnosis not present

## 2020-12-06 DIAGNOSIS — C250 Malignant neoplasm of head of pancreas: Secondary | ICD-10-CM | POA: Diagnosis not present

## 2020-12-06 DIAGNOSIS — E785 Hyperlipidemia, unspecified: Secondary | ICD-10-CM | POA: Insufficient documentation

## 2020-12-06 DIAGNOSIS — C259 Malignant neoplasm of pancreas, unspecified: Secondary | ICD-10-CM

## 2020-12-06 DIAGNOSIS — Z5111 Encounter for antineoplastic chemotherapy: Secondary | ICD-10-CM | POA: Insufficient documentation

## 2020-12-06 DIAGNOSIS — D696 Thrombocytopenia, unspecified: Secondary | ICD-10-CM | POA: Diagnosis not present

## 2020-12-06 LAB — CBC WITH DIFFERENTIAL (CANCER CENTER ONLY)
Abs Immature Granulocytes: 1.24 10*3/uL — ABNORMAL HIGH (ref 0.00–0.07)
Basophils Absolute: 0.1 10*3/uL (ref 0.0–0.1)
Basophils Relative: 1 %
Eosinophils Absolute: 0.1 10*3/uL (ref 0.0–0.5)
Eosinophils Relative: 0 %
HCT: 37.2 % — ABNORMAL LOW (ref 39.0–52.0)
Hemoglobin: 12 g/dL — ABNORMAL LOW (ref 13.0–17.0)
Immature Granulocytes: 9 %
Lymphocytes Relative: 25 %
Lymphs Abs: 3.5 10*3/uL (ref 0.7–4.0)
MCH: 29.6 pg (ref 26.0–34.0)
MCHC: 32.3 g/dL (ref 30.0–36.0)
MCV: 91.6 fL (ref 80.0–100.0)
Monocytes Absolute: 0.8 10*3/uL (ref 0.1–1.0)
Monocytes Relative: 5 %
Neutro Abs: 8.6 10*3/uL — ABNORMAL HIGH (ref 1.7–7.7)
Neutrophils Relative %: 60 %
Platelet Count: 98 10*3/uL — ABNORMAL LOW (ref 150–400)
RBC: 4.06 MIL/uL — ABNORMAL LOW (ref 4.22–5.81)
RDW: 15.1 % (ref 11.5–15.5)
WBC Count: 14.2 10*3/uL — ABNORMAL HIGH (ref 4.0–10.5)
nRBC: 0 % (ref 0.0–0.2)

## 2020-12-06 LAB — CMP (CANCER CENTER ONLY)
ALT: 13 U/L (ref 0–44)
AST: 14 U/L — ABNORMAL LOW (ref 15–41)
Albumin: 4.1 g/dL (ref 3.5–5.0)
Alkaline Phosphatase: 133 U/L — ABNORMAL HIGH (ref 38–126)
Anion gap: 9 (ref 5–15)
BUN: 17 mg/dL (ref 8–23)
CO2: 26 mmol/L (ref 22–32)
Calcium: 8.5 mg/dL — ABNORMAL LOW (ref 8.9–10.3)
Chloride: 103 mmol/L (ref 98–111)
Creatinine: 0.56 mg/dL — ABNORMAL LOW (ref 0.61–1.24)
GFR, Estimated: >60 mL/min (ref >60)
Glucose, Bld: 173 mg/dL — ABNORMAL HIGH (ref 70–99)
Potassium: 4.1 mmol/L (ref 3.5–5.1)
Sodium: 138 mmol/L (ref 135–145)
Total Bilirubin: 0.7 mg/dL (ref 0.3–1.2)
Total Protein: 6.3 g/dL — ABNORMAL LOW (ref 6.5–8.1)

## 2020-12-06 MED ORDER — SODIUM CHLORIDE 0.9 % IV SOLN: Freq: Once | INTRAVENOUS | Status: AC

## 2020-12-06 MED ORDER — SODIUM CHLORIDE 0.9% FLUSH
10.0000 mL | INTRAVENOUS | Status: DC | PRN
  Administered 2020-12-06: 10 mL

## 2020-12-06 MED ORDER — PFIZER COVID-19 VAC BIVALENT 30 MCG/0.3ML IM SUSP
INTRAMUSCULAR | 0 refills | Status: DC
Start: 2020-12-06 — End: 2022-04-03
  Filled 2020-12-06: qty 0.3, 1d supply, fill #0

## 2020-12-06 MED ORDER — PROCHLORPERAZINE MALEATE 10 MG PO TABS
10.0000 mg | ORAL_TABLET | Freq: Once | ORAL | Status: AC
  Administered 2020-12-06: 10 mg via ORAL
  Filled 2020-12-06: qty 1

## 2020-12-06 MED ORDER — SODIUM CHLORIDE 0.9 % IV SOLN
800.0000 mg/m2 | Freq: Once | INTRAVENOUS | Status: AC
  Administered 2020-12-06: 1520 mg via INTRAVENOUS
  Filled 2020-12-06: qty 26.3

## 2020-12-06 MED ORDER — HEPARIN SOD (PORK) LOCK FLUSH 100 UNIT/ML IV SOLN
500.0000 [IU] | Freq: Once | INTRAVENOUS | Status: AC | PRN
  Administered 2020-12-06: 500 [IU]

## 2020-12-06 MED ORDER — PACLITAXEL PROTEIN-BOUND CHEMO INJECTION 100 MG
100.0000 mg/m2 | Freq: Once | INTRAVENOUS | Status: AC
  Administered 2020-12-06: 200 mg via INTRAVENOUS
  Filled 2020-12-06: qty 40

## 2020-12-06 NOTE — Progress Notes (Signed)
Patient presents for treatment. RN assessment completed along with the following:  Labs/vitals reviewed - Yes, and Per [name], ok to treat with platelets 98.    Weight within 10% of previous measurement - Yes Oncology Treatment Attestation completed for current therapy- Yes, on date 10/25/20 Informed consent completed and reflects current therapy/intent - Yes, on date 11/22/20             Provider progress note reviewed - Today's provider note is not yet available. I reviewed the most recent oncology provider progress note in chart dated 11/22/20. Treatment/Antibody/Supportive plan reviewed - Yes, and there are no adjustments needed for today's treatment. S&H and other orders reviewed - Yes, and there are no additional orders identified. Previous treatment date reviewed - Yes, and the appropriate amount of time has elapsed between treatments. Clinic Hand Off Received from - [name], NP  Patient to proceed with treatment.

## 2020-12-06 NOTE — Progress Notes (Signed)
   Covid-19 Vaccination Clinic  Name:  Nathan Mora    MRN: [id number] DOB: July 16, 1945  12/06/2020  Nathan Mora was observed post Covid-19 immunization for 15 minutes without incident. He was provided with Vaccine Information Sheet and instruction to access the V-Safe system.   Nathan Mora was instructed to call [phone] with any severe reactions post vaccine: Difficulty breathing  Swelling of face and throat  A fast heartbeat  A bad rash all over body  Dizziness and weakness   Immunizations Administered     Name Date Dose VIS Date Route   Pfizer Covid-19 Vaccine Bivalent Booster 12/06/2020 11:18 AM 0.3 mL 09/26/2020 Intramuscular   Manufacturer: [place]   Lot: [id number]   [place]: [phone]

## 2020-12-06 NOTE — Patient Instructions (Signed)

## 2020-12-06 NOTE — Patient Instructions (Signed)
[facility]   Discharge Instructions: Thank you for choosing [facility] to provide your oncology and hematology care.   If you have a lab appointment with the [facility], please go directly to the [facility] and check in at the registration area.   Wear comfortable clothing and clothing appropriate for easy access to any Portacath or PICC line.   We strive to give you quality time with your provider. You may need to reschedule your appointment if you arrive late (15 or more minutes).  Arriving late affects you and other patients whose appointments are after yours.  Also, if you miss three or more appointments without notifying the office, you may be dismissed from the clinic at the provider's discretion.      For prescription refill requests, have your pharmacy contact our office and allow 72 hours for refills to be completed.    Today you received the following chemotherapy and/or immunotherapy agents Paclitaxel-protein bound (ABRAXANE) & Gemcitabine (GEMZAR).      To help prevent nausea and vomiting after your treatment, we encourage you to take your nausea medication as directed.  BELOW ARE SYMPTOMS THAT SHOULD BE REPORTED IMMEDIATELY: *FEVER GREATER THAN 100.4 F (38 C) OR HIGHER *CHILLS OR SWEATING *NAUSEA AND VOMITING THAT IS NOT CONTROLLED WITH YOUR NAUSEA MEDICATION *UNUSUAL SHORTNESS OF BREATH *UNUSUAL BRUISING OR BLEEDING *URINARY PROBLEMS (pain or burning when urinating, or frequent urination) *BOWEL PROBLEMS (unusual diarrhea, constipation, pain near the anus) TENDERNESS IN MOUTH AND THROAT WITH OR WITHOUT PRESENCE OF ULCERS (sore throat, sores in mouth, or a toothache) UNUSUAL RASH, SWELLING OR PAIN  UNUSUAL VAGINAL DISCHARGE OR ITCHING   Items with * indicate a potential emergency and should be followed up as soon as possible or go to the Emergency Department if any problems should occur.  Please show the CHEMOTHERAPY ALERT  CARD or IMMUNOTHERAPY ALERT CARD at check-in to the Emergency Department and triage nurse.  Should you have questions after your visit or need to cancel or reschedule your appointment, please contact [facility]  Dept: [phone]  and follow the prompts.  Office hours are 8:00 a.m. to 4:30 p.m. Monday - Friday. Please note that voicemails left after 4:00 p.m. may not be returned until the following business day.  We are closed weekends and major holidays. You have access to a nurse at all times for urgent questions. Please call the main number to the clinic Dept: [phone] and follow the prompts.   For any non-urgent questions, you may also contact your provider using MyChart. We now offer e-Visits for anyone 81 and older to request care online for non-urgent symptoms. For details visit [email or web address].   Also download the MyChart app! Go to the app store, search "MyChart", open the app, select [facility], and log in with your MyChart username and password.  Due to Covid, a mask is required upon entering the hospital/clinic. If you do not have a mask, one will be given to you upon arrival. For doctor visits, patients may have 1 support person aged 103 or older with them. For treatment visits, patients cannot have anyone with them due to current Covid guidelines and our immunocompromised population.   Nanoparticle Albumin-Bound Paclitaxel injection What is this medication? NANOPARTICLE ALBUMIN-BOUND PACLITAXEL (Na no PAHR ti kuhl al BYOO muhn-bound PAK li TAX el) is a chemotherapy drug. It targets fast dividing cells, like cancer cells, and causes these cells to die. This medicine  is used to treat advanced breast cancer, lung cancer, and pancreatic cancer. This medicine may be used for other purposes; ask your health care provider or pharmacist if you have questions. COMMON BRAND NAME(S): Abraxane What should I tell my care team before I take this  medication? They need to know if you have any of these conditions: kidney disease liver disease low blood counts, like low white cell, platelet, or red cell counts lung or breathing disease, like asthma tingling of the fingers or toes, or other nerve disorder an unusual or allergic reaction to paclitaxel, albumin, other chemotherapy, other medicines, foods, dyes, or preservatives pregnant or trying to get pregnant breast-feeding How should I use this medication? This drug is given as an infusion into a vein. It is administered in a hospital or clinic by a specially trained health care professional. Talk to your pediatrician regarding the use of this medicine in children. Special care may be needed. Overdosage: If you think you have taken too much of this medicine contact a poison control center or emergency room at once. NOTE: This medicine is only for you. Do not share this medicine with others. What if I miss a dose? It is important not to miss your dose. Call your doctor or health care professional if you are unable to keep an appointment. What may interact with this medication? This medicine may interact with the following medications: antiviral medicines for hepatitis, HIV or AIDS certain antibiotics like erythromycin and clarithromycin certain medicines for fungal infections like ketoconazole and itraconazole certain medicines for seizures like carbamazepine, phenobarbital, phenytoin gemfibrozil nefazodone rifampin St. John's wort This list may not describe all possible interactions. Give your health care provider a list of all the medicines, herbs, non-prescription drugs, or dietary supplements you use. Also tell them if you smoke, drink alcohol, or use illegal drugs. Some items may interact with your medicine. What should I watch for while using this medication? Your condition will be monitored carefully while you are receiving this medicine. You will need important blood work  done while you are taking this medicine. This medicine can cause serious allergic reactions. If you experience allergic reactions like skin rash, itching or hives, swelling of the face, lips, or tongue, tell your doctor or health care professional right away. In some cases, you may be given additional medicines to help with side effects. Follow all directions for their use. This drug may make you feel generally unwell. This is not uncommon, as chemotherapy can affect healthy cells as well as cancer cells. Report any side effects. Continue your course of treatment even though you feel ill unless your doctor tells you to stop. Call your doctor or health care professional for advice if you get a fever, chills or sore throat, or other symptoms of a cold or flu. Do not treat yourself. This drug decreases your body's ability to fight infections. Try to avoid being around people who are sick. This medicine may increase your risk to bruise or bleed. Call your doctor or health care professional if you notice any unusual bleeding. Be careful brushing and flossing your teeth or using a toothpick because you may get an infection or bleed more easily. If you have any dental work done, tell your dentist you are receiving this medicine. Avoid taking products that contain aspirin, acetaminophen, ibuprofen, naproxen, or ketoprofen unless instructed by your doctor. These medicines may hide a fever. Do not become pregnant while taking this medicine or for 6 months after stopping it.  Women should inform their doctor if they wish to become pregnant or think they might be pregnant. Men should not father a child while taking this medicine or for 3 months after stopping it. There is a potential for serious side effects to an unborn child. Talk to your health care professional or pharmacist for more information. Do not breast-feed an infant while taking this medicine or for 2 weeks after stopping it. This medicine may interfere  with the ability to get pregnant or to father a child. You should talk to your doctor or health care professional if you are concerned about your fertility. What side effects may I notice from receiving this medication? Side effects that you should report to your doctor or health care professional as soon as possible: allergic reactions like skin rash, itching or hives, swelling of the face, lips, or tongue breathing problems changes in vision fast, irregular heartbeat low blood pressure mouth sores pain, tingling, numbness in the hands or feet signs of decreased platelets or bleeding - bruising, pinpoint red spots on the skin, black, tarry stools, blood in the urine signs of decreased red blood cells - unusually weak or tired, feeling faint or lightheaded, falls signs of infection - fever or chills, cough, sore throat, pain or difficulty passing urine signs and symptoms of liver injury like dark yellow or brown urine; general ill feeling or flu-like symptoms; light-colored stools; loss of appetite; nausea; right upper belly pain; unusually weak or tired; yellowing of the eyes or skin swelling of the ankles, feet, hands unusually slow heartbeat Side effects that usually do not require medical attention (report to your doctor or health care professional if they continue or are bothersome): diarrhea hair loss loss of appetite nausea, vomiting tiredness This list may not describe all possible side effects. Call your doctor for medical advice about side effects. You may report side effects to FDA at 1-800-FDA-1088. Where should I keep my medication? This drug is given in a hospital or clinic and will not be stored at home. NOTE: This sheet is a summary. It may not cover all possible information. If you have questions about this medicine, talk to your doctor, pharmacist, or health care provider.  2022 Elsevier/Gold Standard (2016-09-16 13:03:45)  Gemcitabine injection What is this  medication? GEMCITABINE (jem SYE ta been) is a chemotherapy drug. This medicine is used to treat many types of cancer like breast cancer, lung cancer, pancreatic cancer, and ovarian cancer. This medicine may be used for other purposes; ask your health care provider or pharmacist if you have questions. COMMON BRAND NAME(S): Gemzar, Infugem What should I tell my care team before I take this medication? They need to know if you have any of these conditions: blood disorders infection kidney disease liver disease lung or breathing disease, like asthma recent or ongoing radiation therapy an unusual or allergic reaction to gemcitabine, other chemotherapy, other medicines, foods, dyes, or preservatives pregnant or trying to get pregnant breast-feeding How should I use this medication? This drug is given as an infusion into a vein. It is administered in a hospital or clinic by a specially trained health care professional. Talk to your pediatrician regarding the use of this medicine in children. Special care may be needed. Overdosage: If you think you have taken too much of this medicine contact a poison control center or emergency room at once. NOTE: This medicine is only for you. Do not share this medicine with others. What if I miss a dose? It is  important not to miss your dose. Call your doctor or health care professional if you are unable to keep an appointment. What may interact with this medication? medicines to increase blood counts like filgrastim, pegfilgrastim, sargramostim some other chemotherapy drugs like cisplatin vaccines Talk to your doctor or health care professional before taking any of these medicines: acetaminophen aspirin ibuprofen ketoprofen naproxen This list may not describe all possible interactions. Give your health care provider a list of all the medicines, herbs, non-prescription drugs, or dietary supplements you use. Also tell them if you smoke, drink alcohol, or  use illegal drugs. Some items may interact with your medicine. What should I watch for while using this medication? Visit your doctor for checks on your progress. This drug may make you feel generally unwell. This is not uncommon, as chemotherapy can affect healthy cells as well as cancer cells. Report any side effects. Continue your course of treatment even though you feel ill unless your doctor tells you to stop. In some cases, you may be given additional medicines to help with side effects. Follow all directions for their use. Call your doctor or health care professional for advice if you get a fever, chills or sore throat, or other symptoms of a cold or flu. Do not treat yourself. This drug decreases your body's ability to fight infections. Try to avoid being around people who are sick. This medicine may increase your risk to bruise or bleed. Call your doctor or health care professional if you notice any unusual bleeding. Be careful brushing and flossing your teeth or using a toothpick because you may get an infection or bleed more easily. If you have any dental work done, tell your dentist you are receiving this medicine. Avoid taking products that contain aspirin, acetaminophen, ibuprofen, naproxen, or ketoprofen unless instructed by your doctor. These medicines may hide a fever. Do not become pregnant while taking this medicine or for 6 months after stopping it. Women should inform their doctor if they wish to become pregnant or think they might be pregnant. Men should not father a child while taking this medicine and for 3 months after stopping it. There is a potential for serious side effects to an unborn child. Talk to your health care professional or pharmacist for more information. Do not breast-feed an infant while taking this medicine or for at least 1 week after stopping it. Men should inform their doctors if they wish to father a child. This medicine may lower sperm counts. Talk with your  doctor or health care professional if you are concerned about your fertility. What side effects may I notice from receiving this medication? Side effects that you should report to your doctor or health care professional as soon as possible: allergic reactions like skin rash, itching or hives, swelling of the face, lips, or tongue breathing problems pain, redness, or irritation at site where injected signs and symptoms of a dangerous change in heartbeat or heart rhythm like chest pain; dizziness; fast or irregular heartbeat; palpitations; feeling faint or lightheaded, falls; breathing problems signs of decreased platelets or bleeding - bruising, pinpoint red spots on the skin, black, tarry stools, blood in the urine signs of decreased red blood cells - unusually weak or tired, feeling faint or lightheaded, falls signs of infection - fever or chills, cough, sore throat, pain or difficulty passing urine signs and symptoms of kidney injury like trouble passing urine or change in the amount of urine signs and symptoms of liver injury like dark  yellow or brown urine; general ill feeling or flu-like symptoms; light-colored stools; loss of appetite; nausea; right upper belly pain; unusually weak or tired; yellowing of the eyes or skin swelling of ankles, feet, hands Side effects that usually do not require medical attention (report to your doctor or health care professional if they continue or are bothersome): constipation diarrhea hair loss loss of appetite nausea rash vomiting This list may not describe all possible side effects. Call your doctor for medical advice about side effects. You may report side effects to FDA at 1-800-FDA-1088. Where should I keep my medication? This drug is given in a hospital or clinic and will not be stored at home. NOTE: This sheet is a summary. It may not cover all possible information. If you have questions about this medicine, talk to your doctor, pharmacist, or  health care provider.  2022 Elsevier/Gold Standard (2017-04-08 18:06:11)

## 2020-12-06 NOTE — Progress Notes (Signed)
[facility] OFFICE PROGRESS NOTE   Diagnosis:  Pancreas cancer  INTERVAL HISTORY:   Nathan Mora returns as scheduled.  He completed cycle 1 gemcitabine/Abraxane 11/22/2020.  He denies nausea/vomiting.  No mouth sores.  No diarrhea.  No fever or rash after treatment.  He noted more fatigue lasting a longer period of time then with the previous chemotherapy.  Objective:  Vital signs in last 24 hours:  Blood pressure 117/71, pulse 66, temperature 98.1 F (36.7 C), temperature source Oral, resp. rate 18, height _0  (1.727 m), weight 169 lb 12.8 oz (77 kg), SpO2 100 %.    HEENT: No thrush or ulcers. Resp: Lungs clear bilaterally. Cardio: Regular rate and rhythm. GI: Abdomen soft and nontender.  No hepatosplenomegaly.  No mass. Vascular: No leg edema.  Calves soft and nontender. Skin: No rash. Port-A-Cath without erythema.   Lab Results:  Lab Results  Component Value Date   WBC 14.2 (H) 12/06/2020   HGB 12.0 (L) 12/06/2020   HCT 37.2 (L) 12/06/2020   MCV 91.6 12/06/2020   PLT 98 (L) 12/06/2020   NEUTROABS PENDING 12/06/2020    Imaging:  No results found.  Medications: I have reviewed the patient's current medications.  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  Fat planes around the celiac axis preserved.  Less than 90 degree contact of the lateral wall of the SMA, no  evidence of metastatic disease, 3 mm left lower lobe pulmonary nodule Cycle 1 FOLFOX 05/09/2020 (plan to add irinotecan with cycle 2 pending tolerance) Cycle 2 FOLFIRINOX 05/23/2020 Cycle 3 FOLFIRINOX 06/06/2020, oxaliplatin dose reduced secondary to thrombocytopenia Cycle 4 FOLFIRINOX 06/27/2020, oxaliplatin further dose reduced secondary to thrombocytopenia CTs at [facility] 07/03/2020-redemonstration of a heterogeneous pancreatic head mass with borderline encasement of the SMA as well as abutment of the portal vein, SMV and likely the IVC.  Similar mildly enlarged porta hepatis and pericaval lymph nodes.  No convincing evidence of distant metastatic disease. 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  More than 180 degree involvement of the SMV and SMA Cycle 1 gemcitabine/Abraxane 11/22/2020, [facility] counselor contacted him 11/26/2020-no pathogenic variants identified in the 84 genes analyzed.  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 Cycle 2 gemcitabine/Abraxane 12/06/2020, [place] Obstructive jaundice secondary #1 ERCP 04/11/2020-prominent and floppy major papilla, dilated bile duct secondary to a distal stricture, placement of an uncovered metal stent, bile duct brushings-reactive glandular cells 3.   Diabetes   4.   Hyperlipidemia   5.   Glaucoma   6.   Port-A-Cath  placement 05/04/2020 Interventional Radiology   7.   Mild  thrombocytopenia (121,000) 05/09/2020    Disposition: Nathan Mora appears stable.  He has completed 1 cycle of gemcitabine/Abraxane.  Plan to proceed with cycle 2 today as scheduled pending complete lab results.  We reviewed the CBC and chemistry panel from today.  Labs adequate to proceed with treatment.  He has stable mild/moderate thrombocytopenia.  He will return for lab, follow-up, cycle 3 gemcitabine/Abraxane in 2 weeks.  We are available to see him sooner if needed.    Elroy Schembri ANP/GNP-BC   12/06/2020  8:47 AM

## 2020-12-07 ENCOUNTER — Inpatient Hospital Stay: Payer: Medicare HMO

## 2020-12-07 VITALS — BP 124/73 | HR 77 | Temp 97.9°F | Resp 18

## 2020-12-07 DIAGNOSIS — Z79899 Other long term (current) drug therapy: Secondary | ICD-10-CM | POA: Diagnosis not present

## 2020-12-07 DIAGNOSIS — C250 Malignant neoplasm of head of pancreas: Secondary | ICD-10-CM | POA: Diagnosis not present

## 2020-12-07 DIAGNOSIS — E119 Type 2 diabetes mellitus without complications: Secondary | ICD-10-CM | POA: Diagnosis not present

## 2020-12-07 DIAGNOSIS — Z5189 Encounter for other specified aftercare: Secondary | ICD-10-CM | POA: Diagnosis not present

## 2020-12-07 DIAGNOSIS — Z5111 Encounter for antineoplastic chemotherapy: Secondary | ICD-10-CM | POA: Diagnosis not present

## 2020-12-07 DIAGNOSIS — D696 Thrombocytopenia, unspecified: Secondary | ICD-10-CM | POA: Diagnosis not present

## 2020-12-07 DIAGNOSIS — E785 Hyperlipidemia, unspecified: Secondary | ICD-10-CM | POA: Diagnosis not present

## 2020-12-07 MED ORDER — PEGFILGRASTIM-CBQV 6 MG/0.6ML ~~LOC~~ SOSY
6.0000 mg | PREFILLED_SYRINGE | Freq: Once | SUBCUTANEOUS | Status: AC
  Administered 2020-12-07: 6 mg via SUBCUTANEOUS

## 2020-12-07 NOTE — Patient Instructions (Signed)
[facility]  Discharge Instructions: Thank you for choosing [facility] to provide your oncology and hematology care.   If you have a lab appointment with the [facility], please go directly to the [facility] and check in at the registration area.   Wear comfortable clothing and clothing appropriate for easy access to any Portacath or PICC line.   We strive to give you quality time with your provider. You may need to reschedule your appointment if you arrive late (15 or more minutes).  Arriving late affects you and other patients whose appointments are after yours.  Also, if you miss three or more appointments without notifying the office, you may be dismissed from the clinic at the provider's discretion.      For prescription refill requests, have your pharmacy contact our office and allow 72 hours for refills to be completed.    Today you received the following chemotherapy and/or immunotherapy agents: udenyca  Pegfilgrastim Injection What is this medication? PEGFILGRASTIM (PEG fil gra stim) lowers the risk of infection in people who are receiving chemotherapy. It works by Building control surveyor make more white blood cells, which protects your body from infection. It may also be used to help people who have been exposed to high doses of radiation. This medicine may be used for other purposes; ask your health care provider or pharmacist if you have questions. COMMON BRAND NAME(S): Rexene Edison, Ziextenzo What should I tell my care team before I take this medication? They need to know if you have any of these conditions: Kidney disease Latex allergy Ongoing radiation therapy Sickle cell disease Skin reactions to acrylic adhesives (On-Body Injector only) An unusual or allergic reaction to pegfilgrastim, filgrastim, other medications, foods, dyes, or preservatives Pregnant or trying to get pregnant Breast-feeding How should  I use this medication? This medication is for injection under the skin. If you get this medication at home, you will be taught how to prepare and give the pre-filled syringe or how to use the On-body Injector. Refer to the patient Instructions for Use for detailed instructions. Use exactly as directed. Tell your care team immediately if you suspect that the On-body Injector may not have performed as intended or if you suspect the use of the On-body Injector resulted in a missed or partial dose. It is important that you put your used needles and syringes in a special sharps container. Do not put them in a trash can. If you do not have a sharps container, call your pharmacist or care team to get one. Talk to your care team about the use of this medication in children. While this medication may be prescribed for selected conditions, precautions do apply. Overdosage: If you think you have taken too much of this medicine contact a poison control center or emergency room at once. NOTE: This medicine is only for you. Do not share this medicine with others. What if I miss a dose? It is important not to miss your dose. Call your care team if you miss your dose. If you miss a dose due to an On-body Injector failure or leakage, a new dose should be administered as soon as possible using a single prefilled syringe for manual use. What may interact with this medication? Interactions have not been studied. This list may not describe all possible interactions. Give your health care provider a list of all the medicines, herbs, non-prescription drugs, or dietary supplements you use. Also tell  them if you smoke, drink alcohol, or use illegal drugs. Some items may interact with your medicine. What should I watch for while using this medication? Your condition will be monitored carefully while you are receiving this medication. You may need blood work done while you are taking this medication. Talk to your care team about  your risk of cancer. You may be more at risk for certain types of cancer if you take this medication. If you are going to need a MRI, CT scan, or other procedure, tell your care team that you are using this medication (On-Body Injector only). What side effects may I notice from receiving this medication? Side effects that you should report to your care team as soon as possible: Allergic reactions--skin rash, itching, hives, swelling of the face, lips, tongue, or throat Capillary leak syndrome--stomach or muscle pain, unusual weakness or fatigue, feeling faint or lightheaded, decrease in the amount of urine, swelling of the ankles, hands, or feet, trouble breathing High white blood cell level--fever, fatigue, trouble breathing, night sweats, change in vision, weight loss Inflammation of the aorta--fever, fatigue, back, chest, or stomach pain, severe headache Kidney injury (glomerulonephritis)--decrease in the amount of urine, red or dark brown urine, foamy or bubbly urine, swelling of the ankles, hands, or feet Shortness of breath or trouble breathing Spleen injury--pain in upper left stomach or shoulder Unusual bruising or bleeding Side effects that usually do not require medical attention (report to your care team if they continue or are bothersome): Bone pain Pain in the hands or feet This list may not describe all possible side effects. Call your doctor for medical advice about side effects. You may report side effects to FDA at 1-800-FDA-1088. Where should I keep my medication? Keep out of the reach of children. If you are using this medication at home, you will be instructed on how to store it. Throw away any unused medication after the expiration date on the label. NOTE: This sheet is a summary. It may not cover all possible information. If you have questions about this medicine, talk to your doctor, pharmacist, or health care provider.  2022 Elsevier/Gold Standard (2020-10-02 00:00:00)       To help prevent nausea and vomiting after your treatment, we encourage you to take your nausea medication as directed.  BELOW ARE SYMPTOMS THAT SHOULD BE REPORTED IMMEDIATELY: *FEVER GREATER THAN 100.4 F (38 C) OR HIGHER *CHILLS OR SWEATING *NAUSEA AND VOMITING THAT IS NOT CONTROLLED WITH YOUR NAUSEA MEDICATION *UNUSUAL SHORTNESS OF BREATH *UNUSUAL BRUISING OR BLEEDING *URINARY PROBLEMS (pain or burning when urinating, or frequent urination) *BOWEL PROBLEMS (unusual diarrhea, constipation, pain near the anus) TENDERNESS IN MOUTH AND THROAT WITH OR WITHOUT PRESENCE OF ULCERS (sore throat, sores in mouth, or a toothache) UNUSUAL RASH, SWELLING OR PAIN  UNUSUAL VAGINAL DISCHARGE OR ITCHING   Items with * indicate a potential emergency and should be followed up as soon as possible or go to the Emergency Department if any problems should occur.  Please show the CHEMOTHERAPY ALERT CARD or IMMUNOTHERAPY ALERT CARD at check-in to the Emergency Department and triage nurse.  Should you have questions after your visit or need to cancel or reschedule your appointment, please contact [facility]  Dept: [phone]  and follow the prompts.  Office hours are 8:00 a.m. to 4:30 p.m. Monday - Friday. Please note that voicemails left after 4:00 p.m. may not be returned until the following business day.  We are closed weekends and  major holidays. You have access to a nurse at all times for urgent questions. Please call the main number to the clinic Dept: [phone] and follow the prompts.   For any non-urgent questions, you may also contact your provider using MyChart. We now offer e-Visits for anyone 71 and older to request care online for non-urgent symptoms. For details visit [email or web address].   Also download the MyChart app! Go to the app store, search "MyChart", open the app, select [facility], and log in with your MyChart username and password.  Due to Covid, a  mask is required upon entering the hospital/clinic. If you do not have a mask, one will be given to you upon arrival. For doctor visits, patients may have 1 support person aged 12 or older with them. For treatment visits, patients cannot have anyone with them due to current Covid guidelines and our immunocompromised population.

## 2020-12-08 LAB — CANCER ANTIGEN 19-9: CA 19-9: 459 U/mL — ABNORMAL HIGH (ref 0–35)

## 2020-12-16 ENCOUNTER — Other Ambulatory Visit: Payer: Self-pay | Admitting: Oncology

## 2020-12-19 ENCOUNTER — Inpatient Hospital Stay: Payer: Medicare HMO

## 2020-12-19 ENCOUNTER — Telehealth: Payer: Self-pay

## 2020-12-19 ENCOUNTER — Other Ambulatory Visit: Payer: Self-pay

## 2020-12-19 ENCOUNTER — Inpatient Hospital Stay (HOSPITAL_BASED_OUTPATIENT_CLINIC_OR_DEPARTMENT_OTHER): Payer: Medicare HMO | Admitting: Oncology

## 2020-12-19 VITALS — BP 129/75 | HR 60 | Temp 98.5°F | Resp 18

## 2020-12-19 VITALS — BP 113/76 | HR 73 | Temp 98.9°F | Resp 18 | Ht 68.0 in | Wt 172.0 lb

## 2020-12-19 DIAGNOSIS — Z1379 Encounter for other screening for genetic and chromosomal anomalies: Secondary | ICD-10-CM

## 2020-12-19 DIAGNOSIS — C250 Malignant neoplasm of head of pancreas: Secondary | ICD-10-CM

## 2020-12-19 DIAGNOSIS — E119 Type 2 diabetes mellitus without complications: Secondary | ICD-10-CM | POA: Diagnosis not present

## 2020-12-19 DIAGNOSIS — Z5111 Encounter for antineoplastic chemotherapy: Secondary | ICD-10-CM | POA: Diagnosis not present

## 2020-12-19 DIAGNOSIS — D696 Thrombocytopenia, unspecified: Secondary | ICD-10-CM | POA: Diagnosis not present

## 2020-12-19 DIAGNOSIS — Z5189 Encounter for other specified aftercare: Secondary | ICD-10-CM | POA: Diagnosis not present

## 2020-12-19 DIAGNOSIS — Z79899 Other long term (current) drug therapy: Secondary | ICD-10-CM | POA: Diagnosis not present

## 2020-12-19 DIAGNOSIS — E785 Hyperlipidemia, unspecified: Secondary | ICD-10-CM | POA: Diagnosis not present

## 2020-12-19 LAB — CBC WITH DIFFERENTIAL (CANCER CENTER ONLY)
Abs Immature Granulocytes: 0.36 10*3/uL — ABNORMAL HIGH (ref 0.00–0.07)
Basophils Absolute: 0 10*3/uL (ref 0.0–0.1)
Basophils Relative: 0 %
Eosinophils Absolute: 0.1 10*3/uL (ref 0.0–0.5)
Eosinophils Relative: 0 %
HCT: 35.7 % — ABNORMAL LOW (ref 39.0–52.0)
Hemoglobin: 11.7 g/dL — ABNORMAL LOW (ref 13.0–17.0)
Immature Granulocytes: 3 %
Lymphocytes Relative: 22 %
Lymphs Abs: 2.4 10*3/uL (ref 0.7–4.0)
MCH: 29.7 pg (ref 26.0–34.0)
MCHC: 32.8 g/dL (ref 30.0–36.0)
MCV: 90.6 fL (ref 80.0–100.0)
Monocytes Absolute: 1.2 10*3/uL — ABNORMAL HIGH (ref 0.1–1.0)
Monocytes Relative: 11 %
Neutro Abs: 7 10*3/uL (ref 1.7–7.7)
Neutrophils Relative %: 64 %
Platelet Count: 83 10*3/uL — ABNORMAL LOW (ref 150–400)
RBC: 3.94 MIL/uL — ABNORMAL LOW (ref 4.22–5.81)
RDW: 16.4 % — ABNORMAL HIGH (ref 11.5–15.5)
WBC Count: 11.1 10*3/uL — ABNORMAL HIGH (ref 4.0–10.5)
nRBC: 0 % (ref 0.0–0.2)

## 2020-12-19 LAB — CMP (CANCER CENTER ONLY)
ALT: 19 U/L (ref 0–44)
AST: 17 U/L (ref 15–41)
Albumin: 4.3 g/dL (ref 3.5–5.0)
Alkaline Phosphatase: 151 U/L — ABNORMAL HIGH (ref 38–126)
Anion gap: 7 (ref 5–15)
BUN: 15 mg/dL (ref 8–23)
CO2: 27 mmol/L (ref 22–32)
Calcium: 9.4 mg/dL (ref 8.9–10.3)
Chloride: 102 mmol/L (ref 98–111)
Creatinine: 0.63 mg/dL (ref 0.61–1.24)
GFR, Estimated: >60 mL/min (ref >60)
Glucose, Bld: 287 mg/dL — ABNORMAL HIGH (ref 70–99)
Potassium: 4.1 mmol/L (ref 3.5–5.1)
Sodium: 136 mmol/L (ref 135–145)
Total Bilirubin: 0.6 mg/dL (ref 0.3–1.2)
Total Protein: 6.1 g/dL — ABNORMAL LOW (ref 6.5–8.1)

## 2020-12-19 MED ORDER — SODIUM CHLORIDE 0.9 % IV SOLN
800.0000 mg/m2 | Freq: Once | INTRAVENOUS | Status: AC
  Administered 2020-12-19: 1520 mg via INTRAVENOUS
  Filled 2020-12-19: qty 26.3

## 2020-12-19 MED ORDER — SODIUM CHLORIDE 0.9 % IV SOLN
Freq: Once | INTRAVENOUS | Status: AC
Start: 2020-12-19 — End: 2020-12-19

## 2020-12-19 MED ORDER — PROCHLORPERAZINE MALEATE 10 MG PO TABS
10.0000 mg | ORAL_TABLET | Freq: Once | ORAL | Status: AC
  Administered 2020-12-19: 10 mg via ORAL
  Filled 2020-12-19: qty 1

## 2020-12-19 MED ORDER — SODIUM CHLORIDE 0.9% FLUSH
10.0000 mL | INTRAVENOUS | Status: DC | PRN
  Administered 2020-12-19: 10 mL

## 2020-12-19 MED ORDER — LORAZEPAM 1 MG PO TABS: 0.5000 mg | ORAL_TABLET | Freq: Every evening | ORAL | 1 refills | Status: DC | PRN

## 2020-12-19 MED ORDER — PACLITAXEL PROTEIN-BOUND CHEMO INJECTION 100 MG
100.0000 mg/m2 | Freq: Once | INTRAVENOUS | Status: AC
  Administered 2020-12-19: 200 mg via INTRAVENOUS
  Filled 2020-12-19: qty 40

## 2020-12-19 MED ORDER — HEPARIN SOD (PORK) LOCK FLUSH 100 UNIT/ML IV SOLN
500.0000 [IU] | Freq: Once | INTRAVENOUS | Status: AC | PRN
  Administered 2020-12-19: 500 [IU]

## 2020-12-19 MED ORDER — HEPARIN SOD (PORK) LOCK FLUSH 100 UNIT/ML IV SOLN: 500.0000 [IU] | Freq: Once | INTRAVENOUS | Status: DC | PRN

## 2020-12-19 MED ORDER — SODIUM CHLORIDE 0.9% FLUSH: 10.0000 mL | INTRAVENOUS | Status: DC | PRN

## 2020-12-19 NOTE — Patient Instructions (Signed)
[facility]   Discharge Instructions: Thank you for choosing [facility] to provide your oncology and hematology care.   If you have a lab appointment with the [facility], please go directly to the [facility] and check in at the registration area.   Wear comfortable clothing and clothing appropriate for easy access to any Portacath or PICC line.   We strive to give you quality time with your provider. You may need to reschedule your appointment if you arrive late (15 or more minutes).  Arriving late affects you and other patients whose appointments are after yours.  Also, if you miss three or more appointments without notifying the office, you may be dismissed from the clinic at the provider's discretion.      For prescription refill requests, have your pharmacy contact our office and allow 72 hours for refills to be completed.    Today you received the following chemotherapy and/or immunotherapy agents Paclitaxel-protein bound (ABRAXANE) & Gemcitabine (GEMZAR).      To help prevent nausea and vomiting after your treatment, we encourage you to take your nausea medication as directed.  BELOW ARE SYMPTOMS THAT SHOULD BE REPORTED IMMEDIATELY: *FEVER GREATER THAN 100.4 F (38 C) OR HIGHER *CHILLS OR SWEATING *NAUSEA AND VOMITING THAT IS NOT CONTROLLED WITH YOUR NAUSEA MEDICATION *UNUSUAL SHORTNESS OF BREATH *UNUSUAL BRUISING OR BLEEDING *URINARY PROBLEMS (pain or burning when urinating, or frequent urination) *BOWEL PROBLEMS (unusual diarrhea, constipation, pain near the anus) TENDERNESS IN MOUTH AND THROAT WITH OR WITHOUT PRESENCE OF ULCERS (sore throat, sores in mouth, or a toothache) UNUSUAL RASH, SWELLING OR PAIN  UNUSUAL VAGINAL DISCHARGE OR ITCHING   Items with * indicate a potential emergency and should be followed up as soon as possible or go to the Emergency Department if any problems should occur.  Please show the CHEMOTHERAPY ALERT  CARD or IMMUNOTHERAPY ALERT CARD at check-in to the Emergency Department and triage nurse.  Should you have questions after your visit or need to cancel or reschedule your appointment, please contact [facility]  Dept: [phone]  and follow the prompts.  Office hours are 8:00 a.m. to 4:30 p.m. Monday - Friday. Please note that voicemails left after 4:00 p.m. may not be returned until the following business day.  We are closed weekends and major holidays. You have access to a nurse at all times for urgent questions. Please call the main number to the clinic Dept: [phone] and follow the prompts.   For any non-urgent questions, you may also contact your provider using MyChart. We now offer e-Visits for anyone 36 and older to request care online for non-urgent symptoms. For details visit [email or web address].   Also download the MyChart app! Go to the app store, search "MyChart", open the app, select [facility], and log in with your MyChart username and password.  Due to Covid, a mask is required upon entering the hospital/clinic. If you do not have a mask, one will be given to you upon arrival. For doctor visits, patients may have 1 support person aged 85 or older with them. For treatment visits, patients cannot have anyone with them due to current Covid guidelines and our immunocompromised population.   Nanoparticle Albumin-Bound Paclitaxel injection What is this medication? NANOPARTICLE ALBUMIN-BOUND PACLITAXEL (Na no PAHR ti kuhl  al BYOO muhn-bound  PAK li TAX el) is a chemotherapy drug. It targets fast dividing cells, like cancer cells, and causes these cells to die.  This medicine is used to treat advanced breast cancer, lung cancer, and pancreatic cancer. This medicine may be used for other purposes; ask your health care provider or pharmacist if you have questions. COMMON BRAND NAME(S): Abraxane What should I tell my care team before I take this  medication? They need to know if you have any of these conditions: kidney disease liver disease low blood counts, like low white cell, platelet, or red cell counts lung or breathing disease, like asthma tingling of the fingers or toes, or other nerve disorder an unusual or allergic reaction to paclitaxel, albumin, other chemotherapy, other medicines, foods, dyes, or preservatives pregnant or trying to get pregnant breast-feeding How should I use this medication? This drug is given as an infusion into a vein. It is administered in a hospital or clinic by a specially trained health care professional. Talk to your pediatrician regarding the use of this medicine in children. Special care may be needed. Overdosage: If you think you have taken too much of this medicine contact a poison control center or emergency room at once. NOTE: This medicine is only for you. Do not share this medicine with others. What if I miss a dose? It is important not to miss your dose. Call your doctor or health care professional if you are unable to keep an appointment. What may interact with this medication? This medicine may interact with the following medications: antiviral medicines for hepatitis, HIV or AIDS certain antibiotics like erythromycin and clarithromycin certain medicines for fungal infections like ketoconazole and itraconazole certain medicines for seizures like carbamazepine, phenobarbital, phenytoin gemfibrozil nefazodone rifampin St. John's wort This list may not describe all possible interactions. Give your health care provider a list of all the medicines, herbs, non-prescription drugs, or dietary supplements you use. Also tell them if you smoke, drink alcohol, or use illegal drugs. Some items may interact with your medicine. What should I watch for while using this medication? Your condition will be monitored carefully while you are receiving this medicine. You will need important blood work  done while you are taking this medicine. This medicine can cause serious allergic reactions. If you experience allergic reactions like skin rash, itching or hives, swelling of the face, lips, or tongue, tell your doctor or health care professional right away. In some cases, you may be given additional medicines to help with side effects. Follow all directions for their use. This drug may make you feel generally unwell. This is not uncommon, as chemotherapy can affect healthy cells as well as cancer cells. Report any side effects. Continue your course of treatment even though you feel ill unless your doctor tells you to stop. Call your doctor or health care professional for advice if you get a fever, chills or sore throat, or other symptoms of a cold or flu. Do not treat yourself. This drug decreases your body's ability to fight infections. Try to avoid being around people who are sick. This medicine may increase your risk to bruise or bleed. Call your doctor or health care professional if you notice any unusual bleeding. Be careful brushing and flossing your teeth or using a toothpick because you may get an infection or bleed more easily. If you have any dental work done, tell your dentist you are receiving this medicine. Avoid taking products that contain aspirin, acetaminophen, ibuprofen, naproxen, or ketoprofen unless instructed by your doctor. These medicines may hide a fever. Do not become pregnant while taking this medicine or for 6 months after  stopping it. Women should inform their doctor if they wish to become pregnant or think they might be pregnant. Men should not father a child while taking this medicine or for 3 months after stopping it. There is a potential for serious side effects to an unborn child. Talk to your health care professional or pharmacist for more information. Do not breast-feed an infant while taking this medicine or for 2 weeks after stopping it. This medicine may interfere  with the ability to get pregnant or to father a child. You should talk to your doctor or health care professional if you are concerned about your fertility. What side effects may I notice from receiving this medication? Side effects that you should report to your doctor or health care professional as soon as possible: allergic reactions like skin rash, itching or hives, swelling of the face, lips, or tongue breathing problems changes in vision fast, irregular heartbeat low blood pressure mouth sores pain, tingling, numbness in the hands or feet signs of decreased platelets or bleeding - bruising, pinpoint red spots on the skin, black, tarry stools, blood in the urine signs of decreased red blood cells - unusually weak or tired, feeling faint or lightheaded, falls signs of infection - fever or chills, cough, sore throat, pain or difficulty passing urine signs and symptoms of liver injury like dark yellow or brown urine; general ill feeling or flu-like symptoms; light-colored stools; loss of appetite; nausea; right upper belly pain; unusually weak or tired; yellowing of the eyes or skin swelling of the ankles, feet, hands unusually slow heartbeat Side effects that usually do not require medical attention (report to your doctor or health care professional if they continue or are bothersome): diarrhea hair loss loss of appetite nausea, vomiting tiredness This list may not describe all possible side effects. Call your doctor for medical advice about side effects. You may report side effects to FDA at 1-800-FDA-1088. Where should I keep my medication? This drug is given in a hospital or clinic and will not be stored at home. NOTE: This sheet is a summary. It may not cover all possible information. If you have questions about this medicine, talk to your doctor, pharmacist, or health care provider.  2022 Elsevier/Gold Standard (2016-09-16 00:00:00)  Gemcitabine injection What is this  medication? GEMCITABINE (jem SYE ta been) is a chemotherapy drug. This medicine is used to treat many types of cancer like breast cancer, lung cancer, pancreatic cancer, and ovarian cancer. This medicine may be used for other purposes; ask your health care provider or pharmacist if you have questions. COMMON BRAND NAME(S): Gemzar, Infugem What should I tell my care team before I take this medication? They need to know if you have any of these conditions: blood disorders infection kidney disease liver disease lung or breathing disease, like asthma recent or ongoing radiation therapy an unusual or allergic reaction to gemcitabine, other chemotherapy, other medicines, foods, dyes, or preservatives pregnant or trying to get pregnant breast-feeding How should I use this medication? This drug is given as an infusion into a vein. It is administered in a hospital or clinic by a specially trained health care professional. Talk to your pediatrician regarding the use of this medicine in children. Special care may be needed. Overdosage: If you think you have taken too much of this medicine contact a poison control center or emergency room at once. NOTE: This medicine is only for you. Do not share this medicine with others. What if I miss a dose?  It is important not to miss your dose. Call your doctor or health care professional if you are unable to keep an appointment. What may interact with this medication? medicines to increase blood counts like filgrastim, pegfilgrastim, sargramostim some other chemotherapy drugs like cisplatin vaccines Talk to your doctor or health care professional before taking any of these medicines: acetaminophen aspirin ibuprofen ketoprofen naproxen This list may not describe all possible interactions. Give your health care provider a list of all the medicines, herbs, non-prescription drugs, or dietary supplements you use. Also tell them if you smoke, drink alcohol, or  use illegal drugs. Some items may interact with your medicine. What should I watch for while using this medication? Visit your doctor for checks on your progress. This drug may make you feel generally unwell. This is not uncommon, as chemotherapy can affect healthy cells as well as cancer cells. Report any side effects. Continue your course of treatment even though you feel ill unless your doctor tells you to stop. In some cases, you may be given additional medicines to help with side effects. Follow all directions for their use. Call your doctor or health care professional for advice if you get a fever, chills or sore throat, or other symptoms of a cold or flu. Do not treat yourself. This drug decreases your body's ability to fight infections. Try to avoid being around people who are sick. This medicine may increase your risk to bruise or bleed. Call your doctor or health care professional if you notice any unusual bleeding. Be careful brushing and flossing your teeth or using a toothpick because you may get an infection or bleed more easily. If you have any dental work done, tell your dentist you are receiving this medicine. Avoid taking products that contain aspirin, acetaminophen, ibuprofen, naproxen, or ketoprofen unless instructed by your doctor. These medicines may hide a fever. Do not become pregnant while taking this medicine or for 6 months after stopping it. Women should inform their doctor if they wish to become pregnant or think they might be pregnant. Men should not father a child while taking this medicine and for 3 months after stopping it. There is a potential for serious side effects to an unborn child. Talk to your health care professional or pharmacist for more information. Do not breast-feed an infant while taking this medicine or for at least 1 week after stopping it. Men should inform their doctors if they wish to father a child. This medicine may lower sperm counts. Talk with your  doctor or health care professional if you are concerned about your fertility. What side effects may I notice from receiving this medication? Side effects that you should report to your doctor or health care professional as soon as possible: allergic reactions like skin rash, itching or hives, swelling of the face, lips, or tongue breathing problems pain, redness, or irritation at site where injected signs and symptoms of a dangerous change in heartbeat or heart rhythm like chest pain; dizziness; fast or irregular heartbeat; palpitations; feeling faint or lightheaded, falls; breathing problems signs of decreased platelets or bleeding - bruising, pinpoint red spots on the skin, black, tarry stools, blood in the urine signs of decreased red blood cells - unusually weak or tired, feeling faint or lightheaded, falls signs of infection - fever or chills, cough, sore throat, pain or difficulty passing urine signs and symptoms of kidney injury like trouble passing urine or change in the amount of urine signs and symptoms of liver injury  like dark yellow or brown urine; general ill feeling or flu-like symptoms; light-colored stools; loss of appetite; nausea; right upper belly pain; unusually weak or tired; yellowing of the eyes or skin swelling of ankles, feet, hands Side effects that usually do not require medical attention (report to your doctor or health care professional if they continue or are bothersome): constipation diarrhea hair loss loss of appetite nausea rash vomiting This list may not describe all possible side effects. Call your doctor for medical advice about side effects. You may report side effects to FDA at 1-800-FDA-1088. Where should I keep my medication? This drug is given in a hospital or clinic and will not be stored at home. NOTE: This sheet is a summary. It may not cover all possible information. If you have questions about this medicine, talk to your doctor, pharmacist, or  health care provider.  2022 Elsevier/Gold Standard (2017-04-08 00:00:00)

## 2020-12-19 NOTE — Progress Notes (Signed)
Patient presents for treatment. RN assessment completed along with the following:  Labs/vitals reviewed - Yes, and Plt. 83, okay to treat per collab nurse note.    Weight within 10% of previous measurement - Yes Oncology Treatment Attestation completed for current therapy- Yes, on date 10/25/2020 Informed consent completed and reflects current therapy/intent - Yes, on date 11/22/2020             Provider progress note reviewed - Today's provider note is not yet available. I reviewed the most recent oncology provider progress note in chart dated 12/06/2020. Treatment/Antibody/Supportive plan reviewed - Yes, and there are no adjustments needed for today's treatment. S&H and other orders reviewed - Yes, and there are no additional orders identified. Previous treatment date reviewed - Yes, and the appropriate amount of time has elapsed between treatments. Clinic Hand Off Received from - Collab nurse note  Patient to proceed with treatment.

## 2020-12-19 NOTE — Telephone Encounter (Signed)
Patient seen by Dr. Benay Spice today  Vitals are within treatment parameters.  Labs reviewed by Dr. Benay Spice and are not all within treatment parameters. OK to treat with Platelet  83 per Dr Benay Spice   Per physician team, patient is ready for treatment and there are NO modifications to the treatment plan.

## 2020-12-19 NOTE — Progress Notes (Signed)
Labs entered for visit 12/19/20

## 2020-12-19 NOTE — Progress Notes (Signed)
[facility] OFFICE PROGRESS NOTE   Diagnosis: Pancreas cancer  INTERVAL HISTORY:   [facility] Mora completed another treatment with gemcitabine/Abraxane.  He tolerated the chemotherapy well.  Stable numbness in the left thumb.  No other numbness.  Good appetite.  He is playing golf.  Objective:  Vital signs in last 24 hours:  Blood pressure 113/76, pulse 73, temperature 98.9 F (37.2 C), temperature source Oral, resp. rate 18, height '5\' 8"'  (1.727 m), weight 172 lb (78 kg), SpO2 100 %.    HEENT: No thrush or ulcers Resp: Lungs clear bilaterally Cardio: Regular rate and rhythm GI: No mass, no hepatosplenomegaly, no apparent ascites Vascular: No leg edema    Portacath/PICC-without erythema  Lab Results:  Lab Results  Component Value Date   WBC 11.1 (H) 12/19/2020   HGB 11.7 (L) 12/19/2020   HCT 35.7 (L) 12/19/2020   MCV 90.6 12/19/2020   PLT 83 (L) 12/19/2020   NEUTROABS 7.0 12/19/2020    CMP  Lab Results  Component Value Date   NA 136 12/19/2020   K 4.1 12/19/2020   CL 102 12/19/2020   CO2 27 12/19/2020   GLUCOSE 287 (H) 12/19/2020   BUN 15 12/19/2020   CREATININE 0.63 12/19/2020   CALCIUM 9.4 12/19/2020   PROT 6.1 (L) 12/19/2020   ALBUMIN 4.3 12/19/2020   AST 17 12/19/2020   ALT 19 12/19/2020   ALKPHOS 151 (H) 12/19/2020   BILITOT 0.6 12/19/2020   GFRNONAA >60 12/19/2020   GFRAA 99 12/21/2019    Lab Results  Component Value Date   [id number] 654 (H) 12/06/2020   Medications: I have reviewed the patient's current medications.   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  Fat planes around the celiac axis preserved.  Less than 90 degree contact of the lateral wall of the SMA, no evidence of metastatic disease, 3 mm left lower lobe pulmonary nodule Cycle 1 FOLFOX 05/09/2020 (plan to add irinotecan with cycle 2 pending tolerance) Cycle 2 FOLFIRINOX 05/23/2020 Cycle 3 FOLFIRINOX 06/06/2020, oxaliplatin dose reduced secondary to thrombocytopenia Cycle 4 FOLFIRINOX 06/27/2020, oxaliplatin further dose reduced secondary to thrombocytopenia CTs at [facility] 07/03/2020-redemonstration of a heterogeneous pancreatic head mass with borderline encasement of the SMA as well as abutment of the portal vein, SMV and likely the IVC.  Similar mildly enlarged porta hepatis and pericaval lymph nodes.  No convincing evidence of distant metastatic disease. 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  More than 180 degree involvement of the SMV and SMA Cycle 1 gemcitabine/Abraxane 11/22/2020, [facility] counselor contacted him  11/26/2020-no pathogenic variants identified in the 84 genes analyzed.  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 Cycle 2 gemcitabine/Abraxane 12/06/2020, Udenyca Cycle 3  gemcitabine/Abraxane 12/19/2020, Udenyca Obstructive jaundice secondary #1 ERCP 04/11/2020-prominent and floppy major papilla, dilated bile duct secondary to a distal stricture, placement of an uncovered metal stent, bile duct brushings-reactive glandular cells 3.   Diabetes   4.   Hyperlipidemia   5.   Glaucoma   6.   Port-A-Cath placement 05/04/2020 Interventional Radiology   7.   Mild thrombocytopenia (121,000) 05/09/2020      Disposition: [facility] Mora appears stable.  He is tolerating the gemcitabine/Abraxane well.  He will complete another cycle today.  He will continue G-CSF support.  The platelet count is moderately decreased and has not changed significantly over the past month.  He will return for an office visit and chemotherapy in 2 weeks.  The CA 19-9 was lower on 12/06/2020.  We will follow-up on the CA 19-9 from today.  [name], MD  12/19/2020  9:16 AM

## 2020-12-20 ENCOUNTER — Other Ambulatory Visit: Payer: Self-pay | Admitting: Family Medicine

## 2020-12-20 DIAGNOSIS — E785 Hyperlipidemia, unspecified: Secondary | ICD-10-CM

## 2020-12-21 ENCOUNTER — Other Ambulatory Visit: Payer: Self-pay

## 2020-12-21 ENCOUNTER — Inpatient Hospital Stay: Payer: Medicare HMO

## 2020-12-21 VITALS — BP 120/71 | HR 81 | Temp 97.7°F | Resp 18

## 2020-12-21 DIAGNOSIS — E785 Hyperlipidemia, unspecified: Secondary | ICD-10-CM | POA: Diagnosis not present

## 2020-12-21 DIAGNOSIS — C250 Malignant neoplasm of head of pancreas: Secondary | ICD-10-CM | POA: Diagnosis not present

## 2020-12-21 DIAGNOSIS — Z5189 Encounter for other specified aftercare: Secondary | ICD-10-CM | POA: Diagnosis not present

## 2020-12-21 DIAGNOSIS — E119 Type 2 diabetes mellitus without complications: Secondary | ICD-10-CM | POA: Diagnosis not present

## 2020-12-21 DIAGNOSIS — Z5111 Encounter for antineoplastic chemotherapy: Secondary | ICD-10-CM | POA: Diagnosis not present

## 2020-12-21 DIAGNOSIS — Z79899 Other long term (current) drug therapy: Secondary | ICD-10-CM | POA: Diagnosis not present

## 2020-12-21 DIAGNOSIS — D696 Thrombocytopenia, unspecified: Secondary | ICD-10-CM | POA: Diagnosis not present

## 2020-12-21 MED ORDER — PEGFILGRASTIM-CBQV 6 MG/0.6ML ~~LOC~~ SOSY
6.0000 mg | PREFILLED_SYRINGE | Freq: Once | SUBCUTANEOUS | Status: AC
  Administered 2020-12-21: 6 mg via SUBCUTANEOUS

## 2020-12-21 NOTE — Patient Instructions (Signed)

## 2020-12-29 ENCOUNTER — Other Ambulatory Visit: Payer: Self-pay | Admitting: Oncology

## 2021-01-03 ENCOUNTER — Other Ambulatory Visit: Payer: Self-pay

## 2021-01-03 ENCOUNTER — Inpatient Hospital Stay: Payer: Medicare HMO

## 2021-01-03 ENCOUNTER — Inpatient Hospital Stay: Payer: Medicare HMO | Attending: Oncology

## 2021-01-03 ENCOUNTER — Inpatient Hospital Stay (HOSPITAL_BASED_OUTPATIENT_CLINIC_OR_DEPARTMENT_OTHER): Payer: Medicare HMO | Admitting: Oncology

## 2021-01-03 VITALS — BP 134/76 | HR 60 | Temp 97.8°F | Resp 20

## 2021-01-03 VITALS — BP 119/66 | HR 73 | Temp 97.8°F | Resp 20 | Ht 68.0 in | Wt 172.0 lb

## 2021-01-03 DIAGNOSIS — Z5189 Encounter for other specified aftercare: Secondary | ICD-10-CM | POA: Diagnosis not present

## 2021-01-03 DIAGNOSIS — M25511 Pain in right shoulder: Secondary | ICD-10-CM | POA: Insufficient documentation

## 2021-01-03 DIAGNOSIS — C250 Malignant neoplasm of head of pancreas: Secondary | ICD-10-CM | POA: Insufficient documentation

## 2021-01-03 DIAGNOSIS — Z5111 Encounter for antineoplastic chemotherapy: Secondary | ICD-10-CM | POA: Insufficient documentation

## 2021-01-03 DIAGNOSIS — C259 Malignant neoplasm of pancreas, unspecified: Secondary | ICD-10-CM

## 2021-01-03 DIAGNOSIS — Z791 Long term (current) use of non-steroidal anti-inflammatories (NSAID): Secondary | ICD-10-CM | POA: Diagnosis not present

## 2021-01-03 DIAGNOSIS — D696 Thrombocytopenia, unspecified: Secondary | ICD-10-CM | POA: Diagnosis not present

## 2021-01-03 LAB — CBC WITH DIFFERENTIAL (CANCER CENTER ONLY)
Abs Immature Granulocytes: 0.17 10*3/uL — ABNORMAL HIGH (ref 0.00–0.07)
Basophils Absolute: 0 10*3/uL (ref 0.0–0.1)
Basophils Relative: 0 %
Eosinophils Absolute: 0 10*3/uL (ref 0.0–0.5)
Eosinophils Relative: 0 %
HCT: 35.8 % — ABNORMAL LOW (ref 39.0–52.0)
Hemoglobin: 11.4 g/dL — ABNORMAL LOW (ref 13.0–17.0)
Immature Granulocytes: 1 %
Lymphocytes Relative: 25 %
Lymphs Abs: 3.1 10*3/uL (ref 0.7–4.0)
MCH: 29.3 pg (ref 26.0–34.0)
MCHC: 31.8 g/dL (ref 30.0–36.0)
MCV: 92 fL (ref 80.0–100.0)
Monocytes Absolute: 1.4 10*3/uL — ABNORMAL HIGH (ref 0.1–1.0)
Monocytes Relative: 11 %
Neutro Abs: 7.7 10*3/uL (ref 1.7–7.7)
Neutrophils Relative %: 63 %
Platelet Count: 88 10*3/uL — ABNORMAL LOW (ref 150–400)
RBC: 3.89 MIL/uL — ABNORMAL LOW (ref 4.22–5.81)
RDW: 17.6 % — ABNORMAL HIGH (ref 11.5–15.5)
WBC Count: 12.4 10*3/uL — ABNORMAL HIGH (ref 4.0–10.5)
nRBC: 0 % (ref 0.0–0.2)

## 2021-01-03 LAB — CMP (CANCER CENTER ONLY)
ALT: 18 U/L (ref 0–44)
AST: 16 U/L (ref 15–41)
Albumin: 4.1 g/dL (ref 3.5–5.0)
Alkaline Phosphatase: 134 U/L — ABNORMAL HIGH (ref 38–126)
Anion gap: 8 (ref 5–15)
BUN: 17 mg/dL (ref 8–23)
CO2: 26 mmol/L (ref 22–32)
Calcium: 8.8 mg/dL — ABNORMAL LOW (ref 8.9–10.3)
Chloride: 104 mmol/L (ref 98–111)
Creatinine: 0.67 mg/dL (ref 0.61–1.24)
GFR, Estimated: >60 mL/min (ref >60)
Glucose, Bld: 250 mg/dL — ABNORMAL HIGH (ref 70–99)
Potassium: 4 mmol/L (ref 3.5–5.1)
Sodium: 138 mmol/L (ref 135–145)
Total Bilirubin: 0.6 mg/dL (ref 0.3–1.2)
Total Protein: 6 g/dL — ABNORMAL LOW (ref 6.5–8.1)

## 2021-01-03 MED ORDER — SODIUM CHLORIDE 0.9 % IV SOLN
800.0000 mg/m2 | Freq: Once | INTRAVENOUS | Status: AC
  Administered 2021-01-03: 1520 mg via INTRAVENOUS
  Filled 2021-01-03: qty 15.78

## 2021-01-03 MED ORDER — SODIUM CHLORIDE 0.9 % IV SOLN: Freq: Once | INTRAVENOUS | Status: AC

## 2021-01-03 MED ORDER — HEPARIN SOD (PORK) LOCK FLUSH 100 UNIT/ML IV SOLN
500.0000 [IU] | Freq: Once | INTRAVENOUS | Status: AC | PRN
  Administered 2021-01-03: 500 [IU]

## 2021-01-03 MED ORDER — PANCRELIPASE (LIP-PROT-AMYL) 36000-114000 UNITS PO CPEP: ORAL_CAPSULE | ORAL | 0 refills | Status: DC

## 2021-01-03 MED ORDER — PROCHLORPERAZINE MALEATE 10 MG PO TABS
10.0000 mg | ORAL_TABLET | Freq: Once | ORAL | Status: AC
  Administered 2021-01-03: 10 mg via ORAL
  Filled 2021-01-03: qty 1

## 2021-01-03 MED ORDER — PACLITAXEL PROTEIN-BOUND CHEMO INJECTION 100 MG
100.0000 mg/m2 | Freq: Once | INTRAVENOUS | Status: AC
  Administered 2021-01-03: 200 mg via INTRAVENOUS
  Filled 2021-01-03: qty 40

## 2021-01-03 MED ORDER — SODIUM CHLORIDE 0.9% FLUSH
10.0000 mL | INTRAVENOUS | Status: DC | PRN
  Administered 2021-01-03: 10 mL

## 2021-01-03 NOTE — Progress Notes (Signed)
[place] OFFICE PROGRESS NOTE   Diagnosis: Pancreas cancer  INTERVAL HISTORY:   [facility] Mora completed on cycle of gemcitabine/Abraxane on 12/19/2020.  No fever or rash.  No change in numbness in 1 finger.  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  This is not relieved with Tylenol or 400 mg of ibuprofen.  The pain is worse with abduction.  Objective:  Vital signs in last 24 hours:  Blood pressure 119/66, pulse 73, temperature 97.8 F (36.6 C), temperature source Oral, resp. rate 20, height '5\' 8"'  (1.727 m), weight 172 lb (78 kg), SpO2 100 %.    HEENT: No thrush or ulcers Resp: Lungs clear bilaterally Cardio: Regular rate and rhythm GI: No mass, no hepatosplenomegaly, nontender Vascular: No leg edema Musculoskeletal: No pain with motion of the right shoulder  Portacath/PICC-without erythema  Lab Results:  Lab Results  Component Value Date   WBC 12.4 (H) 01/03/2021   HGB 11.4 (L) 01/03/2021   HCT 35.8 (L) 01/03/2021   MCV 92.0 01/03/2021   PLT 88 (L) 01/03/2021   NEUTROABS 7.7 01/03/2021    CMP  Lab Results  Component Value Date   NA 136 12/19/2020   K 4.1 12/19/2020   CL 102 12/19/2020   CO2 27 12/19/2020   GLUCOSE 287 (H) 12/19/2020   BUN 15 12/19/2020   CREATININE 0.63 12/19/2020   CALCIUM 9.4 12/19/2020   PROT 6.1 (L) 12/19/2020   ALBUMIN 4.3 12/19/2020   AST 17 12/19/2020   ALT 19 12/19/2020   ALKPHOS 151 (H) 12/19/2020   BILITOT 0.6 12/19/2020   GFRNONAA >60 12/19/2020   GFRAA 99 12/21/2019    Lab Results  Component Value Date   [id number] 459 (H) 12/06/2020   Medications: I have reviewed the patient's current medications.   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  Fat planes around the celiac axis preserved.  Less than 90 degree contact of the lateral wall of the SMA, no evidence of metastatic disease, 3 mm left lower lobe pulmonary nodule Cycle 1 FOLFOX 05/09/2020 (plan to add irinotecan with cycle 2 pending tolerance) Cycle 2 FOLFIRINOX 05/23/2020 Cycle 3 FOLFIRINOX 06/06/2020, oxaliplatin dose reduced secondary to thrombocytopenia Cycle 4 FOLFIRINOX 06/27/2020, oxaliplatin further dose reduced secondary to thrombocytopenia CTs at [facility] 07/03/2020-redemonstration of a heterogeneous pancreatic head mass with borderline encasement of the SMA as well as abutment of the portal vein, SMV and likely the IVC.  Similar mildly enlarged porta hepatis and pericaval lymph nodes.  No convincing evidence of distant metastatic disease. 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  More than 180 degree involvement of the SMV and SMA Cycle 1 gemcitabine/Abraxane 11/22/2020, [place] counselor contacted him 11/26/2020-no pathogenic variants identified in the 84 genes analyzed.  A variant of  uncertain significance identified in the BAP1 gene. Cycle 2 gemcitabine/Abraxane 12/06/2020, Udenyca Cycle 3 gemcitabine/Abraxane 12/19/2020, Udenyca Cycle 4 gemcitabine/Abraxane 01/03/2021, Udenyca Obstructive jaundice secondary #1 ERCP 04/11/2020-prominent and floppy major papilla, dilated bile duct secondary to a distal stricture, placement of an uncovered metal stent, bile duct brushings-reactive glandular cells 3.   Diabetes   4.   Hyperlipidemia   5.   Glaucoma   6.   Port-A-Cath placement 05/04/2020 Interventional Radiology   7.   Mild thrombocytopenia (121,000) 05/09/2020   Disposition: [facility] Mora appears stable.  Tolerating the gemcitabine/Abraxane well.  He will complete another cycle today.  He has stable thrombocytopenia.  He will return for an office visit and chemotherapy in 2 weeks.  He will try 600 mg of ibuprofen and Tylenol for the right shoulder discomfort.  He will contact us if this does not help.  [name], MD  01/03/2021  9:58 AM

## 2021-01-03 NOTE — Patient Instructions (Signed)
Nathan Mora   Discharge Instructions: Thank you for choosing [facility] to provide your oncology and hematology care.   If you have a lab appointment with the [facility], please go directly to the [facility] and check in at the registration area.   Wear comfortable clothing and clothing appropriate for easy access to any Portacath or PICC line.   We strive to give you quality time with your provider. You may need to reschedule your appointment if you arrive late (15 or more minutes).  Arriving late affects you and other patients whose appointments are after yours.  Also, if you miss three or more appointments without notifying the office, you may be dismissed from the clinic at the provider's discretion.      For prescription refill requests, have your pharmacy contact our office and allow 72 hours for refills to be completed.    Today you received the following chemotherapy and/or immunotherapy agents Paclitaxel-Protein bound (ABRAXANE) & Gemcitabine (GEMZAR).      To help prevent nausea and vomiting after your treatment, we encourage you to take your nausea medication as directed.  BELOW ARE SYMPTOMS THAT SHOULD BE REPORTED IMMEDIATELY: *FEVER GREATER THAN 100.4 F (38 C) OR HIGHER *CHILLS OR SWEATING *NAUSEA AND VOMITING THAT IS NOT CONTROLLED WITH YOUR NAUSEA MEDICATION *UNUSUAL SHORTNESS OF BREATH *UNUSUAL BRUISING OR BLEEDING *URINARY PROBLEMS (pain or burning when urinating, or frequent urination) *BOWEL PROBLEMS (unusual diarrhea, constipation, pain near the anus) TENDERNESS IN MOUTH AND THROAT WITH OR WITHOUT PRESENCE OF ULCERS (sore throat, sores in mouth, or a toothache) UNUSUAL RASH, SWELLING OR PAIN  UNUSUAL VAGINAL DISCHARGE OR ITCHING   Items with * indicate a potential emergency and should be followed up as soon as possible or go to the Emergency Department if any problems should occur.  Please show the CHEMOTHERAPY ALERT  CARD or IMMUNOTHERAPY ALERT CARD at check-in to the Emergency Department and triage nurse.  Should you have questions after your visit or need to cancel or reschedule your appointment, please contact [facility]  Dept: [phone]  and follow the prompts.  Office hours are 8:00 a.m. to 4:30 p.m. Monday - Friday. Please note that voicemails left after 4:00 p.m. may not be returned until the following business day.  We are closed weekends and major holidays. You have access to a nurse at all times for urgent questions. Please call the main number to the clinic Dept: [phone] and follow the prompts.   For any non-urgent questions, you may also contact your provider using MyChart. We now offer e-Visits for anyone 55 and older to request care online for non-urgent symptoms. For details visit [email or web address].   Also download the MyChart app! Go to the app store, search "MyChart", open the app, select [facility], and log in with your MyChart username and password.  Due to Covid, a mask is required upon entering the hospital/clinic. If you do not have a mask, one will be given to you upon arrival. For doctor visits, patients may have 1 support person aged 9 or older with them. For treatment visits, patients cannot have anyone with them due to current Covid guidelines and our immunocompromised population.   Nanoparticle Albumin-Bound Paclitaxel injection What is this medication? NANOPARTICLE ALBUMIN-BOUND PACLITAXEL (Na no PAHR ti kuhl  al BYOO muhn-bound  PAK li TAX el) is a chemotherapy drug. It targets fast dividing cells, like cancer cells, and causes these cells to die.  This medicine is used to treat advanced breast cancer, lung cancer, and pancreatic cancer. This medicine may be used for other purposes; ask your health care provider or pharmacist if you have questions. COMMON BRAND NAME(S): Abraxane What should I tell my care team before I take this  medication? They need to know if you have any of these conditions: kidney disease liver disease low blood counts, like low white cell, platelet, or red cell counts lung or breathing disease, like asthma tingling of the fingers or toes, or other nerve disorder an unusual or allergic reaction to paclitaxel, albumin, other chemotherapy, other medicines, foods, dyes, or preservatives pregnant or trying to get pregnant breast-feeding How should I use this medication? This drug is given as an infusion into a vein. It is administered in a hospital or clinic by a specially trained health care professional. Talk to your pediatrician regarding the use of this medicine in children. Special care may be needed. Overdosage: If you think you have taken too much of this medicine contact a poison control center or emergency room at once. NOTE: This medicine is only for you. Do not share this medicine with others. What if I miss a dose? It is important not to miss your dose. Call your doctor or health care professional if you are unable to keep an appointment. What may interact with this medication? This medicine may interact with the following medications: antiviral medicines for hepatitis, HIV or AIDS certain antibiotics like erythromycin and clarithromycin certain medicines for fungal infections like ketoconazole and itraconazole certain medicines for seizures like carbamazepine, phenobarbital, phenytoin gemfibrozil nefazodone rifampin St. John's wort This list may not describe all possible interactions. Give your health care provider a list of all the medicines, herbs, non-prescription drugs, or dietary supplements you use. Also tell them if you smoke, drink alcohol, or use illegal drugs. Some items may interact with your medicine. What should I watch for while using this medication? Your condition will be monitored carefully while you are receiving this medicine. You will need important blood work  done while you are taking this medicine. This medicine can cause serious allergic reactions. If you experience allergic reactions like skin rash, itching or hives, swelling of the face, lips, or tongue, tell your doctor or health care professional right away. In some cases, you may be given additional medicines to help with side effects. Follow all directions for their use. This drug may make you feel generally unwell. This is not uncommon, as chemotherapy can affect healthy cells as well as cancer cells. Report any side effects. Continue your course of treatment even though you feel ill unless your doctor tells you to stop. Call your doctor or health care professional for advice if you get a fever, chills or sore throat, or other symptoms of a cold or flu. Do not treat yourself. This drug decreases your body's ability to fight infections. Try to avoid being around people who are sick. This medicine may increase your risk to bruise or bleed. Call your doctor or health care professional if you notice any unusual bleeding. Be careful brushing and flossing your teeth or using a toothpick because you may get an infection or bleed more easily. If you have any dental work done, tell your dentist you are receiving this medicine. Avoid taking products that contain aspirin, acetaminophen, ibuprofen, naproxen, or ketoprofen unless instructed by your doctor. These medicines may hide a fever. Do not become pregnant while taking this medicine or for 6 months after  stopping it. Women should inform their doctor if they wish to become pregnant or think they might be pregnant. Men should not father a child while taking this medicine or for 3 months after stopping it. There is a potential for serious side effects to an unborn child. Talk to your health care professional or pharmacist for more information. Do not breast-feed an infant while taking this medicine or for 2 weeks after stopping it. This medicine may interfere  with the ability to get pregnant or to father a child. You should talk to your doctor or health care professional if you are concerned about your fertility. What side effects may I notice from receiving this medication? Side effects that you should report to your doctor or health care professional as soon as possible: allergic reactions like skin rash, itching or hives, swelling of the face, lips, or tongue breathing problems changes in vision fast, irregular heartbeat low blood pressure mouth sores pain, tingling, numbness in the hands or feet signs of decreased platelets or bleeding - bruising, pinpoint red spots on the skin, black, tarry stools, blood in the urine signs of decreased red blood cells - unusually weak or tired, feeling faint or lightheaded, falls signs of infection - fever or chills, cough, sore throat, pain or difficulty passing urine signs and symptoms of liver injury like dark yellow or brown urine; general ill feeling or flu-like symptoms; light-colored stools; loss of appetite; nausea; right upper belly pain; unusually weak or tired; yellowing of the eyes or skin swelling of the ankles, feet, hands unusually slow heartbeat Side effects that usually do not require medical attention (report to your doctor or health care professional if they continue or are bothersome): diarrhea hair loss loss of appetite nausea, vomiting tiredness This list may not describe all possible side effects. Call your doctor for medical advice about side effects. You may report side effects to FDA at 1-800-FDA-1088. Where should I keep my medication? This drug is given in a hospital or clinic and will not be stored at home. NOTE: This sheet is a summary. It may not cover all possible information. If you have questions about this medicine, talk to your doctor, pharmacist, or health care provider.  2022 [employer]/Gold Standard (2016-09-16 00:00:00)  Gemcitabine injection What is this  medication? GEMCITABINE (jem SYE ta been) is a chemotherapy drug. This medicine is used to treat many types of cancer like breast cancer, lung cancer, pancreatic cancer, and ovarian cancer. This medicine may be used for other purposes; ask your health care provider or pharmacist if you have questions. COMMON BRAND NAME(S): Gemzar, Infugem What should I tell my care team before I take this medication? They need to know if you have any of these conditions: blood disorders infection kidney disease liver disease lung or breathing disease, like asthma recent or ongoing radiation therapy an unusual or allergic reaction to gemcitabine, other chemotherapy, other medicines, foods, dyes, or preservatives pregnant or trying to get pregnant breast-feeding How should I use this medication? This drug is given as an infusion into a vein. It is administered in a hospital or clinic by a specially trained health care professional. Talk to your pediatrician regarding the use of this medicine in children. Special care may be needed. Overdosage: If you think you have taken too much of this medicine contact a poison control center or emergency room at once. NOTE: This medicine is only for you. Do not share this medicine with others. What if I miss a dose?  It is important not to miss your dose. Call your doctor or health care professional if you are unable to keep an appointment. What may interact with this medication? medicines to increase blood counts like filgrastim, pegfilgrastim, sargramostim some other chemotherapy drugs like cisplatin vaccines Talk to your doctor or health care professional before taking any of these medicines: acetaminophen aspirin ibuprofen ketoprofen naproxen This list may not describe all possible interactions. Give your health care provider a list of all the medicines, herbs, non-prescription drugs, or dietary supplements you use. Also tell them if you smoke, drink alcohol, or  use illegal drugs. Some items may interact with your medicine. What should I watch for while using this medication? Visit your doctor for checks on your progress. This drug may make you feel generally unwell. This is not uncommon, as chemotherapy can affect healthy cells as well as cancer cells. Report any side effects. Continue your course of treatment even though you feel ill unless your doctor tells you to stop. In some cases, you may be given additional medicines to help with side effects. Follow all directions for their use. Call your doctor or health care professional for advice if you get a fever, chills or sore throat, or other symptoms of a cold or flu. Do not treat yourself. This drug decreases your body's ability to fight infections. Try to avoid being around people who are sick. This medicine may increase your risk to bruise or bleed. Call your doctor or health care professional if you notice any unusual bleeding. Be careful brushing and flossing your teeth or using a toothpick because you may get an infection or bleed more easily. If you have any dental work done, tell your dentist you are receiving this medicine. Avoid taking products that contain aspirin, acetaminophen, ibuprofen, naproxen, or ketoprofen unless instructed by your doctor. These medicines may hide a fever. Do not become pregnant while taking this medicine or for 6 months after stopping it. Women should inform their doctor if they wish to become pregnant or think they might be pregnant. Men should not father a child while taking this medicine and for 3 months after stopping it. There is a potential for serious side effects to an unborn child. Talk to your health care professional or pharmacist for more information. Do not breast-feed an infant while taking this medicine or for at least 1 week after stopping it. Men should inform their doctors if they wish to father a child. This medicine may lower sperm counts. Talk with your  doctor or health care professional if you are concerned about your fertility. What side effects may I notice from receiving this medication? Side effects that you should report to your doctor or health care professional as soon as possible: allergic reactions like skin rash, itching or hives, swelling of the face, lips, or tongue breathing problems pain, redness, or irritation at site where injected signs and symptoms of a dangerous change in heartbeat or heart rhythm like chest pain; dizziness; fast or irregular heartbeat; palpitations; feeling faint or lightheaded, falls; breathing problems signs of decreased platelets or bleeding - bruising, pinpoint red spots on the skin, black, tarry stools, blood in the urine signs of decreased red blood cells - unusually weak or tired, feeling faint or lightheaded, falls signs of infection - fever or chills, cough, sore throat, pain or difficulty passing urine signs and symptoms of kidney injury like trouble passing urine or change in the amount of urine signs and symptoms of liver injury  like dark yellow or brown urine; general ill feeling or flu-like symptoms; light-colored stools; loss of appetite; nausea; right upper belly pain; unusually weak or tired; yellowing of the eyes or skin swelling of ankles, feet, hands Side effects that usually do not require medical attention (report to your doctor or health care professional if they continue or are bothersome): constipation diarrhea hair loss loss of appetite nausea rash vomiting This list may not describe all possible side effects. Call your doctor for medical advice about side effects. You may report side effects to FDA at 1-800-FDA-1088. Where should I keep my medication? This drug is given in a hospital or clinic and will not be stored at home. NOTE: This sheet is a summary. It may not cover all possible information. If you have questions about this medicine, talk to your doctor, pharmacist, or  health care provider.  2022 Elsevier/Gold Standard (2017-04-08 00:00:00)

## 2021-01-03 NOTE — Progress Notes (Signed)
Patient presents for treatment. RN assessment completed along with the following:  Labs/vitals reviewed - Plt 88, per Dr. Benay Spice: okay to proceed with treatment today.   Weight within 10% of previous measurement - Yes Oncology Treatment Attestation completed for current therapy- Yes, on date 10/25/2020 Informed consent completed and reflects current therapy/intent - Yes, on date 11/22/2020             Provider progress note reviewed - Yes, today's provider note was reviewed. Treatment/Antibody/Supportive plan reviewed - Yes, and there are no adjustments needed for today's treatment. S&H and other orders reviewed - Yes, and there are no additional orders identified. Previous treatment date reviewed - Yes, and the appropriate amount of time has elapsed between treatments. Clinic Hand Off Received from - No.  Patient to proceed with treatment.

## 2021-01-04 ENCOUNTER — Inpatient Hospital Stay: Payer: Medicare HMO

## 2021-01-04 VITALS — BP 107/71 | HR 73 | Temp 98.2°F | Resp 19

## 2021-01-04 DIAGNOSIS — Z5189 Encounter for other specified aftercare: Secondary | ICD-10-CM | POA: Diagnosis not present

## 2021-01-04 DIAGNOSIS — D696 Thrombocytopenia, unspecified: Secondary | ICD-10-CM | POA: Diagnosis not present

## 2021-01-04 DIAGNOSIS — C250 Malignant neoplasm of head of pancreas: Secondary | ICD-10-CM | POA: Diagnosis not present

## 2021-01-04 DIAGNOSIS — Z5111 Encounter for antineoplastic chemotherapy: Secondary | ICD-10-CM | POA: Diagnosis not present

## 2021-01-04 DIAGNOSIS — M25511 Pain in right shoulder: Secondary | ICD-10-CM | POA: Diagnosis not present

## 2021-01-04 DIAGNOSIS — Z791 Long term (current) use of non-steroidal anti-inflammatories (NSAID): Secondary | ICD-10-CM | POA: Diagnosis not present

## 2021-01-04 LAB — CANCER ANTIGEN 19-9: CA 19-9: 593 U/mL — ABNORMAL HIGH (ref 0–35)

## 2021-01-04 MED ORDER — PEGFILGRASTIM-CBQV 6 MG/0.6ML ~~LOC~~ SOSY
6.0000 mg | PREFILLED_SYRINGE | Freq: Once | SUBCUTANEOUS | Status: AC
  Administered 2021-01-04: 6 mg via SUBCUTANEOUS

## 2021-01-04 NOTE — Patient Instructions (Signed)
[facility]  Discharge Instructions: Thank you for choosing [facility] to provide your oncology and hematology care.   If you have a lab appointment with the Cancer Center, please go directly to the Cancer Center and check in at the registration area.   Wear comfortable clothing and clothing appropriate for easy access to any Portacath or PICC line.   We strive to give you quality time with your provider. You may need to reschedule your appointment if you arrive late (15 or more minutes).  Arriving late affects you and other patients whose appointments are after yours.  Also, if you miss three or more appointments without notifying the office, you may be dismissed from the clinic at the provider's discretion.      For prescription refill requests, have your pharmacy contact our office and allow 72 hours for refills to be completed.    Today you received the following Udenyca    To help prevent nausea and vomiting after your treatment, we encourage you to take your nausea medication as directed.  BELOW ARE SYMPTOMS THAT SHOULD BE REPORTED IMMEDIATELY: *FEVER GREATER THAN 100.4 F (38 C) OR HIGHER *CHILLS OR SWEATING *NAUSEA AND VOMITING THAT IS NOT CONTROLLED WITH YOUR NAUSEA MEDICATION *UNUSUAL SHORTNESS OF BREATH *UNUSUAL BRUISING OR BLEEDING *URINARY PROBLEMS (pain or burning when urinating, or frequent urination) *BOWEL PROBLEMS (unusual diarrhea, constipation, pain near the anus) TENDERNESS IN MOUTH AND THROAT WITH OR WITHOUT PRESENCE OF ULCERS (sore throat, sores in mouth, or a toothache) UNUSUAL RASH, SWELLING OR PAIN  UNUSUAL VAGINAL DISCHARGE OR ITCHING   Items with * indicate a potential emergency and should be followed up as soon as possible or go to the Emergency Department if any problems should occur.  Please show the CHEMOTHERAPY ALERT CARD or IMMUNOTHERAPY ALERT CARD at check-in to the Emergency Department and triage  nurse.  Should you have questions after your visit or need to cancel or reschedule your appointment, please contact [facility]  Dept: [phone]  and follow the prompts.  Office hours are 8:00 a.m. to 4:30 p.m. Monday - Friday. Please note that voicemails left after 4:00 p.m. may not be returned until the following business day.  We are closed weekends and major holidays. You have access to a nurse at all times for urgent questions. Please call the main number to the clinic Dept: [phone] and follow the prompts.   For any non-urgent questions, you may also contact your provider using MyChart. We now offer e-Visits for anyone 18 and older to request care online for non-urgent symptoms. For details visit [email or web address].   Also download the MyChart app! Go to the app store, search "MyChart", open the app, select [facility], and log in with your MyChart username and password.  Due to Covid, a mask is required upon entering the hospital/clinic. If you do not have a mask, one will be given to you upon arrival. For doctor visits, patients may have 1 support person aged 18 or older with them. For treatment visits, patients cannot have anyone with them due to current Covid guidelines and our immunocompromised population.   Pegfilgrastim Injection What is this medication? PEGFILGRASTIM (PEG fil gra stim) lowers the risk of infection in people who are receiving chemotherapy. It works by helping your body make more white blood cells, which protects your body from infection. It may also be used to help people who have been exposed to high doses of   radiation. This medicine may be used for other purposes; ask your health care provider or pharmacist if you have questions. COMMON BRAND NAME(S): Fulphila, Neulasta, Nyvepria, UDENYCA, Ziextenzo What should I tell my care team before I take this medication? They need to know if you have any of these conditions: Kidney  disease Latex allergy Ongoing radiation therapy Sickle cell disease Skin reactions to acrylic adhesives (On-Body Injector only) An unusual or allergic reaction to pegfilgrastim, filgrastim, other medications, foods, dyes, or preservatives Pregnant or trying to get pregnant Breast-feeding How should I use this medication? This medication is for injection under the skin. If you get this medication at home, you will be taught how to prepare and give the pre-filled syringe or how to use the On-body Injector. Refer to the patient Instructions for Use for detailed instructions. Use exactly as directed. Tell your care team immediately if you suspect that the On-body Injector may not have performed as intended or if you suspect the use of the On-body Injector resulted in a missed or partial dose. It is important that you put your used needles and syringes in a special sharps container. Do not put them in a trash can. If you do not have a sharps container, call your pharmacist or care team to get one. Talk to your care team about the use of this medication in children. While this medication may be prescribed for selected conditions, precautions do apply. Overdosage: If you think you have taken too much of this medicine contact a poison control center or emergency room at once. NOTE: This medicine is only for you. Do not share this medicine with others. What if I miss a dose? It is important not to miss your dose. Call your care team if you miss your dose. If you miss a dose due to an On-body Injector failure or leakage, a new dose should be administered as soon as possible using a single prefilled syringe for manual use. What may interact with this medication? Interactions have not been studied. This list may not describe all possible interactions. Give your health care provider a list of all the medicines, herbs, non-prescription drugs, or dietary supplements you use. Also tell them if you smoke, drink  alcohol, or use illegal drugs. Some items may interact with your medicine. What should I watch for while using this medication? Your condition will be monitored carefully while you are receiving this medication. You may need blood work done while you are taking this medication. Talk to your care team about your risk of cancer. You may be more at risk for certain types of cancer if you take this medication. If you are going to need a MRI, CT scan, or other procedure, tell your care team that you are using this medication (On-Body Injector only). What side effects may I notice from receiving this medication? Side effects that you should report to your care team as soon as possible: Allergic reactions--skin rash, itching, hives, swelling of the face, lips, tongue, or throat Capillary leak syndrome--stomach or muscle pain, unusual weakness or fatigue, feeling faint or lightheaded, decrease in the amount of urine, swelling of the ankles, hands, or feet, trouble breathing High white blood cell level--fever, fatigue, trouble breathing, night sweats, change in vision, weight loss Inflammation of the aorta--fever, fatigue, back, chest, or stomach pain, severe headache Kidney injury (glomerulonephritis)--decrease in the amount of urine, red or dark brown urine, foamy or bubbly urine, swelling of the ankles, hands, or feet Shortness of breath or   trouble breathing Spleen injury--pain in upper left stomach or shoulder Unusual bruising or bleeding Side effects that usually do not require medical attention (report to your care team if they continue or are bothersome): Bone pain Pain in the hands or feet This list may not describe all possible side effects. Call your doctor for medical advice about side effects. You may report side effects to FDA at 1-800-FDA-1088. Where should I keep my medication? Keep out of the reach of children. If you are using this medication at home, you will be instructed on how to  store it. Throw away any unused medication after the expiration date on the label. NOTE: This sheet is a summary. It may not cover all possible information. If you have questions about this medicine, talk to your doctor, pharmacist, or health care provider.  2022 Elsevier/Gold Standard (2020-10-02 00:00:00)  

## 2021-01-09 ENCOUNTER — Other Ambulatory Visit: Payer: Self-pay | Admitting: Family Medicine

## 2021-01-09 DIAGNOSIS — E785 Hyperlipidemia, unspecified: Secondary | ICD-10-CM

## 2021-01-13 ENCOUNTER — Other Ambulatory Visit: Payer: Self-pay | Admitting: Oncology

## 2021-01-17 ENCOUNTER — Encounter: Payer: Self-pay | Admitting: Nurse Practitioner

## 2021-01-17 ENCOUNTER — Inpatient Hospital Stay: Payer: Medicare HMO

## 2021-01-17 ENCOUNTER — Other Ambulatory Visit: Payer: Self-pay | Admitting: Gastroenterology

## 2021-01-17 ENCOUNTER — Other Ambulatory Visit: Payer: Self-pay

## 2021-01-17 ENCOUNTER — Inpatient Hospital Stay (HOSPITAL_BASED_OUTPATIENT_CLINIC_OR_DEPARTMENT_OTHER): Payer: Medicare HMO | Admitting: Nurse Practitioner

## 2021-01-17 ENCOUNTER — Other Ambulatory Visit: Payer: Self-pay | Admitting: Nurse Practitioner

## 2021-01-17 VITALS — BP 137/72 | HR 69 | Temp 98.1°F | Resp 18 | Ht 68.0 in | Wt 174.8 lb

## 2021-01-17 VITALS — BP 141/81 | HR 63 | Temp 97.7°F | Resp 18

## 2021-01-17 DIAGNOSIS — C250 Malignant neoplasm of head of pancreas: Secondary | ICD-10-CM

## 2021-01-17 DIAGNOSIS — Z5111 Encounter for antineoplastic chemotherapy: Secondary | ICD-10-CM | POA: Diagnosis not present

## 2021-01-17 DIAGNOSIS — Z791 Long term (current) use of non-steroidal anti-inflammatories (NSAID): Secondary | ICD-10-CM | POA: Diagnosis not present

## 2021-01-17 DIAGNOSIS — D696 Thrombocytopenia, unspecified: Secondary | ICD-10-CM | POA: Diagnosis not present

## 2021-01-17 DIAGNOSIS — M25511 Pain in right shoulder: Secondary | ICD-10-CM | POA: Diagnosis not present

## 2021-01-17 DIAGNOSIS — Z5189 Encounter for other specified aftercare: Secondary | ICD-10-CM | POA: Diagnosis not present

## 2021-01-17 LAB — CBC WITH DIFFERENTIAL (CANCER CENTER ONLY)
Abs Immature Granulocytes: 0.57 10*3/uL — ABNORMAL HIGH (ref 0.00–0.07)
Basophils Absolute: 0 10*3/uL (ref 0.0–0.1)
Basophils Relative: 0 %
Eosinophils Absolute: 0 10*3/uL (ref 0.0–0.5)
Eosinophils Relative: 0 %
HCT: 33.9 % — ABNORMAL LOW (ref 39.0–52.0)
Hemoglobin: 11 g/dL — ABNORMAL LOW (ref 13.0–17.0)
Immature Granulocytes: 4 %
Lymphocytes Relative: 24 %
Lymphs Abs: 3.6 10*3/uL (ref 0.7–4.0)
MCH: 30.3 pg (ref 26.0–34.0)
MCHC: 32.4 g/dL (ref 30.0–36.0)
MCV: 93.4 fL (ref 80.0–100.0)
Monocytes Absolute: 0.9 10*3/uL (ref 0.1–1.0)
Monocytes Relative: 6 %
Neutro Abs: 9.8 10*3/uL — ABNORMAL HIGH (ref 1.7–7.7)
Neutrophils Relative %: 66 %
Platelet Count: 67 10*3/uL — ABNORMAL LOW (ref 150–400)
RBC: 3.63 MIL/uL — ABNORMAL LOW (ref 4.22–5.81)
RDW: 18.3 % — ABNORMAL HIGH (ref 11.5–15.5)
WBC Count: 14.9 10*3/uL — ABNORMAL HIGH (ref 4.0–10.5)
nRBC: 0 % (ref 0.0–0.2)

## 2021-01-17 LAB — CMP (CANCER CENTER ONLY)
ALT: 16 U/L (ref 0–44)
AST: 14 U/L — ABNORMAL LOW (ref 15–41)
Albumin: 3.8 g/dL (ref 3.5–5.0)
Alkaline Phosphatase: 154 U/L — ABNORMAL HIGH (ref 38–126)
Anion gap: 7 (ref 5–15)
BUN: 14 mg/dL (ref 8–23)
CO2: 27 mmol/L (ref 22–32)
Calcium: 8.4 mg/dL — ABNORMAL LOW (ref 8.9–10.3)
Chloride: 103 mmol/L (ref 98–111)
Creatinine: 0.66 mg/dL (ref 0.61–1.24)
GFR, Estimated: >60 mL/min (ref >60)
Glucose, Bld: 257 mg/dL — ABNORMAL HIGH (ref 70–99)
Potassium: 3.8 mmol/L (ref 3.5–5.1)
Sodium: 137 mmol/L (ref 135–145)
Total Bilirubin: 0.5 mg/dL (ref 0.3–1.2)
Total Protein: 5.7 g/dL — ABNORMAL LOW (ref 6.5–8.1)

## 2021-01-17 MED ORDER — PACLITAXEL PROTEIN-BOUND CHEMO INJECTION 100 MG
100.0000 mg/m2 | Freq: Once | INTRAVENOUS | Status: AC
  Administered 2021-01-17: 10:00:00 200 mg via INTRAVENOUS
  Filled 2021-01-17: qty 40

## 2021-01-17 MED ORDER — SODIUM CHLORIDE 0.9 % IV SOLN: Freq: Once | INTRAVENOUS | Status: AC

## 2021-01-17 MED ORDER — OMEPRAZOLE 20 MG PO CPDR: 40.0000 mg | DELAYED_RELEASE_CAPSULE | Freq: Every day | ORAL | 2 refills | Status: DC

## 2021-01-17 MED ORDER — SODIUM CHLORIDE 0.9% FLUSH
10.0000 mL | INTRAVENOUS | Status: DC | PRN
  Administered 2021-01-17: 12:00:00 10 mL

## 2021-01-17 MED ORDER — SODIUM CHLORIDE 0.9 % IV SOLN
800.0000 mg/m2 | Freq: Once | INTRAVENOUS | Status: AC
  Administered 2021-01-17: 11:00:00 1520 mg via INTRAVENOUS
  Filled 2021-01-17: qty 15.78

## 2021-01-17 MED ORDER — HEPARIN SOD (PORK) LOCK FLUSH 100 UNIT/ML IV SOLN
500.0000 [IU] | Freq: Once | INTRAVENOUS | Status: AC | PRN
  Administered 2021-01-17: 12:00:00 500 [IU]

## 2021-01-17 MED ORDER — PROCHLORPERAZINE MALEATE 10 MG PO TABS
10.0000 mg | ORAL_TABLET | Freq: Once | ORAL | Status: AC
  Administered 2021-01-17: 10:00:00 10 mg via ORAL
  Filled 2021-01-17: qty 1

## 2021-01-17 NOTE — Progress Notes (Signed)
Patient seen by Lisa Delton NP today  Vitals are within treatment parameters.  Labs reviewed by Lisa Delon NP and are within treatment parameters.  Per physician team, patient is ready for treatment and there are NO modifications to the treatment plan.     

## 2021-01-17 NOTE — Progress Notes (Signed)
[facility] OFFICE PROGRESS NOTE   Diagnosis: Pancreas cancer  INTERVAL HISTORY:   Nathan Mora returns as scheduled.  He completed cycle 4 gemcitabine/Abraxane 01/03/2021.  He notes more fatigue.  He denies nausea/vomiting.  No mouth sores.  No diarrhea.  He has stable to improved numbness at the left thumb.  He denies bleeding.  Objective:  Vital signs in last 24 hours:  Blood pressure 137/72, pulse 69, temperature 98.1 F (36.7 C), temperature source Oral, resp. rate 18, height '5\' 8"'  (1.727 m), weight 174 lb 12.8 oz (79.3 kg), SpO2 100 %.    HEENT: No thrush or ulcers. Resp: Lungs clear bilaterally. Cardio: Regular rate and rhythm. GI: Abdomen soft and nontender.  No hepatomegaly. Vascular: No leg edema. Port-A-Cath without erythema.  Lab Results:  Lab Results  Component Value Date   WBC 14.9 (H) 01/17/2021   HGB 11.0 (L) 01/17/2021   HCT 33.9 (L) 01/17/2021   MCV 93.4 01/17/2021   PLT 67 (L) 01/17/2021   NEUTROABS 9.8 (H) 01/17/2021    Imaging:  No results found.  Medications: I have reviewed the patient's current medications.  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  Fat planes around the celiac axis preserved.  Less than 90 degree contact of the lateral wall of the SMA, no evidence of metastatic disease, 3 mm left lower lobe pulmonary nodule Cycle 1 FOLFOX  05/09/2020 (plan to add irinotecan with cycle 2 pending tolerance) Cycle 2 FOLFIRINOX 05/23/2020 Cycle 3 FOLFIRINOX 06/06/2020, oxaliplatin dose reduced secondary to thrombocytopenia Cycle 4 FOLFIRINOX 06/27/2020, oxaliplatin further dose reduced secondary to thrombocytopenia CTs at [facility] 07/03/2020-redemonstration of a heterogeneous pancreatic head mass with borderline encasement of the SMA as well as abutment of the portal vein, SMV and likely the IVC.  Similar mildly enlarged porta hepatis and pericaval lymph nodes.  No convincing evidence of distant metastatic disease. 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  More than 180 degree involvement of the SMV and SMA Cycle 1 gemcitabine/Abraxane 11/22/2020, [facility] counselor contacted him 11/26/2020-no pathogenic variants identified in the 84 genes analyzed.  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 Cycle 2 gemcitabine/Abraxane 12/06/2020, Udenyca Cycle 3 gemcitabine/Abraxane 12/19/2020, Udenyca Cycle 4 gemcitabine/Abraxane 01/03/2021, Udenyca Cycle 5 gemcitabine/Abraxane 01/17/2021, Udenyca Obstructive jaundice secondary #1 ERCP 04/11/2020-prominent and floppy major papilla, dilated bile duct secondary to a distal stricture, placement of an uncovered metal stent, bile duct brushings-reactive glandular cells 3.   Diabetes   4.   Hyperlipidemia   5.   Glaucoma   6.  Port-A-Cath placement 05/04/2020 Interventional Radiology   7.   Mild thrombocytopenia (121,000) 05/09/2020    Disposition: [name] appears stable.  He has completed 4 cycles of gemcitabine/Abraxane, overall tolerating well.  Plan to proceed with cycle 5 today as scheduled.  We reviewed the CBC from today.  Counts adequate to proceed with treatment.  He has moderate thrombocytopenia.  He understands to contact the office with bleeding.  He will return for lab, follow-up, cycle 6 gemcitabine/Abraxane in 2 weeks.  He will contact the office in the interim as outlined above or with any other problems.  Plan reviewed with Dr. Benay Spice.    Nathan Mora ANP/GNP-BC   01/17/2021  9:23 AM

## 2021-01-17 NOTE — Patient Instructions (Signed)
Nathan Mora   Discharge Instructions: Thank you for choosing [facility] to provide your oncology and hematology care.   If you have a lab appointment with the [facility], please go directly to the [facility] and check in at the registration area.   Wear comfortable clothing and clothing appropriate for easy access to any Portacath or PICC line.   We strive to give you quality time with your provider. You may need to reschedule your appointment if you arrive late (15 or more minutes).  Arriving late affects you and other patients whose appointments are after yours.  Also, if you miss three or more appointments without notifying the office, you may be dismissed from the clinic at the providers discretion.      For prescription refill requests, have your pharmacy contact our office and allow 72 hours for refills to be completed.    Today you received the following chemotherapy and/or immunotherapy agents Paclitaxel-Protein bound (ABRAXANE) & Gemcitabine (GEMZAR).      To help prevent nausea and vomiting after your treatment, we encourage you to take your nausea medication as directed.  BELOW ARE SYMPTOMS THAT SHOULD BE REPORTED IMMEDIATELY: *FEVER GREATER THAN 100.4 F (38 C) OR HIGHER *CHILLS OR SWEATING *NAUSEA AND VOMITING THAT IS NOT CONTROLLED WITH YOUR NAUSEA MEDICATION *UNUSUAL SHORTNESS OF BREATH *UNUSUAL BRUISING OR BLEEDING *URINARY PROBLEMS (pain or burning when urinating, or frequent urination) *BOWEL PROBLEMS (unusual diarrhea, constipation, pain near the anus) TENDERNESS IN MOUTH AND THROAT WITH OR WITHOUT PRESENCE OF ULCERS (sore throat, sores in mouth, or a toothache) UNUSUAL RASH, SWELLING OR PAIN  UNUSUAL VAGINAL DISCHARGE OR ITCHING   Items with * indicate a potential emergency and should be followed up as soon as possible or go to the Emergency Department if any problems should occur.  Please show the CHEMOTHERAPY ALERT  CARD or IMMUNOTHERAPY ALERT CARD at check-in to the Emergency Department and triage nurse.  Should you have questions after your visit or need to cancel or reschedule your appointment, please contact [facility]  Dept: [phone]  and follow the prompts.  Office hours are 8:00 a.m. to 4:30 p.m. Monday - Friday. Please note that voicemails left after 4:00 p.m. may not be returned until the following business day.  We are closed weekends and major holidays. You have access to a nurse at all times for urgent questions. Please call the main number to the clinic Dept: [phone] and follow the prompts.   For any non-urgent questions, you may also contact your provider using MyChart. We now offer e-Visits for anyone 3 and older to request care online for non-urgent symptoms. For details visit [email or web address].   Also download the MyChart app! Go to the app store, search "MyChart", open the app, select [facility], and log in with your MyChart username and password.  Due to Covid, a mask is required upon entering the hospital/clinic. If you do not have a mask, one will be given to you upon arrival. For doctor visits, patients may have 1 support person aged 98 or older with them. For treatment visits, patients cannot have anyone with them due to current Covid guidelines and our immunocompromised population.   Nanoparticle Albumin-Bound Paclitaxel injection What is this medication? NANOPARTICLE ALBUMIN-BOUND PACLITAXEL (Na no PAHR ti kuhl  al BYOO muhn-bound  PAK li TAX el) is a chemotherapy drug. It targets fast dividing cells, like cancer cells, and causes these cells to die.  This medicine is used to treat advanced breast cancer, lung cancer, and pancreatic cancer. This medicine may be used for other purposes; ask your health care provider or pharmacist if you have questions. COMMON BRAND NAME(S): Abraxane What should I tell my care team before I take this  medication? They need to know if you have any of these conditions: kidney disease liver disease low blood counts, like low white cell, platelet, or red cell counts lung or breathing disease, like asthma tingling of the fingers or toes, or other nerve disorder an unusual or allergic reaction to paclitaxel, albumin, other chemotherapy, other medicines, foods, dyes, or preservatives pregnant or trying to get pregnant breast-feeding How should I use this medication? This drug is given as an infusion into a vein. It is administered in a hospital or clinic by a specially trained health care professional. Talk to your pediatrician regarding the use of this medicine in children. Special care may be needed. Overdosage: If you think you have taken too much of this medicine contact a poison control center or emergency room at once. NOTE: This medicine is only for you. Do not share this medicine with others. What if I miss a dose? It is important not to miss your dose. Call your doctor or health care professional if you are unable to keep an appointment. What may interact with this medication? This medicine may interact with the following medications: antiviral medicines for hepatitis, HIV or AIDS certain antibiotics like erythromycin and clarithromycin certain medicines for fungal infections like ketoconazole and itraconazole certain medicines for seizures like carbamazepine, phenobarbital, phenytoin gemfibrozil nefazodone rifampin St. John's wort This list may not describe all possible interactions. Give your health care provider a list of all the medicines, herbs, non-prescription drugs, or dietary supplements you use. Also tell them if you smoke, drink alcohol, or use illegal drugs. Some items may interact with your medicine. What should I watch for while using this medication? Your condition will be monitored carefully while you are receiving this medicine. You will need important blood work  done while you are taking this medicine. This medicine can cause serious allergic reactions. If you experience allergic reactions like skin rash, itching or hives, swelling of the face, lips, or tongue, tell your doctor or health care professional right away. In some cases, you may be given additional medicines to help with side effects. Follow all directions for their use. This drug may make you feel generally unwell. This is not uncommon, as chemotherapy can affect healthy cells as well as cancer cells. Report any side effects. Continue your course of treatment even though you feel ill unless your doctor tells you to stop. Call your doctor or health care professional for advice if you get a fever, chills or sore throat, or other symptoms of a cold or flu. Do not treat yourself. This drug decreases your body's ability to fight infections. Try to avoid being around people who are sick. This medicine may increase your risk to bruise or bleed. Call your doctor or health care professional if you notice any unusual bleeding. Be careful brushing and flossing your teeth or using a toothpick because you may get an infection or bleed more easily. If you have any dental work done, tell your dentist you are receiving this medicine. Avoid taking products that contain aspirin, acetaminophen, ibuprofen, naproxen, or ketoprofen unless instructed by your doctor. These medicines may hide a fever. Do not become pregnant while taking this medicine or for 6 months after  stopping it. Women should inform their doctor if they wish to become pregnant or think they might be pregnant. Men should not father a child while taking this medicine or for 3 months after stopping it. There is a potential for serious side effects to an unborn child. Talk to your health care professional or pharmacist for more information. Do not breast-feed an infant while taking this medicine or for 2 weeks after stopping it. This medicine may interfere  with the ability to get pregnant or to father a child. You should talk to your doctor or health care professional if you are concerned about your fertility. What side effects may I notice from receiving this medication? Side effects that you should report to your doctor or health care professional as soon as possible: allergic reactions like skin rash, itching or hives, swelling of the face, lips, or tongue breathing problems changes in vision fast, irregular heartbeat low blood pressure mouth sores pain, tingling, numbness in the hands or feet signs of decreased platelets or bleeding - bruising, pinpoint red spots on the skin, black, tarry stools, blood in the urine signs of decreased red blood cells - unusually weak or tired, feeling faint or lightheaded, falls signs of infection - fever or chills, cough, sore throat, pain or difficulty passing urine signs and symptoms of liver injury like dark yellow or brown urine; general ill feeling or flu-like symptoms; light-colored stools; loss of appetite; nausea; right upper belly pain; unusually weak or tired; yellowing of the eyes or skin swelling of the ankles, feet, hands unusually slow heartbeat Side effects that usually do not require medical attention (report to your doctor or health care professional if they continue or are bothersome): diarrhea hair loss loss of appetite nausea, vomiting tiredness This list may not describe all possible side effects. Call your doctor for medical advice about side effects. You may report side effects to FDA at 1-800-FDA-1088. Where should I keep my medication? This drug is given in a hospital or clinic and will not be stored at home. NOTE: This sheet is a summary. It may not cover all possible information. If you have questions about this medicine, talk to your doctor, pharmacist, or health care provider.  2022 [employer]/Gold Standard (2016-09-16 00:00:00)  Gemcitabine injection What is this  medication? GEMCITABINE (jem SYE ta been) is a chemotherapy drug. This medicine is used to treat many types of cancer like breast cancer, lung cancer, pancreatic cancer, and ovarian cancer. This medicine may be used for other purposes; ask your health care provider or pharmacist if you have questions. COMMON BRAND NAME(S): Gemzar, Infugem What should I tell my care team before I take this medication? They need to know if you have any of these conditions: blood disorders infection kidney disease liver disease lung or breathing disease, like asthma recent or ongoing radiation therapy an unusual or allergic reaction to gemcitabine, other chemotherapy, other medicines, foods, dyes, or preservatives pregnant or trying to get pregnant breast-feeding How should I use this medication? This drug is given as an infusion into a vein. It is administered in a hospital or clinic by a specially trained health care professional. Talk to your pediatrician regarding the use of this medicine in children. Special care may be needed. Overdosage: If you think you have taken too much of this medicine contact a poison control center or emergency room at once. NOTE: This medicine is only for you. Do not share this medicine with others. What if I miss a dose?  It is important not to miss your dose. Call your doctor or health care professional if you are unable to keep an appointment. What may interact with this medication? medicines to increase blood counts like filgrastim, pegfilgrastim, sargramostim some other chemotherapy drugs like cisplatin vaccines Talk to your doctor or health care professional before taking any of these medicines: acetaminophen aspirin ibuprofen ketoprofen naproxen This list may not describe all possible interactions. Give your health care provider a list of all the medicines, herbs, non-prescription drugs, or dietary supplements you use. Also tell them if you smoke, drink alcohol, or  use illegal drugs. Some items may interact with your medicine. What should I watch for while using this medication? Visit your doctor for checks on your progress. This drug may make you feel generally unwell. This is not uncommon, as chemotherapy can affect healthy cells as well as cancer cells. Report any side effects. Continue your course of treatment even though you feel ill unless your doctor tells you to stop. In some cases, you may be given additional medicines to help with side effects. Follow all directions for their use. Call your doctor or health care professional for advice if you get a fever, chills or sore throat, or other symptoms of a cold or flu. Do not treat yourself. This drug decreases your body's ability to fight infections. Try to avoid being around people who are sick. This medicine may increase your risk to bruise or bleed. Call your doctor or health care professional if you notice any unusual bleeding. Be careful brushing and flossing your teeth or using a toothpick because you may get an infection or bleed more easily. If you have any dental work done, tell your dentist you are receiving this medicine. Avoid taking products that contain aspirin, acetaminophen, ibuprofen, naproxen, or ketoprofen unless instructed by your doctor. These medicines may hide a fever. Do not become pregnant while taking this medicine or for 6 months after stopping it. Women should inform their doctor if they wish to become pregnant or think they might be pregnant. Men should not father a child while taking this medicine and for 3 months after stopping it. There is a potential for serious side effects to an unborn child. Talk to your health care professional or pharmacist for more information. Do not breast-feed an infant while taking this medicine or for at least 1 week after stopping it. Men should inform their doctors if they wish to father a child. This medicine may lower sperm counts. Talk with your  doctor or health care professional if you are concerned about your fertility. What side effects may I notice from receiving this medication? Side effects that you should report to your doctor or health care professional as soon as possible: allergic reactions like skin rash, itching or hives, swelling of the face, lips, or tongue breathing problems pain, redness, or irritation at site where injected signs and symptoms of a dangerous change in heartbeat or heart rhythm like chest pain; dizziness; fast or irregular heartbeat; palpitations; feeling faint or lightheaded, falls; breathing problems signs of decreased platelets or bleeding - bruising, pinpoint red spots on the skin, black, tarry stools, blood in the urine signs of decreased red blood cells - unusually weak or tired, feeling faint or lightheaded, falls signs of infection - fever or chills, cough, sore throat, pain or difficulty passing urine signs and symptoms of kidney injury like trouble passing urine or change in the amount of urine signs and symptoms of liver injury  like dark yellow or brown urine; general ill feeling or flu-like symptoms; light-colored stools; loss of appetite; nausea; right upper belly pain; unusually weak or tired; yellowing of the eyes or skin swelling of ankles, feet, hands Side effects that usually do not require medical attention (report to your doctor or health care professional if they continue or are bothersome): constipation diarrhea hair loss loss of appetite nausea rash vomiting This list may not describe all possible side effects. Call your doctor for medical advice about side effects. You may report side effects to FDA at 1-800-FDA-1088. Where should I keep my medication? This drug is given in a hospital or clinic and will not be stored at home. NOTE: This sheet is a summary. It may not cover all possible information. If you have questions about this medicine, talk to your doctor, pharmacist, or  health care provider.  2022 [employer]vier/Gold Standard (2017-04-08 00:00:00)

## 2021-01-17 NOTE — Progress Notes (Signed)
Patient presents for treatment. RN assessment completed along with the following:  Labs/vitals reviewed - Yes, and Per [name], NP ok to treat with platelets 67.     Weight within 10% of previous measurement - Yes Oncology Treatment Attestation completed for current therapy- Yes, on date 07/25/20 Informed consent completed and reflects current therapy/intent - Yes, on date 11/22/20             Provider progress note reviewed - Yes, today's provider note was reviewed. Treatment/Antibody/Supportive plan reviewed - Yes, and there are no adjustments needed for today's treatment. S&H and other orders reviewed - Yes, and there are no additional orders identified. Previous treatment date reviewed - Yes, and the appropriate amount of time has elapsed between treatments. Clinic Hand Off Received from - [name], NP  Patient to proceed with treatment.

## 2021-01-18 ENCOUNTER — Inpatient Hospital Stay: Payer: Medicare HMO

## 2021-01-18 VITALS — BP 124/71 | HR 83 | Temp 98.3°F | Resp 18

## 2021-01-18 DIAGNOSIS — Z791 Long term (current) use of non-steroidal anti-inflammatories (NSAID): Secondary | ICD-10-CM | POA: Diagnosis not present

## 2021-01-18 DIAGNOSIS — C250 Malignant neoplasm of head of pancreas: Secondary | ICD-10-CM

## 2021-01-18 DIAGNOSIS — M25511 Pain in right shoulder: Secondary | ICD-10-CM | POA: Diagnosis not present

## 2021-01-18 DIAGNOSIS — D696 Thrombocytopenia, unspecified: Secondary | ICD-10-CM | POA: Diagnosis not present

## 2021-01-18 DIAGNOSIS — Z5189 Encounter for other specified aftercare: Secondary | ICD-10-CM | POA: Diagnosis not present

## 2021-01-18 DIAGNOSIS — Z5111 Encounter for antineoplastic chemotherapy: Secondary | ICD-10-CM | POA: Diagnosis not present

## 2021-01-18 LAB — CANCER ANTIGEN 19-9: CA 19-9: 432 U/mL — ABNORMAL HIGH (ref 0–35)

## 2021-01-18 MED ORDER — PEGFILGRASTIM-CBQV 6 MG/0.6ML ~~LOC~~ SOSY
6.0000 mg | PREFILLED_SYRINGE | Freq: Once | SUBCUTANEOUS | Status: AC
  Administered 2021-01-18: 12:00:00 6 mg via SUBCUTANEOUS

## 2021-01-18 NOTE — Patient Instructions (Signed)

## 2021-01-28 ENCOUNTER — Other Ambulatory Visit: Payer: Self-pay | Admitting: Oncology

## 2021-01-30 ENCOUNTER — Other Ambulatory Visit: Payer: Self-pay | Admitting: *Deleted

## 2021-01-30 DIAGNOSIS — C250 Malignant neoplasm of head of pancreas: Secondary | ICD-10-CM

## 2021-01-31 ENCOUNTER — Inpatient Hospital Stay: Payer: Medicare HMO | Attending: Oncology

## 2021-01-31 ENCOUNTER — Telehealth: Payer: Self-pay

## 2021-01-31 ENCOUNTER — Inpatient Hospital Stay: Payer: Medicare HMO

## 2021-01-31 ENCOUNTER — Inpatient Hospital Stay (HOSPITAL_BASED_OUTPATIENT_CLINIC_OR_DEPARTMENT_OTHER): Payer: Medicare HMO | Admitting: Oncology

## 2021-01-31 ENCOUNTER — Other Ambulatory Visit: Payer: Self-pay

## 2021-01-31 VITALS — BP 136/84 | HR 65 | Temp 98.0°F | Resp 20

## 2021-01-31 VITALS — BP 122/76 | HR 84 | Temp 97.8°F | Resp 18 | Ht 68.0 in | Wt 172.0 lb

## 2021-01-31 DIAGNOSIS — K831 Obstruction of bile duct: Secondary | ICD-10-CM | POA: Insufficient documentation

## 2021-01-31 DIAGNOSIS — C250 Malignant neoplasm of head of pancreas: Secondary | ICD-10-CM

## 2021-01-31 DIAGNOSIS — E119 Type 2 diabetes mellitus without complications: Secondary | ICD-10-CM | POA: Diagnosis not present

## 2021-01-31 DIAGNOSIS — E785 Hyperlipidemia, unspecified: Secondary | ICD-10-CM | POA: Diagnosis not present

## 2021-01-31 DIAGNOSIS — Z79899 Other long term (current) drug therapy: Secondary | ICD-10-CM | POA: Diagnosis not present

## 2021-01-31 DIAGNOSIS — Z5111 Encounter for antineoplastic chemotherapy: Secondary | ICD-10-CM | POA: Diagnosis not present

## 2021-01-31 LAB — CBC WITH DIFFERENTIAL (CANCER CENTER ONLY)
Abs Immature Granulocytes: 0.64 10*3/uL — ABNORMAL HIGH (ref 0.00–0.07)
Basophils Absolute: 0 10*3/uL (ref 0.0–0.1)
Basophils Relative: 0 %
Eosinophils Absolute: 0 10*3/uL (ref 0.0–0.5)
Eosinophils Relative: 0 %
HCT: 34.4 % — ABNORMAL LOW (ref 39.0–52.0)
Hemoglobin: 11.2 g/dL — ABNORMAL LOW (ref 13.0–17.0)
Immature Granulocytes: 4 %
Lymphocytes Relative: 23 %
Lymphs Abs: 3.4 10*3/uL (ref 0.7–4.0)
MCH: 30.9 pg (ref 26.0–34.0)
MCHC: 32.6 g/dL (ref 30.0–36.0)
MCV: 95 fL (ref 80.0–100.0)
Monocytes Absolute: 1.1 10*3/uL — ABNORMAL HIGH (ref 0.1–1.0)
Monocytes Relative: 8 %
Neutro Abs: 9.5 10*3/uL — ABNORMAL HIGH (ref 1.7–7.7)
Neutrophils Relative %: 65 %
Platelet Count: 84 10*3/uL — ABNORMAL LOW (ref 150–400)
RBC: 3.62 MIL/uL — ABNORMAL LOW (ref 4.22–5.81)
RDW: 18.8 % — ABNORMAL HIGH (ref 11.5–15.5)
WBC Count: 14.7 10*3/uL — ABNORMAL HIGH (ref 4.0–10.5)
nRBC: 0 % (ref 0.0–0.2)

## 2021-01-31 LAB — CMP (CANCER CENTER ONLY)
ALT: 22 U/L (ref 0–44)
AST: 19 U/L (ref 15–41)
Albumin: 4.1 g/dL (ref 3.5–5.0)
Alkaline Phosphatase: 191 U/L — ABNORMAL HIGH (ref 38–126)
Anion gap: 9 (ref 5–15)
BUN: 14 mg/dL (ref 8–23)
CO2: 25 mmol/L (ref 22–32)
Calcium: 8.5 mg/dL — ABNORMAL LOW (ref 8.9–10.3)
Chloride: 103 mmol/L (ref 98–111)
Creatinine: 0.69 mg/dL (ref 0.61–1.24)
GFR, Estimated: >60 mL/min (ref >60)
Glucose, Bld: 251 mg/dL — ABNORMAL HIGH (ref 70–99)
Potassium: 3.9 mmol/L (ref 3.5–5.1)
Sodium: 137 mmol/L (ref 135–145)
Total Bilirubin: 0.5 mg/dL (ref 0.3–1.2)
Total Protein: 6.1 g/dL — ABNORMAL LOW (ref 6.5–8.1)

## 2021-01-31 MED ORDER — SODIUM CHLORIDE 0.9 % IV SOLN: Freq: Once | INTRAVENOUS | Status: AC

## 2021-01-31 MED ORDER — PACLITAXEL PROTEIN-BOUND CHEMO INJECTION 100 MG
100.0000 mg/m2 | Freq: Once | INTRAVENOUS | Status: AC
  Administered 2021-01-31: 200 mg via INTRAVENOUS
  Filled 2021-01-31: qty 40

## 2021-01-31 MED ORDER — SODIUM CHLORIDE 0.9% FLUSH
10.0000 mL | INTRAVENOUS | Status: DC | PRN
  Administered 2021-01-31: 10 mL

## 2021-01-31 MED ORDER — PROCHLORPERAZINE MALEATE 10 MG PO TABS
10.0000 mg | ORAL_TABLET | Freq: Once | ORAL | Status: AC
  Administered 2021-01-31: 10 mg via ORAL
  Filled 2021-01-31: qty 1

## 2021-01-31 MED ORDER — HEPARIN SOD (PORK) LOCK FLUSH 100 UNIT/ML IV SOLN
500.0000 [IU] | Freq: Once | INTRAVENOUS | Status: AC | PRN
  Administered 2021-01-31: 500 [IU]

## 2021-01-31 MED ORDER — SODIUM CHLORIDE 0.9 % IV SOLN
800.0000 mg/m2 | Freq: Once | INTRAVENOUS | Status: AC
  Administered 2021-01-31: 1520 mg via INTRAVENOUS
  Filled 2021-01-31: qty 26.3

## 2021-01-31 NOTE — Patient Instructions (Signed)
[facility]   Discharge Instructions: Thank you for choosing [facility] to provide your oncology and hematology care.   If you have a lab appointment with the [facility], please go directly to the [facility] and check in at the registration area.   Wear comfortable clothing and clothing appropriate for easy access to any Portacath or PICC line.   We strive to give you quality time with your provider. You may need to reschedule your appointment if you arrive late (15 or more minutes).  Arriving late affects you and other patients whose appointments are after yours.  Also, if you miss three or more appointments without notifying the office, you may be dismissed from the clinic at the providers discretion.      For prescription refill requests, have your pharmacy contact our office and allow 72 hours for refills to be completed.    Today you received the following chemotherapy and/or immunotherapy agents Paclitaxel-protein bound (ABRAXANE) & Gemcitabine (GEMZAR).      To help prevent nausea and vomiting after your treatment, we encourage you to take your nausea medication as directed.  BELOW ARE SYMPTOMS THAT SHOULD BE REPORTED IMMEDIATELY: *FEVER GREATER THAN 100.4 F (38 C) OR HIGHER *CHILLS OR SWEATING *NAUSEA AND VOMITING THAT IS NOT CONTROLLED WITH YOUR NAUSEA MEDICATION *UNUSUAL SHORTNESS OF BREATH *UNUSUAL BRUISING OR BLEEDING *URINARY PROBLEMS (pain or burning when urinating, or frequent urination) *BOWEL PROBLEMS (unusual diarrhea, constipation, pain near the anus) TENDERNESS IN MOUTH AND THROAT WITH OR WITHOUT PRESENCE OF ULCERS (sore throat, sores in mouth, or a toothache) UNUSUAL RASH, SWELLING OR PAIN  UNUSUAL VAGINAL DISCHARGE OR ITCHING   Items with * indicate a potential emergency and should be followed up as soon as possible or go to the Emergency Department if any problems should occur.  Please show the CHEMOTHERAPY ALERT  CARD or IMMUNOTHERAPY ALERT CARD at check-in to the Emergency Department and triage nurse.  Should you have questions after your visit or need to cancel or reschedule your appointment, please contact [facility]  Dept: [phone]  and follow the prompts.  Office hours are 8:00 a.m. to 4:30 p.m. Monday - Friday. Please note that voicemails left after 4:00 p.m. may not be returned until the following business day.  We are closed weekends and major holidays. You have access to a nurse at all times for urgent questions. Please call the main number to the clinic Dept: [phone] and follow the prompts.   For any non-urgent questions, you may also contact your provider using MyChart. We now offer e-Visits for anyone 71 and older to request care online for non-urgent symptoms. For details visit [email or web address].   Also download the MyChart app! Go to the app store, search "MyChart", open the app, select [facility], and log in with your MyChart username and password.  Due to Covid, a mask is required upon entering the hospital/clinic. If you do not have a mask, one will be given to you upon arrival. For doctor visits, patients may have 1 support person aged 29 or older with them. For treatment visits, patients cannot have anyone with them due to current Covid guidelines and our immunocompromised population.   Nanoparticle Albumin-Bound Paclitaxel injection What is this medication? NANOPARTICLE ALBUMIN-BOUND PACLITAXEL (Na no PAHR ti kuhl  al BYOO muhn-bound  PAK li TAX el) is a chemotherapy drug. It targets fast dividing cells, like cancer cells, and causes these cells to die.  This medicine is used to treat advanced breast cancer, lung cancer, and pancreatic cancer. This medicine may be used for other purposes; ask your health care provider or pharmacist if you have questions. COMMON BRAND NAME(S): Abraxane What should I tell my care team before I take this  medication? They need to know if you have any of these conditions: kidney disease liver disease low blood counts, like low white cell, platelet, or red cell counts lung or breathing disease, like asthma tingling of the fingers or toes, or other nerve disorder an unusual or allergic reaction to paclitaxel, albumin, other chemotherapy, other medicines, foods, dyes, or preservatives pregnant or trying to get pregnant breast-feeding How should I use this medication? This drug is given as an infusion into a vein. It is administered in a hospital or clinic by a specially trained health care professional. Talk to your pediatrician regarding the use of this medicine in children. Special care may be needed. Overdosage: If you think you have taken too much of this medicine contact a poison control center or emergency room at once. NOTE: This medicine is only for you. Do not share this medicine with others. What if I miss a dose? It is important not to miss your dose. Call your doctor or health care professional if you are unable to keep an appointment. What may interact with this medication? This medicine may interact with the following medications: antiviral medicines for hepatitis, HIV or AIDS certain antibiotics like erythromycin and clarithromycin certain medicines for fungal infections like ketoconazole and itraconazole certain medicines for seizures like carbamazepine, phenobarbital, phenytoin gemfibrozil nefazodone rifampin St. John's wort This list may not describe all possible interactions. Give your health care provider a list of all the medicines, herbs, non-prescription drugs, or dietary supplements you use. Also tell them if you smoke, drink alcohol, or use illegal drugs. Some items may interact with your medicine. What should I watch for while using this medication? Your condition will be monitored carefully while you are receiving this medicine. You will need important blood work  done while you are taking this medicine. This medicine can cause serious allergic reactions. If you experience allergic reactions like skin rash, itching or hives, swelling of the face, lips, or tongue, tell your doctor or health care professional right away. In some cases, you may be given additional medicines to help with side effects. Follow all directions for their use. This drug may make you feel generally unwell. This is not uncommon, as chemotherapy can affect healthy cells as well as cancer cells. Report any side effects. Continue your course of treatment even though you feel ill unless your doctor tells you to stop. Call your doctor or health care professional for advice if you get a fever, chills or sore throat, or other symptoms of a cold or flu. Do not treat yourself. This drug decreases your body's ability to fight infections. Try to avoid being around people who are sick. This medicine may increase your risk to bruise or bleed. Call your doctor or health care professional if you notice any unusual bleeding. Be careful brushing and flossing your teeth or using a toothpick because you may get an infection or bleed more easily. If you have any dental work done, tell your dentist you are receiving this medicine. Avoid taking products that contain aspirin, acetaminophen, ibuprofen, naproxen, or ketoprofen unless instructed by your doctor. These medicines may hide a fever. Do not become pregnant while taking this medicine or for 6 months after  stopping it. Women should inform their doctor if they wish to become pregnant or think they might be pregnant. Men should not father a child while taking this medicine or for 3 months after stopping it. There is a potential for serious side effects to an unborn child. Talk to your health care professional or pharmacist for more information. Do not breast-feed an infant while taking this medicine or for 2 weeks after stopping it. This medicine may interfere  with the ability to get pregnant or to father a child. You should talk to your doctor or health care professional if you are concerned about your fertility. What side effects may I notice from receiving this medication? Side effects that you should report to your doctor or health care professional as soon as possible: allergic reactions like skin rash, itching or hives, swelling of the face, lips, or tongue breathing problems changes in vision fast, irregular heartbeat low blood pressure mouth sores pain, tingling, numbness in the hands or feet signs of decreased platelets or bleeding - bruising, pinpoint red spots on the skin, black, tarry stools, blood in the urine signs of decreased red blood cells - unusually weak or tired, feeling faint or lightheaded, falls signs of infection - fever or chills, cough, sore throat, pain or difficulty passing urine signs and symptoms of liver injury like dark yellow or brown urine; general ill feeling or flu-like symptoms; light-colored stools; loss of appetite; nausea; right upper belly pain; unusually weak or tired; yellowing of the eyes or skin swelling of the ankles, feet, hands unusually slow heartbeat Side effects that usually do not require medical attention (report to your doctor or health care professional if they continue or are bothersome): diarrhea hair loss loss of appetite nausea, vomiting tiredness This list may not describe all possible side effects. Call your doctor for medical advice about side effects. You may report side effects to FDA at 1-800-FDA-1088. Where should I keep my medication? This drug is given in a hospital or clinic and will not be stored at home. NOTE: This sheet is a summary. It may not cover all possible information. If you have questions about this medicine, talk to your doctor, pharmacist, or health care provider.  2022 Elsevier/Gold Standard (2016-09-16 00:00:00)  Gemcitabine injection What is this  medication? GEMCITABINE (jem SYE ta been) is a chemotherapy drug. This medicine is used to treat many types of cancer like breast cancer, lung cancer, pancreatic cancer, and ovarian cancer. This medicine may be used for other purposes; ask your health care provider or pharmacist if you have questions. COMMON BRAND NAME(S): Gemzar, Infugem What should I tell my care team before I take this medication? They need to know if you have any of these conditions: blood disorders infection kidney disease liver disease lung or breathing disease, like asthma recent or ongoing radiation therapy an unusual or allergic reaction to gemcitabine, other chemotherapy, other medicines, foods, dyes, or preservatives pregnant or trying to get pregnant breast-feeding How should I use this medication? This drug is given as an infusion into a vein. It is administered in a hospital or clinic by a specially trained health care professional. Talk to your pediatrician regarding the use of this medicine in children. Special care may be needed. Overdosage: If you think you have taken too much of this medicine contact a poison control center or emergency room at once. NOTE: This medicine is only for you. Do not share this medicine with others. What if I miss a dose?  It is important not to miss your dose. Call your doctor or health care professional if you are unable to keep an appointment. What may interact with this medication? medicines to increase blood counts like filgrastim, pegfilgrastim, sargramostim some other chemotherapy drugs like cisplatin vaccines Talk to your doctor or health care professional before taking any of these medicines: acetaminophen aspirin ibuprofen ketoprofen naproxen This list may not describe all possible interactions. Give your health care provider a list of all the medicines, herbs, non-prescription drugs, or dietary supplements you use. Also tell them if you smoke, drink alcohol, or  use illegal drugs. Some items may interact with your medicine. What should I watch for while using this medication? Visit your doctor for checks on your progress. This drug may make you feel generally unwell. This is not uncommon, as chemotherapy can affect healthy cells as well as cancer cells. Report any side effects. Continue your course of treatment even though you feel ill unless your doctor tells you to stop. In some cases, you may be given additional medicines to help with side effects. Follow all directions for their use. Call your doctor or health care professional for advice if you get a fever, chills or sore throat, or other symptoms of a cold or flu. Do not treat yourself. This drug decreases your body's ability to fight infections. Try to avoid being around people who are sick. This medicine may increase your risk to bruise or bleed. Call your doctor or health care professional if you notice any unusual bleeding. Be careful brushing and flossing your teeth or using a toothpick because you may get an infection or bleed more easily. If you have any dental work done, tell your dentist you are receiving this medicine. Avoid taking products that contain aspirin, acetaminophen, ibuprofen, naproxen, or ketoprofen unless instructed by your doctor. These medicines may hide a fever. Do not become pregnant while taking this medicine or for 6 months after stopping it. Women should inform their doctor if they wish to become pregnant or think they might be pregnant. Men should not father a child while taking this medicine and for 3 months after stopping it. There is a potential for serious side effects to an unborn child. Talk to your health care professional or pharmacist for more information. Do not breast-feed an infant while taking this medicine or for at least 1 week after stopping it. Men should inform their doctors if they wish to father a child. This medicine may lower sperm counts. Talk with your  doctor or health care professional if you are concerned about your fertility. What side effects may I notice from receiving this medication? Side effects that you should report to your doctor or health care professional as soon as possible: allergic reactions like skin rash, itching or hives, swelling of the face, lips, or tongue breathing problems pain, redness, or irritation at site where injected signs and symptoms of a dangerous change in heartbeat or heart rhythm like chest pain; dizziness; fast or irregular heartbeat; palpitations; feeling faint or lightheaded, falls; breathing problems signs of decreased platelets or bleeding - bruising, pinpoint red spots on the skin, black, tarry stools, blood in the urine signs of decreased red blood cells - unusually weak or tired, feeling faint or lightheaded, falls signs of infection - fever or chills, cough, sore throat, pain or difficulty passing urine signs and symptoms of kidney injury like trouble passing urine or change in the amount of urine signs and symptoms of liver injury  like dark yellow or brown urine; general ill feeling or flu-like symptoms; light-colored stools; loss of appetite; nausea; right upper belly pain; unusually weak or tired; yellowing of the eyes or skin swelling of ankles, feet, hands Side effects that usually do not require medical attention (report to your doctor or health care professional if they continue or are bothersome): constipation diarrhea hair loss loss of appetite nausea rash vomiting This list may not describe all possible side effects. Call your doctor for medical advice about side effects. You may report side effects to FDA at 1-800-FDA-1088. Where should I keep my medication? This drug is given in a hospital or clinic and will not be stored at home. NOTE: This sheet is a summary. It may not cover all possible information. If you have questions about this medicine, talk to your doctor, pharmacist, or  health care provider.  2022 Elsevier/Gold Standard (2017-04-08 00:00:00)

## 2021-01-31 NOTE — Telephone Encounter (Addendum)
Patient seen by Dr. Benay Spice today  Vitals are within treatment parameters.  Labs reviewed by Dr. Benay Spice and are not all within treatment parameters. Platelets 84,000 ok to treat per Dr Benay Spice   Per physician team, patient is ready for treatment and there are NO modifications to the treatment plan.

## 2021-01-31 NOTE — Progress Notes (Signed)
Patient presents for treatment. RN assessment completed along with the following:  Labs/vitals reviewed - Yes, and Plt 84, okay to proceed with treatment per Dr. Benay Spice.    Weight within 10% of previous measurement - Yes Oncology Treatment Attestation completed for current therapy- Yes, on date 10/25/2020 Informed consent completed and reflects current therapy/intent - Yes, on date 11/22/2020             Provider progress note reviewed - Yes, today's provider note was reviewed. Treatment/Antibody/Supportive plan reviewed - Yes, and there are no adjustments needed for today's treatment. S&H and other orders reviewed - Yes, and there are no additional orders identified. Previous treatment date reviewed - Yes, and the appropriate amount of time has elapsed between treatments. Clinic Hand Off Received from - [facility] Nurse Note  Patient to proceed with treatment.

## 2021-01-31 NOTE — Progress Notes (Signed)
[facility] OFFICE PROGRESS NOTE   Diagnosis: Pancreas cancer  INTERVAL HISTORY:   Nathan Mora returns as scheduled.  He completed another treatment with gemcitabine and Abraxane on 01/17/2021.  No fever or rash.  Numbness in the left fingers has improved.  Good appetite.  He reports malaise for a few days following chemotherapy.  Objective:  Vital signs in last 24 hours:  Blood pressure 122/76, pulse 84, temperature 97.8 F (36.6 C), temperature source Oral, resp. rate 18, height _0  (1.727 m), weight 172 lb (78 kg), SpO2 100 %.    HEENT: No thrush or ulcers Resp: Lungs clear bilaterally Cardio: Regular rate and rhythm GI: No hepatosplenomegaly, no mass, nontender Vascular: No leg edema   Portacath/PICC-without erythema  Lab Results:  Lab Results  Component Value Date   WBC 14.7 (H) 01/31/2021   HGB 11.2 (L) 01/31/2021   HCT 34.4 (L) 01/31/2021   MCV 95.0 01/31/2021   PLT 84 (L) 01/31/2021   NEUTROABS 9.5 (H) 01/31/2021    CMP  Lab Results  Component Value Date   NA 137 01/17/2021   K 3.8 01/17/2021   CL 103 01/17/2021   CO2 27 01/17/2021   GLUCOSE 257 (H) 01/17/2021   BUN 14 01/17/2021   CREATININE 0.66 01/17/2021   CALCIUM 8.4 (L) 01/17/2021   PROT 5.7 (L) 01/17/2021   ALBUMIN 3.8 01/17/2021   AST 14 (L) 01/17/2021   ALT 16 01/17/2021   ALKPHOS 154 (H) 01/17/2021   BILITOT 0.5 01/17/2021   GFRNONAA >60 01/17/2021   GFRAA 99 12/21/2019    Lab Results  Component Value Date   IOE703 432 (H) 01/17/2021      Medications: I have reviewed the patient's current medications.   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  Fat planes around the celiac axis preserved.  Less than 90 degree contact of the lateral wall of the SMA, no evidence of metastatic disease, 3 mm left lower lobe pulmonary nodule Cycle 1 FOLFOX 05/09/2020 (plan to add irinotecan with cycle 2 pending tolerance) Cycle 2 FOLFIRINOX 05/23/2020 Cycle 3 FOLFIRINOX 06/06/2020, oxaliplatin dose reduced secondary to thrombocytopenia Cycle 4 FOLFIRINOX 06/27/2020, oxaliplatin further dose reduced secondary to thrombocytopenia CTs at [facility] 07/03/2020-redemonstration of a heterogeneous pancreatic head mass with borderline encasement of the SMA as well as abutment of the portal vein, SMV and likely the IVC.  Similar mildly enlarged porta hepatis and pericaval lymph nodes.  No convincing evidence of distant metastatic disease. 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  More than 180 degree involvement of  the SMV and SMA Cycle 1 gemcitabine/Abraxane 11/22/2020, [facility] counselor contacted him 11/26/2020-no pathogenic variants identified in the 84 genes analyzed.  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 Cycle 2  gemcitabine/Abraxane 12/06/2020, Udenyca Cycle 3 gemcitabine/Abraxane 12/19/2020, Udenyca Cycle 4 gemcitabine/Abraxane 01/03/2021, Udenyca Cycle 5 gemcitabine/Abraxane 01/17/2021, Udenyca Cycle 6 gemcitabine/Abraxane 01/31/2021, Udenyca Obstructive jaundice secondary #1 ERCP 04/11/2020-prominent and floppy major papilla, dilated bile duct secondary to a distal stricture, placement of an uncovered metal stent, bile duct brushings-reactive glandular cells 3.   Diabetes   4.   Hyperlipidemia   5.   Glaucoma   6.   Port-A-Cath placement 05/04/2020 Interventional Radiology   7.   Mild thrombocytopenia (121,000) 05/09/2020      Disposition: Nathan. Mora appears stable.  He is tolerating the gemcitabine/Abraxane well.  The CA 19-9 was lower last month.  He will complete another cycle today.  He is scheduled for a restaging evaluation in follow-up at [facility] on 02/22/2021.  He will return for an office visit here on 02/26/2021.  No Nathan Mora plans a trip to [place] in 2 weeks.  Today will be the last chemotherapy cycle prior to the restaging evaluation.  [name], MD  01/31/2021  10:10 AM

## 2021-02-01 ENCOUNTER — Inpatient Hospital Stay: Payer: Medicare HMO

## 2021-02-01 VITALS — BP 127/69 | HR 71 | Temp 98.3°F | Resp 19

## 2021-02-01 DIAGNOSIS — K831 Obstruction of bile duct: Secondary | ICD-10-CM | POA: Diagnosis not present

## 2021-02-01 DIAGNOSIS — C250 Malignant neoplasm of head of pancreas: Secondary | ICD-10-CM | POA: Diagnosis not present

## 2021-02-01 DIAGNOSIS — Z5111 Encounter for antineoplastic chemotherapy: Secondary | ICD-10-CM | POA: Diagnosis not present

## 2021-02-01 DIAGNOSIS — Z79899 Other long term (current) drug therapy: Secondary | ICD-10-CM | POA: Diagnosis not present

## 2021-02-01 DIAGNOSIS — E119 Type 2 diabetes mellitus without complications: Secondary | ICD-10-CM | POA: Diagnosis not present

## 2021-02-01 DIAGNOSIS — E785 Hyperlipidemia, unspecified: Secondary | ICD-10-CM | POA: Diagnosis not present

## 2021-02-01 LAB — CANCER ANTIGEN 19-9: CA 19-9: 671 U/mL — ABNORMAL HIGH (ref 0–35)

## 2021-02-01 MED ORDER — PEGFILGRASTIM-CBQV 6 MG/0.6ML ~~LOC~~ SOSY
6.0000 mg | PREFILLED_SYRINGE | Freq: Once | SUBCUTANEOUS | Status: AC
  Administered 2021-02-01: 6 mg via SUBCUTANEOUS

## 2021-02-01 MED ORDER — SODIUM CHLORIDE 0.9 % IV SOLN
10.0000 mg | Freq: Once | INTRAVENOUS | Status: DC
  Filled 2021-02-01: qty 1

## 2021-02-01 NOTE — Patient Instructions (Signed)
[facility]  Discharge Instructions: Thank you for choosing [facility] to provide your oncology and hematology care.   If you have a lab appointment with the Cancer Center, please go directly to the Cancer Center and check in at the registration area.   Wear comfortable clothing and clothing appropriate for easy access to any Portacath or PICC line.   We strive to give you quality time with your provider. You may need to reschedule your appointment if you arrive late (15 or more minutes).  Arriving late affects you and other patients whose appointments are after yours.  Also, if you miss three or more appointments without notifying the office, you may be dismissed from the clinic at the provider's discretion.      For prescription refill requests, have your pharmacy contact our office and allow 72 hours for refills to be completed.    Today you received the following Udenyca    To help prevent nausea and vomiting after your treatment, we encourage you to take your nausea medication as directed.  BELOW ARE SYMPTOMS THAT SHOULD BE REPORTED IMMEDIATELY: *FEVER GREATER THAN 100.4 F (38 C) OR HIGHER *CHILLS OR SWEATING *NAUSEA AND VOMITING THAT IS NOT CONTROLLED WITH YOUR NAUSEA MEDICATION *UNUSUAL SHORTNESS OF BREATH *UNUSUAL BRUISING OR BLEEDING *URINARY PROBLEMS (pain or burning when urinating, or frequent urination) *BOWEL PROBLEMS (unusual diarrhea, constipation, pain near the anus) TENDERNESS IN MOUTH AND THROAT WITH OR WITHOUT PRESENCE OF ULCERS (sore throat, sores in mouth, or a toothache) UNUSUAL RASH, SWELLING OR PAIN  UNUSUAL VAGINAL DISCHARGE OR ITCHING   Items with * indicate a potential emergency and should be followed up as soon as possible or go to the Emergency Department if any problems should occur.  Please show the CHEMOTHERAPY ALERT CARD or IMMUNOTHERAPY ALERT CARD at check-in to the Emergency Department and triage  nurse.  Should you have questions after your visit or need to cancel or reschedule your appointment, please contact [facility]  Dept: [phone]  and follow the prompts.  Office hours are 8:00 a.m. to 4:30 p.m. Monday - Friday. Please note that voicemails left after 4:00 p.m. may not be returned until the following business day.  We are closed weekends and major holidays. You have access to a nurse at all times for urgent questions. Please call the main number to the clinic Dept: [phone] and follow the prompts.   For any non-urgent questions, you may also contact your provider using MyChart. We now offer e-Visits for anyone 18 and older to request care online for non-urgent symptoms. For details visit [email or web address].   Also download the MyChart app! Go to the app store, search "MyChart", open the app, select , and log in with your MyChart username and password.  Due to Covid, a mask is required upon entering the hospital/clinic. If you do not have a mask, one will be given to you upon arrival. For doctor visits, patients may have 1 support person aged 18 or older with them. For treatment visits, patients cannot have anyone with them due to current Covid guidelines and our immunocompromised population.   Pegfilgrastim Injection What is this medication? PEGFILGRASTIM (PEG fil gra stim) lowers the risk of infection in people who are receiving chemotherapy. It works by helping your body make more white blood cells, which protects your body from infection. It may also be used to help people who have been exposed to high doses of   radiation. This medicine may be used for other purposes; ask your health care provider or pharmacist if you have questions. COMMON BRAND NAME(S): Fulphila, Neulasta, Nyvepria, UDENYCA, Ziextenzo What should I tell my care team before I take this medication? They need to know if you have any of these conditions: Kidney  disease Latex allergy Ongoing radiation therapy Sickle cell disease Skin reactions to acrylic adhesives (On-Body Injector only) An unusual or allergic reaction to pegfilgrastim, filgrastim, other medications, foods, dyes, or preservatives Pregnant or trying to get pregnant Breast-feeding How should I use this medication? This medication is for injection under the skin. If you get this medication at home, you will be taught how to prepare and give the pre-filled syringe or how to use the On-body Injector. Refer to the patient Instructions for Use for detailed instructions. Use exactly as directed. Tell your care team immediately if you suspect that the On-body Injector may not have performed as intended or if you suspect the use of the On-body Injector resulted in a missed or partial dose. It is important that you put your used needles and syringes in a special sharps container. Do not put them in a trash can. If you do not have a sharps container, call your pharmacist or care team to get one. Talk to your care team about the use of this medication in children. While this medication may be prescribed for selected conditions, precautions do apply. Overdosage: If you think you have taken too much of this medicine contact a poison control center or emergency room at once. NOTE: This medicine is only for you. Do not share this medicine with others. What if I miss a dose? It is important not to miss your dose. Call your care team if you miss your dose. If you miss a dose due to an On-body Injector failure or leakage, a new dose should be administered as soon as possible using a single prefilled syringe for manual use. What may interact with this medication? Interactions have not been studied. This list may not describe all possible interactions. Give your health care provider a list of all the medicines, herbs, non-prescription drugs, or dietary supplements you use. Also tell them if you smoke, drink  alcohol, or use illegal drugs. Some items may interact with your medicine. What should I watch for while using this medication? Your condition will be monitored carefully while you are receiving this medication. You may need blood work done while you are taking this medication. Talk to your care team about your risk of cancer. You may be more at risk for certain types of cancer if you take this medication. If you are going to need a MRI, CT scan, or other procedure, tell your care team that you are using this medication (On-Body Injector only). What side effects may I notice from receiving this medication? Side effects that you should report to your care team as soon as possible: Allergic reactions--skin rash, itching, hives, swelling of the face, lips, tongue, or throat Capillary leak syndrome--stomach or muscle pain, unusual weakness or fatigue, feeling faint or lightheaded, decrease in the amount of urine, swelling of the ankles, hands, or feet, trouble breathing High white blood cell level--fever, fatigue, trouble breathing, night sweats, change in vision, weight loss Inflammation of the aorta--fever, fatigue, back, chest, or stomach pain, severe headache Kidney injury (glomerulonephritis)--decrease in the amount of urine, red or dark brown urine, foamy or bubbly urine, swelling of the ankles, hands, or feet Shortness of breath or   trouble breathing Spleen injury--pain in upper left stomach or shoulder Unusual bruising or bleeding Side effects that usually do not require medical attention (report to your care team if they continue or are bothersome): Bone pain Pain in the hands or feet This list may not describe all possible side effects. Call your doctor for medical advice about side effects. You may report side effects to FDA at 1-800-FDA-1088. Where should I keep my medication? Keep out of the reach of children. If you are using this medication at home, you will be instructed on how to  store it. Throw away any unused medication after the expiration date on the label. NOTE: This sheet is a summary. It may not cover all possible information. If you have questions about this medicine, talk to your doctor, pharmacist, or health care provider.  2022 [employer] Standard (2020-10-02 00:00:00)  

## 2021-02-01 NOTE — Addendum Note (Signed)
Addended by: Henreitta Leber E on: 02/01/2021 02:40 PM   Modules accepted: Orders

## 2021-02-01 NOTE — Addendum Note (Signed)
Addended by: Henreitta Leber E on: 02/01/2021 02:44 PM   Modules accepted: Orders

## 2021-02-07 ENCOUNTER — Other Ambulatory Visit: Payer: Self-pay | Admitting: Oncology

## 2021-02-07 DIAGNOSIS — Z0100 Encounter for examination of eyes and vision without abnormal findings: Secondary | ICD-10-CM | POA: Diagnosis not present

## 2021-02-07 DIAGNOSIS — C259 Malignant neoplasm of pancreas, unspecified: Secondary | ICD-10-CM

## 2021-02-07 DIAGNOSIS — H524 Presbyopia: Secondary | ICD-10-CM | POA: Diagnosis not present

## 2021-02-12 ENCOUNTER — Encounter: Payer: Self-pay | Admitting: Nurse Practitioner

## 2021-02-21 ENCOUNTER — Ambulatory Visit (INDEPENDENT_AMBULATORY_CARE_PROVIDER_SITE_OTHER): Payer: Medicare HMO

## 2021-02-21 DIAGNOSIS — Z0000 Encounter for general adult medical examination without abnormal findings: Secondary | ICD-10-CM

## 2021-02-21 NOTE — Progress Notes (Signed)
Subjective:   Nathan Mora is a 76 y.o. male who presents for an Subsequent l Medicare Annual Wellness Visit.   I connected with Maryan Rued today by telephone and verified that I am speaking with the correct person using two identifiers. Location patient: home Location provider: work Persons participating in the virtual visit: patient, provider.   I discussed the limitations, risks, security and privacy concerns of performing an evaluation and management service by telephone and the availability of in person appointments. I also discussed with the patient that there may be a patient responsible charge related to this service. The patient expressed understanding and verbally consented to this telephonic visit.    Interactive audio and video telecommunications were attempted between this provider and patient, however failed, due to patient having technical difficulties OR patient did not have access to video capability.  We continued and completed visit with audio only.    Review of Systems     Cardiac Risk Factors include: advanced age (>73mn, >>38women);diabetes mellitus;male gender     Objective:    Today's Vitals   There is no height or weight on file to calculate BMI.  Advanced Directives 02/21/2021 02/01/2021 01/31/2021 01/18/2021 01/17/2021 01/04/2021 01/03/2021  Does Patient Have a Medical Advance Directive? Yes Yes Yes Yes Yes Yes Yes  Type of AParamedicof [place]ong[place]Living will [place]Living will [place]Living will - Living will;Healthcare Power of A[place] of facility DNR (pink MOST or yellow form) H[place]leyLiving will [place]Living will  Does patient want to make changes to medical advance directive? - - No - Patient declined No - Patient declined No - Patient declined No - Patient declined No - Patient declined  Copy of [place]in Chart? No -  copy requested - - Yes - validated most recent copy scanned in chart (See row information) Yes - validated most recent copy scanned in chart (See row information) - -  Would patient like information on creating a medical advance directive? - - - - - - -    Current Medications (verified) Outpatient Encounter Medications as of 02/21/2021  Medication Sig   acetaminophen (TYLENOL) 500 MG tablet Take 1,000 mg by mouth every 8 (eight) hours as needed for mild pain.   atorvastatin (LIPITOR) 40 MG tablet TAKE 1/2 TABLET BY MOUTH DAILY AT 6 IN THE EVENING   BD PEN NEEDLE NANO 2ND GEN 32G X 4 MM MISC    blood glucose meter kit and supplies Test once per day - fasting or 2 hours after meal. Dispense based on patient and insurance preference.   brimonidine-timolol (COMBIGAN) 0.2-0.5 % ophthalmic solution Place 1 drop into the left eye every 12 (twelve) hours.   Continuous Blood Gluc Sensor (FREESTYLE LIBRE 2 SENSOR) MISC Inject 1 sensor to the skin every 14 days for continuous glucose monitoring.   COVID-19 mRNA bivalent vaccine, Pfizer, (PFIZER COVID-19 VAC BIVALENT) injection Inject into the muscle.   CREON 36000-114000 units CPEP capsule TAKE 1 CAPSULE BY MOUTH WITH MEALS AND WITH SNACKS AS DIRECTED BY YOUR DOCTOR   doxazosin (CARDURA) 2 MG tablet TAKE ONE TABLET BY MOUTH DAILY   FLUZONE HIGH-DOSE QUADRIVALENT 0.7 ML SUSY    gabapentin (NEURONTIN) 800 MG tablet Take 400 mg by mouth at bedtime as needed (pain).   ibuprofen (ADVIL) 200 MG tablet Take 400 mg by mouth every evening.   insulin aspart (NOVOLOG) 100 UNIT/ML FlexPen Inject 0-6  Units into the skin 3 (three) times daily with meals. Dose per sliding scale, 150-200= 2 units, 201-250= 4 units, 251-300= 6 units   Insulin Glargine (BASAGLAR KWIKPEN) 100 UNIT/ML Inject 9 Units into the skin at bedtime.   Lancets (ONETOUCH DELICA PLUS [id number]) MISC TEST ONCE A DAY - FASTING OR 2 HOURS AFTER MEAL   latanoprost (XALATAN) 0.005 % ophthalmic solution Place  1 drop into the left eye at bedtime.   latanoprost (XALATAN) 0.005 % ophthalmic solution Apply to eye.   lidocaine-prilocaine (EMLA) cream Apply 1 application topically as needed.   LORazepam (ATIVAN) 1 MG tablet Take 0.5-2 tablets (0.5-2 mg total) by mouth at bedtime as needed for anxiety. TAKE ONE TABLET BY MOUTH EVERY 8 HOURS AS NEEDED FOR ANXIETY OR NAUSEA   metFORMIN (GLUCOPHAGE) 500 MG tablet Take 500 mg by mouth daily.   ondansetron (ZOFRAN) 8 MG tablet Take 1 tablet (8 mg total) by mouth every 8 (eight) hours as needed for nausea or vomiting. Do not begin until 72 hours after day 1 chemotherapy   ONETOUCH ULTRA test strip TEST EVERY DAY FASTING OR 2 HOURS AFTER A MEAL   prochlorperazine (COMPAZINE) 10 MG tablet Take 1 tablet (10 mg total) by mouth every 6 (six) hours as needed for nausea or vomiting.   sildenafil (REVATIO) 20 MG tablet 1 to 3 tabs up to QD prior to onset of sexual activity. (Patient taking differently: Take 20-60 mg by mouth daily as needed (erectile dysfunction).)   terbinafine (LAMISIL) 250 MG tablet    omeprazole (PRILOSEC) 20 MG capsule Take 2 capsules (40 mg total) by mouth daily. (Patient not taking: Reported on 02/21/2021)   No facility-administered encounter medications on file as of 02/21/2021.    Allergies (verified) Patient has no known allergies.   History: Past Medical History:  Diagnosis Date   Cancer ([place])    Diabetes mellitus without complication ([place])    Glaucoma    left eye   Hyperlipidemia    Lipid disorder 03/29/2012   Past Surgical History:  Procedure Laterality Date   BILIARY BRUSHING  04/11/2020   Procedure: BILIARY BRUSHING;  Surgeon: Irving Copas., MD;  Location: [facility];  Service: Gastroenterology;;   BILIARY DILATION  10/23/2020   Procedure: BILIARY DILATION;  Surgeon: Irving Copas., MD;  Location: [facility];  Service: Gastroenterology;;   BILIARY STENT PLACEMENT N/A 04/11/2020   Procedure: BILIARY STENT  PLACEMENT;  Surgeon: Irving Copas., MD;  Location: [facility];  Service: Gastroenterology;  Laterality: N/A;   BILIARY STENT PLACEMENT N/A 10/23/2020   Procedure: BILIARY STENT PLACEMENT;  Surgeon: Rush Landmark Telford Nab., MD;  Location: [facility];  Service: Gastroenterology;  Laterality: N/A;   BIOPSY  04/11/2020   Procedure: BIOPSY;  Surgeon: Rush Landmark Telford Nab., MD;  Location: [facility];  Service: Gastroenterology;;   ENDOSCOPIC RETROGRADE CHOLANGIOPANCREATOGRAPHY (ERCP) WITH PROPOFOL N/A 04/11/2020   Procedure: ENDOSCOPIC RETROGRADE CHOLANGIOPANCREATOGRAPHY (ERCP) WITH PROPOFOL;  Surgeon: Irving Copas., MD;  Location: [facility];  Service: Gastroenterology;  Laterality: N/A;   ENDOSCOPIC RETROGRADE CHOLANGIOPANCREATOGRAPHY (ERCP) WITH PROPOFOL N/A 10/23/2020   Procedure: ENDOSCOPIC RETROGRADE CHOLANGIOPANCREATOGRAPHY (ERCP) WITH PROPOFOL;  Surgeon: Rush Landmark Telford Nab., MD;  Location: [facility];  Service: Gastroenterology;  Laterality: N/A;   ESOPHAGOGASTRODUODENOSCOPY (EGD) WITH PROPOFOL N/A 04/11/2020   Procedure: ESOPHAGOGASTRODUODENOSCOPY (EGD) WITH PROPOFOL;  Surgeon: Rush Landmark Telford Nab., MD;  Location: [facility];  Service: Gastroenterology;  Laterality: N/A;   EUS N/A 04/11/2020   Procedure: UPPER ENDOSCOPIC ULTRASOUND (EUS) RADIAL;  Surgeon: Rush Landmark,  Telford Nab., MD;  Location: [facility];  Service: Gastroenterology;  Laterality: N/A;   FINE NEEDLE ASPIRATION  04/11/2020   Procedure: FINE NEEDLE ASPIRATION (FNA) LINEAR;  Surgeon: Irving Copas., MD;  Location: [facility];  Service: Gastroenterology;;   IR IMAGING GUIDED PORT INSERTION  05/04/2020   PANCREATIC STENT PLACEMENT  04/11/2020   Procedure: PANCREATIC STENT PLACEMENT;  Surgeon: Irving Copas., MD;  Location: [facility];  Service: Gastroenterology;;   REMOVAL OF STONES  10/23/2020   Procedure: REMOVAL OF STONES;  Surgeon: Irving Copas., MD;  Location: [facility];  Service: Gastroenterology;;   [name]  04/11/2020   Procedure: [name];  Surgeon: Mansouraty, Telford Nab., MD;  Location: [facility];  Service: Gastroenterology;;   Family History  Problem Relation Age of Onset   Lung cancer Father    Heart attack Brother    Hypertension Brother    Kidney cancer Brother 47   Hypertension Brother    Social History   Socioeconomic History   Marital status: Married    Spouse name: Not on file   Number of children: Not on file   Years of education: Not on file   Highest education level: Not on file  Occupational History   Not on file  Tobacco Use   Smoking status: Never   Smokeless tobacco: Never  Substance and Sexual Activity   Alcohol use: Yes    Alcohol/week: 4.0 standard drinks    Types: 4 Cans of beer per week   Drug use: No   Sexual activity: Yes  Other Topics Concern   Not on file  Social History Narrative   Not on file   Social Determinants of Health   Financial Resource Strain: Low Risk    Difficulty of Paying Living Expenses: Not hard at all  Food Insecurity: No Food Insecurity   Worried About Charity fundraiser in the Last Year: Never true   [place] in the Last Year: Never true  Transportation Needs: No Transportation Needs   Lack of Transportation (Medical): No   Lack of Transportation (Non-Medical): No  Physical Activity: Sufficiently Active   Days of Exercise per Week: 3 days   Minutes of Exercise per Session: 50 min  Stress: No Stress Concern Present   Feeling of Stress : Not at all  Social Connections: Socially Integrated   Frequency of Communication with Friends and Family: Three times a week   Frequency of Social Gatherings with Friends and Family: Three times a week   Attends Religious Services: More than 4 times per year   Active Member of Clubs or Organizations: Yes   Attends Music therapist: More than 4 times per year   Marital Status: Married    Tobacco  Counseling Counseling given: Not Answered   Clinical Intake:  Pre-visit preparation completed: Yes  Pain : No/denies pain     Nutritional Risks: None Diabetes: Yes CBG done?: No Did pt. bring in CBG monitor from home?: No  How often do you need to have someone help you when you read instructions, pamphlets, or other written materials from your doctor or pharmacy?: 1 - Never What is the last grade level you completed in school?: BS  Diabetic?yes  Nutrition Risk Assessment:  Has the patient had any N/V/D within the last 2 months?  Yes  Does the patient have any non-healing wounds?  No  Has the patient had any unintentional weight loss or weight gain?  No   Diabetes:  Is the patient diabetic?  Yes  If diabetic, was a CBG obtained today?  No  Did the patient bring in their glucometer from home?  No  How often do you monitor your CBG's? 3 x day .   Financial Strains and Diabetes Management:  Are you having any financial strains with the device, your supplies or your medication? No .  Does the patient want to be seen by Chronic Care Management for management of their diabetes?  No  Would the patient like to be referred to a Nutritionist or for Diabetic Management?  No   Diabetic Exams:  Diabetic Eye Exam: Completed 01/2021 Diabetic Foot Exam: Overdue, Pt has been advised about the importance in completing this exam. Pt is scheduled for diabetic foot exam on next office visit .   Interpreter Needed?: No  Information entered by :: [place] of Daily Living In your present state of health, do you have any difficulty performing the following activities: 02/21/2021  Hearing? N  Vision? N  Difficulty concentrating or making decisions? N  Walking or climbing stairs? N  Dressing or bathing? N  Doing errands, shopping? N  Preparing Food and eating ? N  Using the Toilet? N  In the past six months, have you accidently leaked urine? N  Do you have problems  with loss of bowel control? N  Managing your Medications? N  Managing your Finances? N  Housekeeping or managing your Housekeeping? N  Some recent data might be hidden    Patient Care Team: [name], MD as PCP - General (Family Medicine) [name], MD as Consulting Physician (Oncology)  Indicate any recent Medical Services you may have received from other than [facility] providers in the past year (date may be approximate).     Assessment:   This is a routine wellness examination for [facility].  Hearing/Vision screen Vision Screening - Comments:: Annual eye exams wear glasses/contacts   Dietary issues and exercise activities discussed: Current Exercise Habits: Home exercise routine, Type of exercise: walking;strength training/weights, Time (Minutes): 50, Frequency (Times/Week): 3, Weekly Exercise (Minutes/Week): 150, Intensity: Mild, Exercise limited by: Other - see comments (Pancreatic Cancer)   Goals Addressed   None    Depression Screen PHQ 2/9 Scores 02/21/2021 02/21/2021 10/29/2020 10/17/2020 03/23/2020 12/21/2019 09/15/2019  PHQ - 2 Score 0 0 0 1 0 0 0  PHQ- 9 Score - - 1 11 - - -    Fall Risk Fall Risk  02/21/2021 10/17/2020 03/23/2020 12/21/2019 09/15/2019  Falls in the past year? 0 0 0 0 0  Number falls in past yr: 0 0 0 - -  Injury with Fall? 0 0 0 - -  Risk for fall due to : No Fall Risks No Fall Risks - - -  Follow up Falls evaluation completed Falls evaluation completed Falls evaluation completed Falls evaluation completed Falls evaluation completed    [place]:  Any stairs in or around the home? Yes  If so, are there any without handrails? No  Home free of loose throw rugs in walkways, pet beds, electrical cords, etc? Yes  Adequate lighting in your home to reduce risk of falls? Yes   ASSISTIVE DEVICES UTILIZED TO PREVENT FALLS:  Life alert? No  Use of a cane, walker or w/c? No  Grab bars in the bathroom? Yes  Shower  chair or bench in shower? Yes  Elevated toilet seat or a handicapped toilet? Yes  Cognitive Function:Normal cognitive status assessed by direct observation by this Nurse Health Advisor. No abnormalities found.       6CIT Screen 01/10/2019 12/04/2017 11/13/2016  What Year? 0 points 0 points 0 points  What month? 0 points 0 points 0 points  What time? 0 points 0 points 0 points  Count back from 20 0 points 2 points 0 points  Months in reverse 0 points 0 points 0 points  Repeat phrase 0 points 0 points 0 points  Total Score 0 2 0    Immunizations Immunization History  Administered Date(s) Administered   Influenza, High Dose Seasonal PF 09/13/2018   Influenza, Quadrivalent, Recombinant, Inj, Pf 11/06/2017   Influenza,inj,Quad PF,6+ Mos 10/11/2013, 09/27/2015   Influenza-Unspecified 11/06/2017, 10/28/2019   PFIZER Comirnaty(Gray Top)Covid-19 Tri-Sucrose Vaccine 04/26/2020   PFIZER(Purple Top)SARS-COV-2 Vaccination 03/05/2019, 03/26/2019   Pfizer Covid-19 Vaccine Bivalent Booster 74yr & up 12/06/2020   Pneumococcal Conjugate-13 10/11/2013   Pneumococcal Polysaccharide-23 12/12/2006, 09/27/2015, 05/20/2018   Td 01/11/2008   Zoster, Live 02/11/2008    TDAP status: Due, Education has been provided regarding the importance of this vaccine. Advised may receive this vaccine at local pharmacy or Health Dept. Aware to provide a copy of the vaccination record if obtained from local pharmacy or Health Dept. Verbalized acceptance and understanding.  Flu Vaccine status: Up to date  Pneumococcal vaccine status: Up to date  Covid-19 vaccine status: Completed vaccines  Qualifies for Shingles Vaccine? Yes   Zostavax completed No   Shingrix Completed?: No.    Education has been provided regarding the importance of this vaccine. Patient has been advised to call insurance company to determine out of pocket expense if they have not yet received this vaccine. Advised may also receive vaccine at  local pharmacy or Health Dept. Verbalized acceptance and understanding.  Screening Tests Health Maintenance  Topic Date Due   Zoster Vaccines- Shingrix (1 of 2) Never done   TETANUS/TDAP  01/10/2018   INFLUENZA VACCINE  08/27/2020   COLONOSCOPY (Pts 45-461yrInsurance coverage will need to be confirmed)  12/31/2025   Pneumonia Vaccine 6568Years old  Completed   COVID-19 Vaccine  Completed   Hepatitis C Screening  Completed   HPV VACCINES  Aged Out    Health Maintenance  Health Maintenance Due  Topic Date Due   Zoster Vaccines- Shingrix (1 of 2) Never done   TETANUS/TDAP  01/10/2018   INFLUENZA VACCINE  08/27/2020    Colorectal cancer screening: Type of screening: Colonoscopy. Completed 01/01/2016. Repeat every 10 years  Lung Cancer Screening: (Low Dose CT Chest recommended if Age 75-80ears, 30 pack-year currently smoking OR have quit w/in 15years.) does not qualify.   Lung Cancer Screening Referral: n/a  Additional Screening:  Hepatitis C Screening: does not qualify; Completed 03/30/2012  Vision Screening: Recommended annual ophthalmology exams for early detection of glaucoma and other disorders of the eye. Is the patient up to date with their annual eye exam?  Yes  Who is the provider or what is the name of the office in which the patient attends annual eye exams? Dr. PrCheri GuppyIf pt is not established with a provider, would they like to be referred to a provider to establish care? No .   Dental Screening: Recommended annual dental exams for proper oral hygiene  Community Resource Referral / Chronic Care Management: CRR required this visit?  No   CCM required this visit?  No      Plan:     I have  personally reviewed and noted the following in the patients chart:   Medical and social history Use of alcohol, tobacco or illicit drugs  Current medications and supplements including opioid prescriptions. Patient is not currently taking opioid  prescriptions. Functional ability and status Nutritional status Physical activity Advanced directives List of other physicians Hospitalizations, surgeries, and ER visits in previous 12 months Vitals Screenings to include cognitive, depression, and falls Referrals and appointments  In addition, I have reviewed and discussed with patient certain preventive protocols, quality metrics, and best practice recommendations. A written personalized care plan for preventive services as well as general preventive health recommendations were provided to patient.     Randel Pigg, LPN   [id number]/[id number]   Nurse Notes: none

## 2021-02-21 NOTE — Patient Instructions (Signed)
Nathan Mora , Thank you for taking time to come for your Medicare Wellness Visit. I appreciate your ongoing commitment to your health goals. Please review the following plan we discussed and let me know if I can assist you in the future.   Screening recommendations/referrals: Colonoscopy: 01/01/2016 Recommended yearly ophthalmology/optometry visit for glaucoma screening and checkup Recommended yearly dental visit for hygiene and checkup  Vaccinations: Influenza vaccine: completed  Pneumococcal vaccine: completed  Tdap vaccine: due  Shingles vaccine: will get from [place] directives: yes  Conditions/risks identified: none   Next appointment: none   Preventive Care 65 Years and Older, Male Preventive care refers to lifestyle choices and visits with your health care provider that can promote health and wellness. What does preventive care include? A yearly physical exam. This is also called an annual well check. Dental exams once or twice a year. Routine eye exams. Ask your health care provider how often you should have your eyes checked. Personal lifestyle choices, including: Daily care of your teeth and gums. Regular physical activity. Eating a healthy diet. Avoiding tobacco and drug use. Limiting alcohol use. Practicing safe sex. Taking low doses of aspirin every day. Taking vitamin and mineral supplements as recommended by your health care provider. What happens during an annual well check? The services and screenings done by your health care provider during your annual well check will depend on your age, overall health, lifestyle risk factors, and family history of disease. Counseling  Your health care provider may ask you questions about your: Alcohol use. Tobacco use. Drug use. Emotional well-being. Home and relationship well-being. Sexual activity. Eating habits. History of falls. Memory and ability to understand (cognition). Work and work  Statistician. Screening  You may have the following tests or measurements: Height, weight, and BMI. Blood pressure. Lipid and cholesterol levels. These may be checked every 5 years, or more frequently if you are over 20 years old. Skin check. Lung cancer screening. You may have this screening every year starting at age 72 if you have a 30-pack-year history of smoking and currently smoke or have quit within the past 15 years. Fecal occult blood test (FOBT) of the stool. You may have this test every year starting at age 40. Flexible sigmoidoscopy or colonoscopy. You may have a sigmoidoscopy every 5 years or a colonoscopy every 10 years starting at age 54. Prostate cancer screening. Recommendations will vary depending on your family history and other risks. Hepatitis C blood test. Hepatitis B blood test. Sexually transmitted disease (STD) testing. Diabetes screening. This is done by checking your blood sugar (glucose) after you have not eaten for a while (fasting). You may have this done every 1-3 years. Abdominal aortic aneurysm (AAA) screening. You may need this if you are a current or former smoker. Osteoporosis. You may be screened starting at age 18 if you are at high risk. Talk with your health care provider about your test results, treatment options, and if necessary, the need for more tests. Vaccines  Your health care provider may recommend certain vaccines, such as: Influenza vaccine. This is recommended every year. Tetanus, diphtheria, and acellular pertussis (Tdap, Td) vaccine. You may need a Td booster every 10 years. Zoster vaccine. You may need this after age 69. Pneumococcal 13-valent conjugate (PCV13) vaccine. One dose is recommended after age 40. Pneumococcal polysaccharide (PPSV23) vaccine. One dose is recommended after age 47. Talk to your health care provider about which screenings and vaccines you need and  how often you need them. This information is not intended to replace  advice given to you by your health care provider. Make sure you discuss any questions you have with your health care provider. Document Released: 02/09/2015 Document Revised: 10/03/2015 Document Reviewed: 11/14/2014 Elsevier Interactive Patient Education  2017 [place] Prevention in the Home Falls can cause injuries. They can happen to people of all ages. There are many things you can do to make your home safe and to help prevent falls. What can I do on the outside of my home? Regularly fix the edges of walkways and driveways and fix any cracks. Remove anything that might make you trip as you walk through a door, such as a raised step or threshold. Trim any bushes or trees on the path to your home. Use bright outdoor lighting. Clear any walking paths of anything that might make someone trip, such as rocks or tools. Regularly check to see if handrails are loose or broken. Make sure that both sides of any steps have handrails. Any raised decks and porches should have guardrails on the edges. Have any leaves, snow, or ice cleared regularly. Use sand or salt on walking paths during winter. Clean up any spills in your garage right away. This includes oil or grease spills. What can I do in the bathroom? Use night lights. Install grab bars by the toilet and in the tub and shower. Do not use towel bars as grab bars. Use non-skid mats or decals in the tub or shower. If you need to sit down in the shower, use a plastic, non-slip stool. Keep the floor dry. Clean up any water that spills on the floor as soon as it happens. Remove soap buildup in the tub or shower regularly. Attach bath mats securely with double-sided non-slip rug tape. Do not have throw rugs and other things on the floor that can make you trip. What can I do in the bedroom? Use night lights. Make sure that you have a light by your bed that is easy to reach. Do not use any sheets or blankets that are too big for your bed.  They should not hang down onto the floor. Have a firm chair that has side arms. You can use this for support while you get dressed. Do not have throw rugs and other things on the floor that can make you trip. What can I do in the kitchen? Clean up any spills right away. Avoid walking on wet floors. Keep items that you use a lot in easy-to-reach places. If you need to reach something above you, use a strong step stool that has a grab bar. Keep electrical cords out of the way. Do not use floor polish or wax that makes floors slippery. If you must use wax, use non-skid floor wax. Do not have throw rugs and other things on the floor that can make you trip. What can I do with my stairs? Do not leave any items on the stairs. Make sure that there are handrails on both sides of the stairs and use them. Fix handrails that are broken or loose. Make sure that handrails are as long as the stairways. Check any carpeting to make sure that it is firmly attached to the stairs. Fix any carpet that is loose or worn. Avoid having throw rugs at the top or bottom of the stairs. If you do have throw rugs, attach them to the floor with carpet tape. Make sure that you have  a light switch at the top of the stairs and the bottom of the stairs. If you do not have them, ask someone to add them for you. What else can I do to help prevent falls? Wear shoes that: Do not have high heels. Have rubber bottoms. Are comfortable and fit you well. Are closed at the toe. Do not wear sandals. If you use a stepladder: Make sure that it is fully opened. Do not climb a closed stepladder. Make sure that both sides of the stepladder are locked into place. Ask someone to hold it for you, if possible. Clearly mark and make sure that you can see: Any grab bars or handrails. First and last steps. Where the edge of each step is. Use tools that help you move around (mobility aids) if they are needed. These  include: Canes. Walkers. Scooters. Crutches. Turn on the lights when you go into a dark area. Replace any light bulbs as soon as they burn out. Set up your furniture so you have a clear path. Avoid moving your furniture around. If any of your floors are uneven, fix them. If there are any pets around you, be aware of where they are. Review your medicines with your doctor. Some medicines can make you feel dizzy. This can increase your chance of falling. Ask your doctor what other things that you can do to help prevent falls. This information is not intended to replace advice given to you by your health care provider. Make sure you discuss any questions you have with your health care provider. Document Released: 11/09/2008 Document Revised: 06/21/2015 Document Reviewed: 02/17/2014 Elsevier Interactive Patient Education  2017 [employer].

## 2021-02-22 DIAGNOSIS — C250 Malignant neoplasm of head of pancreas: Secondary | ICD-10-CM | POA: Diagnosis not present

## 2021-02-26 ENCOUNTER — Other Ambulatory Visit: Payer: Self-pay

## 2021-02-26 ENCOUNTER — Ambulatory Visit
Admission: RE | Admit: 2021-02-26 | Discharge: 2021-02-26 | Disposition: A | Discharge status: Home / Self Care | Payer: Self-pay | Source: Ambulatory Visit | Attending: Radiation Oncology | Admitting: Radiation Oncology

## 2021-02-26 ENCOUNTER — Other Ambulatory Visit: Payer: Self-pay | Admitting: Radiation Oncology

## 2021-02-26 ENCOUNTER — Inpatient Hospital Stay: Payer: Medicare HMO | Admitting: Nurse Practitioner

## 2021-02-26 ENCOUNTER — Encounter: Payer: Self-pay | Admitting: Nurse Practitioner

## 2021-02-26 ENCOUNTER — Telehealth: Payer: Self-pay

## 2021-02-26 ENCOUNTER — Other Ambulatory Visit: Payer: Self-pay | Admitting: Family Medicine

## 2021-02-26 ENCOUNTER — Encounter: Payer: Self-pay | Admitting: *Deleted

## 2021-02-26 VITALS — BP 126/68 | HR 69 | Temp 97.8°F | Resp 20 | Ht 68.0 in | Wt 172.4 lb

## 2021-02-26 DIAGNOSIS — Z79899 Other long term (current) drug therapy: Secondary | ICD-10-CM | POA: Diagnosis not present

## 2021-02-26 DIAGNOSIS — C250 Malignant neoplasm of head of pancreas: Secondary | ICD-10-CM

## 2021-02-26 DIAGNOSIS — Z5111 Encounter for antineoplastic chemotherapy: Secondary | ICD-10-CM | POA: Diagnosis not present

## 2021-02-26 DIAGNOSIS — K831 Obstruction of bile duct: Secondary | ICD-10-CM | POA: Diagnosis not present

## 2021-02-26 DIAGNOSIS — E119 Type 2 diabetes mellitus without complications: Secondary | ICD-10-CM | POA: Diagnosis not present

## 2021-02-26 DIAGNOSIS — E785 Hyperlipidemia, unspecified: Secondary | ICD-10-CM | POA: Diagnosis not present

## 2021-02-26 NOTE — Telephone Encounter (Signed)
Sorry.  Should be half to 2 tablets at bedtime.  Thanks

## 2021-02-26 NOTE — Telephone Encounter (Signed)
Last filled 01/22/21. Refill ordered. Controlled substance database (PDMP) reviewed. No concerns appreciated.

## 2021-02-26 NOTE — Telephone Encounter (Signed)
Caller name:Nathan Mora (Harris Vaughan Browner- called   On DPR? :Yes  Call back [phone]  Provider they see: [name]  Reason for call:need clarification better directions on LORazepam (ATIVAN) 1 MG tablet    TAKE HALF TO 2 TABLETS BY MOUTH AT BEDTIME AS NEEDED FOR ANXIETY; TAKE 1 TABLET EVERY 8 HOURS AS NEEDED FOR ANXIETY OR NAUSEA

## 2021-02-26 NOTE — Telephone Encounter (Signed)
Please clarify directions, one set state half to 2 tablets at bedtime as needed the other set of directions states take 1 every 8 hours as needed for anxiety or Nausea

## 2021-02-26 NOTE — Progress Notes (Signed)
Referral order placed today for Dr. Lisbeth Renshaw in radiation oncology. Secure chat message sent to referral coordinator to alert to new referral.

## 2021-02-26 NOTE — Telephone Encounter (Signed)
Patient is requesting a refill of the following medications: Requested Prescriptions   Pending Prescriptions Disp Refills   LORazepam (ATIVAN) 1 MG tablet [Pharmacy Med Name: LORazepam 1 MG TABLET] 60 tablet     Sig: TAKE HALF TO 2 TABLETS BY MOUTH AT BEDTIME AS NEEDED FOR ANXIETY; TAKE 1 TABLET EVERY 8 HOURS AS NEEDED FOR ANXIETY OR NAUSEA    Date of patient request: 02/26/21 Last office visit: 10/29/20 Date of last refill: 12/19/20 Last refill amount: 30 x1R

## 2021-02-26 NOTE — Telephone Encounter (Signed)
83: Spoke with pt confirming his appointment earlier this morning at 0915. Pt states he is "trying to check in now".

## 2021-02-26 NOTE — Progress Notes (Signed)
[facility] OFFICE PROGRESS NOTE   Diagnosis: Pancreas cancer  INTERVAL HISTORY:   [name] returns as scheduled.  He completed a cycle of gemcitabine/Abraxane 01/17/2021.  Energy level is poor.  He has a good appetite.  He notes hair loss.  He has intermittent abdominal pain.  When he lays down he notes back pain.  [name] saw Dr. Zenia Resides at [facility] on 02/22/2021.  The office note is not available at this time.  [name] reports surgery is not an option and Dr. Zenia Resides recommends a referral to radiation oncology.  Objective:  Vital signs in last 24 hours:  Blood pressure 126/68, pulse 69, temperature 97.8 F (36.6 C), temperature source Oral, resp. rate 20, height '5\' 8"'  (1.727 m), weight 172 lb 6.4 oz (78.2 kg), SpO2 100 %.    HEENT: No thrush or ulcers. Resp: Lungs clear bilaterally. Cardio: Regular rate and rhythm. GI: No hepatomegaly. Vascular: No leg edema. Port-A-Cath without erythema.  Lab Results:  Lab Results  Component Value Date   WBC 14.7 (H) 01/31/2021   HGB 11.2 (L) 01/31/2021   HCT 34.4 (L) 01/31/2021   MCV 95.0 01/31/2021   PLT 84 (L) 01/31/2021   NEUTROABS 9.5 (H) 01/31/2021    Imaging:  No results found.  Medications: I have reviewed the patient's current medications.  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  Fat planes around the celiac axis  preserved.  Less than 90 degree contact of the lateral wall of the SMA, no evidence of metastatic disease, 3 mm left lower lobe pulmonary nodule Cycle 1 FOLFOX 05/09/2020 (plan to add irinotecan with cycle 2 pending tolerance) Cycle 2 FOLFIRINOX 05/23/2020 Cycle 3 FOLFIRINOX 06/06/2020, oxaliplatin dose reduced secondary to thrombocytopenia Cycle 4 FOLFIRINOX 06/27/2020, oxaliplatin further dose reduced secondary to thrombocytopenia CTs at [facility] 07/03/2020-redemonstration of a heterogeneous pancreatic head mass with borderline encasement of the SMA as well as abutment of the portal vein, SMV and likely the IVC.  Similar mildly enlarged porta hepatis and pericaval lymph nodes.  No convincing evidence of distant metastatic disease. 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  More than 180 degree involvement of the SMV and SMA Cycle 1 gemcitabine/Abraxane 11/22/2020, [place] counselor contacted him 11/26/2020-no pathogenic variants identified in the 84 genes analyzed.  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 Cycle 2 gemcitabine/Abraxane 12/06/2020, Udenyca Cycle 3 gemcitabine/Abraxane 12/19/2020, Udenyca Cycle 4 gemcitabine/Abraxane 01/03/2021, Udenyca Cycle 5 gemcitabine/Abraxane 01/17/2021, Udenyca Cycle 6 gemcitabine/Abraxane 01/31/2021, Udenyca CTs at [facility] 02/22/2021-similar pancreatic head mass with involvement of SMA/SMV.  No  evidence of distant metastatic disease. Referred to Dr. Lisbeth Renshaw 02/26/2021 Obstructive jaundice secondary #1 ERCP 04/11/2020-prominent and floppy major papilla, dilated bile duct secondary to a distal stricture, placement of an uncovered metal stent, bile duct brushings-reactive glandular cells 3.   Diabetes   4.   Hyperlipidemia   5.   Glaucoma   6.   Port-A-Cath placement 05/04/2020 Interventional Radiology   7.   Mild thrombocytopenia (121,000) 05/09/2020  Disposition: Mr. Aubry appears unchanged.  He was most recently treated with Gemcitabine/Abraxane.  Restaging CTs at [facility] on 02/22/2021 showed similar pancreatic head mass with involvement of SMA/SMV.  There was no evidence of metastatic disease.  The tumor is not resectable.  We made a referral to Dr. Lisbeth Renshaw, radiation oncology, to consider a course of radiation to the pancreas mass.  We discussed radiation with concurrent Xeloda versus SBRT.  We reviewed potential toxicities associated with Xeloda including bone marrow toxicity, nausea, mouth sores, diarrhea, hand-foot syndrome.  We will follow-up with Dr. Lisbeth Renshaw regarding whether or not to prescribe Xeloda.  Mr. Bublitz will return for lab and follow-up in 3 weeks.  We are available to see him sooner if needed.  Patient seen with Dr. Benay Spice.    Dyami Umbach ANP/GNP-BC   02/26/2021  9:43 AM  This was a shared visit with [name].  Mr. Whidbee underwent a restaging evaluation at [facility] 02/22/2021.  Suspect there is persistent evidence of a locally advanced tumor with involvement of the SMA and SMV.  He does not appear to be a surgical candidate.  There is no evidence of distant metastatic disease.  We will refer him to Dr. Lisbeth Renshaw to consider pancreas radiation with either SBRT or standard fractionated radiation.  We will deliver concurrent Xeloda if Dr. Lisbeth Renshaw recommends standard radiation.  I was present greater than 50% of today's visit.  I performed medical decision making.  [name],  MD

## 2021-02-27 ENCOUNTER — Encounter: Payer: Self-pay | Admitting: Oncology

## 2021-02-27 NOTE — Telephone Encounter (Signed)
Called and gave clarification to the pharmacy.

## 2021-02-28 ENCOUNTER — Telehealth (INDEPENDENT_AMBULATORY_CARE_PROVIDER_SITE_OTHER): Payer: Medicare HMO | Admitting: Family Medicine

## 2021-02-28 ENCOUNTER — Encounter: Payer: Self-pay | Admitting: Family Medicine

## 2021-02-28 VITALS — Wt 170.0 lb

## 2021-02-28 DIAGNOSIS — R350 Frequency of micturition: Secondary | ICD-10-CM

## 2021-02-28 DIAGNOSIS — Z87438 Personal history of other diseases of male genital organs: Secondary | ICD-10-CM

## 2021-02-28 DIAGNOSIS — E1165 Type 2 diabetes mellitus with hyperglycemia: Secondary | ICD-10-CM | POA: Diagnosis not present

## 2021-02-28 DIAGNOSIS — C250 Malignant neoplasm of head of pancreas: Secondary | ICD-10-CM | POA: Diagnosis not present

## 2021-02-28 DIAGNOSIS — R351 Nocturia: Secondary | ICD-10-CM | POA: Diagnosis not present

## 2021-02-28 NOTE — Patient Instructions (Addendum)
Take long acting insulin at night consistently for now - limit to 8 units per night.  Continue metformin - increase to twice per day for now with meals. Labs orders at [facility] tomorrow if possible. Sliding scale only if needed.  Watch for low blood sugars and if those occur we will need to lower meds.  Follow up in 1 week with home readings - fasting, 2 hours after meal. We can decide on changes based on those levels.  Depending on urine and psa test results can discuss other treatments if needed.  Let me know if there are new symptoms or questions.  Take care.

## 2021-02-28 NOTE — Progress Notes (Signed)
Virtual Visit via Video Note  I connected with Nathan Mora on 02/28/21 at 5:10 PM by a video enabled telemedicine application and verified that I am speaking with the correct person using two identifiers.  Patient location: home - with wife, consent obtained.  My location: office -[place]   I discussed the limitations, risks, security and privacy concerns of performing an evaluation and management service by telephone and the availability of in person appointments. I also discussed with the patient that there may be a patient responsible charge related to this service. The patient expressed understanding and agreed to proceed, consent obtained  Chief complaint:  Chief Complaint  Patient presents with   Insomnia    Pt reports has not been sleeping well over the last week, reports has to frequently get up at night to urinate and has increased this issue reports average is 1 trip per night now at 2-3, concerned doxazosin should be increased      History of Present Illness: Nathan Mora is a 76 y.o. male  Insomnia with nocturia,  hx of pancreatic CA.  See prior notes.  History of prostatitis.  History of reactive insomnia/anxiety with pancreatic cancer, some setbacks with available treatment options when discussed in October.  Saw new surgeon at [facility], Dr. Zenia Resides. Had further testing, and unfortunately surgery is not an option. Recommended radiation treatment. Will be meeting with Dr. Lisbeth Renshaw with radiation oncology soon.   Had been previously treated for prostatitis in September after previous urinary catheterization for surgery, PSA had increased from 1.99 up to 13.29.  Treated with Levaquin at that time.  On chronic doxazosin for lower urinary tract symptoms.  Ultimately decided on lorazepam 1 mg at night with second dose if needed for insomnia.   Last week when lying down flat, feels like a boulder on stomach, trouble getting comfortable and difficulty getting to sleep.  Last 2 nights  boulder sensation not present, but still nocturia 2-3 times per night. No vomiting or stool changes. Normal BM QD.  Recently has had worsening nocturia, usually once per night, now 2-3.  Frequent wakening due to the symptoms. Taking 2 lorazepam at night (1 initially, then 2nd one hr later).  No dysuria, fever, hematuria.  Some increased daytime urinary frequency - later in day after elevated readings.    Diabetes: Was under treatment by endocrinology, had been on sliding scale insulin, then not necessary since June of last year - only on metformin 588m QAM. Has CGM, and increased readings past 4-6 weeks, now back on sliding scale. Typical day requirements  Low 100's in morning, up to 300 during day - midday.   Last symptomatic low about 2 weeks ago.  No nighttime sliding scale used.  Basaglar - 18 units ordered prior. Blood sugar was dropping too low few months ago - he self decreased to 9 units per night now IF over 200.  Last few weeks taking 4-5 days per week.  Glucose 210 on labs 02/22/21.  [facility]    Lab Results  Component Value Date   HGBA1C 7.0 (H) 12/21/2019   HGBA1C 7.3 (H) 09/02/2019   HGBA1C 6.9 (H) 04/08/2019   Lab Results  Component Value Date   LDLCALC 64 12/21/2019   CREATININE 0.69 01/31/2021       Patient Active Problem List   Diagnosis Date Noted   Genetic testing 11/23/2020   Family history of kidney cancer 11/07/2020   Goals of care, counseling/discussion 10/25/2020  Malignant neoplasm of head of pancreas ([place]) 04/26/2020   Excessive urination at night 12/21/2019   Chronic diarrhea 03/31/2019   Dermatochalasis 11/29/2012   Primary open angle glaucoma of both eyes 11/29/2012   Family history of early CAD 04/23/2012   Hyperlipidemia    Glaucoma    Lipid disorder 03/29/2012   Past Medical History:  Diagnosis Date   Cancer [facility])    Diabetes mellitus without complication ([place])    Glaucoma    left eye   Hyperlipidemia     Lipid disorder 03/29/2012   Past Surgical History:  Procedure Laterality Date   BILIARY BRUSHING  04/11/2020   Procedure: BILIARY BRUSHING;  Surgeon: Irving Copas., MD;  Location: [facility];  Service: Gastroenterology;;   BILIARY DILATION  10/23/2020   Procedure: BILIARY DILATION;  Surgeon: Irving Copas., MD;  Location: [facility];  Service: Gastroenterology;;   BILIARY STENT PLACEMENT N/A 04/11/2020   Procedure: [place];  Surgeon: Irving Copas., MD;  Location: [facility];  Service: Gastroenterology;  Laterality: N/A;   BILIARY STENT PLACEMENT N/A 10/23/2020   Procedure: BILIARY STENT PLACEMENT;  Surgeon: Rush Landmark Telford Nab., MD;  Location: [facility];  Service: Gastroenterology;  Laterality: N/A;   BIOPSY  04/11/2020   Procedure: BIOPSY;  Surgeon: Rush Landmark Telford Nab., MD;  Location: [facility];  Service: Gastroenterology;;   ENDOSCOPIC RETROGRADE CHOLANGIOPANCREATOGRAPHY (ERCP) WITH PROPOFOL N/A 04/11/2020   Procedure: ENDOSCOPIC RETROGRADE CHOLANGIOPANCREATOGRAPHY (ERCP) WITH PROPOFOL;  Surgeon: Irving Copas., MD;  Location: [facility];  Service: Gastroenterology;  Laterality: N/A;   ENDOSCOPIC RETROGRADE CHOLANGIOPANCREATOGRAPHY (ERCP) WITH PROPOFOL N/A 10/23/2020   Procedure: ENDOSCOPIC RETROGRADE CHOLANGIOPANCREATOGRAPHY (ERCP) WITH PROPOFOL;  Surgeon: Rush Landmark Telford Nab., MD;  Location: [facility];  Service: Gastroenterology;  Laterality: N/A;   ESOPHAGOGASTRODUODENOSCOPY (EGD) WITH PROPOFOL N/A 04/11/2020   Procedure: ESOPHAGOGASTRODUODENOSCOPY (EGD) WITH PROPOFOL;  Surgeon: Rush Landmark Telford Nab., MD;  Location: [facility];  Service: Gastroenterology;  Laterality: N/A;   EUS N/A 04/11/2020   Procedure: UPPER ENDOSCOPIC ULTRASOUND (EUS) RADIAL;  Surgeon: Irving Copas., MD;  Location: [facility];  Service: Gastroenterology;  Laterality: N/A;   FINE NEEDLE ASPIRATION  04/11/2020   Procedure: FINE  NEEDLE ASPIRATION (FNA) LINEAR;  Surgeon: Irving Copas., MD;  Location: [facility];  Service: Gastroenterology;;   IR IMAGING GUIDED PORT INSERTION  05/04/2020   PANCREATIC STENT PLACEMENT  04/11/2020   Procedure: PANCREATIC STENT PLACEMENT;  Surgeon: Irving Copas., MD;  Location: [facility];  Service: Gastroenterology;;   REMOVAL OF STONES  10/23/2020   Procedure: REMOVAL OF STONES;  Surgeon: Irving Copas., MD;  Location: [facility];  Service: Gastroenterology;;   [name]  04/11/2020   Procedure: [name];  Surgeon: Irving Copas., MD;  Location: [facility];  Service: Gastroenterology;;   No Known Allergies Prior to Admission medications   Medication Sig Start Date End Date Taking? Authorizing Provider  acetaminophen (TYLENOL) 500 MG tablet Take 1,000 mg by mouth every 8 (eight) hours as needed for mild pain.    [provider]  atorvastatin (LIPITOR) 40 MG tablet TAKE 1/2 TABLET BY MOUTH DAILY AT 6 IN THE EVENING 01/09/21   [name], MD  BD PEN NEEDLE NANO 2ND GEN 32G X 4 MM MISC  08/16/20   [provider]  blood glucose meter kit and supplies Test once per day - fasting or 2 hours after meal. Dispense based on patient and insurance preference. 04/15/20   [name], MD  brimonidine-timolol (COMBIGAN) 0.2-0.5 % ophthalmic  solution Place 1 drop into the left eye every 12 (twelve) hours.    [provider]  Continuous Blood Gluc Sensor (FREESTYLE LIBRE 2 SENSOR) MISC Inject 1 sensor to the skin every 14 days for continuous glucose monitoring. 05/23/20   [provider]  COVID-19 mRNA bivalent vaccine, Pfizer, (PFIZER COVID-19 VAC BIVALENT) injection Inject into the muscle. 12/06/20     CREON 36000-114000 units CPEP capsule TAKE 1 CAPSULE BY MOUTH WITH MEALS AND WITH SNACKS AS DIRECTED BY YOUR DOCTOR 02/07/21   [name], MD  doxazosin (CARDURA) 2 MG tablet TAKE ONE TABLET BY MOUTH DAILY  10/30/20   [name], MD  FLUZONE HIGH-DOSE QUADRIVALENT 0.7 ML SUSY  11/10/20   [provider]  gabapentin (NEURONTIN) 800 MG tablet Take 400 mg by mouth at bedtime as needed (pain).    [provider]  ibuprofen (ADVIL) 200 MG tablet Take 400 mg by mouth every evening.    [provider]  insulin aspart (NOVOLOG) 100 UNIT/ML FlexPen Inject 0-6 Units into the skin 3 (three) times daily with meals. Dose per sliding scale, 150-200= 2 units, 201-250= 4 units, 251-300= 6 units 05/08/20   [provider]  Insulin Glargine (BASAGLAR KWIKPEN) 100 UNIT/ML Inject 9 Units into the skin at bedtime. 05/22/20   [provider]  Lancets (ONETOUCH DELICA PLUS [id number]) Center 2 HOURS AFTER MEAL 07/13/20   [name], MD  latanoprost (XALATAN) 0.005 % ophthalmic solution Place 1 drop into the left eye at bedtime.    [provider]  latanoprost (XALATAN) 0.005 % ophthalmic solution Apply to eye.    [provider]  lidocaine-prilocaine (EMLA) cream Apply 1 application topically as needed. 05/07/20   [name], MD  LORazepam (ATIVAN) 1 MG tablet TAKE HALF TO 2 TABLETS BY MOUTH AT BEDTIME AS NEEDED FOR ANXIETY; TAKE 1 TABLET EVERY 8 HOURS AS NEEDED FOR ANXIETY OR NAUSEA 02/26/21   [name], MD  metFORMIN (GLUCOPHAGE) 500 MG tablet Take 500 mg by mouth daily. 11/24/20   [provider]  omeprazole (PRILOSEC) 20 MG capsule Take 2 capsules (40 mg total) by mouth daily. Patient not taking: Reported on 02/21/2021 01/17/21   Owens Shark, NP  ondansetron (ZOFRAN) 8 MG tablet Take 1 tablet (8 mg total) by mouth every 8 (eight) hours as needed for nausea or vomiting. Do not begin until 72 hours after day 1 chemotherapy 05/11/20   Owens Shark, NP  [facility] ULTRA test strip TEST EVERY DAY FASTING OR 2 HOURS AFTER A MEAL 12/13/19   [name], MD  prochlorperazine (COMPAZINE) 10 MG tablet Take  1 tablet (10 mg total) by mouth every 6 (six) hours as needed for nausea or vomiting. 07/12/20   [name], MD  sildenafil (REVATIO) 20 MG tablet 1 to 3 tabs up to QD prior to onset of sexual activity. Patient taking differently: Take 20-60 mg by mouth daily as needed (erectile dysfunction). 04/08/19   [name], MD  terbinafine (LAMISIL) 250 MG tablet  01/31/20   [provider]   Social History   Socioeconomic History   Marital status: Married    Spouse name: Not on file   Number of children: Not on file   Years of education: Not on file   Highest education level: Not on file  Occupational History   Not on file  Tobacco Use   Smoking status: Never  Smokeless tobacco: Never  Substance and Sexual Activity   Alcohol use: Yes    Alcohol/week: 4.0 standard drinks    Types: 4 Cans of beer per week   Drug use: No   Sexual activity: Yes  Other Topics Concern   Not on file  Social History Narrative   Not on file   Social Determinants of Health   Financial Resource Strain: Low Risk    Difficulty of Paying Living Expenses: Not hard at all  Food Insecurity: No Food Insecurity   Worried About Charity fundraiser in the Last Year: Never true   [place] in the Last Year: Never true  Transportation Needs: No Transportation Needs   Lack of Transportation (Medical): No   Lack of Transportation (Non-Medical): No  Physical Activity: Sufficiently Active   Days of Exercise per Week: 3 days   Minutes of Exercise per Session: 50 min  Stress: No Stress Concern Present   Feeling of Stress : Not at all  Social Connections: Socially Integrated   Frequency of Communication with Friends and Family: Three times a week   Frequency of Social Gatherings with Friends and Family: Three times a week   Attends Religious Services: More than 4 times per year   Active Member of Clubs or Organizations: Yes   Attends Music therapist: More than 4 times per year    Marital Status: Married  Human resources officer Violence: Not At Risk   Fear of Current or Ex-Partner: No   Emotionally Abused: No   Physically Abused: No   Sexually Abused: No    Observations/Objective: Vitals:   02/28/21 1625  Weight: 170 lb (77.1 kg)  Nontoxic appearance on video.  Speaking in full sentences, no respiratory distress.  Coherent responses, euthymic mood.  All questions were answered with understanding of plan expressed.   Assessment and Plan: Nocturia - Plan: PSA, Urine Microscopic, POCT urinalysis dipstick  Type 2 diabetes mellitus with hyperglycemia, without long-term current use of insulin (HCC) - Plan: Hemoglobin A1c  Malignant neoplasm of head of pancreas (HCC)  History of prostatitis - Plan: PSA  Urinary frequency - Plan: PSA, Urine Microscopic, POCT urinalysis dipstick  New urinary frequency, nocturia, primarily frequency in the afternoon when his blood sugars have been elevated.  Possibly inconsistent diabetes control as contributor to nocturia but differential also includes infection given prior history of prostatitis.  Home diabetes medicine regimen discussed as above with clarification during visit.  Last symptomatic hypoglycemia approximately 10 to 14 days ago.  Inconsistent basal insulin use may be in part responsible for elevated afternoon readings.  -Recommended consistent basal insulin at 8 units/day for now.  Increase metformin to 500 mg twice daily.  Monitor for hypoglycemia and will decrease meds if that occurs.  Has CGM with alerts.   -Check A1c  -Check urinalysis, micro, PSA at lab visit tomorrow to evaluate for infection.  -Recheck 1 week with home readings fasting, postprandial for further adjustments.  -Most recent CT imaging on January 27 noted with 5.3 x 2.9 cm pancreatic mass, plan for follow-up treatment with radiation oncology. No other abdominal mass noted.  May need an office evaluation versus other imaging if return of abdominal fullness, but  improved as above.  RTC/ER precautions given.  Follow Up Instructions: 1 week - virtual ok if needed.    I discussed the assessment and treatment plan with the patient. The patient was provided an opportunity to ask questions and all were answered. The patient  agreed with the plan and demonstrated an understanding of the instructions.   The patient was advised to call back or seek an in-person evaluation if the symptoms worsen or if the condition fails to improve as anticipated.    [name], MD

## 2021-03-01 ENCOUNTER — Other Ambulatory Visit (INDEPENDENT_AMBULATORY_CARE_PROVIDER_SITE_OTHER): Payer: Medicare HMO

## 2021-03-01 DIAGNOSIS — R350 Frequency of micturition: Secondary | ICD-10-CM

## 2021-03-01 DIAGNOSIS — E1165 Type 2 diabetes mellitus with hyperglycemia: Secondary | ICD-10-CM

## 2021-03-01 DIAGNOSIS — Z87438 Personal history of other diseases of male genital organs: Secondary | ICD-10-CM | POA: Diagnosis not present

## 2021-03-01 DIAGNOSIS — R351 Nocturia: Secondary | ICD-10-CM | POA: Diagnosis not present

## 2021-03-01 LAB — HEMOGLOBIN A1C: Hgb A1c MFr Bld: 6.9 % — ABNORMAL HIGH (ref 4.6–6.5)

## 2021-03-01 LAB — URINALYSIS, MICROSCOPIC ONLY

## 2021-03-01 LAB — PSA: PSA: 8.02 ng/mL — ABNORMAL HIGH (ref 0.10–4.00)

## 2021-03-02 ENCOUNTER — Encounter: Payer: Self-pay | Admitting: Family Medicine

## 2021-03-02 ENCOUNTER — Other Ambulatory Visit: Payer: Self-pay | Admitting: Oncology

## 2021-03-02 DIAGNOSIS — C250 Malignant neoplasm of head of pancreas: Secondary | ICD-10-CM

## 2021-03-04 ENCOUNTER — Telehealth: Payer: Self-pay

## 2021-03-04 ENCOUNTER — Encounter: Payer: Self-pay | Admitting: Oncology

## 2021-03-04 NOTE — Telephone Encounter (Signed)
Caller name:Tom Read   On DPR? :Yes  Call back number:[phone]  Provider they see: [name]   Reason for call:Pt wants Dr [name] to go over blood work and urine test

## 2021-03-04 NOTE — Progress Notes (Signed)
Radiation Oncology         [phone] ________________________________  Name: Nathan Mora        MRN: [id number]  Date of Service: 03/05/2021 DOB: 1945-12-22  FE:[id number], [name], MD  [name], MD     REFERRING PHYSICIAN: [name], MD   DIAGNOSIS: The encounter diagnosis was Malignant neoplasm of head of pancreas [facility]).   HISTORY OF PRESENT ILLNESS: Nathan Mora is a 76 y.o. male seen at the request of Dr. Benay Spice for newly diagnosed pancreas cancer.  The patient  presented with a mass in the pancreatic head in the early part of 2022.  He underwent EUS and on 6 04/11/2020 showing a mass in the head of the pancreas measuring up to 4.1 cm classified as T3, N0 disease but the lesion abutted the portal vein.  Fine-needle aspirate showed adenocarcinoma.  He started neoadjuvant chemotherapy on 05/09/2020 and completed 8 cycles of this on 08/23/2020.  He underwent exploratory laparotomy on 10/05/2020 and at that time cholecystectomy was performed intraoperative ultrasound and excisional biopsy of retroperitoneal tissue.  Ultimately the tumor in the pancreas was felt to involve the origin of the left renal vein portal vein and SMA and a biopsy of this area in the left renal vein was positive for disease.  At that time it was felt that he was unresectable.  Pancreatic protocol CT scans at [facility] in October 2022 showed an increase in the size of the mass and he was subsequently started on systemic therapy on 11/22/2020 with Gemzar Abraxane.  His most recent cycle of this was on 01/31/2021.  Repeat imaging at [facility] on 02/22/2021 showed similar appearance in the pancreatic head mass with involvement of the SMA and SMV.  No metastatic disease was identified.  Given these findings he is seen to discuss options of radiotherapy.    PREVIOUS RADIATION THERAPY: No   PAST MEDICAL HISTORY:  Past Medical History:  Diagnosis Date   Cancer ([place])    Diabetes mellitus without complication ([place])     Glaucoma    left eye   Hyperlipidemia    Lipid disorder 03/29/2012       PAST SURGICAL HISTORY: Past Surgical History:  Procedure Laterality Date   BILIARY BRUSHING  04/11/2020   Procedure: BILIARY BRUSHING;  Surgeon: Irving Copas., MD;  Location: [facility];  Service: Gastroenterology;;   BILIARY DILATION  10/23/2020   Procedure: BILIARY DILATION;  Surgeon: Irving Copas., MD;  Location: [facility];  Service: Gastroenterology;;   BILIARY STENT PLACEMENT N/A 04/11/2020   Procedure: BILIARY STENT PLACEMENT;  Surgeon: Irving Copas., MD;  Location: [facility];  Service: Gastroenterology;  Laterality: N/A;   BILIARY STENT PLACEMENT N/A 10/23/2020   Procedure: BILIARY STENT PLACEMENT;  Surgeon: Rush Landmark Telford Nab., MD;  Location: [facility];  Service: Gastroenterology;  Laterality: N/A;   BIOPSY  04/11/2020   Procedure: BIOPSY;  Surgeon: Rush Landmark Telford Nab., MD;  Location: [facility];  Service: Gastroenterology;;   ENDOSCOPIC RETROGRADE CHOLANGIOPANCREATOGRAPHY (ERCP) WITH PROPOFOL N/A 04/11/2020   Procedure: ENDOSCOPIC RETROGRADE CHOLANGIOPANCREATOGRAPHY (ERCP) WITH PROPOFOL;  Surgeon: Irving Copas., MD;  Location: [facility];  Service: Gastroenterology;  Laterality: N/A;   ENDOSCOPIC RETROGRADE CHOLANGIOPANCREATOGRAPHY (ERCP) WITH PROPOFOL N/A 10/23/2020   Procedure: ENDOSCOPIC RETROGRADE CHOLANGIOPANCREATOGRAPHY (ERCP) WITH PROPOFOL;  Surgeon: Rush Landmark Telford Nab., MD;  Location: [facility];  Service: Gastroenterology;  Laterality: N/A;   ESOPHAGOGASTRODUODENOSCOPY (EGD) WITH PROPOFOL N/A 04/11/2020   Procedure: ESOPHAGOGASTRODUODENOSCOPY (EGD) WITH PROPOFOL;  Surgeon: Rush Landmark,  Telford Nab., MD;  Location: [facility];  Service: Gastroenterology;  Laterality: N/A;   EUS N/A 04/11/2020   Procedure: UPPER ENDOSCOPIC ULTRASOUND (EUS) RADIAL;  Surgeon: Irving Copas., MD;  Location: [facility];  Service: Gastroenterology;   Laterality: N/A;   FINE NEEDLE ASPIRATION  04/11/2020   Procedure: FINE NEEDLE ASPIRATION (FNA) LINEAR;  Surgeon: Irving Copas., MD;  Location: [facility];  Service: Gastroenterology;;   IR IMAGING GUIDED PORT INSERTION  05/04/2020   PANCREATIC STENT PLACEMENT  04/11/2020   Procedure: PANCREATIC STENT PLACEMENT;  Surgeon: Irving Copas., MD;  Location: [facility];  Service: Gastroenterology;;   REMOVAL OF STONES  10/23/2020   Procedure: REMOVAL OF STONES;  Surgeon: Irving Copas., MD;  Location: [facility];  Service: Gastroenterology;;   [name]  04/11/2020   Procedure: [name];  Surgeon: Mansouraty, Telford Nab., MD;  Location: [facility];  Service: Gastroenterology;;     FAMILY HISTORY:  Family History  Problem Relation Age of Onset   Lung cancer Father    Heart attack Brother    Hypertension Brother    Kidney cancer Brother 1   Hypertension Brother      SOCIAL HISTORY:  reports that he has never smoked. He has never used smokeless tobacco. He reports current alcohol use of about 4.0 standard drinks per week. He reports that he does not use drugs.  Patient is married and lives in [place]. 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    ALLERGIES: Patient has no known allergies.   MEDICATIONS:  Current Outpatient Medications  Medication Sig Dispense Refill   acetaminophen (TYLENOL) 500 MG tablet Take 1,000 mg by mouth every 8 (eight) hours as needed for mild pain.     atorvastatin (LIPITOR) 40 MG tablet TAKE 1/2 TABLET BY MOUTH DAILY AT 6 IN THE EVENING 45 tablet 2   BD PEN NEEDLE NANO 2ND GEN 32G X 4 MM MISC      blood glucose meter kit and supplies Test once per day - fasting or 2 hours after meal. Dispense based on patient and insurance preference. 1 each 0   brimonidine-timolol (COMBIGAN) 0.2-0.5 % ophthalmic solution Place 1 drop into the left eye every 12 (twelve) hours.     Continuous Blood Gluc Sensor (FREESTYLE  LIBRE 2 SENSOR) MISC Inject 1 sensor to the skin every 14 days for continuous glucose monitoring.     COVID-19 mRNA bivalent vaccine, Pfizer, (PFIZER COVID-19 VAC BIVALENT) injection Inject into the muscle. 0.3 mL 0   CREON 36000-114000 units CPEP capsule TAKE 1 CAPSULE BY MOUTH WITH MEALS AND WITH SNACKS AS DIRECTED BY YOUR DOCTOR 450 capsule 0   doxazosin (CARDURA) 2 MG tablet TAKE ONE TABLET BY MOUTH DAILY 90 tablet 2   FLUZONE HIGH-DOSE QUADRIVALENT 0.7 ML SUSY      gabapentin (NEURONTIN) 800 MG tablet Take 400 mg by mouth at bedtime as needed (pain).     ibuprofen (ADVIL) 200 MG tablet Take 400 mg by mouth every evening.     insulin aspart (NOVOLOG) 100 UNIT/ML FlexPen Inject 0-6 Units into the skin 3 (three) times daily with meals. Dose per sliding scale, 150-200= 2 units, 201-250= 4 units, 251-300= 6 units     Insulin Glargine (BASAGLAR KWIKPEN) 100 UNIT/ML Inject 9 Units into the skin at bedtime.     Lancets (ONETOUCH DELICA PLUS DXIPJA25K) MISC TEST ONCE A DAY - FASTING OR 2 HOURS AFTER MEAL 100 each 0   latanoprost (XALATAN) 0.005 %  ophthalmic solution Place 1 drop into the left eye at bedtime.     latanoprost (XALATAN) 0.005 % ophthalmic solution Apply to eye.     lidocaine-prilocaine (EMLA) cream Apply 1 application topically as needed. 30 g 0   LORazepam (ATIVAN) 1 MG tablet TAKE HALF TO 2 TABLETS BY MOUTH AT BEDTIME AS NEEDED FOR ANXIETY; TAKE 1 TABLET EVERY 8 HOURS AS NEEDED FOR ANXIETY OR NAUSEA 60 tablet 1   metFORMIN (GLUCOPHAGE) 500 MG tablet Take 500 mg by mouth daily.     ondansetron (ZOFRAN) 8 MG tablet Take 1 tablet (8 mg total) by mouth every 8 (eight) hours as needed for nausea or vomiting. Do not begin until 72 hours after day 1 chemotherapy 20 tablet 0   ONETOUCH ULTRA test strip TEST EVERY DAY FASTING OR 2 HOURS AFTER A MEAL 100 strip 2   prochlorperazine (COMPAZINE) 10 MG tablet Take 1 tablet (10 mg total) by mouth every 6 (six) hours as needed for nausea or vomiting. 30  tablet 2   sildenafil (REVATIO) 20 MG tablet 1 to 3 tabs up to QD prior to onset of sexual activity. (Patient taking differently: Take 20-60 mg by mouth daily as needed (erectile dysfunction).) 30 tablet 3   terbinafine (LAMISIL) 250 MG tablet      omeprazole (PRILOSEC) 20 MG capsule Take 2 capsules (40 mg total) by mouth daily. (Patient not taking: Reported on 02/21/2021) 60 capsule 2   No current facility-administered medications for this encounter.     REVIEW OF SYSTEMS: On review of systems, the patient reports that he is doing well overall. He reports he has not had any abdominal pain during the day but occasionally has some pain when laying flat a night time. He denies any nausea or vomiting. His weight has been stable and he takes creon regularly on a sliding scale for pancreatic insufficiency. No other complaints are verbalized.   PHYSICAL EXAM:  Wt Readings from Last 3 Encounters:  03/05/21 172 lb (78 kg)  02/28/21 170 lb (77.1 kg)  02/26/21 172 lb 6.4 oz (78.2 kg)   Temp Readings from Last 3 Encounters:  02/26/21 97.8 F (36.6 C) (Oral)  02/01/21 98.3 F (36.8 C) (Oral)  01/31/21 98 F (36.7 C) (Oral)   BP Readings from Last 3 Encounters:  02/26/21 126/68  02/01/21 127/69  01/31/21 136/84   Pulse Readings from Last 3 Encounters:  02/26/21 69  02/01/21 71  01/31/21 65   Pain Assessment Pain Score: 0-No pain/10  In general this is a well appearing caucasian male in no acute distress. She 's alert and oriented x4 and appropriate throughout the examination. Cardiopulmonary assessment is negative for acute distress and he exhibits normal effort.     ECOG = 1  0 - Asymptomatic (Fully active, able to carry on all predisease activities without restriction)  1 - Symptomatic but completely ambulatory (Restricted in physically strenuous activity but ambulatory and able to carry out work of a light or sedentary nature. For example, light housework, office work)  2 -  Symptomatic, <50% in bed during the day (Ambulatory and capable of all self care but unable to carry out any work activities. Up and about more than 50% of waking hours)  3 - Symptomatic, >50% in bed, but not bedbound (Capable of only limited self-care, confined to bed or chair 50% or more of waking hours)  4 - Bedbound (Completely disabled. Cannot carry on any self-care. Totally confined to bed or chair)  5 -  Death   Eustace Pen MM, Creech RH, Tormey DC, et al. [phone]). "Toxicity and response criteria of the [facility]". [employer] Oncol. 5 (6): 649-55    LABORATORY DATA:  Lab Results  Component Value Date   WBC 14.7 (H) 01/31/2021   HGB 11.2 (L) 01/31/2021   HCT 34.4 (L) 01/31/2021   MCV 95.0 01/31/2021   PLT 84 (L) 01/31/2021   Lab Results  Component Value Date   NA 137 01/31/2021   K 3.9 01/31/2021   CL 103 01/31/2021   CO2 25 01/31/2021   Lab Results  Component Value Date   ALT 22 01/31/2021   AST 19 01/31/2021   ALKPHOS 191 (H) 01/31/2021   BILITOT 0.5 01/31/2021      RADIOGRAPHY: No results found.     IMPRESSION/PLAN: 1. Stage IIA, cT3N0M0, Unresectable Adenocarcinoma of the Pancreatic Head. Dr. Lisbeth Renshaw discusses the imaging and pathology findings to date as well as the patient's course and reviews the nature of pancreatic cancer.  Dr. Lisbeth Renshaw recommends proceeding with stereotactic body radiotherapy (SBRT). We discussed the rationale for fiducial marker placement and will reach out to Dr. Rush Landmark to coordinate this. We discussed the risks, benefits, short, and long term effects of radiotherapy, as well as the curative intent, and the patient is interested in proceeding. Dr. Lisbeth Renshaw discusses the delivery and logistics of radiotherapy and anticipates a course of 5 fractions of radiotherapy.  He will be contacted to coordinate simulation once we know his fiducial placement date, and when he simulates will sign written consent to proceed.   This  encounter was provided by telemedicine platform MyChart.  The patient has provided two factor identification and has given verbal consent for this type of encounter and has been advised to only accept a meeting of this type in a secure network environment. The time spent during this encounter was 60 minutes including preparation, discussion, and coordination of the patient's care. The attendants for this meeting include Blenda Nicely, RN, Dr. Lisbeth Renshaw, Hayden Pedro  and [name].  During the encounter,  Blenda Nicely, RN, Dr. Lisbeth Renshaw, and Hayden Pedro were located at [facility] Radiation Oncology Department.  Nathan Mora was located at home.     The above documentation reflects my direct findings during this shared patient visit. Please see the separate note by Dr. Lisbeth Renshaw on this date for the remainder of the patient's plan of care.    Carola Rhine, [facility]   **Disclaimer: This note was dictated with voice recognition software. Similar sounding words can inadvertently be transcribed and this note may contain transcription errors which may not have been corrected upon publication of note.**

## 2021-03-04 NOTE — Progress Notes (Signed)
GI Location of Tumor / Histology: Pancreas- Head  Nathan Mora presented in February 2022 with 8 month history of diarrhea and new onset jaundice.  CT 04/19/2020: heterogenous pancreas head mass measuring 3.3 x 2.9 cm, mass contiguous with the anterior wall the portal splenic confluence with no encasement of the portal vein or superior mesenteric vein.  Fat planes around the celiac axis preserved.  Less than 90 degree contact of the lateral wall of the SMA, no evidence of metastatic disease, 3 mm left lower lobe pulmonary nodule  EUS 04/11/2020: Pancreas head mass, 41 x 26 mm with a 13 x 21 mm cystic component, abutment of the portal vein, fine needle biopsy, adenocarcinoma.  MRCP 04/05/2020: 2.6 x 2.2 cm complex cystic/partially solid lesion in the pancreas head with common bile duct and pancreatic duct obstruction, no evidence for vascular involvement/invasion, no evidence of abdominal metastatic disease.    Biopsies of Pancreas Head Mass 04/11/2020    Past/Anticipated interventions by surgeon, if any:  Nathan Glasgow Threatt NP / Dr. Zenia Mora 02/22/2021-[facility] -He had 8 cycles of neoadjuvant chemotherapy in which his tumor markers responded and he was taken to surgery on 10/05/20.  -In the OR, they found tumor involvement of the left renal vein at its insertion of the IVC and aborted the resection after performing a cholecystectomy. -He has now been on therapy for close to a year, most recently with gemcitabine/abraxane. Imaging today continues to demonstrate persistent inflammation at the tumor site which precludes surgical resection. CA19-9 continues to decline which is very reassuring.  -We will therefore recommend consideration of radiation therapy. We would re-image after 3 months of therapy and decide about possible exploration and surgical resection vs treatment break. He is in agreement with his plan.   Past/Anticipated interventions by medical oncology, if any:  Nathan Card NP / Dr. Benay Mora  02/26/2021 -s/p 8 cycles FOLFIRINOX 08/23/2020 -S/P 6 vycles Gemcitabine/Abraxane 01/31/2021 -Restaging CTs at [place] on 02/22/2021 showed similar pancreatic head mass with involvement of SMA/SMV.   -There was no evidence of metastatic disease.  The tumor is not resectable. -We made a referral to Dr. Lisbeth Mora, radiation oncology, to consider a course of radiation to the pancreas mass.  We discussed radiation with concurrent Xeloda versus SBRT.    Weight changes, if any: Steady  Bowel/Bladder complaints, if any: No  Nausea / Vomiting, if any: No  Pain issues, if any:  Notes occasional/intermittent abdominal pain when he lays down at night to sleep.    Appetite: No     SAFETY ISSUES: Prior radiation? No Pacemaker/ICD? No Possible current pregnancy? N/a Is the patient on methotrexate? No  Current Complaints/Details:

## 2021-03-05 ENCOUNTER — Other Ambulatory Visit: Payer: Self-pay

## 2021-03-05 ENCOUNTER — Ambulatory Visit
Admission: RE | Admit: 2021-03-05 | Discharge: 2021-03-05 | Disposition: A | Discharge status: Home / Self Care | Payer: Medicare HMO | Source: Ambulatory Visit | Attending: Radiation Oncology | Admitting: Radiation Oncology

## 2021-03-05 ENCOUNTER — Encounter: Payer: Self-pay | Admitting: Radiation Oncology

## 2021-03-05 ENCOUNTER — Other Ambulatory Visit: Payer: Self-pay | Admitting: Family Medicine

## 2021-03-05 VITALS — Ht 68.0 in | Wt 172.0 lb

## 2021-03-05 DIAGNOSIS — C250 Malignant neoplasm of head of pancreas: Secondary | ICD-10-CM | POA: Diagnosis not present

## 2021-03-05 DIAGNOSIS — N419 Inflammatory disease of prostate, unspecified: Secondary | ICD-10-CM

## 2021-03-05 MED ORDER — LEVOFLOXACIN 500 MG PO TABS: 500.0000 mg | ORAL_TABLET | Freq: Every day | ORAL | 0 refills | Status: AC

## 2021-03-05 NOTE — Progress Notes (Signed)
See labs. Levaquin ordered for prostatitis.  Previously treated with same and potential risks and side effects were discussed including tendinopathy risks.

## 2021-03-05 NOTE — Telephone Encounter (Signed)
Pt is asking about lab results from 03/01/21, if you could review when you have the opportunity

## 2021-03-06 ENCOUNTER — Telehealth: Payer: Self-pay | Admitting: Radiation Oncology

## 2021-03-06 ENCOUNTER — Other Ambulatory Visit: Payer: Self-pay

## 2021-03-06 ENCOUNTER — Telehealth: Payer: Self-pay

## 2021-03-06 DIAGNOSIS — C259 Malignant neoplasm of pancreas, unspecified: Secondary | ICD-10-CM

## 2021-03-06 NOTE — Telephone Encounter (Signed)
We received a message from Dr. Rush Landmark about EGD with EUS fiducial marker placement which is to take place tomorrow. I called the patient and he is aware of the procedure. I messaged our staff to coordinate simulation next week. He is in agreement with this plan.

## 2021-03-06 NOTE — Telephone Encounter (Signed)
The pt has been scheduled for EUS EGD Fiducial markers on 03/07/21 1045 am [facility] with GM  EUS scheduled, pt instructed and medications reviewed.  Patient instructions discussed over the phone.  Patient to call with any questions or concerns.    The pt is not on any blood thinners.

## 2021-03-06 NOTE — Telephone Encounter (Signed)
Happy to help.  Hopefully does not get any pancreatitis from the fiducial placement and you guys can get things rolling soon. Thanks. GM

## 2021-03-06 NOTE — Telephone Encounter (Signed)
-----   Message from Irving Copas., MD sent at 03/06/2021  8:46 AM EST ----- Chong Sicilian or Donita Brooks We had a cancellation of a patient on [facility] schedule tomorrow. I have called [facility] and they still have my slot available. Let's put the patient on for EGD/EUS Fiducial placement tomorrow to follow my current cases. I spoke with patient and he is up for this. Thanks. GM ----- Message ----- From: Hayden Pedro, PA-C Sent: 03/05/2021   1:44 PM EST To: Timothy Lasso, RN, Irving Copas., MD  Thanks! He has been off chemo for about a month, so whatever is soonest would be great! We would simulate for treatment 3 days or so after fiducials then start about a week later.    ----- Message ----- From: Irving Copas., MD Sent: 03/05/2021  12:08 PM EST To: Hayden Pedro, PA-C, #  ACP, We should be able to help. We can certainly look at some dates. What is the timeline to begin this look like? I'll work with Chong Sicilian to try and find a slot for him with me soon.  Elhadj Girton, I'm going to call the hospital to try and find a slot for me in the next couple of weeks (probably at a lunch time), then we can reconvene in the next day or so. What's my next available that you see on the schedule though Hennessy Bartel for my understanding. Thanks. GM ----- Message ----- From: Hayden Pedro, PA-C Sent: 03/05/2021  10:15 AM EST To: Timothy Lasso, RN, Irving Copas., MD  Hey there- we're offering SBRT to this pt. Could you consider putting in fiducials into the head of pancreas tumor?

## 2021-03-07 ENCOUNTER — Encounter (HOSPITAL_COMMUNITY)
Admission: RE | Disposition: A | Discharge status: Home / Self Care | Payer: Self-pay | Source: Ambulatory Visit | Attending: Gastroenterology

## 2021-03-07 ENCOUNTER — Ambulatory Visit (HOSPITAL_COMMUNITY): Payer: Medicare HMO | Admitting: Certified Registered"

## 2021-03-07 ENCOUNTER — Ambulatory Visit (HOSPITAL_COMMUNITY)
Admission: RE | Admit: 2021-03-07 | Discharge: 2021-03-07 | Disposition: A | Discharge status: Home / Self Care | Payer: Medicare HMO | Source: Ambulatory Visit | Attending: Gastroenterology | Admitting: Gastroenterology

## 2021-03-07 ENCOUNTER — Ambulatory Visit (HOSPITAL_BASED_OUTPATIENT_CLINIC_OR_DEPARTMENT_OTHER): Payer: Medicare HMO | Admitting: Certified Registered"

## 2021-03-07 ENCOUNTER — Encounter (HOSPITAL_COMMUNITY): Payer: Self-pay | Admitting: Gastroenterology

## 2021-03-07 DIAGNOSIS — Z794 Long term (current) use of insulin: Secondary | ICD-10-CM | POA: Diagnosis not present

## 2021-03-07 DIAGNOSIS — C250 Malignant neoplasm of head of pancreas: Secondary | ICD-10-CM | POA: Insufficient documentation

## 2021-03-07 DIAGNOSIS — K8689 Other specified diseases of pancreas: Secondary | ICD-10-CM

## 2021-03-07 DIAGNOSIS — K2289 Other specified disease of esophagus: Secondary | ICD-10-CM

## 2021-03-07 DIAGNOSIS — K3189 Other diseases of stomach and duodenum: Secondary | ICD-10-CM | POA: Insufficient documentation

## 2021-03-07 DIAGNOSIS — C259 Malignant neoplasm of pancreas, unspecified: Secondary | ICD-10-CM | POA: Diagnosis not present

## 2021-03-07 DIAGNOSIS — E119 Type 2 diabetes mellitus without complications: Secondary | ICD-10-CM | POA: Insufficient documentation

## 2021-03-07 DIAGNOSIS — K2970 Gastritis, unspecified, without bleeding: Secondary | ICD-10-CM | POA: Diagnosis not present

## 2021-03-07 DIAGNOSIS — K319 Disease of stomach and duodenum, unspecified: Secondary | ICD-10-CM | POA: Diagnosis not present

## 2021-03-07 HISTORY — PX: ESOPHAGOGASTRODUODENOSCOPY (EGD) WITH PROPOFOL: SHX5813

## 2021-03-07 HISTORY — PX: FIDUCIAL MARKER PLACEMENT: SHX6858

## 2021-03-07 HISTORY — PX: BIOPSY: SHX5522

## 2021-03-07 HISTORY — PX: EUS: SHX5427

## 2021-03-07 LAB — GLUCOSE, CAPILLARY
Glucose-Capillary: 93 mg/dL (ref 70–99)
Glucose-Capillary: 106 mg/dL — ABNORMAL HIGH (ref 70–99)

## 2021-03-07 SURGERY — ESOPHAGOGASTRODUODENOSCOPY (EGD) WITH PROPOFOL
Anesthesia: Monitor Anesthesia Care

## 2021-03-07 MED ORDER — DEXMEDETOMIDINE (PRECEDEX) IN NS 20 MCG/5ML (4 MCG/ML) IV SYRINGE
PREFILLED_SYRINGE | INTRAVENOUS | Status: DC | PRN
  Administered 2021-03-07 (×2): 8 ug via INTRAVENOUS

## 2021-03-07 MED ORDER — GLYCOPYRROLATE PF 0.2 MG/ML IJ SOSY
PREFILLED_SYRINGE | INTRAMUSCULAR | Status: DC | PRN
  Administered 2021-03-07: .1 mg via INTRAVENOUS

## 2021-03-07 MED ORDER — PROPOFOL 500 MG/50ML IV EMUL
INTRAVENOUS | Status: DC | PRN
  Administered 2021-03-07: 150 ug/kg/min via INTRAVENOUS

## 2021-03-07 MED ORDER — SODIUM CHLORIDE 0.9 % IV SOLN: INTRAVENOUS | Status: DC

## 2021-03-07 MED ORDER — PROPOFOL 10 MG/ML IV BOLUS
INTRAVENOUS | Status: DC | PRN
Start: 2021-03-07 — End: 2021-03-07
  Administered 2021-03-07: 25 mg via INTRAVENOUS
  Administered 2021-03-07: 20 mg via INTRAVENOUS

## 2021-03-07 MED ORDER — LIDOCAINE 2% (20 MG/ML) 5 ML SYRINGE
INTRAMUSCULAR | Status: DC | PRN
  Administered 2021-03-07: 40 mg via INTRAVENOUS

## 2021-03-07 MED ORDER — BUTAMBEN-TETRACAINE-BENZOCAINE 2-2-14 % EX AERO
INHALATION_SPRAY | CUTANEOUS | Status: DC | PRN
  Administered 2021-03-07: 1 via TOPICAL

## 2021-03-07 MED ORDER — LACTATED RINGERS IV SOLN: INTRAVENOUS | Status: DC

## 2021-03-07 SURGICAL SUPPLY — 16 items
BLOCK BITE 60FR ADLT L/F BLUE (MISCELLANEOUS) ×3 IMPLANT
ELECT REM PT RETURN 9FT ADLT (ELECTROSURGICAL)
ELECTRODE REM PT RTRN 9FT ADLT (ELECTROSURGICAL) IMPLANT
FORCEP RJ3 GP 1.8X160 W-NEEDLE (CUTTING FORCEPS) IMPLANT
FORCEPS BIOP RAD 4 LRG CAP 4 (CUTTING FORCEPS) IMPLANT
NDL SCLEROTHERAPY 25GX240 (NEEDLE) IMPLANT
NEEDLE SCLEROTHERAPY 25GX240 (NEEDLE) IMPLANT
PROBE APC STR FIRE (PROBE) IMPLANT
PROBE INJECTION GOLD (MISCELLANEOUS)
PROBE INJECTION GOLD 7FR (MISCELLANEOUS) IMPLANT
SNARE SHORT THROW 13M SML OVAL (MISCELLANEOUS) IMPLANT
SYR 50ML LL SCALE MARK (SYRINGE) IMPLANT
TUBING ENDO SMARTCAP PENTAX (MISCELLANEOUS) ×6 IMPLANT
TUBING IRRIGATION ENDOGATOR (MISCELLANEOUS) ×3 IMPLANT
WATER STERILE IRR 1000ML POUR (IV SOLUTION) IMPLANT
beacon fiducial ×4 IMPLANT

## 2021-03-07 NOTE — Transfer of Care (Signed)
Immediate Anesthesia Transfer of Care Note  Patient: Nathan Mora  Procedure(s) Performed: ESOPHAGOGASTRODUODENOSCOPY (EGD) WITH PROPOFOL UPPER ENDOSCOPIC ULTRASOUND (EUS) RADIAL FIDUCIAL MARKER PLACEMENT BIOPSY  Patient Location: PACU  Anesthesia Type:MAC  Level of Consciousness: drowsy and patient cooperative  Airway & Oxygen Therapy: Patient Spontanous Breathing and Patient connected to nasal cannula oxygen  Post-op Assessment: Report given to RN and Post -op Vital signs reviewed and stable  Post vital signs: Reviewed and stable  Last Vitals:  Vitals Value Taken Time  BP 102/71 03/07/21 1302  Temp    Pulse 66 03/07/21 1304  Resp 13 03/07/21 1304  SpO2 99 % 03/07/21 1304  Vitals shown include unvalidated device data.  Last Pain:  Vitals:   03/07/21 0949  PainSc: 0-No pain         Complications: No notable events documented.

## 2021-03-07 NOTE — H&P (Signed)
GASTROENTEROLOGY PROCEDURE H&P NOTE   Primary Care Physician: [name], MD  HPI: Nathan Mora is a 76 y.o. male who presents for EUS for fiducial placement in setting of pancreatic cancer preparing for SBRT.  Past Medical History:  Diagnosis Date   Cancer ([place])    Diabetes mellitus without complication ([place])    Glaucoma    left eye   Hyperlipidemia    Lipid disorder 03/29/2012   Past Surgical History:  Procedure Laterality Date   BILIARY BRUSHING  04/11/2020   Procedure: BILIARY BRUSHING;  Surgeon: Irving Copas., MD;  Location: [facility];  Service: Gastroenterology;;   BILIARY DILATION  10/23/2020   Procedure: BILIARY DILATION;  Surgeon: Irving Copas., MD;  Location: [facility];  Service: Gastroenterology;;   BILIARY STENT PLACEMENT N/A 04/11/2020   Procedure: BILIARY STENT PLACEMENT;  Surgeon: Irving Copas., MD;  Location: [facility];  Service: Gastroenterology;  Laterality: N/A;   BILIARY STENT PLACEMENT N/A 10/23/2020   Procedure: BILIARY STENT PLACEMENT;  Surgeon: Rush Landmark Telford Nab., MD;  Location: [facility];  Service: Gastroenterology;  Laterality: N/A;   BIOPSY  04/11/2020   Procedure: BIOPSY;  Surgeon: Rush Landmark Telford Nab., MD;  Location: [facility];  Service: Gastroenterology;;   ENDOSCOPIC RETROGRADE CHOLANGIOPANCREATOGRAPHY (ERCP) WITH PROPOFOL N/A 04/11/2020   Procedure: ENDOSCOPIC RETROGRADE CHOLANGIOPANCREATOGRAPHY (ERCP) WITH PROPOFOL;  Surgeon: Irving Copas., MD;  Location: [facility];  Service: Gastroenterology;  Laterality: N/A;   ENDOSCOPIC RETROGRADE CHOLANGIOPANCREATOGRAPHY (ERCP) WITH PROPOFOL N/A 10/23/2020   Procedure: ENDOSCOPIC RETROGRADE CHOLANGIOPANCREATOGRAPHY (ERCP) WITH PROPOFOL;  Surgeon: Rush Landmark Telford Nab., MD;  Location: [facility];  Service: Gastroenterology;  Laterality: N/A;   ESOPHAGOGASTRODUODENOSCOPY (EGD) WITH PROPOFOL N/A 04/11/2020   Procedure:  ESOPHAGOGASTRODUODENOSCOPY (EGD) WITH PROPOFOL;  Surgeon: Rush Landmark Telford Nab., MD;  Location: [facility];  Service: Gastroenterology;  Laterality: N/A;   EUS N/A 04/11/2020   Procedure: UPPER ENDOSCOPIC ULTRASOUND (EUS) RADIAL;  Surgeon: Irving Copas., MD;  Location: [facility];  Service: Gastroenterology;  Laterality: N/A;   FINE NEEDLE ASPIRATION  04/11/2020   Procedure: FINE NEEDLE ASPIRATION (FNA) LINEAR;  Surgeon: Rush Landmark Telford Nab., MD;  Location: [facility];  Service: Gastroenterology;;   IR IMAGING GUIDED PORT INSERTION  05/04/2020   PANCREATIC STENT PLACEMENT  04/11/2020   Procedure: PANCREATIC STENT PLACEMENT;  Surgeon: Irving Copas., MD;  Location: [facility];  Service: Gastroenterology;;   REMOVAL OF STONES  10/23/2020   Procedure: REMOVAL OF STONES;  Surgeon: Irving Copas., MD;  Location: [facility];  Service: Gastroenterology;;   [name]  04/11/2020   Procedure: [name];  Surgeon: Mansouraty, Telford Nab., MD;  Location: [facility];  Service: Gastroenterology;;   Current Facility-Administered Medications  Medication Dose Route Frequency Provider Last Rate Last Admin   0.9 %  sodium chloride infusion   Intravenous Continuous Mansouraty, Telford Nab., MD       lactated ringers infusion   Intravenous Continuous Mansouraty, Telford Nab., MD 125 mL/hr at 03/07/21 1001 New Bag at 03/07/21 1001    Current Facility-Administered Medications:    0.9 %  sodium chloride infusion, , Intravenous, Continuous, Mansouraty, Telford Nab., MD   lactated ringers infusion, , Intravenous, Continuous, Mansouraty, Telford Nab., MD, Last Rate: 125 mL/hr at 03/07/21 1001, New Bag at 03/07/21 1001 No Known Allergies Family History  Problem Relation Age of Onset   Lung cancer Father    Heart attack Brother    Hypertension Brother    Kidney cancer Brother 70   Hypertension Brother  Social History   Socioeconomic History   Marital status:  Married    Spouse name: Not on file   Number of children: Not on file   Years of education: Not on file   Highest education level: Not on file  Occupational History   Not on file  Tobacco Use   Smoking status: Never   Smokeless tobacco: Never  Substance and Sexual Activity   Alcohol use: Yes    Alcohol/week: 4.0 standard drinks    Types: 4 Cans of beer per week   Drug use: No   Sexual activity: Yes  Other Topics Concern   Not on file  Social History Narrative   Not on file   Social Determinants of Health   Financial Resource Strain: Low Risk    Difficulty of Paying Living Expenses: Not hard at all  Food Insecurity: No Food Insecurity   Worried About Charity fundraiser in the Last Year: Never true   [place] in the Last Year: Never true  Transportation Needs: No Transportation Needs   Lack of Transportation (Medical): No   Lack of Transportation (Non-Medical): No  Physical Activity: Sufficiently Active   Days of Exercise per Week: 3 days   Minutes of Exercise per Session: 50 min  Stress: No Stress Concern Present   Feeling of Stress : Not at all  Social Connections: Socially Integrated   Frequency of Communication with Friends and Family: Three times a week   Frequency of Social Gatherings with Friends and Family: Three times a week   Attends Religious Services: More than 4 times per year   Active Member of Clubs or Organizations: Yes   Attends Music therapist: More than 4 times per year   Marital Status: Married  Human resources officer Violence: Not At Risk   Fear of Current or Ex-Partner: No   Emotionally Abused: No   Physically Abused: No   Sexually Abused: No    Physical Exam: Today's Vitals   03/07/21 0949  BP: 122/76  Pulse: 72  Resp: 13  SpO2: 99%  Weight: 78 kg  Height: 5\' 8"  (1.727 m)  PainSc: 0-No pain   Body mass index is 26.15 kg/m. GEN: NAD EYE: Sclerae anicteric ENT: MMM CV: Non-tachycardic GI: Soft, NT/ND NEURO:  Alert  & Oriented x 3  Lab Results: No results for input(s): WBC, HGB, HCT, PLT in the last 72 hours. BMET No results for input(s): NA, K, CL, CO2, GLUCOSE, BUN, CREATININE, CALCIUM in the last 72 hours. LFT No results for input(s): PROT, ALBUMIN, AST, ALT, ALKPHOS, BILITOT, BILIDIR, IBILI in the last 72 hours. PT/INR No results for input(s): LABPROT, INR in the last 72 hours.   Impression / Plan: This is a 76 y.o.male who presents for EUS for fiducial placement in setting of pancreatic cancer preparing for SBRT.  The risks of an EUS including intestinal perforation, bleeding, infection, aspiration, and medication effects were discussed as was the possibility it may not give a definitive diagnosis if a biopsy is performed.  When a biopsy of the pancreas is done as part of the EUS, there is an additional risk of pancreatitis at the rate of about 1-2%.  It was explained that procedure related pancreatitis is typically mild, although it can be severe and even life threatening, which is why we do not perform random pancreatic biopsies and only biopsy a lesion/area we feel is concerning enough to warrant the risk.   The risks and benefits of  endoscopic evaluation/treatment were discussed with the patient and/or family; these include but are not limited to the risk of perforation, infection, bleeding, missed lesions, lack of diagnosis, severe illness requiring hospitalization, as well as anesthesia and sedation related illnesses.  The patient's history has been reviewed, patient examined, no change in status, and deemed stable for procedure.  The patient and/or family is agreeable to proceed.    Justice Britain, MD [facility] Office # [phone]

## 2021-03-07 NOTE — Anesthesia Procedure Notes (Signed)
Procedure Name: MAC Date/Time: 03/07/2021 12:23 PM Performed by: Gwyndolyn Saxon, CRNA Pre-anesthesia Checklist: Patient identified, Emergency Drugs available, Suction available and Patient being monitored Patient Re-evaluated:Patient Re-evaluated prior to induction Oxygen Delivery Method: Nasal cannula Induction Type: IV induction Placement Confirmation: positive ETCO2 Dental Injury: Teeth and Oropharynx as per pre-operative assessment

## 2021-03-07 NOTE — Op Note (Signed)
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 Michaelene Song, [place] [name],                            MD, Shona Simpson Medicines:                Monitored Anesthesia Care Complications:            No immediate complications. Estimated Blood Loss:     Estimated blood loss was minimal. Procedure:                Pre-Anesthesia Assessment:                           - Prior to the procedure, a History and Physical                            was performed, and patient medications and                            allergies were reviewed. The patient's tolerance of                            previous anesthesia was also reviewed. The risks                            and benefits of the procedure and the sedation                            options and risks were discussed with the patient.                            All questions were answered, and informed consent                            was obtained. Prior Anticoagulants: The patient has                            taken no previous anticoagulant or antiplatelet  agents. ASA Grade Assessment: III - A patient with                            severe systemic disease. After reviewing the risks                            and benefits, the patient was deemed in                            satisfactory condition to undergo the procedure.                           After obtaining informed consent, the endoscope was                             passed under direct vision. Throughout the                            procedure, the patient's blood pressure, pulse, and                            oxygen saturations were monitored continuously. The                            [id number]-[id number] ([id number]) Olympus endoscope was introduced                            through the mouth, and advanced to the second part                            of duodenum. The [id number]-[id number] ([id number]) Olympus                            duodenoscope was introduced through the mouth, and                            advanced to the area of papilla. The [id number]                            ([id number]) Olympus linear ultrasound scope was                            introduced through the mouth, and advanced to the                            duodenum for ultrasound examination from the                            stomach and duodenum. The upper EUS was                            accomplished without difficulty. The patient  tolerated the procedure. Scope In: Scope Out: Findings:      ENDOSCOPIC FINDING: :      No gross lesions were noted in the entire esophagus.      The Z-line was irregular and was found 39 cm from the incisors.      Patchy moderately erythematous mucosa without bleeding was found in the       entire examined stomach. Biopsies were taken with a cold forceps for       histology and Helicobacter pylori testing.      No gross lesions were noted in the duodenal bulb.      A previously placed metal biliary stent was seen at the major papilla.      ENDOSONOGRAPHIC FINDING: :      One stent was visualized endosonographically in the common bile duct.       Extension of the stent was noted in the common bile duct.      An irregular mass was identified in the pancreatic head. The mass was       hypoechoic. The mass measured 36 mm by 32 mm in maximal cross-sectional       diameter. The endosonographic borders were poorly-defined. The  remainder       of the pancreas was examined. The endosonographic appearance of       parenchyma and the upstream pancreatic duct indicated duct dilation and       parenchymal atrophy. Fiducial marker placement was performed. Once the       target lesion in the [facility] was identified a preloaded marker in a       Medtronic 22 gauge needle was then deployed in and around the lesion. We       had to go through the biliary stent to get the fiducials placed and this       made placement more difficult that anticipated. This was repeated for a       total of four markers. Impression:               EGD Impression:                           - No gross lesions in esophagus. Z-line irregular,                            39 cm from the incisors.                           - Erythematous mucosa in the stomach. Biopsied.                           - No gross lesions in the duodenal bulb.                           - Metal biliary stent at ampulla.                           EUS Impression:                           - One stent was visualized endosonographically in  the common bile duct and it traverses through the                            pancreatic mass and with the shadowing decreases                            ability to see entire pancreas.                           - The pancreatic cancer was identified in the                            pancreatic head. A tissue diagnosis was obtained                            prior to this exam. This is consistent with                            adenocarcinoma. Fiducial marker placement was                            performed with 4 markers being placed. Not the                            easiest to place due to the need for going through                            the biliary stent to then place these in the                            inferior aspect of the mass but this was able to be                            performed. Recommendation:            - The patient will be observed post-procedure,                            until all discharge criteria are met.                           - Discharge patient to home.                           - Patient has a contact number available for                            emergencies. The signs and symptoms of potential                            delayed complications were discussed with the                            patient. Return to normal activities  tomorrow.                            Written discharge instructions were provided to the                            patient.                           - Low fat diet.                           - Await path results.                           - Observe patient's clinical course.                           - Monitor for signs/symptoms of bleeding,                            perforation, and infection. If issues please call                            our number to get further assistance as needed.                           - Proceed with upcoming simulation in attempt of                            SBRT for pancreatic cancer treatment.                           - The findings and recommendations were discussed                            with the patient.                           - The findings and recommendations were discussed                            with the patient's family. Procedure Code(s):        --- Professional ---                           [phone], Esophagogastroduodenoscopy, flexible,                            transoral; with transendoscopic ultrasound-guided                            transmural injection of diagnostic or therapeutic                            substance(s) (eg, anesthetic, neurolytic agent) or  fiducial marker(s) (includes endoscopic ultrasound                            examination of the esophagus, stomach, and either                            the duodenum or a surgically altered  stomach where                            the jejunum is examined distal to the anastomosis) Diagnosis Code(s):        --- Professional ---                           K22.8, Other specified diseases of esophagus                           K31.89, Other diseases of stomach and duodenum                           K86.89, Other specified diseases of pancreas                           C25.9, Malignant neoplasm of pancreas, unspecified CPT copyright 2019 American Medical Association. All rights reserved. The codes documented in this report are preliminary and upon coder review may  be revised to meet current compliance requirements. Justice Britain, MD 03/07/2021 1:02:51 PM Number of Addenda: 0

## 2021-03-07 NOTE — Anesthesia Preprocedure Evaluation (Addendum)
Anesthesia Evaluation  Patient identified by MRN, date of birth, ID band Patient awake    Reviewed: Allergy & Precautions, H&P , NPO status , Patient's Chart, lab work & pertinent test results  Airway Mallampati: II  TM Distance: >3 FB Neck ROM: Full    Dental no notable dental hx. (+) Dental Advisory Given, Teeth Intact   Pulmonary neg pulmonary ROS,    Pulmonary exam normal breath sounds clear to auscultation       Cardiovascular negative cardio ROS Normal cardiovascular exam Rhythm:Regular Rate:Normal     Neuro/Psych negative neurological ROS  negative psych ROS   GI/Hepatic negative GI ROS, Neg liver ROS,   Endo/Other  diabetes, Insulin DependentPancreatic CA  Renal/GU negative Renal ROS     Musculoskeletal   Abdominal   Peds  Hematology negative hematology ROS (+)   Anesthesia Other Findings   Reproductive/Obstetrics                            Anesthesia Physical  Anesthesia Plan  ASA: 3  Anesthesia Plan: MAC   Post-op Pain Management:    Induction: Intravenous  PONV Risk Score and Plan: 3 and Ondansetron, Propofol infusion and Treatment may vary due to age or medical condition  Airway Management Planned: Natural Airway  Additional Equipment:   Intra-op Plan:   Post-operative Plan:   Informed Consent: I have reviewed the patients History and Physical, chart, labs and discussed the procedure including the risks, benefits and alternatives for the proposed anesthesia with the patient or authorized representative who has indicated his/her understanding and acceptance.     Dental advisory given  Plan Discussed with: CRNA  Anesthesia Plan Comments:      Anesthesia Quick Evaluation

## 2021-03-08 LAB — SURGICAL PATHOLOGY

## 2021-03-08 NOTE — Anesthesia Postprocedure Evaluation (Signed)
Anesthesia Post Note  Patient: Nathan Mora  Procedure(s) Performed: ESOPHAGOGASTRODUODENOSCOPY (EGD) WITH PROPOFOL UPPER ENDOSCOPIC ULTRASOUND (EUS) RADIAL FIDUCIAL MARKER PLACEMENT BIOPSY     Patient location during evaluation: PACU Anesthesia Type: MAC Level of consciousness: awake and alert Pain management: pain level controlled Vital Signs Assessment: post-procedure vital signs reviewed and stable Respiratory status: spontaneous breathing Cardiovascular status: stable Anesthetic complications: no   No notable events documented.  Last Vitals:  Vitals:   03/07/21 1316 03/07/21 1331  BP: 99/70 105/70  Pulse: 63 (!) 59  Resp: (!) 23 18  Temp:    SpO2: 99% 97%    Last Pain:  Vitals:   03/07/21 1302  PainSc: Asleep                 Nolon Nations

## 2021-03-09 ENCOUNTER — Encounter (HOSPITAL_COMMUNITY): Payer: Self-pay | Admitting: Gastroenterology

## 2021-03-10 ENCOUNTER — Encounter: Payer: Self-pay | Admitting: Gastroenterology

## 2021-03-12 ENCOUNTER — Other Ambulatory Visit: Payer: Self-pay

## 2021-03-12 ENCOUNTER — Ambulatory Visit
Admission: RE | Admit: 2021-03-12 | Discharge: 2021-03-12 | Disposition: A | Discharge status: Home / Self Care | Payer: Medicare HMO | Source: Ambulatory Visit | Attending: Radiation Oncology | Admitting: Radiation Oncology

## 2021-03-12 VITALS — BP 123/71 | HR 71 | Resp 16

## 2021-03-12 DIAGNOSIS — C250 Malignant neoplasm of head of pancreas: Secondary | ICD-10-CM | POA: Insufficient documentation

## 2021-03-12 MED ORDER — SODIUM CHLORIDE 0.9% FLUSH
10.0000 mL | Freq: Once | INTRAVENOUS | Status: AC
  Administered 2021-03-12: 10 mL via INTRAVENOUS

## 2021-03-12 MED ORDER — HEPARIN SOD (PORK) LOCK FLUSH 100 UNIT/ML IV SOLN
500.0000 [IU] | Freq: Once | INTRAVENOUS | Status: AC
  Administered 2021-03-12: 500 [IU] via INTRAVENOUS

## 2021-03-12 NOTE — Progress Notes (Signed)
Has armband been applied?    Does patient have an allergy to IV contrast dye?: No   Has patient ever received premedication for IV contrast dye?:  n/a  Does patient take metformin?:   If patient does take metformin when was the last dose:   Date of lab work: 02/22/2021  BUN: 18 CR: 0.6 eGfr: 101  IV site: Right Chest Port  Has IV site been added to flowsheet?    BP 123/71    Pulse 71    Resp 16    SpO2 100%

## 2021-03-17 ENCOUNTER — Encounter: Payer: Self-pay | Admitting: Family Medicine

## 2021-03-19 ENCOUNTER — Ambulatory Visit: Payer: Medicare HMO

## 2021-03-19 ENCOUNTER — Inpatient Hospital Stay: Payer: Medicare HMO

## 2021-03-19 ENCOUNTER — Inpatient Hospital Stay: Payer: Medicare HMO | Admitting: Oncology

## 2021-03-19 ENCOUNTER — Ambulatory Visit (HOSPITAL_COMMUNITY)
Admission: RE | Admit: 2021-03-19 | Discharge: 2021-03-19 | Disposition: A | Discharge status: Home / Self Care | Payer: Medicare HMO | Source: Ambulatory Visit | Attending: Radiation Oncology | Admitting: Radiation Oncology

## 2021-03-19 ENCOUNTER — Other Ambulatory Visit: Payer: Self-pay

## 2021-03-19 ENCOUNTER — Telehealth: Payer: Self-pay | Admitting: Radiation Oncology

## 2021-03-19 ENCOUNTER — Other Ambulatory Visit: Payer: Self-pay | Admitting: Radiation Oncology

## 2021-03-19 ENCOUNTER — Telehealth: Payer: Self-pay | Admitting: Gastroenterology

## 2021-03-19 DIAGNOSIS — C250 Malignant neoplasm of head of pancreas: Secondary | ICD-10-CM | POA: Diagnosis not present

## 2021-03-19 MED ORDER — OMEPRAZOLE 40 MG PO CPDR: 40.0000 mg | DELAYED_RELEASE_CAPSULE | Freq: Two times a day (BID) | ORAL | 3 refills | Status: AC

## 2021-03-19 NOTE — Telephone Encounter (Signed)
Left message on machine to call back  

## 2021-03-19 NOTE — Progress Notes (Signed)
Left lower extremity venous duplex has been completed. Preliminary results can be found in CV Proc through chart review.  Results were faxed to Worthy Flank PA.  03/19/21 2:39 PM Carlos Levering RVT

## 2021-03-19 NOTE — Telephone Encounter (Signed)
The pt has been advised that the prescription for prilosec has been sent to his pharmacy for 40 mg BID.  ( Dr Rush Landmark advised BID after last procedure.)

## 2021-03-19 NOTE — Telephone Encounter (Signed)
I called the patient to let him know his doppler ultrasound of the LLE was negative for DVT. I had to leave a voicemail, but if he continues to have swelling, I encouraged him to follow up with Dr. Benay Spice or PCP.

## 2021-03-19 NOTE — Telephone Encounter (Signed)
Inbound call from patient wife, seeking advice about his Prilosec medication. Requesting a call back, please advise.

## 2021-03-19 NOTE — Progress Notes (Signed)
Pt's wife came by clinic to let us know that his LLE was swollen. Not hot to touch or painful but assymmetrically swollen. His neighbor, Dr. Rush Landmark recommended he get evaluated. He is not in clinic here, but we will place orders for stat doppler ultrasound to rule out DVT. If still swollen and negative doppler, would defer additional treatment to Dr. Benay Spice.

## 2021-03-20 ENCOUNTER — Ambulatory Visit
Admission: RE | Admit: 2021-03-20 | Discharge: 2021-03-20 | Disposition: A | Discharge status: Home / Self Care | Payer: Medicare HMO | Source: Ambulatory Visit | Attending: Radiation Oncology | Admitting: Radiation Oncology

## 2021-03-20 DIAGNOSIS — C250 Malignant neoplasm of head of pancreas: Secondary | ICD-10-CM | POA: Diagnosis not present

## 2021-03-21 ENCOUNTER — Ambulatory Visit: Payer: Medicare HMO

## 2021-03-21 ENCOUNTER — Ambulatory Visit: Payer: Medicare HMO | Admitting: Family Medicine

## 2021-03-22 ENCOUNTER — Other Ambulatory Visit (HOSPITAL_BASED_OUTPATIENT_CLINIC_OR_DEPARTMENT_OTHER): Payer: Self-pay

## 2021-03-22 ENCOUNTER — Ambulatory Visit
Admission: RE | Admit: 2021-03-22 | Discharge: 2021-03-22 | Disposition: A | Discharge status: Home / Self Care | Payer: Medicare HMO | Source: Ambulatory Visit | Attending: Radiation Oncology | Admitting: Radiation Oncology

## 2021-03-22 ENCOUNTER — Inpatient Hospital Stay: Payer: Medicare HMO | Admitting: Nurse Practitioner

## 2021-03-22 ENCOUNTER — Other Ambulatory Visit: Payer: Self-pay

## 2021-03-22 ENCOUNTER — Inpatient Hospital Stay: Payer: Medicare HMO

## 2021-03-22 ENCOUNTER — Inpatient Hospital Stay: Payer: Medicare HMO | Attending: Oncology

## 2021-03-22 ENCOUNTER — Telehealth: Payer: Self-pay

## 2021-03-22 ENCOUNTER — Ambulatory Visit (INDEPENDENT_AMBULATORY_CARE_PROVIDER_SITE_OTHER): Payer: Medicare HMO

## 2021-03-22 ENCOUNTER — Encounter: Payer: Self-pay | Admitting: Nurse Practitioner

## 2021-03-22 VITALS — BP 111/82 | HR 94 | Temp 97.8°F | Resp 18 | Ht 68.0 in | Wt 169.8 lb

## 2021-03-22 DIAGNOSIS — E785 Hyperlipidemia, unspecified: Secondary | ICD-10-CM | POA: Diagnosis not present

## 2021-03-22 DIAGNOSIS — R972 Elevated prostate specific antigen [PSA]: Secondary | ICD-10-CM

## 2021-03-22 DIAGNOSIS — C250 Malignant neoplasm of head of pancreas: Secondary | ICD-10-CM

## 2021-03-22 DIAGNOSIS — E119 Type 2 diabetes mellitus without complications: Secondary | ICD-10-CM | POA: Diagnosis not present

## 2021-03-22 DIAGNOSIS — M79662 Pain in left lower leg: Secondary | ICD-10-CM | POA: Insufficient documentation

## 2021-03-22 DIAGNOSIS — M7989 Other specified soft tissue disorders: Secondary | ICD-10-CM | POA: Diagnosis not present

## 2021-03-22 DIAGNOSIS — K831 Obstruction of bile duct: Secondary | ICD-10-CM | POA: Diagnosis not present

## 2021-03-22 DIAGNOSIS — Z1379 Encounter for other screening for genetic and chromosomal anomalies: Secondary | ICD-10-CM

## 2021-03-22 DIAGNOSIS — H409 Unspecified glaucoma: Secondary | ICD-10-CM | POA: Diagnosis not present

## 2021-03-22 DIAGNOSIS — D696 Thrombocytopenia, unspecified: Secondary | ICD-10-CM | POA: Diagnosis not present

## 2021-03-22 LAB — CBC WITH DIFFERENTIAL (CANCER CENTER ONLY)
Abs Immature Granulocytes: 0.03 10*3/uL (ref 0.00–0.07)
Basophils Absolute: 0 10*3/uL (ref 0.0–0.1)
Basophils Relative: 0 %
Eosinophils Absolute: 0 10*3/uL (ref 0.0–0.5)
Eosinophils Relative: 0 %
HCT: 34.6 % — ABNORMAL LOW (ref 39.0–52.0)
Hemoglobin: 11.4 g/dL — ABNORMAL LOW (ref 13.0–17.0)
Immature Granulocytes: 1 %
Lymphocytes Relative: 37 %
Lymphs Abs: 1.8 10*3/uL (ref 0.7–4.0)
MCH: 31 pg (ref 26.0–34.0)
MCHC: 32.9 g/dL (ref 30.0–36.0)
MCV: 94 fL (ref 80.0–100.0)
Monocytes Absolute: 0.8 10*3/uL (ref 0.1–1.0)
Monocytes Relative: 17 %
Neutro Abs: 2.1 10*3/uL (ref 1.7–7.7)
Neutrophils Relative %: 45 %
Platelet Count: 79 10*3/uL — ABNORMAL LOW (ref 150–400)
RBC: 3.68 MIL/uL — ABNORMAL LOW (ref 4.22–5.81)
RDW: 12.5 % (ref 11.5–15.5)
WBC Count: 4.7 10*3/uL (ref 4.0–10.5)
nRBC: 0 % (ref 0.0–0.2)

## 2021-03-22 LAB — CMP (CANCER CENTER ONLY)
ALT: 20 U/L (ref 0–44)
AST: 17 U/L (ref 15–41)
Albumin: 4.2 g/dL (ref 3.5–5.0)
Alkaline Phosphatase: 85 U/L (ref 38–126)
Anion gap: 7 (ref 5–15)
BUN: 14 mg/dL (ref 8–23)
CO2: 27 mmol/L (ref 22–32)
Calcium: 8.7 mg/dL — ABNORMAL LOW (ref 8.9–10.3)
Chloride: 104 mmol/L (ref 98–111)
Creatinine: 0.69 mg/dL (ref 0.61–1.24)
GFR, Estimated: >60 mL/min (ref >60)
Glucose, Bld: 200 mg/dL — ABNORMAL HIGH (ref 70–99)
Potassium: 3.8 mmol/L (ref 3.5–5.1)
Sodium: 138 mmol/L (ref 135–145)
Total Bilirubin: 0.8 mg/dL (ref 0.3–1.2)
Total Protein: 5.6 g/dL — ABNORMAL LOW (ref 6.5–8.1)

## 2021-03-22 MED ORDER — ALTEPLASE 2 MG IJ SOLR: 2.0000 mg | Freq: Once | INTRAMUSCULAR | Status: DC | PRN

## 2021-03-22 MED ORDER — HEPARIN SOD (PORK) LOCK FLUSH 100 UNIT/ML IV SOLN
500.0000 [IU] | Freq: Once | INTRAVENOUS | Status: AC | PRN
  Administered 2021-03-22: 500 [IU]

## 2021-03-22 MED ORDER — SODIUM CHLORIDE 0.9% FLUSH
10.0000 mL | INTRAVENOUS | Status: DC | PRN
  Administered 2021-03-22: 10 mL

## 2021-03-22 NOTE — Patient Instructions (Addendum)

## 2021-03-22 NOTE — Telephone Encounter (Signed)
Called and spoke with the patient to let him know his CT scan schedule on 03/23/21 at [address]. Patient voiced understanding to be here at 230, NPO 4 hour prior appointment.

## 2021-03-22 NOTE — Progress Notes (Signed)
[place] OFFICE PROGRESS NOTE   Diagnosis: Pancreas cancer  INTERVAL HISTORY:   [name] returns as scheduled.  He began pancreas SBRT 03/20/2021.  He is fatigued.  He has had trouble sleeping the past few nights.  He has had swelling of the left leg for about the past week.  Left lower extremity venous Doppler on 03/19/2021 was negative for DVT.  He continues to note swelling and also left calf tenderness.  Objective:  Vital signs in last 24 hours:  Blood pressure 111/82, pulse 94, temperature 97.8 F (36.6 C), temperature source Oral, resp. rate 18, height '5\' 8"'  (1.727 m), weight 169 lb 12.8 oz (77 kg), SpO2 100 %.    Resp: Lungs clear bilaterally. Cardio: Regular rate and rhythm. GI: Abdomen soft and nontender.  No hepatosplenomegaly. Vascular: Trace edema throughout the left leg.  Left calf is tender. Port-A-Cath without erythema.  Lab Results:  Lab Results  Component Value Date   WBC 14.7 (H) 01/31/2021   HGB 11.2 (L) 01/31/2021   HCT 34.4 (L) 01/31/2021   MCV 95.0 01/31/2021   PLT 84 (L) 01/31/2021   NEUTROABS 9.5 (H) 01/31/2021    Imaging:  No results found.  Medications: I have reviewed the patient's current medications.  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  Fat planes around the celiac axis preserved.  Less than 90 degree contact of the lateral wall  of the SMA, no evidence of metastatic disease, 3 mm left lower lobe pulmonary nodule Cycle 1 FOLFOX 05/09/2020 (plan to add irinotecan with cycle 2 pending tolerance) Cycle 2 FOLFIRINOX 05/23/2020 Cycle 3 FOLFIRINOX 06/06/2020, oxaliplatin dose reduced secondary to thrombocytopenia Cycle 4 FOLFIRINOX 06/27/2020, oxaliplatin further dose reduced secondary to thrombocytopenia CTs at [facility] 07/03/2020-redemonstration of a heterogeneous pancreatic head mass with borderline encasement of the SMA as well as abutment of the portal vein, SMV and likely the IVC.  Similar mildly enlarged porta hepatis and pericaval lymph nodes.  No convincing evidence of distant metastatic disease. 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  More than 180 degree involvement of the SMV and SMA Cycle 1 gemcitabine/Abraxane 11/22/2020, [place] counselor contacted him 11/26/2020-no pathogenic variants identified in the 84 genes analyzed.  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 Cycle 2 gemcitabine/Abraxane 12/06/2020, Udenyca Cycle 3 gemcitabine/Abraxane 12/19/2020, Udenyca Cycle 4 gemcitabine/Abraxane 01/03/2021, Udenyca Cycle 5 gemcitabine/Abraxane 01/17/2021, Udenyca Cycle 6 gemcitabine/Abraxane 01/31/2021, Udenyca CTs at [facility] 02/22/2021-similar pancreatic head mass with involvement of SMA/SMV.  No evidence of distant metastatic disease. Referred to Dr. Lisbeth Renshaw  02/26/2021  Pancreas SBRT 03/20/2021 Obstructive jaundice secondary #1 ERCP 04/11/2020-prominent and floppy major papilla, dilated bile duct secondary to a distal stricture, placement of an uncovered metal stent, bile duct brushings-reactive glandular cells 3.   Diabetes   4.   Hyperlipidemia   5.   Glaucoma   6.   Port-A-Cath placement 05/04/2020 Interventional Radiology   7.   Mild thrombocytopenia (121,000) 05/09/2020  Disposition: [name] appears stable.  He is completing pancreas SBRT.  At today's visit he reports left leg swelling and calf pain for the past week.  He had a venous Doppler 03/19/2021 which was negative for DVT.  He has persistent edema and pain.  He is at high risk for DVT.  We are referring him for a repeat venous Doppler study today.  If negative the plan is for a pelvic CT.  We scheduled routine follow-up in 4 weeks.  We will follow-up on the study from today.  Patient seen with Dr. Benay Spice.    Donnis Phaneuf ANP/GNP-BC   03/22/2021  10:58 AM  Addendum 1:07 PM-repeat Doppler study negative.  We are referring him for CT to evaluate for pelvic thrombus.  This was a shared visit with [name].  [name].  He has swelling and discomfort in the left leg.  We are concerned he may have a DVT.  A Doppler is negative.  We will refer him for a CT to rule out a more proximal thrombus.  I was present for greater than 50% of today's visit.  I performed medical decision making.  [name], MD

## 2021-03-23 ENCOUNTER — Ambulatory Visit (HOSPITAL_BASED_OUTPATIENT_CLINIC_OR_DEPARTMENT_OTHER)
Admission: RE | Admit: 2021-03-23 | Discharge: 2021-03-23 | Disposition: A | Discharge status: Home / Self Care | Payer: Medicare HMO | Source: Ambulatory Visit | Attending: Nurse Practitioner | Admitting: Nurse Practitioner

## 2021-03-23 DIAGNOSIS — N281 Cyst of kidney, acquired: Secondary | ICD-10-CM | POA: Diagnosis not present

## 2021-03-23 DIAGNOSIS — N200 Calculus of kidney: Secondary | ICD-10-CM | POA: Diagnosis not present

## 2021-03-23 DIAGNOSIS — C250 Malignant neoplasm of head of pancreas: Secondary | ICD-10-CM

## 2021-03-23 LAB — PROSTATE-SPECIFIC AG, SERUM (LABCORP): Prostate Specific Ag, Serum: 7.3 ng/mL — ABNORMAL HIGH (ref 0.0–4.0)

## 2021-03-23 MED ORDER — IOHEXOL 300 MG/ML  SOLN
100.0000 mL | Freq: Once | INTRAMUSCULAR | Status: AC | PRN
  Administered 2021-03-23: 100 mL via INTRAVENOUS

## 2021-03-25 ENCOUNTER — Telehealth: Payer: Self-pay | Admitting: Family Medicine

## 2021-03-25 ENCOUNTER — Other Ambulatory Visit: Payer: Self-pay

## 2021-03-25 ENCOUNTER — Ambulatory Visit
Admission: RE | Admit: 2021-03-25 | Discharge: 2021-03-25 | Disposition: A | Discharge status: Home / Self Care | Payer: Medicare HMO | Source: Ambulatory Visit | Attending: Radiation Oncology | Admitting: Radiation Oncology

## 2021-03-25 DIAGNOSIS — C250 Malignant neoplasm of head of pancreas: Secondary | ICD-10-CM | POA: Diagnosis not present

## 2021-03-25 NOTE — Chronic Care Management (AMB) (Signed)
°  Chronic Care Management   Outreach Note  03/25/2021 Name: SULTAN PARGAS MRN: [id number] DOB: 20-May-1945  Referred by: [name], MD Reason for referral : No chief complaint on file.   An unsuccessful telephone outreach was attempted today. The patient was referred to the pharmacist for assistance with care management and care coordination.   Follow Up Plan:   Tatjana Dellinger Upstream Scheduler

## 2021-03-25 NOTE — Telephone Encounter (Signed)
Patient is calling to give you an update   Had new psa result from 2/24. Also 2 ultra sound on left calf area negative for blood clot. Having ct scan pelvis this afternoon looking for blood clot.Just a heads up.

## 2021-03-26 ENCOUNTER — Ambulatory Visit: Payer: Medicare HMO

## 2021-03-27 ENCOUNTER — Ambulatory Visit
Admission: RE | Admit: 2021-03-27 | Discharge: 2021-03-27 | Disposition: A | Discharge status: Home / Self Care | Payer: Medicare HMO | Source: Ambulatory Visit | Attending: Radiation Oncology | Admitting: Radiation Oncology

## 2021-03-27 ENCOUNTER — Ambulatory Visit (INDEPENDENT_AMBULATORY_CARE_PROVIDER_SITE_OTHER): Payer: Medicare HMO

## 2021-03-27 ENCOUNTER — Encounter: Payer: Self-pay | Admitting: Podiatry

## 2021-03-27 ENCOUNTER — Ambulatory Visit: Payer: Medicare HMO | Admitting: Podiatry

## 2021-03-27 ENCOUNTER — Other Ambulatory Visit: Payer: Self-pay

## 2021-03-27 ENCOUNTER — Ambulatory Visit: Payer: Medicare HMO

## 2021-03-27 DIAGNOSIS — M21622 Bunionette of left foot: Secondary | ICD-10-CM | POA: Diagnosis not present

## 2021-03-27 DIAGNOSIS — M7752 Other enthesopathy of left foot: Secondary | ICD-10-CM | POA: Diagnosis not present

## 2021-03-27 DIAGNOSIS — M79672 Pain in left foot: Secondary | ICD-10-CM

## 2021-03-27 DIAGNOSIS — M779 Enthesopathy, unspecified: Secondary | ICD-10-CM | POA: Diagnosis not present

## 2021-03-27 DIAGNOSIS — C250 Malignant neoplasm of head of pancreas: Secondary | ICD-10-CM | POA: Diagnosis not present

## 2021-03-27 MED ORDER — TRIAMCINOLONE ACETONIDE 10 MG/ML IJ SUSP
10.0000 mg | Freq: Once | INTRAMUSCULAR | Status: AC
  Administered 2021-03-27: 10 mg

## 2021-03-28 ENCOUNTER — Ambulatory Visit: Payer: Medicare HMO

## 2021-03-28 NOTE — Progress Notes (Signed)
Subjective:  ? ?Patient ID: Nathan Mora, male   DOB: 76 y.o.   MRN: [id number]  ? ?HPI ?Patient presents stating is having a lot of pain in the outside of his left foot and it makes it hard for him to walk comfortably and is been there for around 6 months.  He is being treated for pancreatic cancer currently with radiation.  Patient does not smoke likes to be active if possible ? ? ?Review of Systems  ?All other systems reviewed and are negative. ? ? ?   ?Objective:  ?Physical Exam ?Vitals and nursing note reviewed.  ?Constitutional:   ?   Appearance: He is well-developed.  ?Pulmonary:  ?   Effort: Pulmonary effort is normal.  ?Musculoskeletal:     ?   General: Normal range of motion.  ?Skin: ?   General: Skin is warm.  ?Neurological:  ?   Mental Status: He is alert.  ?  ?Neurovascular status intact muscle strength was found to be adequate range of motion was found to be adequate.  Patient does have inflammation pain around the fifth metatarsal head left with fluid buildup slightly into the fifth metatarsal base and does not appear currently to have any increased swelling in his ankles even though he has had history of this.  Good digital perfusion  ? ?   ?Assessment:  ?Inflammatory condition with probability for tailor's bunion deformity left with discomfort along with possible problems associated with treatment for his pancreatic cancer ? ?   ?Plan:  ?H&P x-rays reviewed and at this point I just want to give him relief and I did sterile prep and injected around the fifth MPJ 3 mg dexamethasone Kenalog 5 mg Xylocaine I advised on elevation and compression and patient will be seen back to recheck as needed and may require more treatments but hopefully this will solve the problem ? ?X-rays indicate that there is some irritation around the fifth metatarsal head left but no other pathology noted ?   ? ? ?

## 2021-03-29 ENCOUNTER — Ambulatory Visit: Payer: Medicare HMO

## 2021-03-29 ENCOUNTER — Other Ambulatory Visit: Payer: Self-pay | Admitting: Family Medicine

## 2021-03-29 ENCOUNTER — Other Ambulatory Visit: Payer: Self-pay

## 2021-03-29 ENCOUNTER — Other Ambulatory Visit: Payer: Self-pay | Admitting: Podiatry

## 2021-03-29 ENCOUNTER — Ambulatory Visit
Admission: RE | Admit: 2021-03-29 | Discharge: 2021-03-29 | Disposition: A | Discharge status: Home / Self Care | Payer: Medicare HMO | Source: Ambulatory Visit | Attending: Radiation Oncology | Admitting: Radiation Oncology

## 2021-03-29 ENCOUNTER — Encounter: Payer: Self-pay | Admitting: Radiation Oncology

## 2021-03-29 DIAGNOSIS — C250 Malignant neoplasm of head of pancreas: Secondary | ICD-10-CM | POA: Diagnosis not present

## 2021-03-29 DIAGNOSIS — M779 Enthesopathy, unspecified: Secondary | ICD-10-CM

## 2021-04-01 ENCOUNTER — Telehealth: Payer: Self-pay | Admitting: Family Medicine

## 2021-04-01 ENCOUNTER — Ambulatory Visit: Payer: Medicare HMO

## 2021-04-01 NOTE — Chronic Care Management (AMB) (Signed)
?  Chronic Care Management  ? ?Outreach Note ? ?04/01/2021 ?Name: Nathan Mora MRN: [id number] DOB: [birth year] ? ?Referred by: [name], MD ?Reason for referral : No chief complaint on file. ? ? ?A second unsuccessful telephone outreach was attempted today. The patient was referred to pharmacist for assistance with care management and care coordination. ? ?Follow Up Plan:  ? ?Tatjana Dellinger ?Upstream Scheduler   ?

## 2021-04-03 ENCOUNTER — Ambulatory Visit: Payer: Medicare HMO

## 2021-04-04 ENCOUNTER — Encounter: Payer: Self-pay | Admitting: Oncology

## 2021-04-04 NOTE — Progress Notes (Signed)
? ?                                                                                                                                                          ?  Patient Name: Nathan Mora ?MRN: [id number] ?DOB: 1945/10/28 ?Referring Physician: [name] (Profile Not Attached) ?Date of Service: 03/29/2021 ?[facility] [place] ? ?                                                      End Of Treatment Note ? ?Diagnoses: C25.0-Malignant neoplasm of head of pancreas ? ?Cancer Staging:  Stage IIA, cT3N0M0, Unresectable Adenocarcinoma of the Pancreatic Head ? ?Intent: Curative ? ?Radiation Treatment Dates:  ?03/20/2021 through 03/29/2021  SBRT Treatment ?Site Technique Total Dose (Gy) Dose per Fx (Gy) Completed Fx Beam Energies  ?Pancreas: Pancreas IMRT 33/33 6.6 5/5 6XFFF  ? ?Narrative: The patient tolerated radiation therapy relatively well. He did have some fatigue during treatment and loose stools.  ? ?Plan: The patient will receive a call in about one month from the radiation oncology department. He will continue follow up with Dr. Benay Spice as well.  ? ?________________________________________________ ? ? ? ?Carola Rhine, PAC  ?

## 2021-04-05 ENCOUNTER — Ambulatory Visit: Payer: Medicare HMO

## 2021-04-11 MED ORDER — METFORMIN HCL 500 MG PO TABS: 500.0000 mg | ORAL_TABLET | Freq: Two times a day (BID) | ORAL | 1 refills | Status: DC

## 2021-04-11 NOTE — Telephone Encounter (Signed)
Pt is asking for Metformin 500 mg 2 daily would like rx sent in your name was not previously on this script  ?

## 2021-04-19 ENCOUNTER — Other Ambulatory Visit: Payer: Self-pay | Admitting: *Deleted

## 2021-04-19 ENCOUNTER — Other Ambulatory Visit: Payer: Self-pay

## 2021-04-19 ENCOUNTER — Encounter: Payer: Self-pay | Admitting: Oncology

## 2021-04-19 ENCOUNTER — Inpatient Hospital Stay: Payer: Medicare HMO | Attending: Oncology

## 2021-04-19 ENCOUNTER — Inpatient Hospital Stay (HOSPITAL_BASED_OUTPATIENT_CLINIC_OR_DEPARTMENT_OTHER): Payer: Medicare HMO | Admitting: Oncology

## 2021-04-19 ENCOUNTER — Inpatient Hospital Stay: Payer: Medicare HMO

## 2021-04-19 VITALS — BP 132/79 | HR 79 | Temp 97.8°F | Resp 19 | Ht 68.0 in | Wt 162.4 lb

## 2021-04-19 DIAGNOSIS — C250 Malignant neoplasm of head of pancreas: Secondary | ICD-10-CM | POA: Insufficient documentation

## 2021-04-19 DIAGNOSIS — D696 Thrombocytopenia, unspecified: Secondary | ICD-10-CM | POA: Insufficient documentation

## 2021-04-19 DIAGNOSIS — R634 Abnormal weight loss: Secondary | ICD-10-CM | POA: Insufficient documentation

## 2021-04-19 DIAGNOSIS — Z923 Personal history of irradiation: Secondary | ICD-10-CM | POA: Diagnosis not present

## 2021-04-19 DIAGNOSIS — K831 Obstruction of bile duct: Secondary | ICD-10-CM | POA: Diagnosis not present

## 2021-04-19 DIAGNOSIS — E119 Type 2 diabetes mellitus without complications: Secondary | ICD-10-CM | POA: Diagnosis not present

## 2021-04-19 DIAGNOSIS — E785 Hyperlipidemia, unspecified: Secondary | ICD-10-CM | POA: Insufficient documentation

## 2021-04-19 DIAGNOSIS — H409 Unspecified glaucoma: Secondary | ICD-10-CM | POA: Insufficient documentation

## 2021-04-19 DIAGNOSIS — R197 Diarrhea, unspecified: Secondary | ICD-10-CM | POA: Diagnosis not present

## 2021-04-19 LAB — CMP (CANCER CENTER ONLY)
ALT: 21 U/L (ref 0–44)
AST: 18 U/L (ref 15–41)
Albumin: 4.4 g/dL (ref 3.5–5.0)
Alkaline Phosphatase: 88 U/L (ref 38–126)
Anion gap: 9 (ref 5–15)
BUN: 13 mg/dL (ref 8–23)
CO2: 27 mmol/L (ref 22–32)
Calcium: 9.3 mg/dL (ref 8.9–10.3)
Chloride: 101 mmol/L (ref 98–111)
Creatinine: 0.78 mg/dL (ref 0.61–1.24)
GFR, Estimated: >60 mL/min (ref >60)
Glucose, Bld: 293 mg/dL — ABNORMAL HIGH (ref 70–99)
Potassium: 3.8 mmol/L (ref 3.5–5.1)
Sodium: 137 mmol/L (ref 135–145)
Total Bilirubin: 0.6 mg/dL (ref 0.3–1.2)
Total Protein: 6.5 g/dL (ref 6.5–8.1)

## 2021-04-19 LAB — CBC WITH DIFFERENTIAL (CANCER CENTER ONLY)
Abs Immature Granulocytes: 0.04 10*3/uL (ref 0.00–0.07)
Basophils Absolute: 0 10*3/uL (ref 0.0–0.1)
Basophils Relative: 0 %
Eosinophils Absolute: 0 10*3/uL (ref 0.0–0.5)
Eosinophils Relative: 0 %
HCT: 35.4 % — ABNORMAL LOW (ref 39.0–52.0)
Hemoglobin: 11.8 g/dL — ABNORMAL LOW (ref 13.0–17.0)
Immature Granulocytes: 1 %
Lymphocytes Relative: 34 %
Lymphs Abs: 1.4 10*3/uL (ref 0.7–4.0)
MCH: 29.4 pg (ref 26.0–34.0)
MCHC: 33.3 g/dL (ref 30.0–36.0)
MCV: 88.3 fL (ref 80.0–100.0)
Monocytes Absolute: 0.6 10*3/uL (ref 0.1–1.0)
Monocytes Relative: 15 %
Neutro Abs: 2.1 10*3/uL (ref 1.7–7.7)
Neutrophils Relative %: 50 %
Platelet Count: 86 10*3/uL — ABNORMAL LOW (ref 150–400)
RBC: 4.01 MIL/uL — ABNORMAL LOW (ref 4.22–5.81)
RDW: 12.8 % (ref 11.5–15.5)
WBC Count: 4.2 10*3/uL (ref 4.0–10.5)
nRBC: 0 % (ref 0.0–0.2)

## 2021-04-19 LAB — CEA (ACCESS): CEA (CHCC): 12.49 ng/mL — ABNORMAL HIGH (ref 0.00–5.00)

## 2021-04-19 NOTE — Progress Notes (Signed)
?[place] ?OFFICE PROGRESS NOTE ? ? ?Diagnosis: Pancreas cancer ? ?INTERVAL HISTORY:  ? ?Mr. Griffie returns as scheduled.  He completed pancreas radiation 03/29/2021.  He continues to have intermittent swelling in the left leg.  Dopplers were negative on 03/19/2021 and 03/22/2021. ?He reports increased diarrhea over the past week.  He has lost weight.  The metformin dose was increased approximately 1 month ago.  He is taking Creon. ?He is playing golf. ? ?Objective: ? ?Vital signs in last 24 hours: ? ?Blood pressure 132/79, pulse 79, temperature 97.8 ?F (36.6 ?C), temperature source Oral, resp. rate 19, height _0  (1.727 m), weight 162 lb 6.4 oz (73.7 kg), SpO2 100 %. ?  ?Lymphatics: No cervical or supraclavicular nodes ?Resp: Lungs clear bilaterally ?Cardio: Regular rate and rhythm ?GI: Soft, no apparent ascites, no mass, no hepatosplenomegaly ?Vascular: The left lower leg is slightly larger than the right side, no erythema ? ? ?Portacath/PICC-without erythema ? ?Lab Results: ? ?Lab Results  ?Component Value Date  ? WBC 4.2 04/19/2021  ? HGB 11.8 (L) 04/19/2021  ? HCT 35.4 (L) 04/19/2021  ? MCV 88.3 04/19/2021  ? PLT 86 (L) 04/19/2021  ? NEUTROABS 2.1 04/19/2021  ? ? ?CMP  ?Lab Results  ?Component Value Date  ? NA 138 03/22/2021  ? K 3.8 03/22/2021  ? CL 104 03/22/2021  ? CO2 27 03/22/2021  ? GLUCOSE 200 (H) 03/22/2021  ? BUN 14 03/22/2021  ? CREATININE 0.69 03/22/2021  ? CALCIUM 8.7 (L) 03/22/2021  ? PROT 5.6 (L) 03/22/2021  ? ALBUMIN 4.2 03/22/2021  ? AST 17 03/22/2021  ? ALT 20 03/22/2021  ? ALKPHOS 85 03/22/2021  ? BILITOT 0.8 03/22/2021  ? GFRNONAA >60 03/22/2021  ? GFRAA 99 12/21/2019  ? ? ?Lab Results  ?Component Value Date  ? [id number] 834 (H) 01/31/2021  ? ?Medications: I have reviewed the patient's current medications. ? ? 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 criteria ?MRI/MRCP 04/05/2020-2.6 x 2.2 cm complex cystic/partially solid lesion in the pancreas head with common bile duct and  pancreatic duct obstruction, no evidence for vascular involvement/invasion, no evidence of abdominal metastatic disease ?EUS 04/11/2020-pancreas head mass, 41 x 26 mm with a 13 x 21 mm cystic component, abutment of the portal vein, fine-needle biopsy-adenocarcinoma ?CTs 04/19/2020-heterogenous pancreas head mass measuring 3.3 x 2.9 cm, mass contiguous with the anterior wall the portal splenic confluence with no encasement of the portal vein or superior mesenteric vein.  Fat planes around the celiac axis preserved.  Less than 90 degree contact of the lateral wall of the SMA, no evidence of metastatic disease, 3 mm left lower lobe pulmonary nodule ?Cycle 1 FOLFOX 05/09/2020 (plan to add irinotecan with cycle 2 pending tolerance) ?Cycle 2 FOLFIRINOX 05/23/2020 ?Cycle 3 FOLFIRINOX 06/06/2020, oxaliplatin dose reduced secondary to thrombocytopenia ?Cycle 4 FOLFIRINOX 06/27/2020, oxaliplatin further dose reduced secondary to thrombocytopenia ?CTs at [facility] 07/03/2020-redemonstration of a heterogeneous pancreatic head mass with borderline encasement of the SMA as well as abutment of the portal vein, SMV and likely the IVC.  Similar mildly enlarged porta hepatis and pericaval lymph nodes.  No convincing evidence of distant metastatic disease. 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  Pancreas protocol CT at [facility] 11/12/2020-increase in size of ill-defined pancreas head mass with abutment of the IVC and renal vein.  More than 180 degree involvement of the SMV and SMA ?Cycle 1 gemcitabine/Abraxane 11/22/2020,  Udenyca ?Genetics counselor contacted him 11/26/2020-no pathogenic variants identified in the 84 genes analyzed.  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 ?Cycle 2 gemcitabine/Abraxane 12/06/2020, Udenyca ?Cycle 3 gemcitabine/Abraxane 12/19/2020, Udenyca ?Cycle 4 gemcitabine/Abraxane 01/03/2021, Udenyca ?Cycle 5 gemcitabine/Abraxane 01/17/2021, Udenyca ?Cycle 6 gemcitabine/Abraxane 01/31/2021, Udenyca ?CTs at [facility] 02/22/2021-similar pancreatic head mass with involvement of SMA/SMV.  No evidence of distant metastatic disease. ?Referred to Dr. Lisbeth Renshaw 02/26/2021 ?Pancreas SBRT 03/20/2021-03/29/2021 ?Obstructive jaundice secondary #1 ?ERCP 04/11/2020-prominent and floppy major papilla, dilated bile duct secondary to a distal stricture, placement of an uncovered metal stent, bile duct brushings-reactive glandular cells ?3.   Diabetes ?  ?4.   Hyperlipidemia ?  ?5.   Glaucoma ?  ?6.   Port-A-Cath placement 05/04/2020 Interventional Radiology ?  ?7.   Mild thrombocytopenia (121,000) 05/09/2020 ? ? ? ?Disposition: ?Mr. Higginbotham has a history of locally advanced pancreas cancer.  He completed SBRT to the pancreas earlier this month.  There is no clinical evidence of disease progression.  The recent weight loss may be related to diarrhea.  I recommended he increase nutrition supplements.  He will try Imodium for the diarrhea.  The diarrhea may be related to metformin therapy, radiation, or pancreas insufficiency.  He will contact Dr. [name] to discuss decreasing the metformin dose.  He will follow-up with Dr. Rush Landmark if the diarrhea does not improve. ?He will return for an office visit and CA 19-9 in 1 month. ?[name], MD ? ?04/19/2021  ?10:59 AM ? ? ?

## 2021-04-20 LAB — CANCER ANTIGEN 19-9: CA 19-9: 535 U/mL — ABNORMAL HIGH (ref 0–35)

## 2021-04-23 ENCOUNTER — Telehealth: Payer: Self-pay

## 2021-04-23 NOTE — Telephone Encounter (Signed)
Telephone call to patient to clarify his MyChart message regarding a CEA lab result. Explained to patient to disregard this lab draw. Patient had requested a CA 19.9 but this writer erroneously heard a CEA. Patient verbalized understanding. ?

## 2021-04-29 ENCOUNTER — Ambulatory Visit
Admission: RE | Admit: 2021-04-29 | Discharge: 2021-04-29 | Disposition: A | Discharge status: Home / Self Care | Payer: Medicare HMO | Source: Ambulatory Visit | Attending: Radiation Oncology | Admitting: Radiation Oncology

## 2021-04-29 DIAGNOSIS — C250 Malignant neoplasm of head of pancreas: Secondary | ICD-10-CM | POA: Insufficient documentation

## 2021-05-05 ENCOUNTER — Encounter: Payer: Self-pay | Admitting: Oncology

## 2021-05-06 ENCOUNTER — Telehealth: Payer: Self-pay | Admitting: *Deleted

## 2021-05-06 NOTE — Progress Notes (Signed)
?  Radiation Oncology         [phone] ?________________________________ ? ?Name: Nathan Mora MRN: [id number]  ?Date of Service: 04/29/2021  DOB: 1945-07-19 ? ?Post Treatment Telephone Note ? ?Diagnosis:   Stage IIA, cT3N0M0, Unresectable Adenocarcinoma of the Pancreatic Head ? ?Intent: Curative ? ?Radiation Treatment Dates:  ?03/20/2021 through 03/29/2021  SBRT Treatment ?Site Technique Total Dose (Gy) Dose per Fx (Gy) Completed Fx Beam Energies  ?Pancreas: Pancreas IMRT 33/33 6.6 5/5 6XFFF  ? ?Narrative: The patient tolerated radiation therapy relatively well. He did have some fatigue during treatment and loose stools. He saw Dr. Benay Spice a few weeks ago and diarrhea was still ongoing. He was going to follow up with Dr. Rush Landmark if he was still having symptoms. ? ? ?Impression/Plan: ?1.  Stage IIA, cT3N0M0, Unresectable Adenocarcinoma of the Pancreatic Head.  I was unable to reach the patient but left a voicemail and on the message, I discussed that we would be happy to continue to follow him as needed, but he will also continue to follow up with Dr. Benay Spice in medical oncology.   ? ? ? ?Carola Rhine, PAC  ? ? ?  ?

## 2021-05-06 NOTE — Telephone Encounter (Signed)
Mychart message from patient re: distended stomach. Called and left VM requesting he call to provide more information. ?

## 2021-05-07 ENCOUNTER — Encounter: Payer: Self-pay | Admitting: Oncology

## 2021-05-07 NOTE — Telephone Encounter (Addendum)
Nathan Mora reports the area is where Dr. Benay Spice said he had a hernia developing from his surgery. Has increased in size since RT finished. Having no pain.  ?Per Dr. Benay Spice: No intervention needed at this time. Can wear binder to keep it reduced. F/U with surgeon at next visit in May after his scans. Instructed him to go to ER if he develops severe pain and unable to reduce the hernia. ?

## 2021-05-15 ENCOUNTER — Telehealth: Payer: Self-pay | Admitting: Family Medicine

## 2021-05-15 NOTE — Progress Notes (Signed)
?  Chronic Care Management  ? ?Note ? ?05/15/2021 ?Name: Nathan Mora MRN: [id number] DOB: 09/14/1945 ? ?Nathan Mora is a 76 y.o. year old male who is a primary care patient of [name], MD. I reached out to [name] by phone today in response to a referral sent by Nathan Mora's PCP, [name], MD.  ? ?Nathan Mora was given information about Chronic Care Management services today including:  ?CCM service includes personalized support from designated clinical staff supervised by his physician, including individualized plan of care and coordination with other care providers ?24/7 contact phone numbers for assistance for urgent and routine care needs. ?Service will only be billed when office clinical staff spend 20 minutes or more in a month to coordinate care. ?Only one practitioner may furnish and bill the service in a calendar month. ?The patient may stop CCM services at any time (effective at the end of the month) by phone call to the office staff. ? ? ?Patient agreed to services and verbal consent obtained.  ? ?Follow up plan:NO COPAY ? ? ?Tatjana Dellinger ?Upstream Scheduler  ?

## 2021-05-16 ENCOUNTER — Inpatient Hospital Stay: Payer: Medicare HMO | Attending: Oncology

## 2021-05-16 ENCOUNTER — Inpatient Hospital Stay (HOSPITAL_BASED_OUTPATIENT_CLINIC_OR_DEPARTMENT_OTHER): Payer: Medicare HMO | Admitting: Nurse Practitioner

## 2021-05-16 ENCOUNTER — Encounter: Payer: Self-pay | Admitting: Nurse Practitioner

## 2021-05-16 ENCOUNTER — Inpatient Hospital Stay: Payer: Medicare HMO

## 2021-05-16 VITALS — BP 111/67 | HR 67 | Temp 98.1°F | Resp 18 | Ht 68.0 in | Wt 164.0 lb

## 2021-05-16 DIAGNOSIS — K831 Obstruction of bile duct: Secondary | ICD-10-CM | POA: Insufficient documentation

## 2021-05-16 DIAGNOSIS — D696 Thrombocytopenia, unspecified: Secondary | ICD-10-CM | POA: Insufficient documentation

## 2021-05-16 DIAGNOSIS — C250 Malignant neoplasm of head of pancreas: Secondary | ICD-10-CM | POA: Diagnosis not present

## 2021-05-16 DIAGNOSIS — Z1379 Encounter for other screening for genetic and chromosomal anomalies: Secondary | ICD-10-CM

## 2021-05-16 DIAGNOSIS — E785 Hyperlipidemia, unspecified: Secondary | ICD-10-CM | POA: Insufficient documentation

## 2021-05-16 DIAGNOSIS — E119 Type 2 diabetes mellitus without complications: Secondary | ICD-10-CM | POA: Diagnosis not present

## 2021-05-16 DIAGNOSIS — H409 Unspecified glaucoma: Secondary | ICD-10-CM | POA: Insufficient documentation

## 2021-05-16 DIAGNOSIS — Z923 Personal history of irradiation: Secondary | ICD-10-CM | POA: Diagnosis not present

## 2021-05-16 DIAGNOSIS — R197 Diarrhea, unspecified: Secondary | ICD-10-CM | POA: Insufficient documentation

## 2021-05-16 LAB — CMP (CANCER CENTER ONLY)
ALT: 22 U/L (ref 0–44)
AST: 17 U/L (ref 15–41)
Albumin: 4.3 g/dL (ref 3.5–5.0)
Alkaline Phosphatase: 102 U/L (ref 38–126)
Anion gap: 10 (ref 5–15)
BUN: 16 mg/dL (ref 8–23)
CO2: 25 mmol/L (ref 22–32)
Calcium: 9.1 mg/dL (ref 8.9–10.3)
Chloride: 101 mmol/L (ref 98–111)
Creatinine: 0.8 mg/dL (ref 0.61–1.24)
GFR, Estimated: >60 mL/min (ref >60)
Glucose, Bld: 315 mg/dL — ABNORMAL HIGH (ref 70–99)
Potassium: 3.8 mmol/L (ref 3.5–5.1)
Sodium: 136 mmol/L (ref 135–145)
Total Bilirubin: 0.7 mg/dL (ref 0.3–1.2)
Total Protein: 6.4 g/dL — ABNORMAL LOW (ref 6.5–8.1)

## 2021-05-16 MED ORDER — SODIUM CHLORIDE 0.9% FLUSH
10.0000 mL | INTRAVENOUS | Status: DC | PRN
  Administered 2021-05-16: 10 mL

## 2021-05-16 MED ORDER — HEPARIN SOD (PORK) LOCK FLUSH 100 UNIT/ML IV SOLN
500.0000 [IU] | Freq: Once | INTRAVENOUS | Status: AC | PRN
  Administered 2021-05-16: 500 [IU]

## 2021-05-16 NOTE — Patient Instructions (Signed)

## 2021-05-16 NOTE — Progress Notes (Signed)
?[place] ?OFFICE PROGRESS NOTE ? ? ?Diagnosis: Pancreas cancer ? ?INTERVAL HISTORY:  ? ?Nathan Mora returns as scheduled.  He overall is feeling well.  He is managing diarrhea with Imodium.  Appetite varies.  No abdominal pain.  He is golfing frequently. ? ?Objective: ? ?Vital signs in last 24 hours: ? ?Blood pressure 111/67, pulse 67, temperature 98.1 ?F (36.7 ?C), temperature source Oral, resp. rate 18, height '5\' 8"'  (1.727 m), weight 164 lb (74.4 kg), SpO2 100 %. ?  ? ?Resp: Lungs clear bilaterally. ?Cardio: Regular rate and rhythm. ?GI: Abdomen soft and nontender.  No hepatosplenomegaly.  Soft, reducible midline hernia. ?Vascular: No leg edema.  Left lower leg is slightly larger than the right lower leg. ?Port-A-Cath without erythema. ? ?Lab Results: ? ?Lab Results  ?Component Value Date  ? WBC 4.2 04/19/2021  ? HGB 11.8 (L) 04/19/2021  ? HCT 35.4 (L) 04/19/2021  ? MCV 88.3 04/19/2021  ? PLT 86 (L) 04/19/2021  ? NEUTROABS 2.1 04/19/2021  ? ? ?Imaging: ? ?No results found. ? ?Medications: I have reviewed the patient's current medications. ? 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  Fat planes around the celiac axis preserved.  Less than 90 degree contact of the lateral wall of the SMA, no evidence of metastatic disease, 3 mm left lower lobe pulmonary nodule ?Cycle 1 FOLFOX 05/09/2020 (plan to add  irinotecan with cycle 2 pending tolerance) ?Cycle 2 FOLFIRINOX 05/23/2020 ?Cycle 3 FOLFIRINOX 06/06/2020, oxaliplatin dose reduced secondary to thrombocytopenia ?Cycle 4 FOLFIRINOX 06/27/2020, oxaliplatin further dose reduced secondary to thrombocytopenia ?CTs at [facility] 07/03/2020-redemonstration of a heterogeneous pancreatic head mass with borderline encasement of the SMA as well as abutment of the portal vein, SMV and likely the IVC.  Similar mildly enlarged porta hepatis and pericaval lymph nodes.  No convincing evidence of distant metastatic disease. 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  More than 180 degree involvement of the SMV and SMA ?Cycle 1 gemcitabine/Abraxane 11/22/2020, Udenyca ?Genetics counselor contacted him 11/26/2020-no pathogenic variants identified in the 84 genes analyzed.  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 ?Cycle 2 gemcitabine/Abraxane 12/06/2020, Udenyca ?Cycle 3 gemcitabine/Abraxane 12/19/2020, Udenyca ?Cycle 4 gemcitabine/Abraxane 01/03/2021, Udenyca ?Cycle 5 gemcitabine/Abraxane 01/17/2021, Udenyca ?Cycle 6 gemcitabine/Abraxane 01/31/2021, Udenyca ?CTs at [facility] 02/22/2021-similar pancreatic head mass with involvement of SMA/SMV.  No evidence of distant metastatic disease. ?Referred to Dr. Lisbeth Renshaw 02/26/2021 ?Pancreas SBRT 03/20/2021-03/29/2021 ?Obstructive jaundice secondary #1 ?ERCP 04/11/2020-prominent and floppy major  papilla, dilated bile duct secondary to a distal stricture, placement of an uncovered metal stent,  bile duct brushings-reactive glandular cells ?3.   Diabetes ?  ?4.   Hyperlipidemia ?  ?5.   Glaucoma ?  ?6.   Port-A-Cath placement 05/04/2020 Interventional Radiology ?  ?7.   Mild thrombocytopenia (121,000) 05/09/2020 ?  ? ?Disposition: Nathan Mora appears well.  There is no clinical evidence of progression of the pancreas cancer.  We will follow-up on the CA 19-9 from today.  He is scheduled for scans and follow-up at [facility] next month. ? ?He will return for follow-up here 06/20/2021.  We are available to see him sooner if needed. ? ? ? ?Nathan Mora ANP/GNP-BC  ? ?05/16/2021  ?11:20 AM ? ? ? ? ? ? ? ?

## 2021-05-17 LAB — CANCER ANTIGEN 19-9: CA 19-9: 613 U/mL — ABNORMAL HIGH (ref 0–35)

## 2021-05-22 ENCOUNTER — Telehealth: Payer: Self-pay

## 2021-05-22 NOTE — Telephone Encounter (Signed)
Nathan Mora   01/24/1946  - unable to send message from his "My Chart".   Discovered mid Feb that Nathan Mora has a hernia below incision           ? (umbilical ).  He does not have pain when palpated and can sleep on stomach.  Nathan Mora has great concern and feels is getting bigger.  He has a support belt . Starting this weekend his bld sugar have been crazy now taking insulin again  ( controlled before with Metformin BID)  I would like if you would see him.  He feels like something is off.   He can be reached at [phone] ?  ? ?  ? ?Unable to send from Nathan's "My Chart".    Not able to remove tumor on Friday, Dr Loletta Specter has put in order to have stent changed.  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  Look forward to seeing you again for my Tommy.  ?

## 2021-05-22 NOTE — Telephone Encounter (Signed)
The pt states that he has a hernia where a surgical scar is located.  He says it is not painful but seems bigger.  I have advised him to call the surgeon that did the surgery and touch base with that office.  The pt has been advised of the information and verbalized understanding.    ?

## 2021-05-28 ENCOUNTER — Telehealth: Payer: Self-pay | Admitting: Pharmacist

## 2021-05-28 NOTE — Progress Notes (Incomplete)
? ?Chronic Care Management ?Pharmacy Note ? ?05/28/2021 ?Name:  Nathan Mora MRN:  [id number] DOB:  [birth year] ? ?Summary: ?*** ? ?Recommendations/Changes made from today's visit: ?*** ? ?Plan: ?*** ? ? ?Subjective: ?OMAURI Mora is an 76 y.o. year old male who is a primary patient of [name], [name], MD.  The CCM team was consulted for assistance with disease management and care coordination needs.   ? ?{CCMTELEPHONEFACETOFACE:21091510} for {CCMINITIALFOLLOWUPCHOICE:21091511} in response to provider referral for pharmacy case management and/or care coordination services.  ? ?Consent to Services:  ?{CCMCONSENTOPTIONS:25074} ? ?Patient Care Team: ?[name], MD as PCP - General (Family Medicine) ?[name], MD as Consulting Physician (Oncology) ?[name], [facility] as Pharmacist (Pharmacist) ? ?Recent office visits:  ?02/28/21 [name], MD - Family Medicine (Video visit) - Labs were ordered. Follow up in 1 week with home readings fasting.  ?  ?02/21/21 Annual Medicare Wellness Completed. ?  ?  ?Recent consult visits:  ?05/25/21 [name] NP - Oncology - Malignant neoplasm of pancreas - Labs ordered. Oncology treatment administered. Follow up as scheduled on 06/20/21. ?  ?04/19/21 [name] MD - Oncology - Malignant Neoplasm of pancreas - Labs were ordered. Oncology treatment administered. Follow up in 1 month. ?  ?03/27/21 [name] DPM - Podiatry - Left foot pain - triamcinolone acetonide (KENALOG) 10 MG/ML injection 10 mg prescribed. Follow up as scheduled.  ?  ?03/22/21 [name] NP - Oncology - Malignant Neoplasm of pancreas - Labs were ordered. Oncology treatment performed. CT abdomen and pelvis ordered. Vascular dopplers ordered. Follow up in 4 weeks.  ?  ?02/26/21 [name] NP - Oncology - Malignant Neoplasm of pancreas - Labs were ordered. Oncology treatment performed. Referral placed for Radiation oncology. Follow up as scheduled. ?  ?02/22/21 [name] - Oncology - Malignant  Neoplasm of pancreas - Labs were ordered. Reviewed recent imaging. Radiation recommended. Follow up in 3 months.  ?  ?01/31/21 [name] MD - Oncology - Malignant Neoplasm of pancreas - Labs were ordered. Oncology treatment administered. Follow up as scheduled.  ?  ?01/17/21 [name] NP - Oncology - Malignant Neoplasm of pancreas - Labs were ordered. Oncology treatment performed. Follow up as scheduled.  ?  ?01/03/21 [name] MD - Oncology - Malignant Neoplasm of pancreas -Oncology treatment  administered. Follow up in 2 weeks.  ?  ?12/19/20 [name] MD - Oncology - Malignant Neoplasm of pancreas -Oncology treatment  administered. Follow up as scheduled.  ?  ?12/06/20 [name] NP - Oncology - Malignant Neoplasm of pancreas - Labs were ordered. Oncology treatment performed. Follow up as scheduled.  ?  ?  ?Hospital visits: 03/07/21 ?Medication Reconciliation was completed by comparing discharge summary, patient?s EMR and Pharmacy list, and upon discussion with patient. ?  ?Admitted to the hospital on 03/07/21 due to EGD. Discharge date was 03/07/21. Discharged from [facility].   ?  ?New?Medications Started at [facility] Discharge:?? ?None noted.  ?  ?Medication Changes at Hospital Discharge: ?None noted.  ?  ?Medications Discontinued at Hospital Discharge: ?None noted.  ?  ?Medications that remain the same after Hospital Discharge:??  ?All other medications will remain the same.   ?  ? ? ?Objective: ? ?Lab Results  ?Component Value Date  ? CREATININE 0.80 05/16/2021  ? BUN 16 05/16/2021  ? GFR 89.41 10/02/2020  ? GFRNONAA >60 05/16/2021  ? GFRAA 99 12/21/2019  ? NA 136 05/16/2021  ? K 3.8 05/16/2021  ?  CALCIUM 9.1 05/16/2021  ? CO2 25 05/16/2021  ? GLUCOSE 315 (H) 05/16/2021  ? ? ?Lab Results  ?Component Value Date/Time  ? HGBA1C 6.9 (H) 03/01/2021 12:57 PM  ? HGBA1C 7.0 (H) 12/21/2019 11:58 AM  ? GFR 89.41 10/02/2020 02:03 PM  ? GFR 92.86 04/16/2020 02:59 PM  ?  ?Last diabetic Eye exam: No results  found for: HMDIABEYEEXA  ?Last diabetic Foot exam: No results found for: HMDIABFOOTEX  ? ?Lab Results  ?Component Value Date  ? CHOL 126 12/21/2019  ? HDL 45 12/21/2019  ? [place] 64 12/21/2019  ? TRIG 88 12/21/2019  ? CHOLHDL 2.8 12/21/2019  ? ? ? ?  Latest Ref Rng & Units 05/16/2021  ? 10:30 AM 04/19/2021  ? 10:36 AM 03/22/2021  ? 10:38 AM  ?Hepatic Function  ?Total Protein 6.5 - 8.1 g/dL 6.4   6.5   5.6    ?Albumin 3.5 - 5.0 g/dL 4.3   4.4   4.2    ?AST 15 - 41 U/L '17   18   17    ' ?ALT 0 - 44 U/L '22   21   20    ' ?Alk Phosphatase 38 - 126 U/L 102   88   85    ?Total Bilirubin 0.3 - 1.2 mg/dL 0.7   0.6   0.8    ? ? ?Lab Results  ?Component Value Date/Time  ? TSH 0.999 09/02/2019 10:15 AM  ? TSH 0.997 10/11/2013 10:01 AM  ? ? ? ?  Latest Ref Rng & Units 04/19/2021  ? 10:36 AM 03/22/2021  ? 10:38 AM 01/31/2021  ?  9:40 AM  ?CBC  ?WBC 4.0 - 10.5 K/uL 4.2   4.7   14.7    ?Hemoglobin 13.0 - 17.0 g/dL 11.8   11.4   11.2    ?Hematocrit 39.0 - 52.0 % 35.4   34.6   34.4    ?Platelets 150 - 400 K/uL 86   79   84    ? ? ?No results found for: VD25OH ? ?Clinical ASCVD: {YES/NO:21197} ?The ASCVD Risk score (Arnett DK, et al., 2019) failed to calculate for the following reasons: ?  The valid total cholesterol range is 130 to 320 mg/dL   ? ? ?  02/21/2021  ? 11:25 AM 02/21/2021  ? 11:22 AM 10/29/2020  ?  1:48 PM  ?Depression screen PHQ 2/9  ?Decreased Interest 0 0 0  ?Down, Depressed, Hopeless 0 0 0  ?PHQ - 2 Score 0 0 0  ?Altered sleeping   1  ?Tired, decreased energy   0  ?Change in appetite   0  ?Feeling bad or failure about yourself    0  ?Trouble concentrating   0  ?Moving slowly or fidgety/restless   0  ?Suicidal thoughts   0  ?PHQ-9 Score   1  ?  ? ?***Other: (CHADS2VASc if Afib, MMRC or CAT for COPD, ACT, DEXA) ? ?Social History  ? ?Tobacco Use  ?Smoking Status Never  ?Smokeless Tobacco Never  ? ?BP Readings from Last 3 Encounters:  ?05/16/21 111/67  ?04/19/21 132/79  ?03/22/21 111/82  ? ?Pulse Readings from Last 3 Encounters:   ?05/16/21 67  ?04/19/21 79  ?03/22/21 94  ? ?Wt Readings from Last 3 Encounters:  ?05/16/21 164 lb (74.4 kg)  ?04/19/21 162 lb 6.4 oz (73.7 kg)  ?03/22/21 169 lb 12.8 oz (77 kg)  ? ?BMI Readings from Last 3 Encounters:  ?05/16/21 24.94 kg/m?  ?04/19/21 24.69 kg/m?  ?  03/22/21 25.82 kg/m?  ? ? ?Assessment/Interventions: Review of patient past medical history, allergies, medications, health status, including review of consultants reports, laboratory and other test data, was performed as part of comprehensive evaluation and provision of chronic care management services.  ? ?SDOH:  (Social Determinants of Health) assessments and interventions performed: {yes/no:20286} ? ?SDOH Screenings  ? ?Alcohol Screen: Low Risk   ? Last Alcohol Screening Score (AUDIT): 1  ?Depression (PHQ2-9): Low Risk   ? PHQ-2 Score: 0  ?Financial Resource Strain: Low Risk   ? Difficulty of Paying Living Expenses: Not hard at all  ?Food Insecurity: No Food Insecurity  ? Worried About Charity fundraiser in the Last Year: Never true  ? Ran Out of Food in the Last Year: Never true  ?Housing: Low Risk   ? Last Housing Risk Score: 0  ?Physical Activity: Sufficiently Active  ? Days of Exercise per Week: 3 days  ? Minutes of Exercise per Session: 50 min  ?Social Connections: Socially Integrated  ? Frequency of Communication with Friends and Family: Three times a week  ? Frequency of Social Gatherings with Friends and Family: Three times a week  ? Attends Religious Services: More than 4 times per year  ? Active Member of Clubs or Organizations: Yes  ? Attends Archivist Meetings: More than 4 times per year  ? Marital Status: Married  ?Stress: No Stress Concern Present  ? Feeling of Stress : Not at all  ?Tobacco Use: Low Risk   ? Smoking Tobacco Use: Never  ? Smokeless Tobacco Use: Never  ? Passive Exposure: Not on file  ?Transportation Needs: No Transportation Needs  ? Lack of Transportation (Medical): No  ? Lack of Transportation  (Non-Medical): No  ? ? ?CCM Care Plan ? ?No Known Allergies ? ?Medications Reviewed Today   ? ? Reviewed by Owens Shark, NP (Nurse Practitioner) on 05/16/21 at 1153  Med List Status: <None>  ? ?Medication Order Cyndie Chime

## 2021-05-28 NOTE — Progress Notes (Signed)
? ? ?Chronic Care Management ?Pharmacy Assistant  ? ?Name: Nathan Mora  MRN: [id number] DOB: April 17, 1945 ? ? ?Nathan Mora is an 76 y.o. year old male who presents for his initial CCM visit with the clinical pharmacist. ? ? ?Reason for Encounter: Chart Prep for Initial Visit with CPP ?  ? ?Recent office visits:  ?02/28/21 [name], MD - Family Medicine (Video visit) - Labs were ordered. Follow up in 1 week with home readings fasting.  ? ?02/21/21 Annual Medicare Wellness Completed. ? ? ?Recent consult visits:  ?05/25/21 [name] NP - Oncology - Malignant neoplasm of pancreas - Labs ordered. Oncology treatment administered. Follow up as scheduled on 06/20/21. ? ?04/19/21 [name] MD - Oncology - Malignant Neoplasm of pancreas - Labs were ordered. Oncology treatment administered. Follow up in 1 month. ? ?03/27/21 [name] DPM - Podiatry - Left foot pain - triamcinolone acetonide (KENALOG) 10 MG/ML injection 10 mg prescribed. Follow up as scheduled.  ? ?03/22/21 [name] NP - Oncology - Malignant Neoplasm of pancreas - Labs were ordered. Oncology treatment performed. CT abdomen and pelvis ordered. Vascular dopplers ordered. Follow up in 4 weeks.  ? ?02/26/21 [name] NP - Oncology - Malignant Neoplasm of pancreas - Labs were ordered. Oncology treatment performed. Referral placed for Radiation oncology. Follow up as scheduled. ? ?02/22/21 [name] - Oncology - Malignant Neoplasm of pancreas - Labs were ordered. Reviewed recent imaging. Radiation recommended. Follow up in 3 months.  ? ?01/31/21 [name] MD - Oncology - Malignant Neoplasm of pancreas - Labs were ordered. Oncology treatment administered. Follow up as scheduled.  ? ?01/17/21 [name] NP - Oncology - Malignant Neoplasm of pancreas - Labs were ordered. Oncology treatment performed. Follow up as scheduled.  ? ?01/03/21 [name] MD - Oncology - Malignant Neoplasm of pancreas -Oncology treatment  administered. Follow up in 2 weeks.   ? ?12/19/20 [name] MD - Oncology - Malignant Neoplasm of pancreas -Oncology treatment  administered. Follow up as scheduled.  ? ?12/06/20 [name] NP - Oncology - Malignant Neoplasm of pancreas - Labs were ordered. Oncology treatment performed. Follow up as scheduled.  ? ? ?Hospital visits: 03/07/21 ?Medication Reconciliation was completed by comparing discharge summary, patient?s EMR and Pharmacy list, and upon discussion with patient. ? ?Admitted to the hospital on 03/07/21 due to EGD. Discharge date was 03/07/21. Discharged from [facility].   ? ?New?Medications Started at [facility] Discharge:?? ?None noted.  ? ?Medication Changes at Hospital Discharge: ?None noted.  ? ?Medications Discontinued at Hospital Discharge: ?None noted.  ? ?Medications that remain the same after Hospital Discharge:??  ?All other medications will remain the same.   ? ? ?Medications: ?Outpatient Encounter Medications as of 05/28/2021  ?Medication Sig Note  ? acetaminophen (TYLENOL) 500 MG tablet Take 1,000 mg by mouth every 8 (eight) hours as needed for mild pain.   ? atorvastatin (LIPITOR) 40 MG tablet TAKE 1/2 TABLET BY MOUTH DAILY AT 6 IN THE EVENING   ? BD PEN NEEDLE NANO 2ND GEN 32G X 4 MM MISC    ? blood glucose meter kit and supplies Test once per day - fasting or 2 hours after meal. Dispense based on patient and insurance preference.   ? brimonidine-timolol (COMBIGAN) 0.2-0.5 % ophthalmic solution Place 1 drop into the left eye every 12 (twelve) hours.   ? Continuous Blood Gluc Sensor (FREESTYLE LIBRE 2 SENSOR) MISC Inject 1 sensor to the skin every 14 days for continuous glucose  monitoring.   ? COVID-19 mRNA bivalent vaccine, Pfizer, (PFIZER COVID-19 VAC BIVALENT) injection Inject into the muscle. (Patient not taking: Reported on 03/22/2021)   ? CREON 36000-114000 units CPEP capsule TAKE 1 CAPSULE BY MOUTH WITH MEALS AND WITH SNACKS AS DIRECTED BY YOUR DOCTOR   ? doxazosin (CARDURA) 2 MG tablet TAKE ONE TABLET BY MOUTH  DAILY   ? FLUZONE HIGH-DOSE QUADRIVALENT 0.7 ML SUSY    ? gabapentin (NEURONTIN) 800 MG tablet Take 400 mg by mouth at bedtime as needed (pain).   ? ibuprofen (ADVIL) 200 MG tablet Take 400 mg by mouth every evening.   ? insulin aspart (NOVOLOG) 100 UNIT/ML FlexPen Inject 0-6 Units into the skin 3 (three) times daily with meals. Dose per sliding scale, 150-200= 2 units, 201-250= 4 units, 251-300= 6 units (Patient not taking: Reported on 05/16/2021) 06/06/2020: 150-200= 2 units ?201-250= 4 units ?251-300= 6 units  ? Insulin Glargine (BASAGLAR KWIKPEN) 100 UNIT/ML Inject 9 Units into the skin at bedtime. (Patient not taking: Reported on 05/16/2021)   ? Lancets (ONETOUCH DELICA PLUS JJHERD40C) MISC TEST ONCE A DAY - FASTING OR 2 HOURS AFTER MEAL   ? latanoprost (XALATAN) 0.005 % ophthalmic solution Place 1 drop into the left eye at bedtime.   ? latanoprost (XALATAN) 0.005 % ophthalmic solution Apply to eye.   ? lidocaine-prilocaine (EMLA) cream Apply 1 application topically as needed.   ? LORazepam (ATIVAN) 1 MG tablet TAKE HALF TO 2 TABLETS BY MOUTH AT BEDTIME AS NEEDED FOR ANXIETY; TAKE 1 TABLET EVERY 8 HOURS AS NEEDED FOR ANXIETY OR NAUSEA   ? metFORMIN (GLUCOPHAGE) 500 MG tablet Take 1 tablet (500 mg total) by mouth 2 (two) times daily with a meal.   ? omeprazole (PRILOSEC) 40 MG capsule Take 1 capsule (40 mg total) by mouth 2 (two) times daily before a meal.   ? ondansetron (ZOFRAN) 8 MG tablet Take 1 tablet (8 mg total) by mouth every 8 (eight) hours as needed for nausea or vomiting. Do not begin until 72 hours after day 1 chemotherapy (Patient not taking: Reported on 05/16/2021)   ? ONETOUCH ULTRA test strip TEST EVERY DAY FASTING OR 2 HOURS AFTER A MEAL   ? prochlorperazine (COMPAZINE) 10 MG tablet Take 1 tablet (10 mg total) by mouth every 6 (six) hours as needed for nausea or vomiting. (Patient not taking: Reported on 05/16/2021)   ? sildenafil (REVATIO) 20 MG tablet 1 to 3 tabs up to QD prior to onset of sexual  activity. (Patient taking differently: Take 20-60 mg by mouth daily as needed (erectile dysfunction).)   ? terbinafine (LAMISIL) 250 MG tablet    ? ?No facility-administered encounter medications on file as of 05/28/2021.  ? ? ? ?Have you seen any other providers since your last visit? no ? ?Any changes in your medications or health? no ? ?Any side effects from any medications? no ? ?Do you have an symptoms or problems not managed by your medications? No ? ?Any concerns about your health right now? Yes the same concerns he has had for the last 2 years in regards to his pancreatic cancer since he is not a candidate at this time. He also has developed a hernia from his surgery in Sept. He is seeing his surgeon on Thursday to discuss this.  ? ?Has your provider asked that you check blood pressure, blood sugar, or follow special diet at home? Yes. Patient checks his blood sugars daily at home and is on insulin. He  is on continuous monitoring.  ? ?Do you get any type of exercise on a regular basis? Yes he plays golf twice a week and walks a lot and remains active when he is able. He does have fatigue in the mornings.  ? ?Can you think of a goal you would like to reach for your health? Patient would like to continue to work towards living as long as he can.  ? ?Do you have any problems getting your medications? no ? ?Is there anything that you would like to discuss during the appointment? Patient could not think of anything at this time.  ? ?Please bring medications and supplements to appointment. Patient confirmed appointment date and time.  ? ? ?Care Gaps ? ?AWV: done 02/21/21 ?Colonoscopy: EGD done 03/07/21 ?DM Eye Exam: unknown  ?DM Foot Exam: unknown  ?Microalbumin: unknown ?HbgAIC: done 03/01/21 (6.9) ?DEXA: N/A ?Mammogram: N/A ? ? ?Star Rating Drugs: ?atorvastatin (LIPITOR) 40 MG tablet - last filled 04/11/21 90 days  ?metFORMIN (GLUCOPHAGE) 500 MG tablet - last filled 04/11/21 90 days  ? ? ?Future Appointments  ?Date Time  Provider [place]  ?05/31/2021 10:00 AM [facility] CCM PHARMACIST [facility]  ?06/20/2021 10:00 AM [facility] PHLEBOTOMIST [facility] None  ?06/20/2021 10:15 AM [facility] FLUSH ROOM [facility] None  ?5/25

## 2021-05-30 DIAGNOSIS — C259 Malignant neoplasm of pancreas, unspecified: Secondary | ICD-10-CM | POA: Diagnosis not present

## 2021-05-30 DIAGNOSIS — K769 Liver disease, unspecified: Secondary | ICD-10-CM | POA: Diagnosis not present

## 2021-05-30 DIAGNOSIS — C250 Malignant neoplasm of head of pancreas: Secondary | ICD-10-CM | POA: Diagnosis not present

## 2021-05-31 ENCOUNTER — Ambulatory Visit (INDEPENDENT_AMBULATORY_CARE_PROVIDER_SITE_OTHER): Payer: Medicare HMO | Admitting: Pharmacist

## 2021-05-31 ENCOUNTER — Telehealth: Payer: Self-pay | Admitting: *Deleted

## 2021-05-31 ENCOUNTER — Telehealth: Payer: Medicare HMO

## 2021-05-31 DIAGNOSIS — C250 Malignant neoplasm of head of pancreas: Secondary | ICD-10-CM

## 2021-05-31 DIAGNOSIS — E785 Hyperlipidemia, unspecified: Secondary | ICD-10-CM

## 2021-05-31 NOTE — Patient Instructions (Addendum)
Visit Information ? ? Goals Addressed   ? ?  ?  ?  ?  ? This Visit's Progress  ?  Manage Fatigue (Tiredness-Cancer Posttreatment)     ?  Timeframe:  Long-Range Goal ?Priority:  High ?Start Date:  05/31/21                           ?Expected End Date: 12/01/21                     ? ?Follow Up Date 08/31/21  ?  ?- develop a new routine to improve sleep ?- exercise at least 2 to 3 times per week ?- get at least 8 hours of sleep at night ?- get outdoors every day (weather permitting)  ?  ?Why is this important?   ?Cancer treatment and its side effects can drain your energy. It can keep you from doing things you would like to do.  ?There are many things that can be done to manage fatigue.  ?  ?Notes:  ?  ? ?  ? ?Patient Care Plan: General Pharmacy (Adult)  ?  ? ?Problem Identified: HLD, Pancreatic Cancer   ?Priority: High  ?Onset Date: 05/31/2021  ?Note:   ?Current Barriers:  ?Unable to achieve control of sleep distubance and fatigue  ? ?Pharmacist Clinical Goal(s):  ?Patient will achieve improvement in sleep and energy as evidenced by symptoms through collaboration with PharmD and provider.  ? ?Interventions: ?1:1 collaboration with [name], MD regarding development and update of comprehensive plan of care as evidenced by provider attestation and co-signature ?Inter-disciplinary care team collaboration (see longitudinal plan of care) ?Comprehensive medication review performed; medication list updated in electronic medical record ? ?Hyperlipidemia: (LDL goal < 100) ?-Controlled ?-Current treatment: ?Atorvastatin '40mg'$  Appropriate, Effective, Safe, Accessible ?-Medications previously tried: none noted  ?-Current dietary patterns: does not have much of an appetite due to cancer ?-Current exercise habits: plays golf twice per week usually.  No other organized exercise ?-Educated on Cholesterol goals;  ?Benefits of statin for ASCVD risk reduction; ?Most recent LDL well controlled ?-Recommended to continue current  medication ? ?Pancreatic Cancer (Goal: Minimize symptoms, reduce suffering) ?-Controlled ?-Current treatment  ?Metformin '500mg'$  twice daily Appropriate, Effective, Safe, Accessible ?Basaglar 9 units hs  Appropriate, Effective, Safe, Accessible ?Novolog TID with meals via sliding scale  Appropriate, Effective, Safe, Accessible ?-Glucose this morning - 197, normally 115-130.  Sometimes in the afternoons his glucose will be > 300.  Patient reports feeling fatigued this morning more than usual.  This has been ongoing for about a month or so but just felt more than normal today.  He is taking Lorazepam at night for sleep as well as gabapentin and sometimes either Tylenol PM or wifes codeine cough syrup.  Counseled on avoidance of things like the cough syrup and Tylenol PM if possible.  He did just get some news from [facility] yesterday that he has some spots on his liver.  He denies any depression associated with this.  Could consider addition of Trazodone hs to help him sleep so he can eliminate some of the other OTC and cough syrups he is using at bedtime. ?Will consult with PCP. ?-Recommended to continue current medication ? ?Patient Goals/Self-Care Activities ?Patient will:  ?- work to improve sleep and increase energy levels ? ?Follow Up Plan: The care management team will reach out to the patient again over the next 180 days.  ? ?  ? ?  Goal: Patient-Specific Goal   ?  ? ? ?Mr. Rains was given information about Chronic Care Management services today including:  ?CCM service includes personalized support from designated clinical staff supervised by his physician, including individualized plan of care and coordination with other care providers ?24/7 contact phone numbers for assistance for urgent and routine care needs. ?Standard insurance, coinsurance, copays and deductibles apply for chronic care management only during months in which we provide at least 20 minutes of these services. Most insurances cover these services at  100%, however patients may be responsible for any copay, coinsurance and/or deductible if applicable. This service may help you avoid the need for more expensive face-to-face services. ?Only one practitioner may furnish and bill the service in a calendar month. ?The patient may stop CCM services at any time (effective at the end of the month) by phone call to the office staff. ? ?Patient agreed to services and verbal consent obtained.  ? ?The patient verbalized understanding of instructions, educational materials, and care plan provided today and agreed to receive a mailed copy of patient instructions, educational materials, and care plan.  ?Telephone follow up appointment with pharmacy team member scheduled for: 1 year ? ?[name], [facility]  ?Beverly Milch, PharmD ?Clinical Pharmacist  ?[place] [facility] ?([phone] ? ?

## 2021-05-31 NOTE — Telephone Encounter (Signed)
Nathan Mora called to inquire if Dr. Zenia Resides had spoken with Dr. Benay Spice yet regarding visit on 5/4 and resumption of chemotherapy. Informed her that Dr. Benay Spice is out of the office until Monday and will discuss w/Dr. Zenia Resides at that time.  ?Surgeon did call on 5/4 and left contact # for Dr. Benay Spice to call. ?

## 2021-05-31 NOTE — Progress Notes (Signed)
? ?Chronic Care Management ?Pharmacy Note ? ?05/31/2021 ?Name:  Nathan Mora MRN:  [id number] DOB:  Jun 14, 1945 ? ?Summary: ?Initial visit with PharmD.  Patient got news yesterday at [facility] that he has spots on his liver.  Feeling really fatigued today and has been ongoing for about a month.  Sleep is decent but today fell asleep before he could eat his breakfast.  Interested in sleep aid such as Trazodone or Temazepam. ? ?Recommendations/Changes made from today's visit: ?Consult with PCP on sleep aid - may need visit ?Patient requests refill of Lorazepam ? ?Plan: ?FU 6 months ? ? ?Subjective: ?Nathan Mora is an 76 y.o. year old male who is a primary patient of [name], [name], MD.  The CCM team was consulted for assistance with disease management and care coordination needs.   ? ?Engaged with patient by telephone for initial visit in response to provider referral for pharmacy case management and/or care coordination services.  ? ?Consent to Services:  ?The patient was given the following information about Chronic Care Management services today, agreed to services, and gave verbal consent: 1. CCM service includes personalized support from designated clinical staff supervised by the primary care provider, including individualized plan of care and coordination with other care providers 2. 24/7 contact phone numbers for assistance for urgent and routine care needs. 3. Service will only be billed when office clinical staff spend 20 minutes or more in a month to coordinate care. 4. Only one practitioner may furnish and bill the service in a calendar month. 5.The patient may stop CCM services at any time (effective at the end of the month) by phone call to the office staff. 6. The patient will be responsible for cost sharing (co-pay) of up to 20% of the service fee (after annual deductible is met). Patient agreed to services and consent obtained. ? ?Patient Care Team: ?[name], MD as PCP - General (Family  Medicine) ?[name], MD as Consulting Physician (Oncology) ?[name], [facility] as Pharmacist (Pharmacist) ? ?Recent office visits:  ?02/28/21 [name], MD - Family Medicine (Video visit) - Labs were ordered. Follow up in 1 week with home readings fasting.  ?  ?02/21/21 Annual Medicare Wellness Completed. ?  ?  ?Recent consult visits:  ?05/25/21 [name] NP - Oncology - Malignant neoplasm of pancreas - Labs ordered. Oncology treatment administered. Follow up as scheduled on 06/20/21. ?  ?04/19/21 [name] MD - Oncology - Malignant Neoplasm of pancreas - Labs were ordered. Oncology treatment administered. Follow up in 1 month. ?  ?03/27/21 [name] DPM - Podiatry - Left foot pain - triamcinolone acetonide (KENALOG) 10 MG/ML injection 10 mg prescribed. Follow up as scheduled.  ?  ?03/22/21 [name] NP - Oncology - Malignant Neoplasm of pancreas - Labs were ordered. Oncology treatment performed. CT abdomen and pelvis ordered. Vascular dopplers ordered. Follow up in 4 weeks.  ?  ?02/26/21 [name] NP - Oncology - Malignant Neoplasm of pancreas - Labs were ordered. Oncology treatment performed. Referral placed for Radiation oncology. Follow up as scheduled. ?  ?02/22/21 [name] - Oncology - Malignant Neoplasm of pancreas - Labs were ordered. Reviewed recent imaging. Radiation recommended. Follow up in 3 months.  ?  ?01/31/21 [name] MD - Oncology - Malignant Neoplasm of pancreas - Labs were ordered. Oncology treatment administered. Follow up as scheduled.  ?  ?01/17/21 [name] NP - Oncology - Malignant Neoplasm of pancreas - Labs were ordered. Oncology treatment  performed. Follow up as scheduled.  ?  ?01/03/21 [name] MD - Oncology - Malignant Neoplasm of pancreas -Oncology treatment  administered. Follow up in 2 weeks.  ?  ?12/19/20 [name] MD - Oncology - Malignant Neoplasm of pancreas -Oncology treatment  administered. Follow up as scheduled.  ?  ?12/06/20 [name] NP  - Oncology - Malignant Neoplasm of pancreas - Labs were ordered. Oncology treatment performed. Follow up as scheduled.  ?  ?  ?Hospital visits: 03/07/21 ?Medication Reconciliation was completed by comparing discharge summary, patient?s EMR and Pharmacy list, and upon discussion with patient. ?  ?Admitted to the hospital on 03/07/21 due to EGD. Discharge date was 03/07/21. Discharged from [facility].   ?  ?New?Medications Started at [facility] Discharge:?? ?None noted.  ?  ?Medication Changes at Hospital Discharge: ?None noted.  ?  ?Medications Discontinued at Hospital Discharge: ?None noted.  ?  ?Medications that remain the same after Hospital Discharge:??  ?All other medications will remain the same.   ? ? ?Objective: ? ?Lab Results  ?Component Value Date  ? CREATININE 0.80 05/16/2021  ? BUN 16 05/16/2021  ? GFR 89.41 10/02/2020  ? GFRNONAA >60 05/16/2021  ? GFRAA 99 12/21/2019  ? NA 136 05/16/2021  ? K 3.8 05/16/2021  ? CALCIUM 9.1 05/16/2021  ? CO2 25 05/16/2021  ? GLUCOSE 315 (H) 05/16/2021  ? ? ?Lab Results  ?Component Value Date/Time  ? HGBA1C 6.9 (H) 03/01/2021 12:57 PM  ? HGBA1C 7.0 (H) 12/21/2019 11:58 AM  ? GFR 89.41 10/02/2020 02:03 PM  ? GFR 92.86 04/16/2020 02:59 PM  ?  ?Last diabetic Eye exam: No results found for: HMDIABEYEEXA  ?Last diabetic Foot exam: No results found for: HMDIABFOOTEX  ? ?Lab Results  ?Component Value Date  ? CHOL 126 12/21/2019  ? HDL 45 12/21/2019  ? Hubbard 64 12/21/2019  ? TRIG 88 12/21/2019  ? CHOLHDL 2.8 12/21/2019  ? ? ? ?  Latest Ref Rng & Units 05/16/2021  ? 10:30 AM 04/19/2021  ? 10:36 AM 03/22/2021  ? 10:38 AM  ?Hepatic Function  ?Total Protein 6.5 - 8.1 g/dL 6.4   6.5   5.6    ?Albumin 3.5 - 5.0 g/dL 4.3   4.4   4.2    ?AST 15 - 41 U/L _0 ?ALT 0 - 44 U/L _1 ?Alk Phosphatase 38 - 126 U/L 102   88   85    ?Total Bilirubin 0.3 - 1.2 mg/dL 0.7   0.6   0.8    ? ? ?Lab Results  ?Component Value Date/Time  ? TSH 0.999 09/02/2019 10:15 AM  ? TSH 0.997  10/11/2013 10:01 AM  ? ? ? ?  Latest Ref Rng & Units 04/19/2021  ? 10:36 AM 03/22/2021  ? 10:38 AM 01/31/2021  ?  9:40 AM  ?CBC  ?WBC 4.0 - 10.5 K/uL 4.2   4.7   14.7    ?Hemoglobin 13.0 - 17.0 g/dL 11.8   11.4   11.2    ?Hematocrit 39.0 - 52.0 % 35.4   34.6   34.4    ?Platelets 150 - 400 K/uL 86   79   84    ? ? ?No results found for: VD25OH ? ?Clinical ASCVD: No  ?The ASCVD Risk score (Arnett DK, et al., 2019) failed to calculate for the following reasons: ?  The valid total cholesterol range  is 130 to 320 mg/dL   ? ? ?  02/21/2021  ? 11:25 AM 02/21/2021  ? 11:22 AM 10/29/2020  ?  1:48 PM  ?Depression screen PHQ 2/9  ?Decreased Interest 0 0 0  ?Down, Depressed, Hopeless 0 0 0  ?PHQ - 2 Score 0 0 0  ?Altered sleeping   1  ?Tired, decreased energy   0  ?Change in appetite   0  ?Feeling bad or failure about yourself    0  ?Trouble concentrating   0  ?Moving slowly or fidgety/restless   0  ?Suicidal thoughts   0  ?PHQ-9 Score   1  ?  ? ? ?Social History  ? ?Tobacco Use  ?Smoking Status Never  ?Smokeless Tobacco Never  ? ?BP Readings from Last 3 Encounters:  ?05/16/21 111/67  ?04/19/21 132/79  ?03/22/21 111/82  ? ?Pulse Readings from Last 3 Encounters:  ?05/16/21 67  ?04/19/21 79  ?03/22/21 94  ? ?Wt Readings from Last 3 Encounters:  ?05/16/21 164 lb (74.4 kg)  ?04/19/21 162 lb 6.4 oz (73.7 kg)  ?03/22/21 169 lb 12.8 oz (77 kg)  ? ?BMI Readings from Last 3 Encounters:  ?05/16/21 24.94 kg/m?  ?04/19/21 24.69 kg/m?  ?03/22/21 25.82 kg/m?  ? ? ?Assessment/Interventions: Review of patient past medical history, allergies, medications, health status, including review of consultants reports, laboratory and other test data, was performed as part of comprehensive evaluation and provision of chronic care management services.  ? ?SDOH:  (Social Determinants of Health) assessments and interventions performed: Yes ? ?Financial Resource Strain: Low Risk   ? Difficulty of Paying Living Expenses: Not hard at all  ? ?Food Insecurity: No Food  Insecurity  ? Worried About Charity fundraiser in the Last Year: Never true  ? Ran Out of Food in the Last Year: Never true  ? ? ?SDOH Screenings  ? ?Alcohol Screen: Low Risk   ? Last Alcohol Screening Scor

## 2021-06-02 ENCOUNTER — Other Ambulatory Visit: Payer: Self-pay | Admitting: Registered Nurse

## 2021-06-02 DIAGNOSIS — C250 Malignant neoplasm of head of pancreas: Secondary | ICD-10-CM

## 2021-06-03 ENCOUNTER — Telehealth: Payer: Self-pay | Admitting: *Deleted

## 2021-06-03 ENCOUNTER — Encounter: Payer: Self-pay | Admitting: Oncology

## 2021-06-03 ENCOUNTER — Encounter: Payer: Self-pay | Admitting: Nurse Practitioner

## 2021-06-03 MED ORDER — LORAZEPAM 1 MG PO TABS: ORAL_TABLET | ORAL | 1 refills | Status: DC

## 2021-06-03 NOTE — Telephone Encounter (Signed)
I will defer to you on this as it looks like you'd dc'd this. ? ?Thanks, ? ?Rich

## 2021-06-03 NOTE — Telephone Encounter (Signed)
Controlled substance database reviewed.  Lorazepam 1 mg #60 last refilled 04/18/2021.  Initially 0.'5mg'$  ordered, reordered '1mg'$  dose instead.  ?

## 2021-06-03 NOTE — Telephone Encounter (Signed)
Patient's wife is calling in stating that her husband is needing these medications. Asked if we can send this in for him.  ?

## 2021-06-03 NOTE — Telephone Encounter (Signed)
Called Nathan Mora with appointment w/NP tomorrow at [phone]. They will be there. She report she is very discouraged and depressed. ?

## 2021-06-04 ENCOUNTER — Inpatient Hospital Stay: Payer: Medicare HMO | Attending: Oncology | Admitting: Nurse Practitioner

## 2021-06-04 ENCOUNTER — Other Ambulatory Visit: Payer: Self-pay

## 2021-06-04 ENCOUNTER — Encounter: Payer: Self-pay | Admitting: Nurse Practitioner

## 2021-06-04 ENCOUNTER — Telehealth: Payer: Self-pay

## 2021-06-04 ENCOUNTER — Inpatient Hospital Stay: Payer: Medicare HMO

## 2021-06-04 ENCOUNTER — Encounter: Payer: Self-pay | Admitting: Oncology

## 2021-06-04 ENCOUNTER — Ambulatory Visit
Admission: RE | Admit: 2021-06-04 | Discharge: 2021-06-04 | Disposition: A | Discharge status: Home / Self Care | Payer: Self-pay | Source: Ambulatory Visit | Attending: Nurse Practitioner | Admitting: Nurse Practitioner

## 2021-06-04 VITALS — BP 122/75 | HR 78 | Temp 98.2°F | Resp 18 | Ht 68.0 in | Wt 159.6 lb

## 2021-06-04 DIAGNOSIS — D696 Thrombocytopenia, unspecified: Secondary | ICD-10-CM | POA: Diagnosis not present

## 2021-06-04 DIAGNOSIS — E785 Hyperlipidemia, unspecified: Secondary | ICD-10-CM | POA: Diagnosis not present

## 2021-06-04 DIAGNOSIS — H409 Unspecified glaucoma: Secondary | ICD-10-CM | POA: Diagnosis not present

## 2021-06-04 DIAGNOSIS — F32A Depression, unspecified: Secondary | ICD-10-CM | POA: Diagnosis not present

## 2021-06-04 DIAGNOSIS — C250 Malignant neoplasm of head of pancreas: Secondary | ICD-10-CM

## 2021-06-04 DIAGNOSIS — E119 Type 2 diabetes mellitus without complications: Secondary | ICD-10-CM | POA: Diagnosis not present

## 2021-06-04 DIAGNOSIS — R630 Anorexia: Secondary | ICD-10-CM | POA: Insufficient documentation

## 2021-06-04 DIAGNOSIS — Z1379 Encounter for other screening for genetic and chromosomal anomalies: Secondary | ICD-10-CM

## 2021-06-04 DIAGNOSIS — Z923 Personal history of irradiation: Secondary | ICD-10-CM | POA: Insufficient documentation

## 2021-06-04 DIAGNOSIS — K831 Obstruction of bile duct: Secondary | ICD-10-CM | POA: Diagnosis not present

## 2021-06-04 DIAGNOSIS — R69 Illness, unspecified: Secondary | ICD-10-CM | POA: Diagnosis not present

## 2021-06-04 LAB — CMP (CANCER CENTER ONLY)
ALT: 13 U/L (ref 0–44)
AST: 10 U/L — ABNORMAL LOW (ref 15–41)
Albumin: 3.9 g/dL (ref 3.5–5.0)
Alkaline Phosphatase: 111 U/L (ref 38–126)
Anion gap: 10 (ref 5–15)
BUN: 14 mg/dL (ref 8–23)
CO2: 27 mmol/L (ref 22–32)
Calcium: 9.1 mg/dL (ref 8.9–10.3)
Chloride: 99 mmol/L (ref 98–111)
Creatinine: 0.69 mg/dL (ref 0.61–1.24)
GFR, Estimated: >60 mL/min (ref >60)
Glucose, Bld: 307 mg/dL — ABNORMAL HIGH (ref 70–99)
Potassium: 3.6 mmol/L (ref 3.5–5.1)
Sodium: 136 mmol/L (ref 135–145)
Total Bilirubin: 0.6 mg/dL (ref 0.3–1.2)
Total Protein: 6.4 g/dL — ABNORMAL LOW (ref 6.5–8.1)

## 2021-06-04 LAB — CBC WITH DIFFERENTIAL (CANCER CENTER ONLY)
Abs Immature Granulocytes: 0.09 10*3/uL — ABNORMAL HIGH (ref 0.00–0.07)
Basophils Absolute: 0 10*3/uL (ref 0.0–0.1)
Basophils Relative: 0 %
Eosinophils Absolute: 0 10*3/uL (ref 0.0–0.5)
Eosinophils Relative: 0 %
HCT: 34.4 % — ABNORMAL LOW (ref 39.0–52.0)
Hemoglobin: 11.3 g/dL — ABNORMAL LOW (ref 13.0–17.0)
Immature Granulocytes: 1 %
Lymphocytes Relative: 18 %
Lymphs Abs: 1.3 10*3/uL (ref 0.7–4.0)
MCH: 28 pg (ref 26.0–34.0)
MCHC: 32.8 g/dL (ref 30.0–36.0)
MCV: 85.1 fL (ref 80.0–100.0)
Monocytes Absolute: 1.4 10*3/uL — ABNORMAL HIGH (ref 0.1–1.0)
Monocytes Relative: 20 %
Neutro Abs: 4.3 10*3/uL (ref 1.7–7.7)
Neutrophils Relative %: 61 %
Platelet Count: 163 10*3/uL (ref 150–400)
RBC: 4.04 MIL/uL — ABNORMAL LOW (ref 4.22–5.81)
RDW: 13.5 % (ref 11.5–15.5)
WBC Count: 7 10*3/uL (ref 4.0–10.5)
nRBC: 0 % (ref 0.0–0.2)

## 2021-06-04 MED ORDER — SODIUM CHLORIDE 0.9% FLUSH
10.0000 mL | INTRAVENOUS | Status: DC | PRN
  Administered 2021-06-04: 10 mL

## 2021-06-04 MED ORDER — ALTEPLASE 2 MG IJ SOLR: 2.0000 mg | Freq: Once | INTRAMUSCULAR | Status: DC | PRN

## 2021-06-04 MED ORDER — MIRTAZAPINE 15 MG PO TABS: 15.0000 mg | ORAL_TABLET | Freq: Every day | ORAL | 1 refills | Status: DC

## 2021-06-04 MED ORDER — HEPARIN SOD (PORK) LOCK FLUSH 100 UNIT/ML IV SOLN
500.0000 [IU] | Freq: Once | INTRAVENOUS | Status: AC | PRN
  Administered 2021-06-04: 500 [IU]

## 2021-06-04 NOTE — Telephone Encounter (Signed)
-----   Message from [name], [facility] sent at 05/31/2021  1:52 PM EDT ----- ?Patient requesting refill on Lorazepam sent in to [facility]. ? ?Thanks! ? ?

## 2021-06-04 NOTE — Telephone Encounter (Signed)
Spoke w/ pt and advised that the Lorazepam was sent to [place] on May 8,2023. PT expressed verbal understanding  ?

## 2021-06-04 NOTE — Progress Notes (Signed)
?[place] ?OFFICE PROGRESS NOTE ? ? ?Diagnosis: Pancreas cancer ? ?INTERVAL HISTORY:  ? ?Nathan Mora returns for follow-up.  He had CT scans and follow-up with Dr. Zenia Resides last week at [facility].  New liver lesions were noted.  Nathan Mora reports progressive fatigue.  He is experiencing depression and anxiety, difficulty sleeping.  He is taking lorazepam periodically.  Appetite is poor.  He notes an alteration in taste.  He has an abdominal hernia.  He feels the hernia is pressing on a rib, causing discomfort.  He has numbness in one thumb.  No other neuropathy symptoms. ? ?Objective: ? ?Vital signs in last 24 hours: ? ?Blood pressure 122/75, pulse 78, temperature 98.2 ?F (36.8 ?C), temperature source Oral, resp. rate 18, height '5\' 8"'  (1.727 m), weight 159 lb 9.6 oz (72.4 kg), SpO2 100 %. ?  ? ?Resp: Lungs clear bilaterally. ?Cardio: Regular rate and rhythm. ?GI: No hepatomegaly.  Soft, reducible midline hernia. ?Vascular: No leg edema. ? ?Port-A-Cath without erythema. ? ?Lab Results: ? ?Lab Results  ?Component Value Date  ? WBC 4.2 04/19/2021  ? HGB 11.8 (L) 04/19/2021  ? HCT 35.4 (L) 04/19/2021  ? MCV 88.3 04/19/2021  ? PLT 86 (L) 04/19/2021  ? NEUTROABS 2.1 04/19/2021  ? ? ?Imaging: ? ?No results found. ? ?Medications: I have reviewed the patient's current medications. ? 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   Fat planes around the celiac axis preserved.  Less than 90 degree contact of the lateral wall of the SMA, no evidence of metastatic disease, 3 mm left lower lobe pulmonary nodule ?Cycle 1 FOLFOX 05/09/2020 (plan to add irinotecan with cycle 2 pending tolerance) ?Cycle 2 FOLFIRINOX 05/23/2020 ?Cycle 3 FOLFIRINOX 06/06/2020, oxaliplatin dose reduced secondary to thrombocytopenia ?Cycle 4 FOLFIRINOX 06/27/2020, oxaliplatin further dose reduced secondary to thrombocytopenia ?CTs at [facility] 07/03/2020-redemonstration of a heterogeneous pancreatic head mass with borderline encasement of the SMA as well as abutment of the portal vein, SMV and likely the IVC.  Similar mildly enlarged porta hepatis and pericaval lymph nodes.  No convincing evidence of distant metastatic disease. 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  More than 180 degree involvement of the SMV and SMA ?Cycle 1 gemcitabine/Abraxane 11/22/2020, [place]denyca ?Genetics counselor contacted him 11/26/2020-no pathogenic variants identified in the 84 genes analyzed.  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 ?Cycle 2 gemcitabine/Abraxane 12/06/2020, Udenyca ?Cycle 3 gemcitabine/Abraxane 12/19/2020, Udenyca ?Cycle 4 gemcitabine/Abraxane 01/03/2021, Udenyca ?Cycle 5 gemcitabine/Abraxane 01/17/2021, Udenyca ?Cycle 6 gemcitabine/Abraxane 01/31/2021, Udenyca ?CTs at [facility] 02/22/2021-similar pancreatic head mass  with involvement of SMA/SMV.  No evidence of distant metastatic disease. ?Referred to Dr. Lisbeth Renshaw 02/26/2021 ?Pancreas SBRT 03/20/2021-03/29/2021 ?05/30/2021 CTs-new ill-defined hypoenhancing lesions in the liver.  Similar appearance of the pancreas head mass. ?Obstructive jaundice secondary #1 ?ERCP 04/11/2020-prominent and floppy major papilla, dilated bile duct secondary to a distal stricture, placement of an uncovered metal stent, bile duct brushings-reactive glandular cells ?3.   Diabetes ?  ?4.   Hyperlipidemia ?  ?5.   Glaucoma ?  ?6.   Port-A-Cath placement 05/04/2020 Interventional Radiology ?  ?7.   Mild thrombocytopenia (121,000) 05/09/2020 ?  ? ?Disposition: Nathan Mora appears unchanged.  Recent CTs at [facility] showed new liver lesions, pancreas head mass stable.  We have requested the images.  We discussed beginning treatment with liposomal irinotecan/5-fluorouracil.  Potential toxicities reviewed.  We are requesting Guardant360 testing.  [name] prefers to have the Guardant360 testing available prior to beginning systemic therapy. ? ?He will begin a trial of Remeron 15 mg at bedtime.  We made a referral to Dr. Michail Sermon, psychology. ? ?He will return for a follow-up appointment in 2 weeks.  He will contact the office in the interim with any problems. ? ?Patient seen with Dr. Benay Spice. ? ? ?Limmie Schoenberg ANP/GNP-BC  ? ?06/04/2021  ?8:52 AM ?This was a shared visit with [name].  Nathan Mora was interviewed and examined.  I discussed the case with Dr. Zenia Resides. ?There is clinical and radiologic evidence of disease progression.  We discussed treatment options with Nathan Mora and his wife.  We discussed standard salvage therapy with 5-FU/liposomal irinotecan. ?We decided to submit peripheral blood for NGS testing.  He will begin a trial of Remeron for depression and anorexia. ? ?I was present for greater than 50% of today's visit.  I performed medical decision making. ? ?[name], MD ? ? ? ? ? ? ?

## 2021-06-05 ENCOUNTER — Other Ambulatory Visit: Payer: Self-pay | Admitting: *Deleted

## 2021-06-05 ENCOUNTER — Encounter: Payer: Self-pay | Admitting: Nurse Practitioner

## 2021-06-05 ENCOUNTER — Telehealth: Payer: Self-pay

## 2021-06-05 LAB — CANCER ANTIGEN 19-9: CA 19-9: 694 U/mL — ABNORMAL HIGH (ref 0–35)

## 2021-06-05 NOTE — Progress Notes (Unsigned)
Left VM with [facility] to f/u on referral order placed on 06/04/21. ?

## 2021-06-05 NOTE — Telephone Encounter (Signed)
I requested  a power share of the patient Ct dual pancreas incl Ct abd pel w/Ct chest w. Mips on 05/30/21. I faxed over the request to [facility] [employer]. ?

## 2021-06-06 ENCOUNTER — Other Ambulatory Visit: Payer: Self-pay | Admitting: Nurse Practitioner

## 2021-06-06 DIAGNOSIS — C250 Malignant neoplasm of head of pancreas: Secondary | ICD-10-CM

## 2021-06-06 MED ORDER — TRAMADOL HCL 50 MG PO TABS: 50.0000 mg | ORAL_TABLET | Freq: Three times a day (TID) | ORAL | 0 refills | Status: DC | PRN

## 2021-06-07 ENCOUNTER — Encounter: Payer: Self-pay | Admitting: Oncology

## 2021-06-11 DIAGNOSIS — C250 Malignant neoplasm of head of pancreas: Secondary | ICD-10-CM | POA: Diagnosis not present

## 2021-06-12 ENCOUNTER — Telehealth: Payer: Self-pay

## 2021-06-12 ENCOUNTER — Telehealth (INDEPENDENT_AMBULATORY_CARE_PROVIDER_SITE_OTHER): Payer: Medicare HMO | Admitting: Medical

## 2021-06-12 ENCOUNTER — Encounter: Payer: Self-pay | Admitting: *Deleted

## 2021-06-12 VITALS — HR 88 | Temp 98.2°F

## 2021-06-12 DIAGNOSIS — J029 Acute pharyngitis, unspecified: Secondary | ICD-10-CM

## 2021-06-12 DIAGNOSIS — U071 COVID-19: Secondary | ICD-10-CM | POA: Diagnosis not present

## 2021-06-12 LAB — GUARDANT 360

## 2021-06-12 MED ORDER — BENZONATATE 100 MG PO CAPS: 100.0000 mg | ORAL_CAPSULE | Freq: Three times a day (TID) | ORAL | 0 refills | Status: DC | PRN

## 2021-06-12 MED ORDER — NIRMATRELVIR/RITONAVIR (PAXLOVID)TABLET: 3.0000 | ORAL_TABLET | Freq: Two times a day (BID) | ORAL | 0 refills | Status: AC

## 2021-06-12 MED ORDER — FLUTICASONE PROPIONATE 50 MCG/ACT NA SUSP: 2.0000 | Freq: Every day | NASAL | 1 refills | Status: DC

## 2021-06-12 NOTE — Telephone Encounter (Signed)
Patient's wife called in stating that her husband is not feeling well, sore throat and fever. Advised wife that I did not have openings today and tried offering tomorrow but wife then states that he is a special patient and that Dr.Greene has to work him in today, due to his medical history. Advised that I would send a message and see what he advises.  ?

## 2021-06-12 NOTE — Telephone Encounter (Signed)
Patient's wife called back and stated he has tested positive for covid. Has a virtual this afternoon with Mackie Pai at [facility].  ?

## 2021-06-12 NOTE — Telephone Encounter (Signed)
Noted. Soory to hear that he tested positive for Covid, but glad he has an appt with Dr. Harvie Heck. I suspect antivirals will be discussed.  ?

## 2021-06-12 NOTE — Progress Notes (Signed)
Guardant 360 results to MD desk. ?

## 2021-06-12 NOTE — Patient Instructions (Addendum)
COVID infection with symptom onset today of sore throat.  Vaccinated x4 but no prior COVID infection.  COVID risk score of 5. ? ?Decided to prescribe Paxlovid after talking with our pharmacist.  Definitely stop atorvastatin while on Paxlovid.  Then restart in 7 days / 2 days after taking last dose of Paxlovid. ? ?No other significant interaction reported but pharmacist did mention with Paxlovid could potentiate blood pressure drop with Cardura.  So recommend checking blood pressure today to make sure blood pressure not less than 130/80.  If so then would hold Cardura over the next 7 days as well. ? ?Also prescribing Paxlovid computer program positive interaction with with revatio. So stop that as well over the next 7 days. ? ?Check O2 sats daily.  If O2 sats dropping less than 96% or having significant shortness of breath notify us.  If severe symptoms recommend ED evaluation. ? ?Getting any secondary sinus infection symptoms or bronchitis let us know.  In that event would prescribe antibiotic. ? ?Recommend that you follow-up in 7 days with Dr. Nyoka Cowden.  Sooner if needed.  Since you do have history of pancreatic cancer I do think follow-up visit with your PCP would be a good idea. ? ?To increase appetite can ask your oncologist opinion on marinol or periactin. ? ?I did try to call you 3 times today after talking with pharmacist to advise you regarding medication precautions and discontinuing atorvastatin presently but could not get through.  Answering machine only came on. ?

## 2021-06-12 NOTE — Telephone Encounter (Signed)
Please Advise

## 2021-06-12 NOTE — Progress Notes (Signed)
? ?  Subjective:  ? ? Patient ID: Nathan Mora, male    DOB: 1945/04/14, 76 y.o.   MRN: [id number] ? ?HPI ?Virtual Visit via Video Note ? ?I connected with Nathan Mora on 06/12/21 at  2:20 PM EDT by a video enabled telemedicine application and verified that I am speaking with the correct person using two identifiers. ? ?Location: ?Patient: home ?Provider: office ?  ?I discussed the limitations of evaluation and management by telemedicine and the availability of in person appointments. The patient expressed understanding and agreed to proceed. ? ?History of Present Illness: ?Pt recently diagnosed with covid today ? ?Test used-otc. Tested + today. ? ?History of covid vaccine x 4. ? ?Covid risk score- 5. ? ?History of covid infection-no ? ?Current symptoms- pt has sore throat since this morning.. States pain is moderate to severe pain swallowing. Pt has pancreatic cancer.  ? ?Pt 02 sat- asked to check but wife could not find the monitor. ? ?Asked her to get bp and pulse. Did not give. ?  ?One month poor appetite. Food taste like cardboard. Told by oncologist not uncommon. ? ?He played 9 holes of gold yesterday. ? ?A1c 6.9 2 months ago. ? ? ?Observations/Objective: ? ?General-no acute distress, pleasant, oriented. ?Lungs- on inspection lungs appear unlabored. ?Neck- no tracheal deviation or jvd on inspection. ?Neuro- gross motor function appears intact.  ? ? ?Assessment and Plan: ?Patient Instructions  ?COVID infection with symptom onset today of sore throat.  Vaccinated x4 but no prior COVID infection.  COVID risk score of 5. ? ?Decided to prescribe Paxlovid after talking with our pharmacist.  Definitely stop atorvastatin while on Paxlovid.  Then restart in 7 days / 2 days after taking last dose of Paxlovid. ? ?No other significant interaction reported but pharmacist did mention with Paxlovid could potentiate blood pressure drop with Cardura.  So recommend checking blood pressure today to make sure blood pressure not  less than 130/80.  If so then would hold Cardura over the next 7 days as well. ? ?Also prescribing Paxlovid computer program positive interaction with with revatio. So stop that as well over the next 7 days. ? ?Check O2 sats daily.  If O2 sats dropping less than 96% or having significant shortness of breath notify us.  If severe symptoms recommend ED evaluation. ? ?Getting any secondary sinus infection symptoms or bronchitis let us know.  In that event would prescribe antibiotic. ? ?Recommend that you follow-up in 7 days with Dr. Nyoka Cowden.  Sooner if needed.  Since you do have history of pancreatic cancer I do think follow-up visit with your PCP would be a good idea.  ? ?Mackie Pai, PA-C  ? ?Follow Up Instructions: ? ?  ?I discussed the assessment and treatment plan with the patient. The patient was provided an opportunity to ask questions and all were answered. The patient agreed with the plan and demonstrated an understanding of the instructions. ?  ?The patient was advised to call back or seek an in-person evaluation if the symptoms worsen or if the condition fails to improve as anticipated. ? ? ? ? ?Mackie Pai, PA-C  ? ? ?Review of Systems ? ?   ?Objective:  ? Physical Exam ? ? ? ? ?   ?Assessment & Plan:  ? ? ?

## 2021-06-12 NOTE — Telephone Encounter (Signed)
Just an Update on patient and he is being seen this afternoon ?

## 2021-06-13 ENCOUNTER — Encounter: Payer: Self-pay | Admitting: Nurse Practitioner

## 2021-06-18 ENCOUNTER — Inpatient Hospital Stay (HOSPITAL_BASED_OUTPATIENT_CLINIC_OR_DEPARTMENT_OTHER): Payer: Medicare HMO | Admitting: Nurse Practitioner

## 2021-06-18 ENCOUNTER — Other Ambulatory Visit: Payer: Self-pay | Admitting: Nurse Practitioner

## 2021-06-18 ENCOUNTER — Inpatient Hospital Stay: Payer: Medicare HMO

## 2021-06-18 ENCOUNTER — Encounter: Payer: Self-pay | Admitting: Nurse Practitioner

## 2021-06-18 VITALS — BP 123/74 | HR 76 | Temp 98.2°F | Resp 19

## 2021-06-18 VITALS — BP 98/66 | HR 87 | Temp 97.8°F | Resp 18 | Wt 156.0 lb

## 2021-06-18 DIAGNOSIS — E785 Hyperlipidemia, unspecified: Secondary | ICD-10-CM | POA: Diagnosis not present

## 2021-06-18 DIAGNOSIS — R69 Illness, unspecified: Secondary | ICD-10-CM | POA: Diagnosis not present

## 2021-06-18 DIAGNOSIS — C250 Malignant neoplasm of head of pancreas: Secondary | ICD-10-CM

## 2021-06-18 DIAGNOSIS — H409 Unspecified glaucoma: Secondary | ICD-10-CM | POA: Diagnosis not present

## 2021-06-18 DIAGNOSIS — Z95828 Presence of other vascular implants and grafts: Secondary | ICD-10-CM

## 2021-06-18 DIAGNOSIS — D696 Thrombocytopenia, unspecified: Secondary | ICD-10-CM | POA: Diagnosis not present

## 2021-06-18 DIAGNOSIS — Z923 Personal history of irradiation: Secondary | ICD-10-CM | POA: Diagnosis not present

## 2021-06-18 DIAGNOSIS — E119 Type 2 diabetes mellitus without complications: Secondary | ICD-10-CM | POA: Diagnosis not present

## 2021-06-18 DIAGNOSIS — K831 Obstruction of bile duct: Secondary | ICD-10-CM | POA: Diagnosis not present

## 2021-06-18 DIAGNOSIS — R630 Anorexia: Secondary | ICD-10-CM | POA: Diagnosis not present

## 2021-06-18 LAB — CMP (CANCER CENTER ONLY)
ALT: 13 U/L (ref 0–44)
AST: 11 U/L — ABNORMAL LOW (ref 15–41)
Albumin: 3.5 g/dL (ref 3.5–5.0)
Alkaline Phosphatase: 102 U/L (ref 38–126)
Anion gap: 10 (ref 5–15)
BUN: 14 mg/dL (ref 8–23)
CO2: 26 mmol/L (ref 22–32)
Calcium: 8.5 mg/dL — ABNORMAL LOW (ref 8.9–10.3)
Chloride: 95 mmol/L — ABNORMAL LOW (ref 98–111)
Creatinine: 0.67 mg/dL (ref 0.61–1.24)
GFR, Estimated: >60 mL/min (ref >60)
Glucose, Bld: 291 mg/dL — ABNORMAL HIGH (ref 70–99)
Potassium: 3.5 mmol/L (ref 3.5–5.1)
Sodium: 131 mmol/L — ABNORMAL LOW (ref 135–145)
Total Bilirubin: 0.4 mg/dL (ref 0.3–1.2)
Total Protein: 6.2 g/dL — ABNORMAL LOW (ref 6.5–8.1)

## 2021-06-18 LAB — CBC WITH DIFFERENTIAL (CANCER CENTER ONLY)
Abs Immature Granulocytes: 0.18 10*3/uL — ABNORMAL HIGH (ref 0.00–0.07)
Basophils Absolute: 0 10*3/uL (ref 0.0–0.1)
Basophils Relative: 0 %
Eosinophils Absolute: 0 10*3/uL (ref 0.0–0.5)
Eosinophils Relative: 0 %
HCT: 28.5 % — ABNORMAL LOW (ref 39.0–52.0)
Hemoglobin: 9.5 g/dL — ABNORMAL LOW (ref 13.0–17.0)
Immature Granulocytes: 2 %
Lymphocytes Relative: 10 %
Lymphs Abs: 1.3 10*3/uL (ref 0.7–4.0)
MCH: 28.8 pg (ref 26.0–34.0)
MCHC: 33.3 g/dL (ref 30.0–36.0)
MCV: 86.4 fL (ref 80.0–100.0)
Monocytes Absolute: 2.6 10*3/uL — ABNORMAL HIGH (ref 0.1–1.0)
Monocytes Relative: 21 %
Neutro Abs: 8.3 10*3/uL — ABNORMAL HIGH (ref 1.7–7.7)
Neutrophils Relative %: 67 %
Platelet Count: 151 10*3/uL (ref 150–400)
RBC: 3.3 MIL/uL — ABNORMAL LOW (ref 4.22–5.81)
RDW: 14 % (ref 11.5–15.5)
WBC Count: 12.4 10*3/uL — ABNORMAL HIGH (ref 4.0–10.5)
nRBC: 0 % (ref 0.0–0.2)

## 2021-06-18 MED ORDER — HEPARIN SOD (PORK) LOCK FLUSH 100 UNIT/ML IV SOLN
500.0000 [IU] | Freq: Once | INTRAVENOUS | Status: AC
  Administered 2021-06-18: 500 [IU] via INTRAVENOUS

## 2021-06-18 MED ORDER — SODIUM CHLORIDE 0.9 % IV SOLN: INTRAVENOUS | Status: AC

## 2021-06-18 MED ORDER — SODIUM CHLORIDE 0.9% FLUSH
10.0000 mL | Freq: Once | INTRAVENOUS | Status: AC
  Administered 2021-06-18: 10 mL via INTRAVENOUS

## 2021-06-18 MED ORDER — SODIUM CHLORIDE 0.9% FLUSH
10.0000 mL | INTRAVENOUS | Status: DC | PRN
  Administered 2021-06-18: 10 mL via INTRAVENOUS

## 2021-06-18 NOTE — Patient Instructions (Signed)
Rehydration, Adult Rehydration is the replacement of body fluids, salts, and minerals (electrolytes) that are lost during dehydration. Dehydration is when there is not enough water or other fluids in the body. This happens when you lose more fluids than you take in. Common causes of dehydration include: Not drinking enough fluids. This can occur when you are ill or doing activities that require a lot of energy, especially in hot weather. Conditions that cause loss of water or other fluids, such as diarrhea, vomiting, sweating, or urinating a lot. Other illnesses, such as fever or infection. Certain medicines, such as those that remove excess fluid from the body (diuretics). Symptoms of mild or moderate dehydration may include thirst, dry lips and mouth, and dizziness. Symptoms of severe dehydration may include increased heart rate, confusion, fainting, and not urinating. For severe dehydration, you may need to get fluids through an IV at the hospital. For mild or moderate dehydration, you can usually rehydrate at home by drinking certain fluids as told by your health care provider. What are the risks? Generally, rehydration is safe. However, taking in too much fluid (overhydration) can be a problem. This is rare. Overhydration can cause an electrolyte imbalance, kidney failure, or a decrease in salt (sodium) levels in the body. Supplies needed You will need an oral rehydration solution (ORS) if your health care provider tells you to use one. This is a drink to treat dehydration. It can be found in pharmacies and retail stores. How to rehydrate Fluids Follow instructions from your health care provider for rehydration. The kind of fluid and the amount you should drink depend on your condition. In general, you should choose drinks that you prefer. If told by your health care provider, drink an ORS. Make an ORS by following instructions on the package. Start by drinking small amounts, about  cup (120  mL) every 5-10 minutes. Slowly increase how much you drink until you have taken the amount recommended by your health care provider. Drink enough clear fluids to keep your urine pale yellow. If you were told to drink an ORS, finish it first, then start slowly drinking other clear fluids. Drink fluids such as: Water. This includes sparkling water and flavored water. Drinking only water can lead to having too little sodium in your body (hyponatremia). Follow the advice of your health care provider. Water from ice chips you suck on. Fruit juice with water you add to it (diluted). Sports drinks. Hot or cold herbal teas. Broth-based soups. Milk or milk products. Food Follow instructions from your health care provider about what to eat while you rehydrate. Your health care provider may recommend that you slowly begin eating regular foods in small amounts. Eat foods that contain a healthy balance of electrolytes, such as bananas, oranges, potatoes, tomatoes, and spinach. Avoid foods that are greasy or contain a lot of sugar. In some cases, you may get nutrition through a feeding tube that is passed through your nose and into your stomach (nasogastric tube, or NG tube). This may be done if you have uncontrolled vomiting or diarrhea. Beverages to avoid  Certain beverages may make dehydration worse. While you rehydrate, avoid drinking alcohol. How to tell if you are recovering from dehydration You may be recovering from dehydration if: You are urinating more often than before you started rehydrating. Your urine is pale yellow. Your energy level improves. You vomit less frequently. You have diarrhea less frequently. Your appetite improves or returns to normal. You feel less dizzy or less light-headed.   Your skin tone and color start to look more normal. Follow these instructions at home: Take over-the-counter and prescription medicines only as told by your health care provider. Do not take sodium  tablets. Doing this can lead to having too much sodium in your body (hypernatremia). Contact a health care provider if: You continue to have symptoms of mild or moderate dehydration, such as: Thirst. Dry lips. Slightly dry mouth. Dizziness. Dark urine or less urine than normal. Muscle cramps. You continue to vomit or have diarrhea. Get help right away if you: Have symptoms of dehydration that get worse. Have a fever. Have a severe headache. Have been vomiting and the following happens: Your vomiting gets worse or does not go away. Your vomit includes blood or green matter (bile). You cannot eat or drink without vomiting. Have problems with urination or bowel movements, such as: Diarrhea that gets worse or does not go away. Blood in your stool (feces). This may cause stool to look black and tarry. Not urinating, or urinating only a small amount of very dark urine, within 6-8 hours. Have trouble breathing. Have symptoms that get worse with treatment. These symptoms may represent a serious problem that is an emergency. Do not wait to see if the symptoms will go away. Get medical help right away. Call your local emergency services (911 in the [place] Do not drive yourself to the hospital. Summary Rehydration is the replacement of body fluids and minerals (electrolytes) that are lost during dehydration. Follow instructions from your health care provider for rehydration. The kind of fluid and amount you should drink depend on your condition. Slowly increase how much you drink until you have taken the amount recommended by your health care provider. Contact your health care provider if you continue to show signs of mild or moderate dehydration. This information is not intended to replace advice given to you by your health care provider. Make sure you discuss any questions you have with your health care provider. Document Revised: 03/16/2019 Document Reviewed: 01/24/2019 Elsevier Patient  Education  2023 [employer] Inc.  

## 2021-06-18 NOTE — Progress Notes (Signed)
DISCONTINUE ON PATHWAY REGIMEN - Pancreatic Adenocarcinoma     A cycle is every 28 days:     Nab-paclitaxel (protein bound)      Gemcitabine   **Always confirm dose/schedule in your pharmacy ordering system**  REASON: Disease Progression PRIOR TREATMENT: PANOS51: Nab-Paclitaxel (Abraxane) 125 mg/m2 D1, 8, 15 + Gemcitabine 1,000 mg/m2 D1, 8, 15 q28 Days Until Progression or Toxicity TREATMENT RESPONSE: Progressive Disease (PD)  START OFF PATHWAY REGIMEN - Pancreatic Adenocarcinoma   [id number]:Fluorouracil 1,200 mg/m2/day CIV D1-2 + Leucovorin 400 mg/m2 IV D1 + Liposomal Irinotecan 70 mg/m2 IV D1 q14 Days:   A cycle is every 14 days:     Liposomal irinotecan      Leucovorin      Fluorouracil   **Always confirm dose/schedule in your pharmacy ordering system**  Patient Characteristics: Metastatic Disease, Third Line and Beyond, MSS/pMMR or MSI Unknown Therapeutic Status: Metastatic Disease Line of Therapy: Third Engineer, civil (consulting) Status: MSS/pMMR Intent of Therapy: Non-Curative / Palliative Intent, Discussed with Patient

## 2021-06-18 NOTE — Progress Notes (Signed)
[facility] OFFICE PROGRESS NOTE   Diagnosis: Pancreas cancer  INTERVAL HISTORY:   Nathan Mora returns for follow-up.  He tested positive for COVID 06/12/2021.  He was prescribed Paxlovid.  He reports having a sore throat.  No fever.  No shortness of breath.  No cough.  He completed the Paxlovid yesterday.  He feels very weak.  He is thirsty.  He reports blood sugar has been well controlled.  Objective:  Vital signs in last 24 hours:  Blood pressure 98/66, pulse 87, temperature 97.8 F (36.6 C), temperature source Tympanic, resp. rate 18, weight 156 lb (70.8 kg), SpO2 99 %.    HEENT: Mouth appears dry. Resp: Lungs clear bilaterally. Cardio: Regular rate and rhythm. GI: Soft reducible midline hernia. Vascular: No leg edema. Skin: Decreased skin turgor. Port-A-Cath without erythema.   Lab Results:  Lab Results  Component Value Date   WBC 7.0 06/04/2021   HGB 11.3 (L) 06/04/2021   HCT 34.4 (L) 06/04/2021   MCV 85.1 06/04/2021   PLT 163 06/04/2021   NEUTROABS 4.3 06/04/2021    Imaging:  No results found.  Medications: I have reviewed the patient's current medications.  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  Fat planes around the celiac axis preserved.  Less than 90 degree contact of the lateral wall of the SMA, no evidence of metastatic disease, 3 mm left lower lobe  pulmonary nodule Cycle 1 FOLFOX 05/09/2020 (plan to add irinotecan with cycle 2 pending tolerance) Cycle 2 FOLFIRINOX 05/23/2020 Cycle 3 FOLFIRINOX 06/06/2020, oxaliplatin dose reduced secondary to thrombocytopenia Cycle 4 FOLFIRINOX 06/27/2020, oxaliplatin further dose reduced secondary to thrombocytopenia CTs at [facility] 07/03/2020-redemonstration of a heterogeneous pancreatic head mass with borderline encasement of the SMA as well as abutment of the portal vein, SMV and likely the IVC.  Similar mildly enlarged porta hepatis and pericaval lymph nodes.  No convincing evidence of distant metastatic disease. Cycle 5 FOLFIRINOX 07/12/2020 Cycle 6 FOLFIRINOX 07/26/2020 Cycle 7 FOLFIRINOX 08/08/2020 Cycle 8 FOLFIRINOX 08/23/2020 10/05/2020-exploratory laparotomy, cholecystectomy, intraoperative ultrasound, excisional biopsy of retroperitoneal tissue-pancreas mass deemed unresectable secondary to involvement of the origin of the left renal vein, portal vein, and SMA, biopsy of left renal vein tissue was positive for carcinoma, no loss of mismatch repair protein expression Pancreas protocol CT at [facility] 11/12/2020-increase in size of ill-defined pancreas head mass with abutment of the IVC and renal vein.  More than 180 degree involvement of the SMV and SMA Cycle 1 gemcitabine/Abraxane 11/22/2020, [place] counselor contacted him 11/26/2020-no pathogenic variants identified in the 84 genes analyzed.  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 Cycle 2 gemcitabine/Abraxane 12/06/2020, Udenyca Cycle 3 gemcitabine/Abraxane 12/19/2020, Udenyca Cycle 4 gemcitabine/Abraxane 01/03/2021, Udenyca Cycle 5 gemcitabine/Abraxane 01/17/2021, Udenyca Cycle 6 gemcitabine/Abraxane 01/31/2021, Udenyca CTs at [place] 02/22/2021-similar pancreatic head mass with involvement of SMA/SMV.  No evidence of distant metastatic disease. Referred to Dr. Lisbeth Renshaw 02/26/2021 Pancreas SBRT 03/20/2021-03/29/2021 05/30/2021 CTs-new  ill-defined hypoenhancing lesions in the liver.  Similar appearance of  the pancreas head mass. Guardant360 testing 06/04/2021-MSI high not detected Obstructive jaundice secondary #1 ERCP 04/11/2020-prominent and floppy major papilla, dilated bile duct secondary to a distal stricture, placement of an uncovered metal stent, bile duct brushings-reactive glandular cells 3.   Diabetes   4.   Hyperlipidemia   5.   Glaucoma   6.   Port-A-Cath placement 05/04/2020 Interventional Radiology   7.   Mild thrombocytopenia (121,000) 05/09/2020  8.  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  CTs from earlier this month showed new liver lesions.  Images reviewed with Nathan Mora and his wife at today's visit.  We reviewed options to include supportive care versus systemic therapy with liposomal irinotecan/5-fluorouracil.  Potential toxicities were reviewed.  He would like to proceed with chemotherapy.  He was recently diagnosed with COVID.  He is very weak at today's visit.  He appears dehydrated.  Symptoms could be due to COVID versus metastatic pancreas cancer.  We are obtaining labs to include a CBC and chemistry panel and will administer IV fluids.  We discussed a start date for chemotherapy.  He would like to return to begin treatment on 06/28/2021.  We will see him in follow-up prior to cycle 2 on 07/12/2021.  He will contact the office in the interim with any problems.  Patient seen with Dr. Benay Spice.    Xzavier Swinger ANP/GNP-BC   06/18/2021  8:59 AM  This was a shared visit with [name].  Nathan Mora was interviewed and examined.  We reviewed the [facility] CT images and treatment options with Nathan Mora and his wife.  We reviewed results of the Guardant 360 testing he understands no therapy will be curative.  We discussed comfort care versus a trial of salvage systemic therapy.  He understands the small expected clinical response rate associated with  additional chemotherapy.  We specifically discussed 5-FU and liposomal irinotecan.  We reviewed potential toxicities associated with this regimen.  He would like to proceed with 5-FU/liposomal irinotecan.  He was recently diagnosed with COVID-19 and remains in a weakened condition.  The plan is to begin 5-FU-liposomal irinotecan on 06/28/2021. A chemotherapy plan was entered today.   I was present for greater than 50% of today's visit.  I performed medical decision making.    [name], MD

## 2021-06-19 ENCOUNTER — Other Ambulatory Visit: Payer: Self-pay | Admitting: Nurse Practitioner

## 2021-06-19 DIAGNOSIS — C250 Malignant neoplasm of head of pancreas: Secondary | ICD-10-CM

## 2021-06-19 MED ORDER — TRAMADOL HCL 50 MG PO TABS: 50.0000 mg | ORAL_TABLET | Freq: Three times a day (TID) | ORAL | 0 refills | Status: DC | PRN

## 2021-06-20 ENCOUNTER — Inpatient Hospital Stay: Payer: Medicare HMO | Admitting: Oncology

## 2021-06-20 ENCOUNTER — Inpatient Hospital Stay: Payer: Medicare HMO

## 2021-06-23 ENCOUNTER — Other Ambulatory Visit: Payer: Self-pay | Admitting: Oncology

## 2021-06-26 DIAGNOSIS — E785 Hyperlipidemia, unspecified: Secondary | ICD-10-CM | POA: Diagnosis not present

## 2021-06-26 DIAGNOSIS — C250 Malignant neoplasm of head of pancreas: Secondary | ICD-10-CM

## 2021-06-28 ENCOUNTER — Inpatient Hospital Stay: Payer: Medicare HMO

## 2021-06-28 ENCOUNTER — Encounter: Payer: Self-pay | Admitting: Nurse Practitioner

## 2021-06-28 ENCOUNTER — Inpatient Hospital Stay (HOSPITAL_BASED_OUTPATIENT_CLINIC_OR_DEPARTMENT_OTHER): Payer: Medicare HMO | Admitting: Nurse Practitioner

## 2021-06-28 ENCOUNTER — Inpatient Hospital Stay: Payer: Medicare HMO | Attending: Oncology

## 2021-06-28 ENCOUNTER — Other Ambulatory Visit: Payer: Self-pay

## 2021-06-28 VITALS — BP 108/68 | HR 78 | Temp 98.1°F | Resp 18 | Ht 68.0 in | Wt 152.4 lb

## 2021-06-28 DIAGNOSIS — C250 Malignant neoplasm of head of pancreas: Secondary | ICD-10-CM | POA: Insufficient documentation

## 2021-06-28 DIAGNOSIS — Z1379 Encounter for other screening for genetic and chromosomal anomalies: Secondary | ICD-10-CM

## 2021-06-28 DIAGNOSIS — R110 Nausea: Secondary | ICD-10-CM | POA: Diagnosis not present

## 2021-06-28 DIAGNOSIS — R531 Weakness: Secondary | ICD-10-CM | POA: Diagnosis not present

## 2021-06-28 DIAGNOSIS — R1030 Lower abdominal pain, unspecified: Secondary | ICD-10-CM | POA: Diagnosis not present

## 2021-06-28 DIAGNOSIS — E860 Dehydration: Secondary | ICD-10-CM

## 2021-06-28 DIAGNOSIS — K5900 Constipation, unspecified: Secondary | ICD-10-CM | POA: Diagnosis not present

## 2021-06-28 DIAGNOSIS — H409 Unspecified glaucoma: Secondary | ICD-10-CM | POA: Insufficient documentation

## 2021-06-28 DIAGNOSIS — E785 Hyperlipidemia, unspecified: Secondary | ICD-10-CM | POA: Insufficient documentation

## 2021-06-28 DIAGNOSIS — K831 Obstruction of bile duct: Secondary | ICD-10-CM | POA: Insufficient documentation

## 2021-06-28 DIAGNOSIS — Z923 Personal history of irradiation: Secondary | ICD-10-CM | POA: Insufficient documentation

## 2021-06-28 DIAGNOSIS — R630 Anorexia: Secondary | ICD-10-CM | POA: Diagnosis not present

## 2021-06-28 DIAGNOSIS — D696 Thrombocytopenia, unspecified: Secondary | ICD-10-CM | POA: Diagnosis not present

## 2021-06-28 DIAGNOSIS — E119 Type 2 diabetes mellitus without complications: Secondary | ICD-10-CM | POA: Diagnosis not present

## 2021-06-28 LAB — SAMPLE TO BLOOD BANK

## 2021-06-28 LAB — CBC WITH DIFFERENTIAL (CANCER CENTER ONLY)
Abs Immature Granulocytes: 0.08 10*3/uL — ABNORMAL HIGH (ref 0.00–0.07)
Basophils Absolute: 0 10*3/uL (ref 0.0–0.1)
Basophils Relative: 0 %
Eosinophils Absolute: 0 10*3/uL (ref 0.0–0.5)
Eosinophils Relative: 0 %
HCT: 29.8 % — ABNORMAL LOW (ref 39.0–52.0)
Hemoglobin: 9.7 g/dL — ABNORMAL LOW (ref 13.0–17.0)
Immature Granulocytes: 1 %
Lymphocytes Relative: 14 %
Lymphs Abs: 1.4 10*3/uL (ref 0.7–4.0)
MCH: 28.4 pg (ref 26.0–34.0)
MCHC: 32.6 g/dL (ref 30.0–36.0)
MCV: 87.1 fL (ref 80.0–100.0)
Monocytes Absolute: 1.9 10*3/uL — ABNORMAL HIGH (ref 0.1–1.0)
Monocytes Relative: 18 %
Neutro Abs: 7 10*3/uL (ref 1.7–7.7)
Neutrophils Relative %: 67 %
Platelet Count: 144 10*3/uL — ABNORMAL LOW (ref 150–400)
RBC: 3.42 MIL/uL — ABNORMAL LOW (ref 4.22–5.81)
RDW: 13.9 % (ref 11.5–15.5)
WBC Count: 10.5 10*3/uL (ref 4.0–10.5)
nRBC: 0 % (ref 0.0–0.2)

## 2021-06-28 LAB — CMP (CANCER CENTER ONLY)
ALT: 10 U/L (ref 0–44)
AST: 9 U/L — ABNORMAL LOW (ref 15–41)
Albumin: 3.6 g/dL (ref 3.5–5.0)
Alkaline Phosphatase: 89 U/L (ref 38–126)
Anion gap: 10 (ref 5–15)
BUN: 12 mg/dL (ref 8–23)
CO2: 27 mmol/L (ref 22–32)
Calcium: 9 mg/dL (ref 8.9–10.3)
Chloride: 95 mmol/L — ABNORMAL LOW (ref 98–111)
Creatinine: 0.68 mg/dL (ref 0.61–1.24)
GFR, Estimated: >60 mL/min (ref >60)
Glucose, Bld: 304 mg/dL — ABNORMAL HIGH (ref 70–99)
Potassium: 3.8 mmol/L (ref 3.5–5.1)
Sodium: 132 mmol/L — ABNORMAL LOW (ref 135–145)
Total Bilirubin: 0.5 mg/dL (ref 0.3–1.2)
Total Protein: 6.7 g/dL (ref 6.5–8.1)

## 2021-06-28 MED ORDER — SORBITOL 70 % SOLN: 0 refills | Status: DC

## 2021-06-28 MED ORDER — HEPARIN SOD (PORK) LOCK FLUSH 100 UNIT/ML IV SOLN
500.0000 [IU] | Freq: Once | INTRAVENOUS | Status: AC | PRN
  Administered 2021-06-28: 500 [IU]

## 2021-06-28 MED ORDER — SODIUM CHLORIDE 0.9% FLUSH
10.0000 mL | INTRAVENOUS | Status: DC | PRN
  Administered 2021-06-28: 10 mL

## 2021-06-28 MED ORDER — SODIUM CHLORIDE 0.9 % IV SOLN: INTRAVENOUS | Status: DC

## 2021-06-28 MED ORDER — MEGESTROL ACETATE 400 MG/10ML PO SUSP: 200.0000 mg | Freq: Two times a day (BID) | ORAL | 2 refills | Status: DC

## 2021-06-28 NOTE — Progress Notes (Signed)
[facility] OFFICE PROGRESS NOTE   Diagnosis: Pancreas cancer  INTERVAL HISTORY:   Nathan Mora presents to the office today to proceed with cycle 1 liposomal irinotecan/5-FU.  He requested evaluation prior to making a decision about treatment today.  He feels weak.  Appetite is poor.  He is having issues with constipation.  He has occasional mild nausea.  He denies pain.  Objective:  Vital signs in last 24 hours:  Temperature 98.1, heart rate 82, respirations 18, blood pressure 105/77, oxygen saturation 100%, weight 152 pounds    HEENT: No thrush.  Tongue appears moist.  Conjunctival pallor. Resp: Lungs clear bilaterally. Cardio: Regular rate and rhythm. GI: Abdomen soft and nontender. Vascular: No leg edema. Neuro: Alert and oriented. Skin: Skin turgor intact. Port-A-Cath without erythema.   Lab Results:  Lab Results  Component Value Date   WBC 10.5 06/28/2021   HGB 9.7 (L) 06/28/2021   HCT 29.8 (L) 06/28/2021   MCV 87.1 06/28/2021   PLT 144 (L) 06/28/2021   NEUTROABS 7.0 06/28/2021    Imaging:  No results found.  Medications: I have reviewed the patient's current medications.  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  Fat planes around the celiac axis preserved.  Less than 90 degree contact of the lateral wall of the SMA, no evidence of metastatic disease, 3 mm  left lower lobe pulmonary nodule Cycle 1 FOLFOX 05/09/2020 (plan to add irinotecan with cycle 2 pending tolerance) Cycle 2 FOLFIRINOX 05/23/2020 Cycle 3 FOLFIRINOX 06/06/2020, oxaliplatin dose reduced secondary to thrombocytopenia Cycle 4 FOLFIRINOX 06/27/2020, oxaliplatin further dose reduced secondary to thrombocytopenia CTs at [facility] 07/03/2020-redemonstration of a heterogeneous pancreatic head mass with borderline encasement of the SMA as well as abutment of the portal vein, SMV and likely the IVC.  Similar mildly enlarged porta hepatis and pericaval lymph nodes.  No convincing evidence of distant metastatic disease. Cycle 5 FOLFIRINOX 07/12/2020 Cycle 6 FOLFIRINOX 07/26/2020 Cycle 7 FOLFIRINOX 08/08/2020 Cycle 8 FOLFIRINOX 08/23/2020 10/05/2020-exploratory laparotomy, cholecystectomy, intraoperative ultrasound, excisional biopsy of retroperitoneal tissue-pancreas mass deemed unresectable secondary to involvement of the origin of the left renal vein, portal vein, and SMA, biopsy of left renal vein tissue was positive for carcinoma, no loss of mismatch repair protein expression Pancreas protocol CT at [facility] 11/12/2020-increase in size of ill-defined pancreas head mass with abutment of the IVC and renal vein.  More than 180 degree involvement of the SMV and SMA Cycle 1 gemcitabine/Abraxane 11/22/2020, [place] counselor contacted him 11/26/2020-no pathogenic variants identified in the 84 genes analyzed.  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 Cycle 2 gemcitabine/Abraxane 12/06/2020, Udenyca Cycle 3 gemcitabine/Abraxane 12/19/2020, Udenyca Cycle 4 gemcitabine/Abraxane 01/03/2021, Udenyca Cycle 5 gemcitabine/Abraxane 01/17/2021, Udenyca Cycle 6 gemcitabine/Abraxane 01/31/2021, Udenyca CTs at [facility] 02/22/2021-similar pancreatic head mass with involvement of SMA/SMV.  No evidence of distant metastatic disease. Referred to Dr. Lisbeth Renshaw 02/26/2021 Pancreas SBRT  03/20/2021-03/29/2021 05/30/2021 CTs-new ill-defined hypoenhancing lesions in the liver.  Similar appearance of the pancreas head mass. [id number]  testing 06/04/2021-MSI high not detected Obstructive jaundice secondary #1 ERCP 04/11/2020-prominent and floppy major papilla, dilated bile duct secondary to a distal stricture, placement of an uncovered metal stent, bile duct brushings-reactive glandular cells 3.   Diabetes   4.   Hyperlipidemia   5.   Glaucoma   6.   Port-A-Cath placement 05/04/2020 Interventional Radiology   7.   Mild thrombocytopenia (121,000) 05/09/2020   8.  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  He is scheduled to begin salvage systemic therapy today with liposomal irinotecan/5-FU.  He continues to have anorexia, fatigue/malaise and now issues with constipation.  We discussed the likelihood that the anorexia and fatigue are related to cancer.  He understands this but would like to cancel chemo today and reschedule for next week with the plan to work on constipation in the meantime.  We discussed an appetite stimulant.  He has tried Remeron in the past but tolerated poorly.  I told him I do not recommend prednisone due to diabetes.  He would like to try Megace.  We discussed the increased risk of blood clots with Megace.  He agrees to proceed.  For the constipation he will try sorbitol.  He will receive a liter of IV fluids today and we are rescheduling chemotherapy for next week.    Nathan Mora ANP/GNP-BC   06/28/2021  10:31 AM

## 2021-06-28 NOTE — Progress Notes (Signed)
Per [name], NP, no tx today. Pt declined

## 2021-06-28 NOTE — Progress Notes (Signed)
Encounter opened in error

## 2021-06-30 LAB — CANCER ANTIGEN 19-9: CA 19-9: 1240 U/mL — ABNORMAL HIGH (ref 0–35)

## 2021-07-01 ENCOUNTER — Other Ambulatory Visit: Payer: Self-pay | Admitting: *Deleted

## 2021-07-01 DIAGNOSIS — C250 Malignant neoplasm of head of pancreas: Secondary | ICD-10-CM

## 2021-07-02 ENCOUNTER — Ambulatory Visit: Payer: Medicare HMO | Admitting: Psychologist

## 2021-07-02 ENCOUNTER — Encounter: Payer: Self-pay | Admitting: Nurse Practitioner

## 2021-07-03 ENCOUNTER — Telehealth: Payer: Self-pay | Admitting: *Deleted

## 2021-07-03 NOTE — Telephone Encounter (Signed)
Mrs. Shedden left MyChart message regarding patient's decline in status. He has been sleeping more; very weak and unsteady on his feet; too weak to get up from his recliner unassisted. Poor po intake: ate cream of wheat, cereal w/fruit, 1 chicken nugget, rice pudding all day yesterday. Drinks ~ 60 oz Gatoraid/day and then some juice. Getting more confused and voided in floor in bathroom last night. Abdominal pain not well controlled with Tramadol every 8 hours. No BM since Saturday despite MiraLax--will resume Sorbitol today (pharmacy was out), but he says he does not feel constipated.  Asking if he should be looking into Palliative or Hospice care. Feels he is too weak for chemo on 6/9. She also thought NP tried to reach her recently to discuss conversation she had with him in infusion room when he was getting IV fluids. Thinks they may be able to wait till Friday appointment, but she will see how he looks when she gets home and call back. After this afternoon she will be staying with him 24/7.

## 2021-07-03 NOTE — Telephone Encounter (Signed)
Spoke with Mrs. Mundt that he can be seen tomorrow at [place] by [name], NP. She will discuss w/[facility] when he awakens from his nap and call back by 3 pm with his decision.

## 2021-07-03 NOTE — Telephone Encounter (Signed)
Nathan Mora would like to come tomorrow at 0815.

## 2021-07-04 ENCOUNTER — Encounter: Payer: Self-pay | Admitting: Nurse Practitioner

## 2021-07-04 ENCOUNTER — Inpatient Hospital Stay (HOSPITAL_BASED_OUTPATIENT_CLINIC_OR_DEPARTMENT_OTHER): Payer: Medicare HMO | Admitting: Nurse Practitioner

## 2021-07-04 ENCOUNTER — Telehealth: Payer: Self-pay

## 2021-07-04 VITALS — BP 105/74 | HR 94 | Temp 98.1°F | Resp 18 | Wt 151.4 lb

## 2021-07-04 DIAGNOSIS — C250 Malignant neoplasm of head of pancreas: Secondary | ICD-10-CM | POA: Diagnosis not present

## 2021-07-04 DIAGNOSIS — D696 Thrombocytopenia, unspecified: Secondary | ICD-10-CM | POA: Diagnosis not present

## 2021-07-04 DIAGNOSIS — K831 Obstruction of bile duct: Secondary | ICD-10-CM | POA: Diagnosis not present

## 2021-07-04 DIAGNOSIS — E119 Type 2 diabetes mellitus without complications: Secondary | ICD-10-CM | POA: Diagnosis not present

## 2021-07-04 DIAGNOSIS — R630 Anorexia: Secondary | ICD-10-CM | POA: Diagnosis not present

## 2021-07-04 DIAGNOSIS — R110 Nausea: Secondary | ICD-10-CM | POA: Diagnosis not present

## 2021-07-04 DIAGNOSIS — E785 Hyperlipidemia, unspecified: Secondary | ICD-10-CM | POA: Diagnosis not present

## 2021-07-04 DIAGNOSIS — R531 Weakness: Secondary | ICD-10-CM | POA: Diagnosis not present

## 2021-07-04 DIAGNOSIS — R1030 Lower abdominal pain, unspecified: Secondary | ICD-10-CM | POA: Diagnosis not present

## 2021-07-04 DIAGNOSIS — Z923 Personal history of irradiation: Secondary | ICD-10-CM | POA: Diagnosis not present

## 2021-07-04 DIAGNOSIS — H409 Unspecified glaucoma: Secondary | ICD-10-CM | POA: Diagnosis not present

## 2021-07-04 DIAGNOSIS — K5900 Constipation, unspecified: Secondary | ICD-10-CM | POA: Diagnosis not present

## 2021-07-04 MED ORDER — TRAMADOL HCL 50 MG PO TABS: 50.0000 mg | ORAL_TABLET | Freq: Three times a day (TID) | ORAL | 0 refills | Status: DC | PRN

## 2021-07-04 MED ORDER — LORAZEPAM 0.5 MG PO TABS: 0.5000 mg | ORAL_TABLET | Freq: Three times a day (TID) | ORAL | 0 refills | Status: DC | PRN

## 2021-07-04 NOTE — Telephone Encounter (Signed)
Order was placed for hospice

## 2021-07-04 NOTE — Progress Notes (Signed)
[place] OFFICE PROGRESS NOTE   Diagnosis: Pancreas cancer  INTERVAL HISTORY:   Mr. Barbary returns prior to scheduled follow-up.  When he was here for chemotherapy last week he was not feeling well and declined to proceed with treatment.  Poor energy and poor appetite.  He is weak.  Intermittent pain low abdomen.  He is taking Ultram about every 8 hours.  For constipation he is taking sorbitol.  Mild intermittent nausea.  Objective:  Vital signs in last 24 hours:  Blood pressure 105/74, pulse 94, temperature 98.1 F (36.7 C), temperature source Tympanic, resp. rate 18, weight 151 lb 6.4 oz (68.7 kg), SpO2 100 %.    HEENT: No thrush or ulcers. Resp: Lungs clear bilaterally. Cardio: Regular rate and rhythm. GI: Abdomen soft and nontender. Vascular: No leg edema. Neuro: Alert and oriented. Skin: Decreased skin turgor. Port-A-Cath without erythema.   Lab Results:  Lab Results  Component Value Date   WBC 10.5 06/28/2021   HGB 9.7 (L) 06/28/2021   HCT 29.8 (L) 06/28/2021   MCV 87.1 06/28/2021   PLT 144 (L) 06/28/2021   NEUTROABS 7.0 06/28/2021    Imaging:  No results found.  Medications: I have reviewed the patient's current medications.  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  Fat planes around the celiac axis preserved.  Less than 90 degree contact of the  lateral wall of the SMA, no evidence of metastatic disease, 3 mm left lower lobe pulmonary nodule Cycle 1 FOLFOX 05/09/2020 (plan to add irinotecan with cycle 2 pending tolerance) Cycle 2 FOLFIRINOX 05/23/2020 Cycle 3 FOLFIRINOX 06/06/2020, oxaliplatin dose reduced secondary to thrombocytopenia Cycle 4 FOLFIRINOX 06/27/2020, oxaliplatin further dose reduced secondary to thrombocytopenia CTs at [facility] 07/03/2020-redemonstration of a heterogeneous pancreatic head mass with borderline encasement of the SMA as well as abutment of the portal vein, SMV and likely the IVC.  Similar mildly enlarged porta hepatis and pericaval lymph nodes.  No convincing evidence of distant metastatic disease. Cycle 5 FOLFIRINOX 07/12/2020 Cycle 6 FOLFIRINOX 07/26/2020 Cycle 7 FOLFIRINOX 08/08/2020 Cycle 8 FOLFIRINOX 08/23/2020 10/05/2020-exploratory laparotomy, cholecystectomy, intraoperative ultrasound, excisional biopsy of retroperitoneal tissue-pancreas mass deemed unresectable secondary to involvement of the origin of the left renal vein, portal vein, and SMA, biopsy of left renal vein tissue was positive for carcinoma, no loss of mismatch repair protein expression Pancreas protocol CT at [facility] 11/12/2020-increase in size of ill-defined pancreas head mass with abutment of the IVC and renal vein.  More than 180 degree involvement of the SMV and SMA Cycle 1 gemcitabine/Abraxane 11/22/2020, [place] counselor contacted him 11/26/2020-no pathogenic variants identified in the 84 genes analyzed.  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 Cycle 2 gemcitabine/Abraxane 12/06/2020, Udenyca Cycle 3 gemcitabine/Abraxane 12/19/2020, Udenyca Cycle 4 gemcitabine/Abraxane 01/03/2021, Udenyca Cycle 5 gemcitabine/Abraxane 01/17/2021, Udenyca Cycle 6 gemcitabine/Abraxane 01/31/2021, Udenyca CTs at [facility] 02/22/2021-similar pancreatic head mass with involvement of SMA/SMV.  No evidence of distant metastatic disease. Referred  to Dr. Lisbeth Renshaw 02/26/2021 Pancreas SBRT 03/20/2021-03/29/2021 05/30/2021  CTs-new ill-defined hypoenhancing lesions in the liver.  Similar appearance of the pancreas head mass. [employer] testing 06/04/2021-MSI high not detected Obstructive jaundice secondary #1 ERCP 04/11/2020-prominent and floppy major papilla, dilated bile duct secondary to a distal stricture, placement of an uncovered metal stent, bile duct brushings-reactive glandular cells 3.   Diabetes   4.   Hyperlipidemia   5.   Glaucoma   6.   Port-A-Cath placement 05/04/2020 Interventional Radiology   7.   Mild thrombocytopenia (121,000) 05/09/2020   8.  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  His performance status is declining.  He was scheduled to begin salvage systemic therapy with liposomal irinotecan/5-FU last week.  He has decided to focus on comfort/quality of life.  We discussed a hospice referral.  He is in agreement.  He confirmed No Code Blue status.  He will continue tramadol for pain control.  He will return for follow-up in 3 to 4 weeks.  We are available to see him sooner if needed.  Patient seen with Dr. Benay Spice.    Falcon Mccaskey ANP/GNP-BC   07/04/2021  8:42 AM This was a shared visit with [name].  Mr. Vanover is interviewed and examined.  His performance status has declined over the past several weeks.  He does not appear to be a candidate for outpatient chemotherapy.  He agrees to a referral for home hospice care.  I was present for greater than 50% of today's visit.  I performed medical decision making.  [name], MD

## 2021-07-05 ENCOUNTER — Telehealth: Payer: Self-pay | Admitting: *Deleted

## 2021-07-05 ENCOUNTER — Ambulatory Visit: Payer: Medicare HMO | Admitting: Nurse Practitioner

## 2021-07-05 ENCOUNTER — Encounter: Payer: Self-pay | Admitting: Oncology

## 2021-07-05 ENCOUNTER — Ambulatory Visit: Payer: Self-pay | Admitting: Nurse Practitioner

## 2021-07-05 ENCOUNTER — Encounter: Payer: Self-pay | Admitting: Nurse Practitioner

## 2021-07-05 ENCOUNTER — Other Ambulatory Visit: Payer: Medicare HMO

## 2021-07-05 ENCOUNTER — Other Ambulatory Visit: Payer: Self-pay

## 2021-07-05 ENCOUNTER — Ambulatory Visit: Payer: Self-pay

## 2021-07-05 NOTE — Telephone Encounter (Signed)
Per [facility] nurse: Having projectile vomiting now and nothing is staying down. Pain not controlled and can't keep med down. Requesting the following: Pain pump Suppository for nausea

## 2021-07-05 NOTE — Telephone Encounter (Signed)
Notified nurse Almyra Free that Dr. Benay Spice feels this is too much of a change since he was seen in office yesterday. Needs to be seen by MD before transition from Tramadol to morphine. Dr. Benay Spice is requesting Dr. Lyman Speller to visit home today and assess patient.

## 2021-07-09 ENCOUNTER — Encounter: Payer: Self-pay | Admitting: Family Medicine

## 2021-07-09 ENCOUNTER — Encounter: Payer: Self-pay | Admitting: Oncology

## 2021-07-09 ENCOUNTER — Encounter: Payer: Self-pay | Admitting: Nurse Practitioner

## 2021-07-09 ENCOUNTER — Telehealth: Payer: Self-pay

## 2021-07-11 ENCOUNTER — Telehealth: Payer: Self-pay | Admitting: Family Medicine

## 2021-07-11 NOTE — Telephone Encounter (Signed)
Received notification of [name] death.  Called spouse Nathan Mora to offer condolences on his passing.  Let her know that I was honored to be able to take care of him, and sad to see that he had passed.  Asked if any needs from [place] at this time, declined, has good support system in place.  Advised to reach out if we can be of any assistance.  Thanked me for my call.

## 2021-07-12 ENCOUNTER — Inpatient Hospital Stay: Payer: Medicare HMO

## 2021-07-19 ENCOUNTER — Other Ambulatory Visit: Payer: Medicare HMO

## 2021-07-19 ENCOUNTER — Ambulatory Visit: Payer: Medicare HMO

## 2021-07-25 ENCOUNTER — Ambulatory Visit: Payer: Self-pay | Admitting: Oncology

## 2021-07-27 NOTE — Telephone Encounter (Signed)
Correspondence from Pt's wife and [facility] Pt passed away this morning.

## 2021-07-27 NOTE — Telephone Encounter (Signed)
Pt wife reports pt passed in his sleep last night.

## 2021-07-27 DEATH — deceased

## 2021-08-19 ENCOUNTER — Other Ambulatory Visit: Payer: Self-pay

## 2021-10-09 ENCOUNTER — Encounter: Payer: Self-pay | Admitting: Oncology

## 2022-02-04 ENCOUNTER — Telehealth: Payer: Self-pay | Admitting: *Deleted

## 2022-02-04 NOTE — Patient Outreach (Signed)
  Care Coordination   02/04/2022 Name: Nathan Mora MRN: [id number] DOB: 1945-08-08   Care Coordination Outreach Attempts:  An unsuccessful telephone outreach was attempted today to offer the patient information about available care coordination services as a benefit of their health plan.   Follow Up Plan:  Additional outreach attempts will be made to offer the patient care coordination information and services.   Encounter Outcome:  No Answer   Care Coordination Interventions:  No, not indicated    Raina Mina, RN Care Management Coordinator [place] Office [phone]

## 2022-03-27 ENCOUNTER — Encounter: Payer: Self-pay | Admitting: Oncology

## 2022-03-27 ENCOUNTER — Telehealth: Payer: Self-pay | Admitting: Family Medicine

## 2022-03-27 NOTE — Telephone Encounter (Signed)
I attempted to leave patient a message to let him know his appointment for [facility] on 04/03/2022 was changed from 10:30 to 10:45. Patient doesn't have voice mail.

## 2022-04-03 ENCOUNTER — Ambulatory Visit: Payer: Medicare HMO

## 2022-04-10 IMAGING — DX DG ABDOMEN 2V
3 series · 3 of 3 positions shown · non-contrast
Comparison: CT 04/19/2020

CLINICAL DATA: diarrhea, abd pain, nausea, eval for stent
dysfunction

EXAM:
ABDOMEN - 2 VIEW

[abdomen erect]
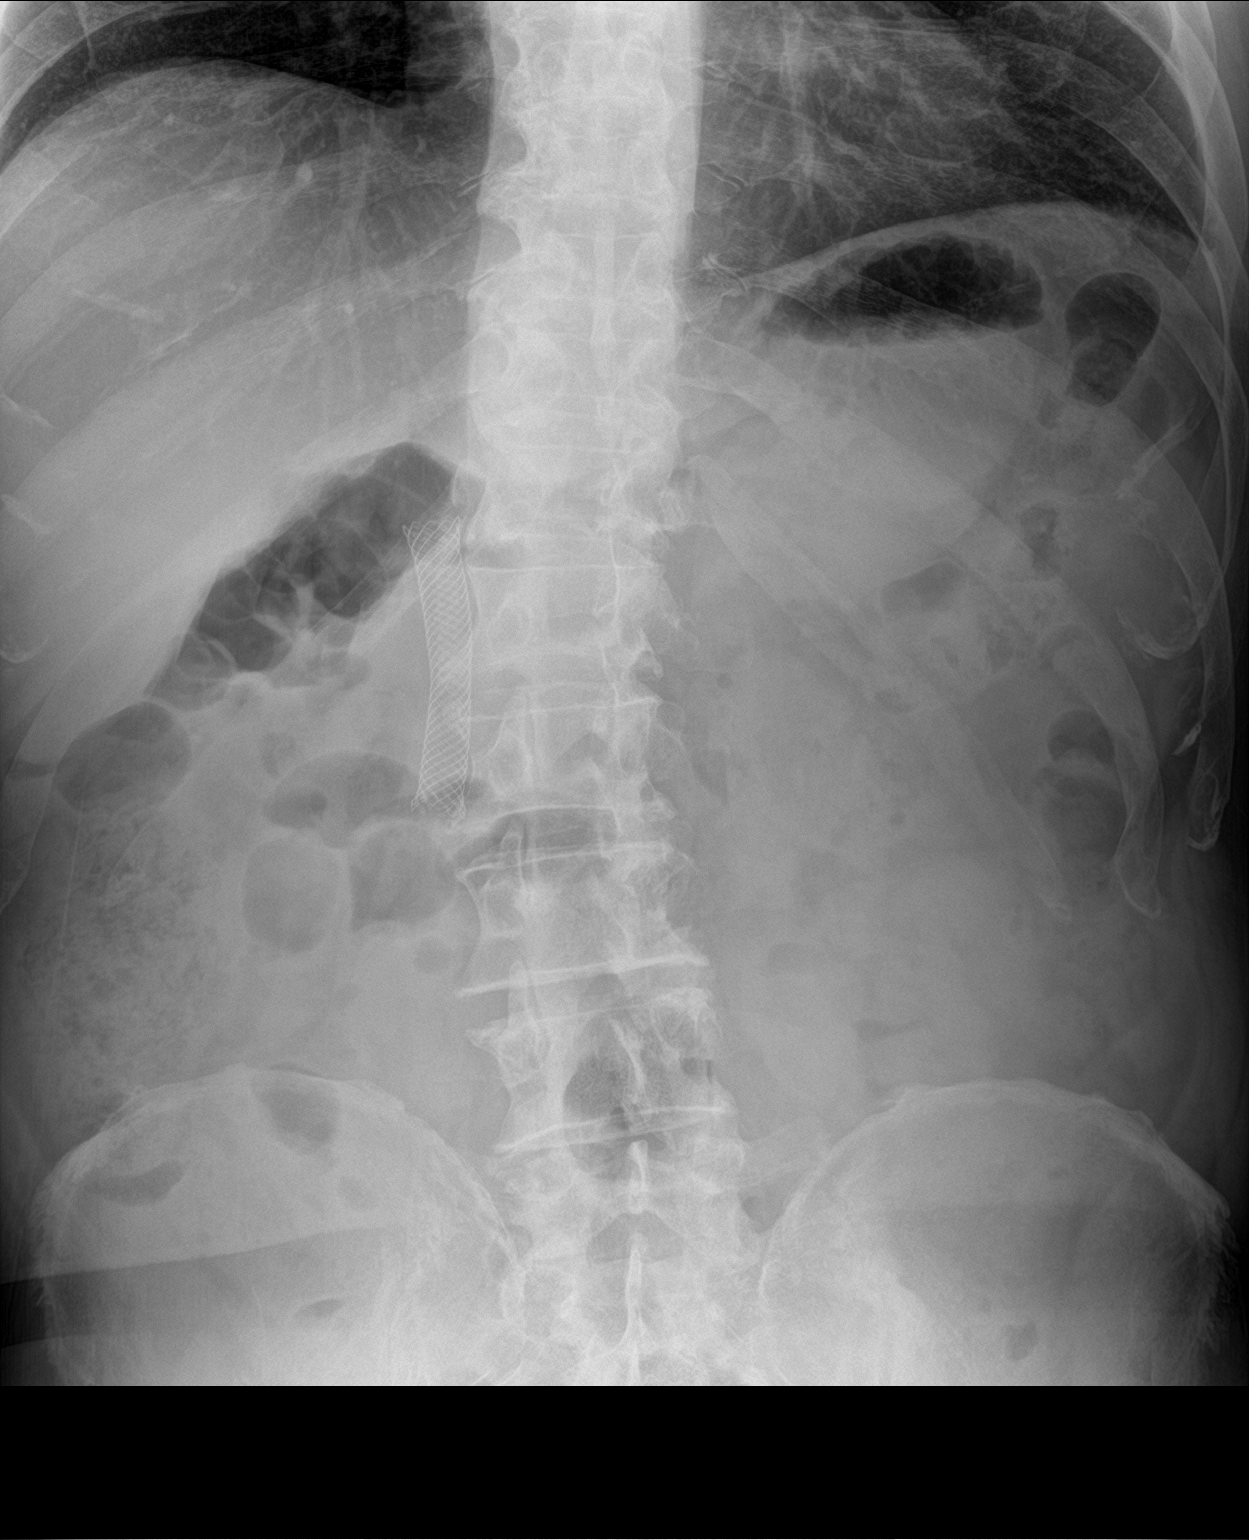

[abdomen supine (1 of 2)]
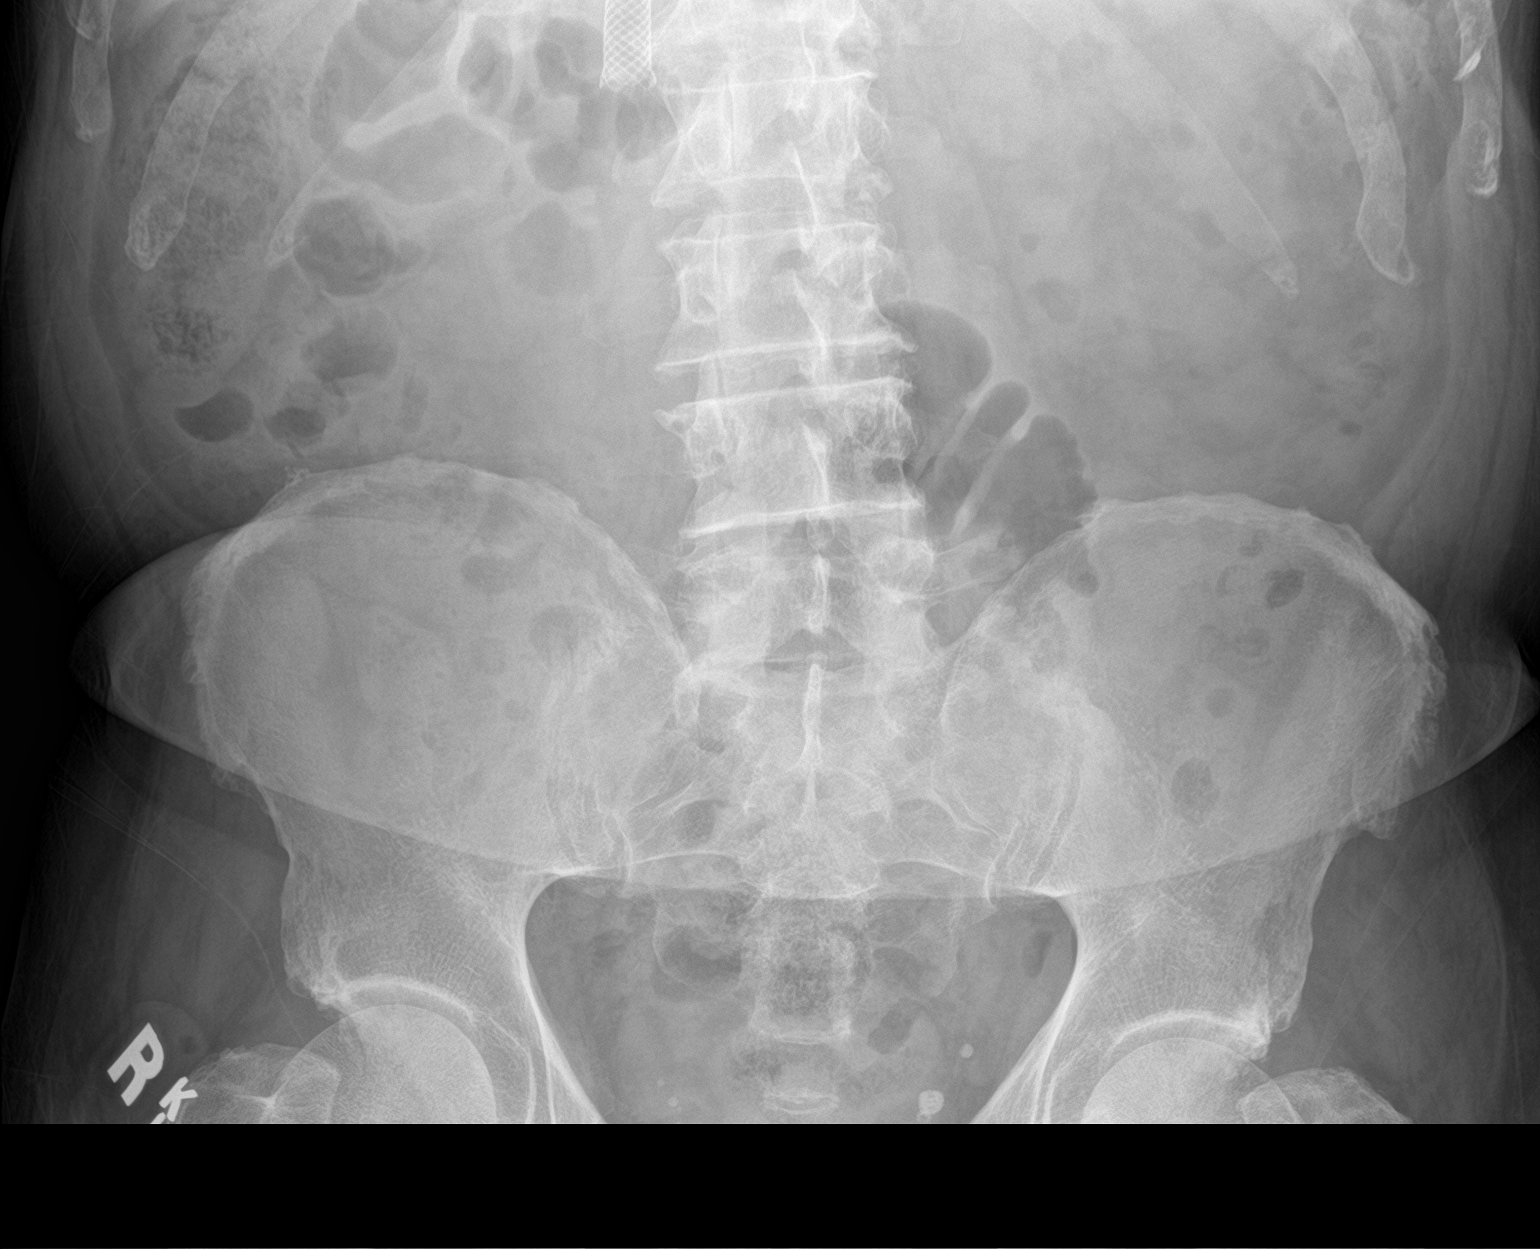

[abdomen supine (2 of 2)]
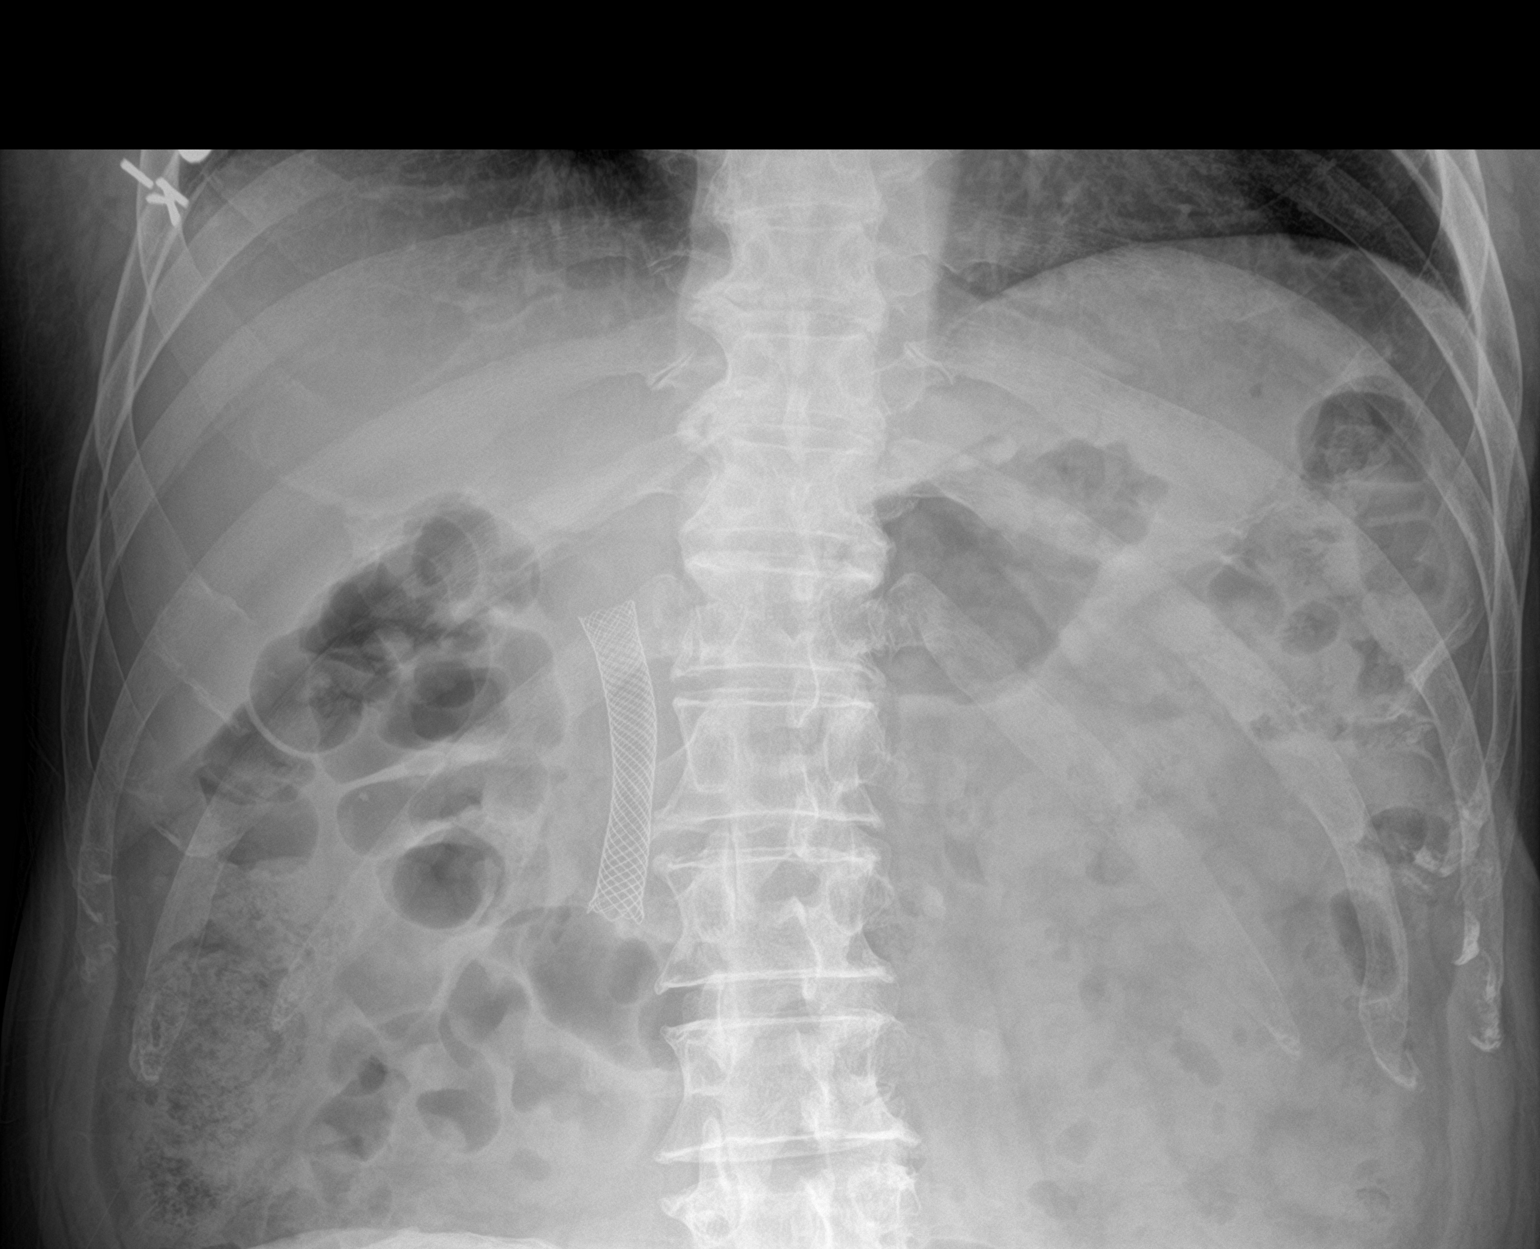

[3 of 3 positions shown; findings below may reference images not displayed]

FINDINGS: The bowel gas pattern is normal. Biliary stent is seen within the
right upper quadrant, grossly stable in positioning compared to the
previous CT. There is no evidence of free air. No radio-opaque
calculi or other significant radiographic abnormality is seen.
IMPRESSION: Nonobstructive bowel gas pattern. Unchanged positioning biliary
stent.

## 2022-06-04 ENCOUNTER — Telehealth: Payer: Self-pay

## 2022-09-29 IMAGING — CT CT ABD-PELV W/ CM
2 of 5 series · 15 of 46 positions shown, 17 images · IV contrast (APPLIED)
Comparison: 02/22/2021 from [facility]

CLINICAL DATA: Worsening lower extremity swelling. Pancreatic
carcinoma.

EXAM:
CT ABDOMEN AND PELVIS WITH CONTRAST
TECHNIQUE: Multidetector CT imaging of the abdomen and pelvis was performed
using the standard protocol following bolus administration of
intravenous contrast.

[Series 2: abd pel w · axial · 0.79mm/px · z∈[-584,-159]mm · 12 of 97 slices shown, 14 images]
[im 6/97  soft-tissue]
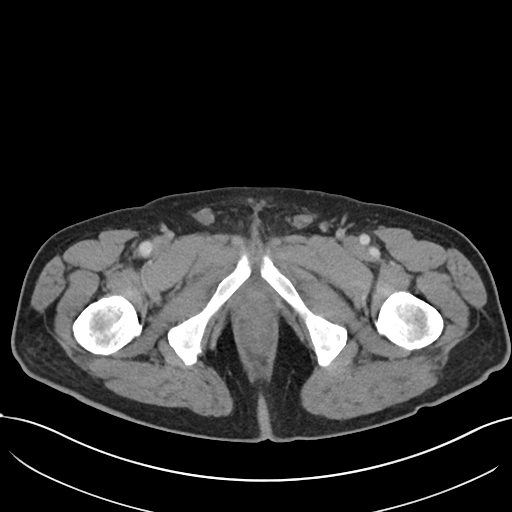
[im 6/97  bone]
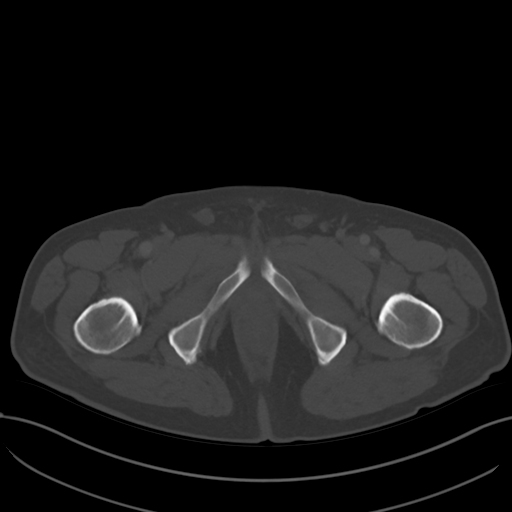
[im 16/97  soft-tissue]
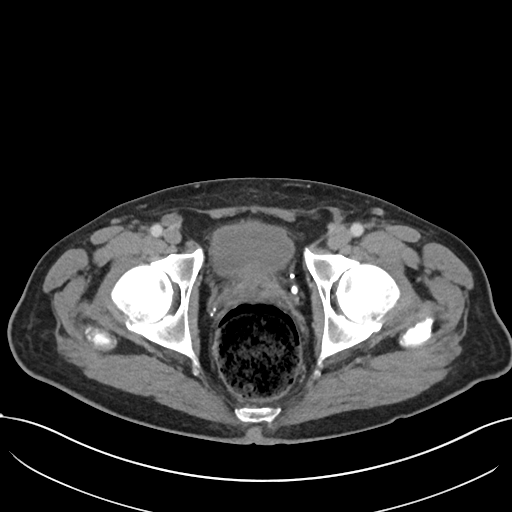
[im 21/97  soft-tissue]
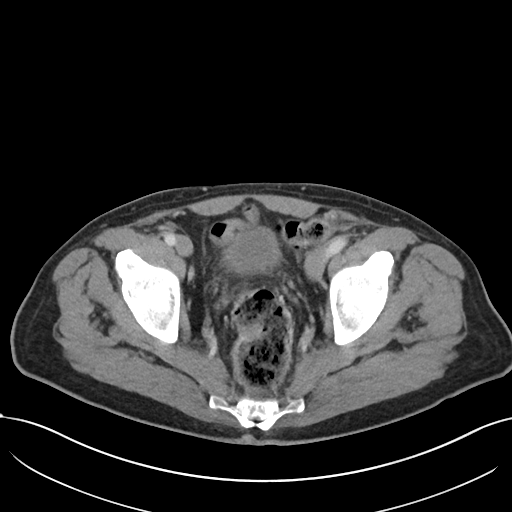
[im 31/97  soft-tissue]
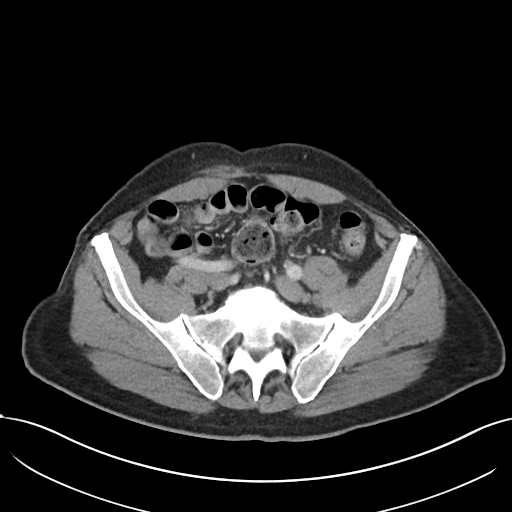
[im 36/97  soft-tissue]
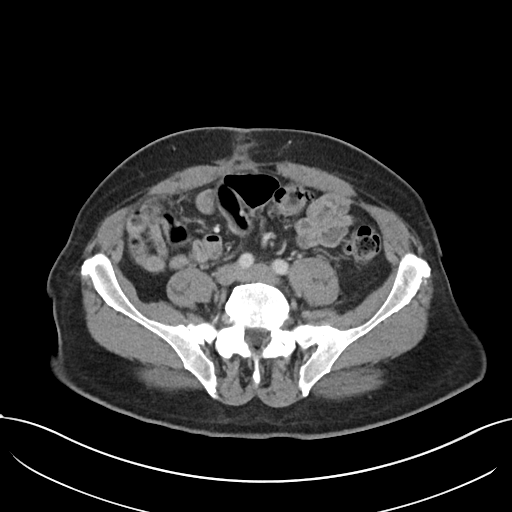
[im 46/97  soft-tissue]
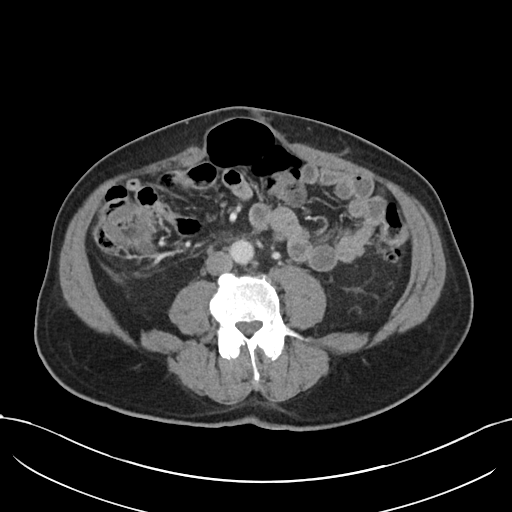
[im 51/97  soft-tissue]
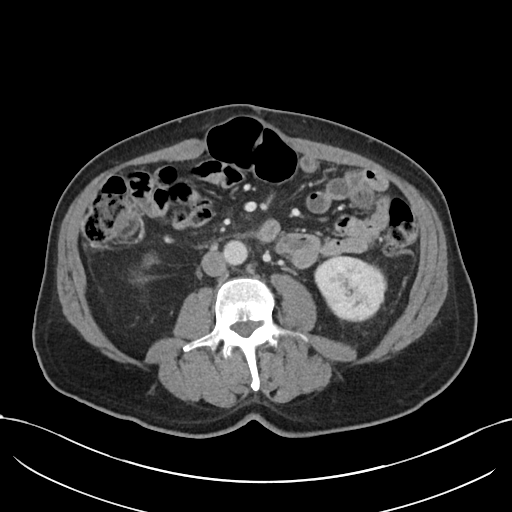
[im 61/97  soft-tissue]
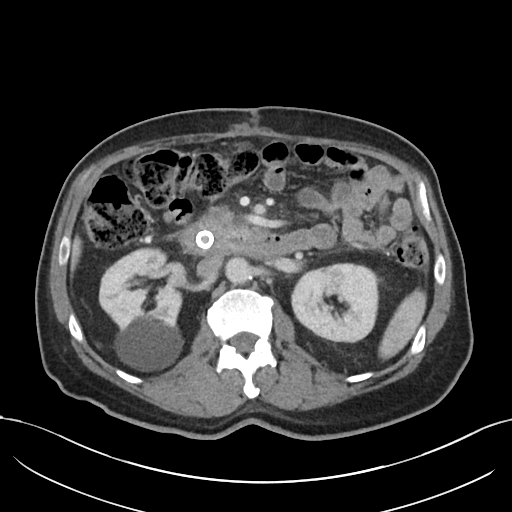
[im 66/97  soft-tissue]
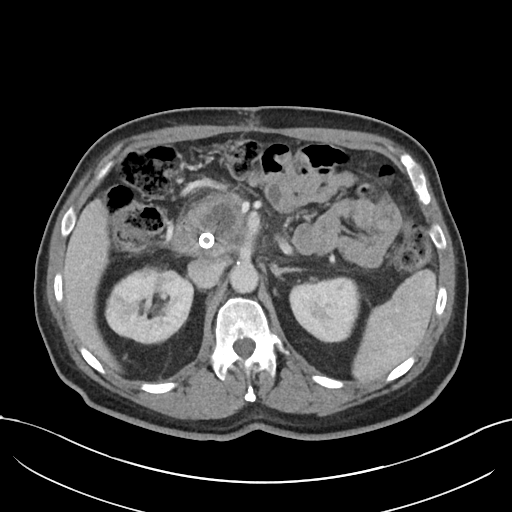
[im 66/97  bone]
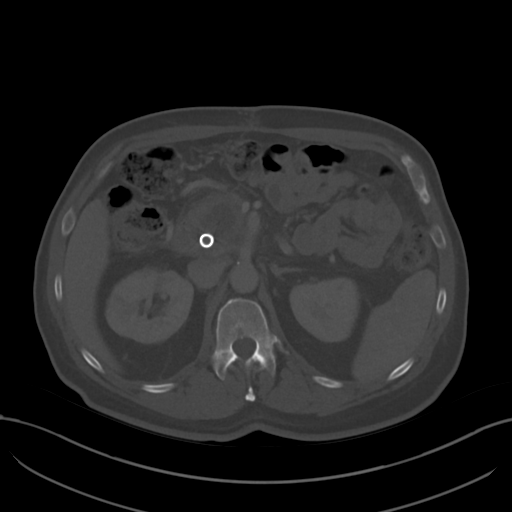
[im 76/97  soft-tissue]
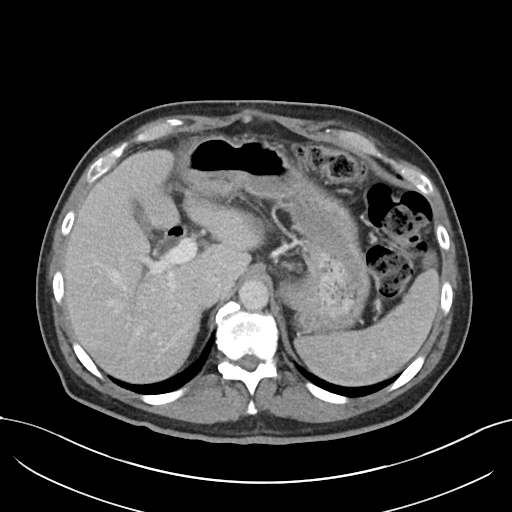
[im 81/97  soft-tissue]
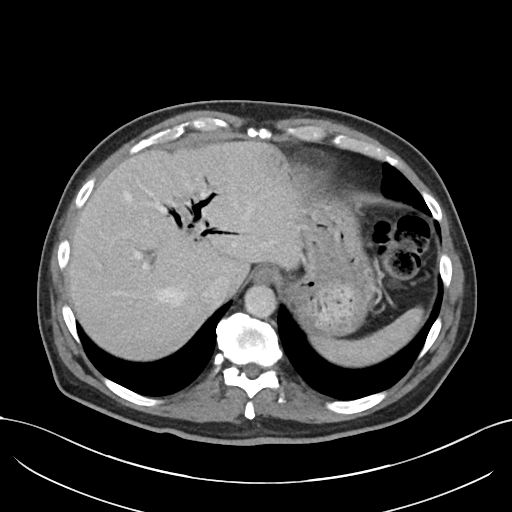
[im 91/97  soft-tissue]
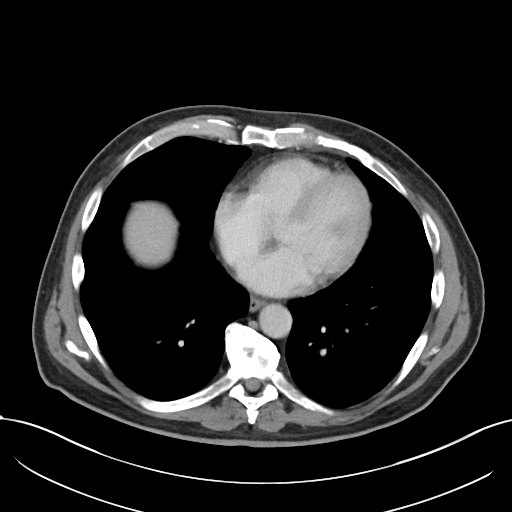

[Series 5: coronal · coronal · 0.69mm/px · 3 of 88 slices shown]
[im 30/88  soft-tissue]
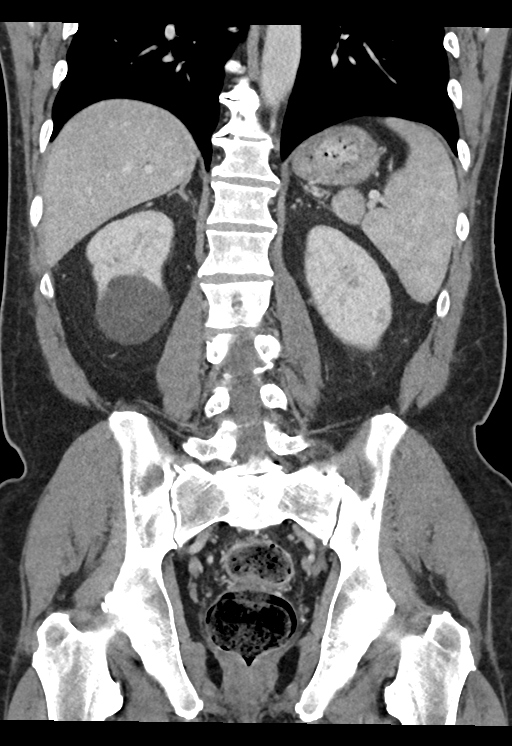
[im 39/88  soft-tissue]
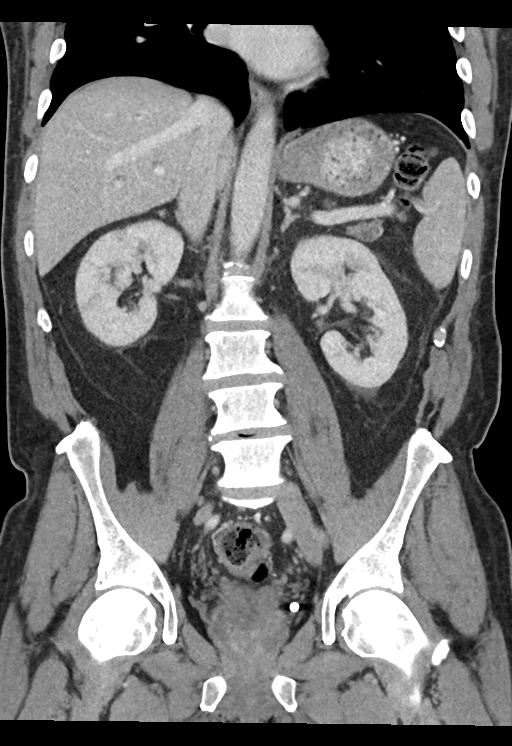
[im 49/88  soft-tissue]
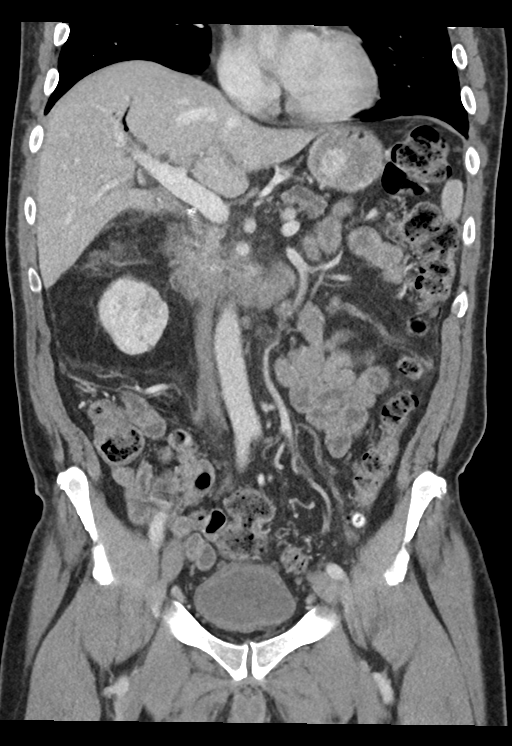

[15 of 46 positions shown; findings below may reference images not displayed]

RADIATION DOSE REDUCTION: This exam was performed according to the
departmental dose-optimization program which includes automated
exposure control, adjustment of the mA and/or kV according to
patient size and/or use of iterative reconstruction technique.

CONTRAST:  100mL OMNIPAQUE IOHEXOL 300 MG/ML  SOLN
FINDINGS: Lower Chest: No acute findings.

Hepatobiliary: No hepatic masses identified. Stent is again seen
within the common bile duct, and pneumobilia is again demonstrated.

Pancreas: A heterogeneous mass central necrosis is again seen in the
pancreatic head which measures 5.5 x 4.4 cm on image 33/2,, without
significant change compared to previous study. Diffuse pancreatic
ductal dilatation and atrophy is again seen in the body and tail.

Spleen: Within normal limits in size and appearance.

Adrenals/Urinary Tract: No masses identified. Simple right renal
cyst again noted. Several tiny less than 5 mm left renal calculi are
again seen. No evidence of ureteral calculi or hydronephrosis.

Stomach/Bowel: No evidence of obstruction, inflammatory process or
abnormal fluid collections.

Vascular/Lymphatic: No pathologically enlarged lymph nodes. No acute
vascular findings. No evidence of venous thrombosis in the abdomen
or pelvis.

Reproductive:  No mass or other significant abnormality.

Other:  None.  Stable rectus diastasis, without definite hernia.

Musculoskeletal:  No suspicious bone lesions identified.
IMPRESSION: No significant change in pancreatic head mass, consistent with known
pancreatic carcinoma.

No acute findings. No evidence of new or progressive disease within
the abdomen or pelvis.

Tiny nonobstructing left renal calculi.

## 2022-11-10 NOTE — Telephone Encounter (Signed)
error 

## 2022-12-17 NOTE — Telephone Encounter (Signed)
Telephone call  

## 2022-12-18 NOTE — Telephone Encounter (Signed)
Telephone call
